# Patient Record
Sex: Female | Born: 1940 | Race: White | Hispanic: No | Marital: Married | Smoking: Former smoker
Health system: Southern US, Community
[De-identification: ages and names within clinical notes are randomized; demographics above are authoritative.]

## PROBLEM LIST (undated history)

## (undated) DIAGNOSIS — H01129 Discoid lupus erythematosus of unspecified eye, unspecified eyelid: Secondary | ICD-10-CM

## (undated) DIAGNOSIS — Z5189 Encounter for other specified aftercare: Secondary | ICD-10-CM

## (undated) DIAGNOSIS — IMO0002 Reserved for concepts with insufficient information to code with codable children: Secondary | ICD-10-CM

## (undated) DIAGNOSIS — T4145XA Adverse effect of unspecified anesthetic, initial encounter: Secondary | ICD-10-CM

## (undated) DIAGNOSIS — R51 Headache: Secondary | ICD-10-CM

## (undated) DIAGNOSIS — N289 Disorder of kidney and ureter, unspecified: Secondary | ICD-10-CM

## (undated) DIAGNOSIS — I10 Essential (primary) hypertension: Secondary | ICD-10-CM

## (undated) DIAGNOSIS — I509 Heart failure, unspecified: Secondary | ICD-10-CM

## (undated) DIAGNOSIS — I671 Cerebral aneurysm, nonruptured: Secondary | ICD-10-CM

## (undated) DIAGNOSIS — J189 Pneumonia, unspecified organism: Secondary | ICD-10-CM

## (undated) DIAGNOSIS — I499 Cardiac arrhythmia, unspecified: Secondary | ICD-10-CM

## (undated) DIAGNOSIS — E039 Hypothyroidism, unspecified: Secondary | ICD-10-CM

## (undated) DIAGNOSIS — E7800 Pure hypercholesterolemia, unspecified: Secondary | ICD-10-CM

## (undated) DIAGNOSIS — G4730 Sleep apnea, unspecified: Secondary | ICD-10-CM

## (undated) DIAGNOSIS — M329 Systemic lupus erythematosus, unspecified: Secondary | ICD-10-CM

## (undated) HISTORY — DX: Headache: R51

## (undated) HISTORY — PX: BACK SURGERY: SHX140

## (undated) HISTORY — DX: Encounter for other specified aftercare: Z51.89

## (undated) HISTORY — DX: Heart failure, unspecified: I50.9

## (undated) HISTORY — DX: Reserved for concepts with insufficient information to code with codable children: IMO0002

## (undated) HISTORY — DX: Discoid lupus erythematosus of unspecified eye, unspecified eyelid: H01.129

## (undated) HISTORY — DX: Cardiac arrhythmia, unspecified: I49.9

## (undated) HISTORY — DX: Cerebral aneurysm, nonruptured: I67.1

## (undated) HISTORY — DX: Sleep apnea, unspecified: G47.30

## (undated) HISTORY — DX: Pneumonia, unspecified organism: J18.9

## (undated) HISTORY — PX: TYMPANIC MEMBRANE REPAIR: SHX294

## (undated) HISTORY — PX: BREAST EXCISIONAL BIOPSY: SUR124

## (undated) HISTORY — DX: Systemic lupus erythematosus, unspecified: M32.9

## (undated) HISTORY — PX: NASAL SINUS SURGERY: SHX719

## (undated) HISTORY — DX: Adverse effect of unspecified anesthetic, initial encounter: T41.45XA

## (undated) HISTORY — DX: Essential (primary) hypertension: I10

## (undated) HISTORY — PX: EYE SURGERY: SHX253

## (undated) HISTORY — DX: Hypothyroidism, unspecified: E03.9

## (undated) HISTORY — PX: COSMETIC SURGERY: SHX468

## (undated) HISTORY — DX: Pure hypercholesterolemia, unspecified: E78.00

---
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
## 1973-08-21 HISTORY — PX: OTHER SURGICAL HISTORY: SHX169

## 1986-08-21 HISTORY — PX: ABDOMINAL HYSTERECTOMY: SHX81

## 1988-08-21 HISTORY — PX: EXCISION MORTON'S NEUROMA: SHX5013

## 1999-09-22 LAB — HM COLONOSCOPY: HM Colonoscopy: NORMAL

## 2003-08-22 HISTORY — PX: BRAIN SURGERY: SHX531

## 2006-08-21 HISTORY — PX: COLONOSCOPY: SHX174

## 2006-10-30 ENCOUNTER — Ambulatory Visit: Payer: Self-pay | Admitting: Pain Medicine

## 2006-11-02 ENCOUNTER — Emergency Department: Payer: Self-pay | Admitting: Unknown Physician Specialty

## 2006-11-29 ENCOUNTER — Ambulatory Visit: Payer: Self-pay | Admitting: Specialist

## 2006-12-03 LAB — HM COLONOSCOPY: HM Colonoscopy: NORMAL

## 2007-02-07 ENCOUNTER — Ambulatory Visit: Payer: Self-pay | Admitting: Specialist

## 2007-06-06 ENCOUNTER — Ambulatory Visit (HOSPITAL_COMMUNITY): Admission: RE | Admit: 2007-06-06 | Discharge: 2007-06-06 | Payer: Self-pay | Admitting: Neurosurgery

## 2007-08-08 IMAGING — CR DG ANKLE COMPLETE 3+V*L*
1 series · 5 of 5 positions shown · non-contrast
Comparison: none

REASON FOR EXAM: Pain after fall - Call results to [phone] [REDACTED]
COMMENTS:

PROCEDURE:     DXR - DXR ANKLE LEFT COMPLETE  - November 29, 2006  [DATE]
RESULT:     Compared to a prior exam of 11/02/2006, no significant interval
changes are seen. No fracture, dislocation, or other acute bony abnormality
is identified. The ankle mortise is well maintained.

[Series 1: view not recorded · 0.17mm/px · 5 of 5 slices shown]
[im 1/5]
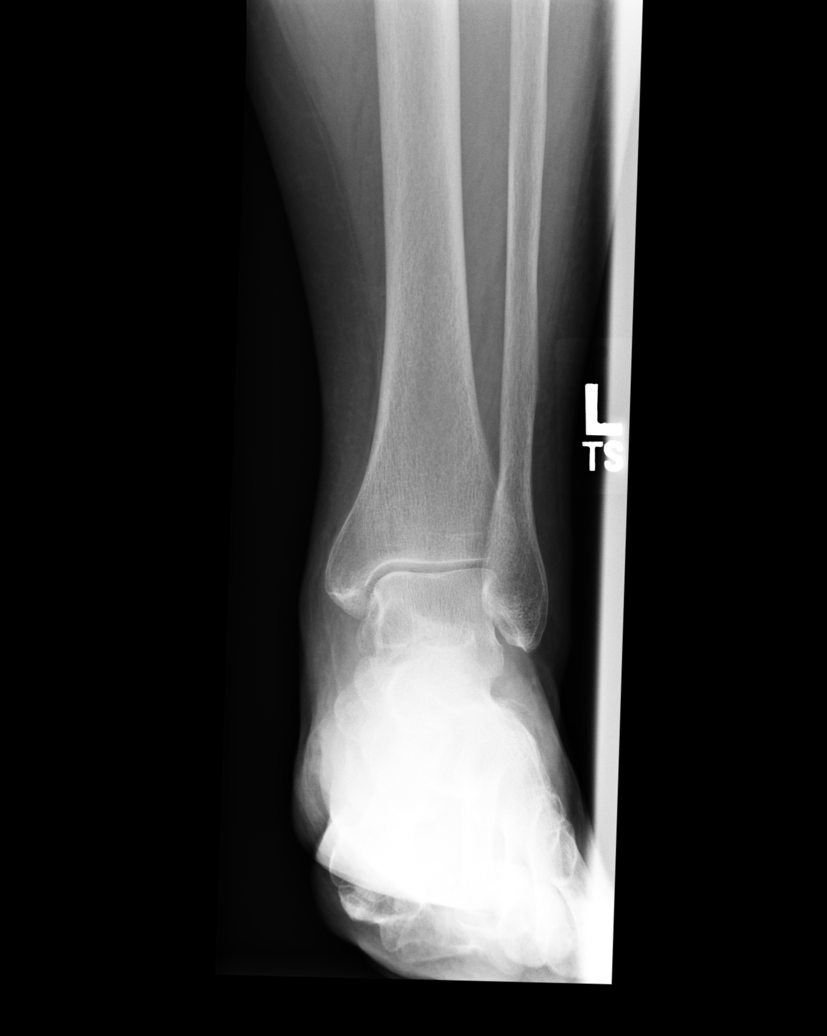
[im 2/5]
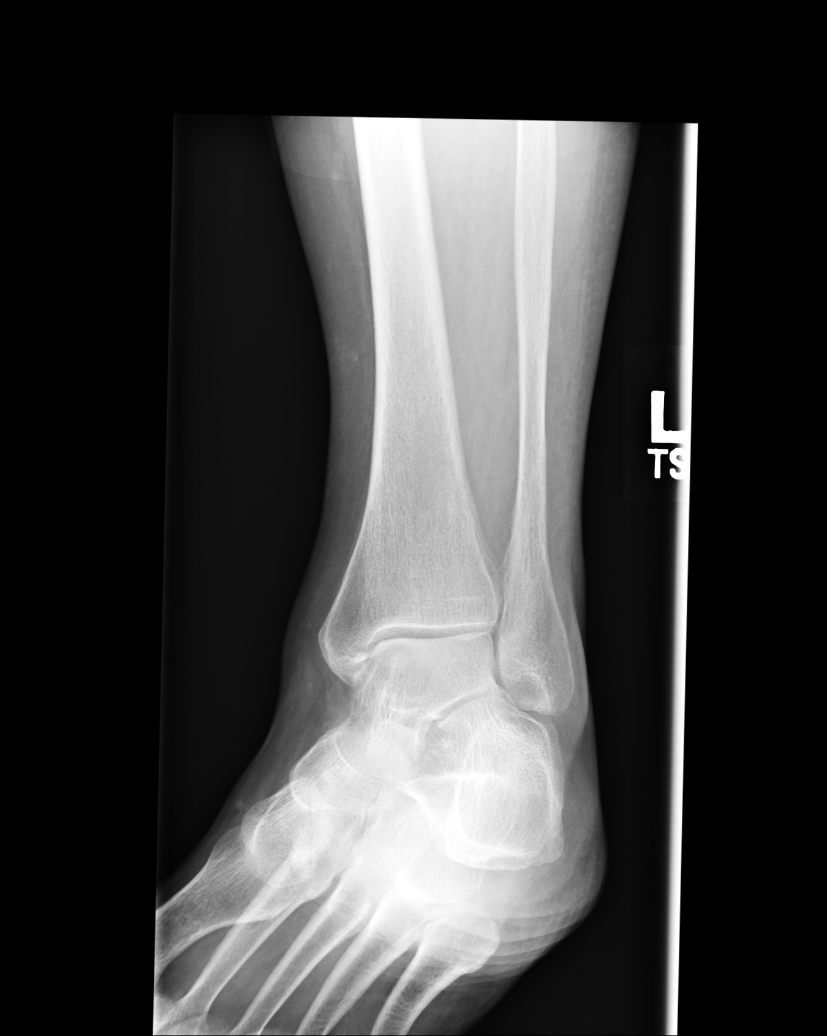
[im 3/5]
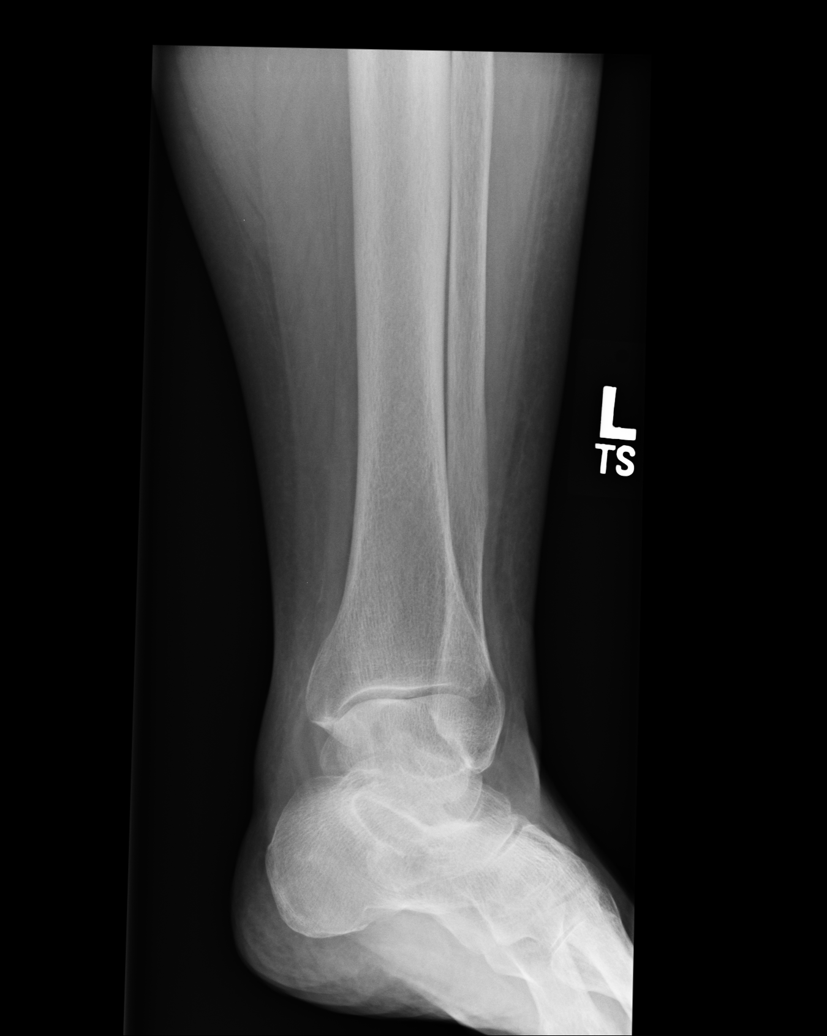
[im 4/5]
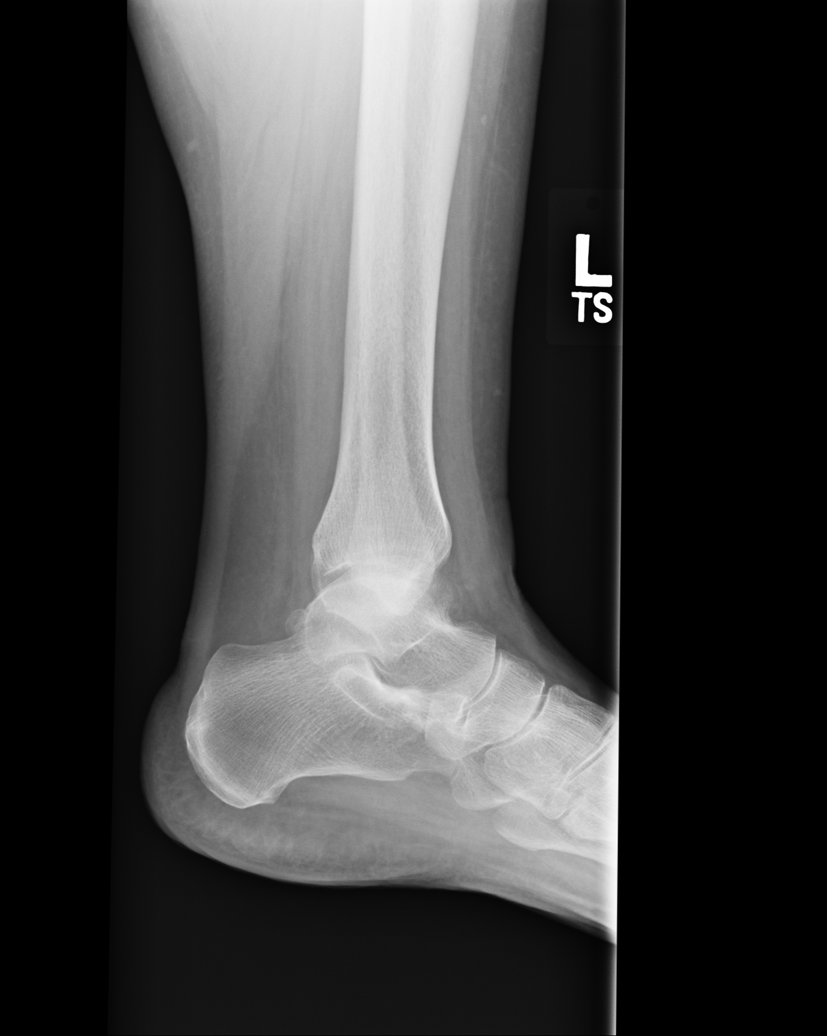
[im 5/5]
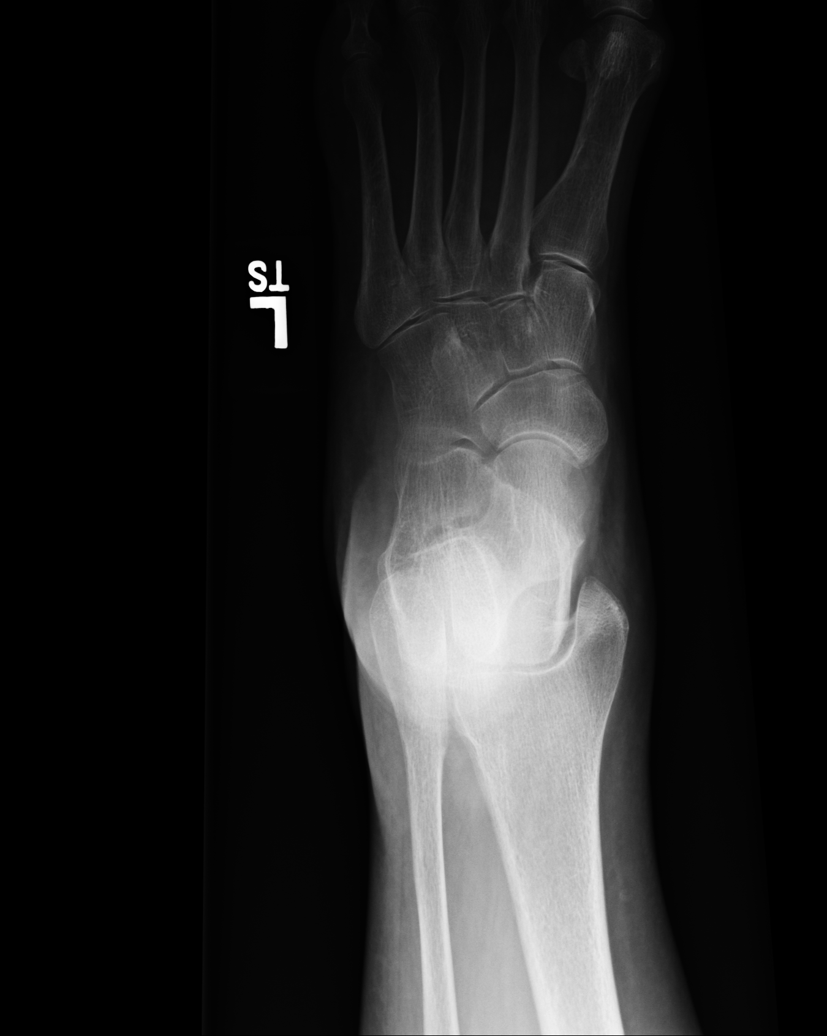

[5 of 5 positions shown; findings below may reference images not displayed]

IMPRESSION: 1.     No significant osseous abnormalities are identified.

## 2007-08-22 DIAGNOSIS — IMO0001 Reserved for inherently not codable concepts without codable children: Secondary | ICD-10-CM

## 2007-08-22 DIAGNOSIS — Z5189 Encounter for other specified aftercare: Secondary | ICD-10-CM

## 2007-08-22 HISTORY — DX: Encounter for other specified aftercare: Z51.89

## 2007-08-22 HISTORY — DX: Reserved for inherently not codable concepts without codable children: IMO0001

## 2007-11-07 DIAGNOSIS — E7800 Pure hypercholesterolemia, unspecified: Secondary | ICD-10-CM | POA: Insufficient documentation

## 2007-11-28 ENCOUNTER — Inpatient Hospital Stay (HOSPITAL_COMMUNITY): Admission: RE | Admit: 2007-11-28 | Discharge: 2007-12-03 | Payer: Self-pay | Admitting: Neurosurgery

## 2007-12-03 ENCOUNTER — Ambulatory Visit: Payer: Self-pay | Admitting: Surgery

## 2007-12-03 ENCOUNTER — Encounter (INDEPENDENT_AMBULATORY_CARE_PROVIDER_SITE_OTHER): Payer: Self-pay | Admitting: Neurosurgery

## 2008-02-18 ENCOUNTER — Observation Stay: Payer: Self-pay | Admitting: Specialist

## 2008-10-09 ENCOUNTER — Ambulatory Visit: Payer: Self-pay | Admitting: Family

## 2009-04-08 ENCOUNTER — Ambulatory Visit: Payer: Self-pay | Admitting: Family

## 2010-04-06 ENCOUNTER — Ambulatory Visit: Payer: Self-pay | Admitting: Internal Medicine

## 2010-05-21 DIAGNOSIS — I509 Heart failure, unspecified: Secondary | ICD-10-CM

## 2010-05-21 HISTORY — DX: Heart failure, unspecified: I50.9

## 2010-05-30 DIAGNOSIS — T8859XA Other complications of anesthesia, initial encounter: Secondary | ICD-10-CM

## 2010-05-30 DIAGNOSIS — J189 Pneumonia, unspecified organism: Secondary | ICD-10-CM

## 2010-05-30 HISTORY — DX: Pneumonia, unspecified organism: J18.9

## 2010-05-30 HISTORY — DX: Other complications of anesthesia, initial encounter: T88.59XA

## 2010-06-29 ENCOUNTER — Inpatient Hospital Stay: Payer: Self-pay | Admitting: Internal Medicine

## 2010-09-23 ENCOUNTER — Ambulatory Visit: Payer: Self-pay | Admitting: Specialist

## 2010-10-18 ENCOUNTER — Encounter: Payer: Self-pay | Admitting: Internal Medicine

## 2010-10-20 ENCOUNTER — Encounter: Payer: Self-pay | Admitting: Internal Medicine

## 2011-01-03 NOTE — Op Note (Signed)
[name], [name]                   ACCOUNT NO.:  [id number]   MEDICAL RECORD NO.:  [id number]          PATIENT TYPE:  INP   LOCATION:  3308                         FACILITY:  [facility]   PHYSICIAN:  Cristi Loron, M.D.DATE OF BIRTH:  1941-06-01   DATE OF PROCEDURE:  11/28/2007  DATE OF DISCHARGE:                               OPERATIVE REPORT   BRIEF HISTORY:  The patient is a 70 year old white female who has  undergone L3-4 and L4-5 posterior decompression, instrumentation, and  fusion by another physician several years ago.  The patient has had  chronic back pain.  She had worsening of her pain with right leg pain.  She failed medical management and was worked up with lumbar x-rays and  myelo-CT, which demonstrated the patient had likely developed  pseudoarthrosis at L3-4 and L4-5 and then developed adjacent segment  degenerative changes at L2-3 with stenosis.  I discussed various  treatment options with the patient including surgery.  The patient has  weighed the risks, benefits, and alternatives of surgery and decided to  proceed with lumbar decompression, instrumentation, and fusion and  exploration of her arthrodesis.   PREOPERATIVE DIAGNOSES:  1. L3-4, L4-5 disk degeneration and stenosis.  2. Lumbar radiculopathy.  3. Lumbago.  4. Possible pseudoarthrosis.   POSTOPERATIVE DIAGNOSES:  1. L3-4, L4-5 disk degeneration and stenosis.  2. Lumbar radiculopathy.  3. Lumbago.  4. Possible pseudoarthrosis.   PROCEDURE:  Exploration of lumbar arthrodesis; redo decompressive  laminectomy at L2, L3, and L4 to decompress the right L2, L3, L4, and L5  nerve roots; right L2-3, L3-4, and L4-5 and transforaminal lumbar  interbody fusion with local autograft bone and VITOSS bone graft  extender; insertion of L2-3, L3-4, L4-5 interbody prosthesis (Capstone  PEEK interbody prosthesis); posterior segmental instrumentation at L2 to  L5 with Legacy titanium pedicle screws and rods; L2-3,  L3-4, and L4-5  posterolateral arthrodesis with local morselized autograft bone, VITOSS  bone graft extender as well as bone morphogenic protein-soaked collagen  sponges.   SURGEON:  Cristi Loron, M.D.   ASSISTANT:  Clydene Fake, M.D.   ANESTHESIA:  General endotracheal.   ESTIMATED BLOOD LOSS:  500 mL.   SPECIMENS:  None.   DRAINS:  None.   COMPLICATIONS:  None.   DESCRIPTION OF PROCEDURE:  The patient was brought to the operating room  by the anesthesia team.  General endotracheal anesthesia was induced.  The patient was turned to the prone position on the Wilson frame.  Her  lumbosacral region was then prepared with Betadine scrub and Betadine  solution.  Sterile drapes were applied.  I then injected the area to be  incised with Marcaine with epinephrine solution.  I used a scalpel to  make a linear midline incision through the patient's previous surgical  scar.  I used electrocautery to perform a bilateral subperiosteal  dissection exposing spinous process and lamina of L2 in the upper sacrum  as well as exposing the old hardware.  We then used the universal  removal set to remove the old synergy pedicle screws and  rods.  Of note,  the screws at L3 and at L5 were very loose.  The L4 screws were only  minimally loose.  Clearly, the patient has pseudoarthrosis of both  levels.   We now turned attention to the decompression.  I used a high-speed drill  to perform a right L2 laminotomy.  We then widened the laminotomy with  Kerrison punch removing the right L2-3 ligamentum flavum.  We carefully  dissected through the epidural scar tissue from prior operation and  removed the epidural scar tissue and then decompressed the thecal sac.  Because the patient had persistent right leg pain, we then performed  foraminotomies about the right L2, L3, L4, and L5 nerve roots.  Of note,  the extent of decompression was greater than which required to do the  interbody fusion.  We  did encounter the expected foraminal stenosis.   Having completed the decompression, we now turned our attention to  arthrodesis.  We incised the L2-3, L3-4, and L4-5 intervertebral disk  with a scalpel .  We performed partial intervertebral diskectomy with  the pituitary forceps.  We prepared the vertebral endplates for the  interbody fusion using the curettes.  We then used trial spacers and  determined to use a 12 x 26-mm interbody spacer at L2-3 and L4-5, a 10 x  26 at L3-4.  We prefilled the spacer with a combination of a BMP-soaked  collagen sponge, VITOSS bone graft extender as well as local autograft  bone.  We used these substances to fill in the ventral disk space as  well.  We placed prosthesis into the disk space on the right at L2-3, L3-  4, and L4-5, of course after retracting the neural structures out of  harm's way.  We used a tamp to turn the prosthesis laterally at L2-3 and  L4-5 before the prosthesis was too snug to turn in laterally.  We then  filled posterior disk space with VITOSS local autograft bone and bone  morphogenic protein-soaked collagen sponges completing the  transforaminal lumbar fusion and interbody fusion at L2-3, L3-4, and L4-  5.   We now turned our attention to the instrumentation.  Under fluoroscopic  guidance, we cannulated bilateral L2 pedicles with bone probe.  We  tapped the pedicles with 5.5-mm tap and then inserted 7.5 x 55-mm tap  screws bilaterally at L2.  We then reinserted 8.5 x 55 mm tap screws  bilaterally at L3 and L5 and 7.5 x 55 bilaterally at L4.  We got good  purchase in the old screw holes.  We then connected the unilateral  screws with a lordotic rod.  We expressed the rod in place using the  capsular tightening probe which we tightened appropriately, and then we  placed across the connector between the rods again tightening it  appropriately to complete the instrumentation.   We now turned our attention to the posterolateral  arthrodesis.  We used  a high-speed drill to decorticate the remainder of the L2-3, L3-4, and  L4-5 facets at pars region and transverse processes.  We laid a  combination of local autograft bone, VITOSS, and bone morphogenic  protein-soaked collagen sponge over these corticated posterior  structures completing the posterolateral arthrodesis.   We then obtained hemostasis using bipolar cautery.  We inspected the  right thecal sac at L2-3, L3-4, and L4-5 as well as palpated along the  exit route of the right L2, L3, L4, and L5 nerve roots and noted they  were well decompressed.  We then removed the retractor and then  reapproximated the patient's thoracolumbar fascia with interrupted #1  Vicryl suture, subcutaneous tissue with interrupted 2-0 Vicryl suture,  and skin with Steri-Strips and benzoin.  The wound was then coated with  Bacitracin ointment.  Sterile dressings applied.  Drapes were removed.  The patient was subsequently returned to supine position where she was  extubated by the anesthesia team and transported to the postanesthesia  care unit in stable condition.  All sponge, instrument and needle counts  were correct in this case.      Cristi Loron, M.D.  Electronically Signed     JDJ/MEDQ  D:  11/28/2007  T:  11/29/2007  Job:  [id number]

## 2011-01-06 NOTE — Discharge Summary (Signed)
[name], [name]                   ACCOUNT NO.:  [id number]   MEDICAL RECORD NO.:  [id number]          PATIENT TYPE:  INP   LOCATION:  3009                         FACILITY:  [facility]   PHYSICIAN:  Cristi Loron, M.D.DATE OF BIRTH:  1940/11/03   DATE OF ADMISSION:  11/28/2007  DATE OF DISCHARGE:  12/03/2007                               DISCHARGE SUMMARY   BRIEF HISTORY:  The patient is a 70 year old white female who has  undergone an L3-4, L4-5 posterior decompression, instrumentation, and  fusion by another physician several years ago.  The patient has had  chronic back pain.  She has had worsening of her back pain with right  leg pain.  She failed medical management and was worked up with lumbar x-  rays and myelo-CT demonstrated the patient had developed pseudoarthrosis  at L3-4 and L4-5, and that she had developed adjacent secondary  degenerative changes at L2-3 with stenosis.  I discussed the various  treatment options with the patient including the surgery.  The patient  is aware of the risks, benefits, and alternatives to surgery, and  decided to proceed with a lumbar decompression, instrumentation, fusion,  and exploration of arthrodesis.   For further details of this admission, please refer to typed history and  physical.   HOSPITAL COURSE:  I admitted the patient to [facility] on November 28, 2007.  On the day of admission, I performed exploration of the lumbar  fusion with a L2-3, L3-4, L4-5 decompression, instrumentation, and  fusion.  Surgery went well (for full details of this operation, please  refer to the typed operative note.   POSTOPERATIVE COURSE:  The patient's postoperative course was  unremarkable.  Given history of DVTs, before the patient was discharged,  she had a bilateral lower extremity ultrasound which was negative for  DVT.  The patient was discharged to home on December 03, 2007.   DISCHARGE INSTRUCTIONS:  This patient was given written  discharge  instructions.  Instructed to follow up with me in 4 weeks.   FINAL DIAGNOSES:  1. 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  2. L2-L3 disc degeneration.  3. Stenosis.  4. Lumbar radiculopathy.  5. Lumbago.   PROCEDURE PERFORMED:  Exploration of lumbar arthrodesis; redo  decompressive laminectomy at L2, L3, and L4 to decompress the right L2,  L3, L4, and L5 nerve roots; right L2-3, L3-4, and L4-5 transforaminal  lumbar interbody fusion with local autograft bone and Vitoss bone graft  extender; insertion of L2-3, L3-4, L4-5 interbody  prosthesis (Capstone PEEK interbody prosthesis); posterior segmental  instrumentation at L2-L5 with Legacy titanium pedicle screws and rods;  L2-3, L3-4, and L4-5 posterolateral arthrodesis with local morselized  autograft bone and Vitoss bone graft extender, as well as bone  morphogenic protein soaked collagen sponges.      Cristi Loron, M.D.  Electronically Signed     JDJ/MEDQ  D:  12/26/2007  T:  12/27/2007  Job:  [id number]

## 2011-02-15 ENCOUNTER — Ambulatory Visit: Payer: Self-pay | Admitting: Internal Medicine

## 2011-02-28 ENCOUNTER — Ambulatory Visit: Payer: Self-pay | Admitting: Internal Medicine

## 2011-03-28 ENCOUNTER — Encounter: Payer: Self-pay | Admitting: Internal Medicine

## 2011-04-07 ENCOUNTER — Encounter: Payer: Self-pay | Admitting: Internal Medicine

## 2011-04-07 DIAGNOSIS — I10 Essential (primary) hypertension: Secondary | ICD-10-CM

## 2011-04-18 ENCOUNTER — Ambulatory Visit (INDEPENDENT_AMBULATORY_CARE_PROVIDER_SITE_OTHER): Payer: Self-pay | Admitting: Internal Medicine

## 2011-04-18 ENCOUNTER — Encounter: Payer: Self-pay | Admitting: Internal Medicine

## 2011-04-18 DIAGNOSIS — I10 Essential (primary) hypertension: Secondary | ICD-10-CM

## 2011-04-18 DIAGNOSIS — G8929 Other chronic pain: Secondary | ICD-10-CM

## 2011-04-18 DIAGNOSIS — R7309 Other abnormal glucose: Secondary | ICD-10-CM

## 2011-04-18 DIAGNOSIS — Z1239 Encounter for other screening for malignant neoplasm of breast: Secondary | ICD-10-CM | POA: Insufficient documentation

## 2011-04-18 DIAGNOSIS — M549 Dorsalgia, unspecified: Secondary | ICD-10-CM

## 2011-04-18 DIAGNOSIS — IMO0001 Reserved for inherently not codable concepts without codable children: Secondary | ICD-10-CM | POA: Insufficient documentation

## 2011-04-18 DIAGNOSIS — R739 Hyperglycemia, unspecified: Secondary | ICD-10-CM

## 2011-04-18 DIAGNOSIS — I671 Cerebral aneurysm, nonruptured: Secondary | ICD-10-CM | POA: Insufficient documentation

## 2011-04-18 DIAGNOSIS — R252 Cramp and spasm: Secondary | ICD-10-CM

## 2011-04-18 LAB — COMPREHENSIVE METABOLIC PANEL
Chloride: 106 mEq/L (ref 96–112)
Creatinine, Ser: 1.1 mg/dL (ref 0.4–1.2)
GFR: 55.36 mL/min — ABNORMAL LOW (ref >60.00)
Sodium: 140 mEq/L (ref 135–145)

## 2011-04-18 LAB — MAGNESIUM: Magnesium: 2 mg/dL (ref 1.5–2.5)

## 2011-04-18 LAB — HEMOGLOBIN A1C: Hgb A1c MFr Bld: 5.8 % (ref 4.6–6.5)

## 2011-04-18 MED ORDER — OXYCODONE-ACETAMINOPHEN 10-325 MG PO TABS: 1.0000 | ORAL_TABLET | Freq: Four times a day (QID) | ORAL | Status: DC | PRN

## 2011-04-18 NOTE — Assessment & Plan Note (Signed)
Continue Percocet for pain control.

## 2011-04-18 NOTE — Patient Instructions (Signed)
Your blood pressure is perfect. You can reduce your hydralazine to 50 mg if your blood pressure remains  < 130/80

## 2011-04-18 NOTE — Progress Notes (Signed)
  Subjective:    Patient ID: [name], female    DOB: 14-Nov-1940, 70 y.o.   MRN: [id number]  HPI Here for a blood pressure check.  Has weaned herself off of Catapres and bps are now runnign < 130/80 consistently on hydralazine 100 tid, losartan 100 qd, lopressor 100 bid and spironolactone    Review of Systems  Constitutional: Negative for fever, chills and unexpected weight change.  HENT: Negative for hearing loss, ear pain, nosebleeds, congestion, sore throat, facial swelling, rhinorrhea, sneezing, mouth sores, trouble swallowing, neck pain, neck stiffness, voice change, postnasal drip, sinus pressure, tinnitus and ear discharge.   Eyes: Negative for pain, discharge, redness and visual disturbance.  Respiratory: Negative for cough, chest tightness, shortness of breath, wheezing and stridor.   Cardiovascular: Negative for chest pain, palpitations and leg swelling.  Musculoskeletal: Negative for myalgias and arthralgias.  Skin: Negative for color change and rash.  Neurological: Negative for dizziness, weakness, light-headedness and headaches.  Hematological: Negative for adenopathy.       Objective:   Physical Exam  Constitutional: She is oriented to person, place, and time. She appears well-developed and well-nourished.  HENT:  Mouth/Throat: Oropharynx is clear and moist.  Eyes: EOM are normal. Pupils are equal, round, and reactive to light. No scleral icterus.  Neck: Normal range of motion. Neck supple. No JVD present. No thyromegaly present.  Cardiovascular: Normal rate, regular rhythm, normal heart sounds and intact distal pulses.   Pulmonary/Chest: Effort normal and breath sounds normal.  Abdominal: Soft. Bowel sounds are normal. She exhibits no mass. There is no tenderness.  Musculoskeletal: Normal range of motion. She exhibits no edema.  Lymphadenopathy:    She has no cervical adenopathy.  Neurological: She is alert and oriented to person, place, and time.  Skin: Skin is  warm and dry.  Psychiatric: She has a normal mood and affect.          Assessment & Plan:

## 2011-04-18 NOTE — Assessment & Plan Note (Signed)
Currently well controlled on hydralazine, metoprolol, losartan and spironolactone.  The hydralazine is causing increased fluid retention which is aggravating her lymphedema .  Will try to reduce the dose if possible without disturbing control of bp.

## 2011-04-19 ENCOUNTER — Encounter: Payer: Self-pay | Admitting: Internal Medicine

## 2011-04-19 ENCOUNTER — Other Ambulatory Visit: Payer: Self-pay | Admitting: Internal Medicine

## 2011-04-19 MED ORDER — ROSUVASTATIN CALCIUM 20 MG PO TABS: 20.0000 mg | ORAL_TABLET | Freq: Every day | ORAL | Status: DC

## 2011-05-09 ENCOUNTER — Other Ambulatory Visit: Payer: Self-pay | Admitting: Internal Medicine

## 2011-05-10 ENCOUNTER — Other Ambulatory Visit: Payer: Self-pay | Admitting: Internal Medicine

## 2011-05-10 MED ORDER — LOSARTAN POTASSIUM 100 MG PO TABS: 100.0000 mg | ORAL_TABLET | Freq: Every day | ORAL | Status: DC

## 2011-05-12 MED ORDER — LOSARTAN POTASSIUM 100 MG PO TABS: 100.0000 mg | ORAL_TABLET | Freq: Every day | ORAL | Status: DC

## 2011-05-15 ENCOUNTER — Other Ambulatory Visit: Payer: Self-pay | Admitting: Internal Medicine

## 2011-05-15 DIAGNOSIS — Z1239 Encounter for other screening for malignant neoplasm of breast: Secondary | ICD-10-CM

## 2011-05-16 LAB — CBC
HCT: 29.7 — ABNORMAL LOW
HCT: 36.9
Hemoglobin: 12.7
MCHC: 34.1
MCHC: 34.5
MCV: 95.7
MCV: 96.6
Platelets: 143 — ABNORMAL LOW
RBC: 3.08 — ABNORMAL LOW
RBC: 3.85 — ABNORMAL LOW

## 2011-05-16 LAB — BASIC METABOLIC PANEL
BUN: 11
CO2: 27
CO2: 29
Chloride: 103
Chloride: 104
Creatinine, Ser: 0.68
Creatinine, Ser: 0.9
GFR calc Af Amer: >60
Potassium: 4
Potassium: 4.2

## 2011-05-16 LAB — POCT I-STAT 4, (NA,K, GLUC, HGB,HCT)
Potassium: 3.8
Sodium: 142

## 2011-05-16 LAB — URINALYSIS, ROUTINE W REFLEX MICROSCOPIC
Bilirubin Urine: NEGATIVE
Glucose, UA: NEGATIVE
Hgb urine dipstick: NEGATIVE
Ketones, ur: NEGATIVE
Protein, ur: NEGATIVE

## 2011-05-16 LAB — URINE MICROSCOPIC-ADD ON

## 2011-05-16 LAB — TYPE AND SCREEN: Antibody Screen: NEGATIVE

## 2011-05-16 LAB — ABO/RH: ABO/RH(D): A POS

## 2011-05-18 ENCOUNTER — Other Ambulatory Visit: Payer: Self-pay | Admitting: Internal Medicine

## 2011-05-18 DIAGNOSIS — N952 Postmenopausal atrophic vaginitis: Secondary | ICD-10-CM

## 2011-05-18 DIAGNOSIS — Z1239 Encounter for other screening for malignant neoplasm of breast: Secondary | ICD-10-CM

## 2011-05-18 MED ORDER — ESTRADIOL 0.1 MG/GM VA CREA: 1.0000 g | TOPICAL_CREAM | Freq: Every day | VAGINAL | Status: DC

## 2011-05-19 ENCOUNTER — Other Ambulatory Visit: Payer: Self-pay | Admitting: Internal Medicine

## 2011-05-22 ENCOUNTER — Telehealth: Payer: Self-pay | Admitting: Internal Medicine

## 2011-05-22 MED ORDER — ZOLPIDEM TARTRATE 5 MG PO TABS: 5.0000 mg | ORAL_TABLET | Freq: Every evening | ORAL | Status: DC | PRN

## 2011-05-22 NOTE — Telephone Encounter (Signed)
Pt received call from pharmacy  [facility]  [phone]  That her rx is ready for pick up.  She received estrace for 0.01 % cream.  She has never taken this and never discuss with dr Darrick Huntsman.  Please advise patient

## 2011-05-24 ENCOUNTER — Other Ambulatory Visit: Payer: Self-pay | Admitting: Internal Medicine

## 2011-05-24 NOTE — Telephone Encounter (Signed)
Notified patient Rx was a mistake and I or Dr. Darrick Huntsman wasn't sure why it was prescribed.  Rx has been discontinued.

## 2011-05-26 ENCOUNTER — Encounter: Payer: Self-pay | Admitting: Internal Medicine

## 2011-05-26 ENCOUNTER — Ambulatory Visit (INDEPENDENT_AMBULATORY_CARE_PROVIDER_SITE_OTHER): Payer: Self-pay | Admitting: Internal Medicine

## 2011-05-26 VITALS — BP 104/40 | HR 57 | Temp 98.1°F | Resp 16 | Ht 62.5 in | Wt 158.5 lb

## 2011-05-26 DIAGNOSIS — G8929 Other chronic pain: Secondary | ICD-10-CM

## 2011-05-26 DIAGNOSIS — I10 Essential (primary) hypertension: Secondary | ICD-10-CM

## 2011-05-26 DIAGNOSIS — M549 Dorsalgia, unspecified: Secondary | ICD-10-CM

## 2011-05-26 MED ORDER — ZOLPIDEM TARTRATE 5 MG PO TABS: 5.0000 mg | ORAL_TABLET | Freq: Every evening | ORAL | Status: DC | PRN

## 2011-05-26 MED ORDER — OXYCODONE-ACETAMINOPHEN 10-325 MG PO TABS: 1.0000 | ORAL_TABLET | Freq: Four times a day (QID) | ORAL | Status: DC | PRN

## 2011-05-26 NOTE — Patient Instructions (Signed)
Your blood pressure is excellent. If you start feeling sluggish, reduce the Lopressor to half tablet twice daily .  Continue all of your other medications.   You can stop the singulair OR the clarinex if you want to for your allergies/congestion

## 2011-05-27 ENCOUNTER — Encounter: Payer: Self-pay | Admitting: Internal Medicine

## 2011-05-27 NOTE — Progress Notes (Signed)
Subjective:    Patient ID: [name], female    DOB: 1940-12-27, 70 y.o.   MRN: [id number]  [name] is a 70 yo white female with a history of uncontrolled hypertension , COPD, and chronic back pain due to spinal stenosis who presents 15 minutes late for her scheduled followup on chronic issues, in light of recent medication changes.  In past visits clonidine was tapered to off and she has had no adverse effects.  In fact her blood pressures have been much better controlled, with systolics usually under 140 and often in the 120's.  She fees fine,  Denies orthostasis and fatigue.   Past Medical History  Diagnosis Date  . Degenerative disc disease     HERNITAED DISC BY MRI ON 07/07;S/P LAMINECTOMY,RID REPLACEMENT  . Brain aneurysm     S/P RESECTION  . Hypertension   . Discoid lupus erythematosus eyelid     NEGATIVE SEROLOGIC MARKERS  . Hypercholesterolemia   . Chronic sinusitis    Current Outpatient Prescriptions on File Prior to Visit  Medication Sig Dispense Refill  . benzonatate (TESSALON) 100 MG capsule Take 100 mg by mouth 3 (three) times daily as needed.        . budesonide-formoterol (SYMBICORT) 80-4.5 MCG/ACT inhaler Inhale 2 puffs into the lungs 2 (two) times daily.        . calcium citrate-vitamin D (CITRACAL+D) 315-200 MG-UNIT per tablet Take 1 tablet by mouth at bedtime. TAKE 2....SPECIFIC DOSE UNKNOWN        . desloratadine (CLARINEX) 5 MG tablet Take 5 mg by mouth every morning.        . diazepam (VALIUM) 5 MG tablet Take 5 mg by mouth 2 (two) times daily as needed.        . doxepin (SINEQUAN) 25 MG capsule Take 25 mg by mouth at bedtime. TAKE 2        . escitalopram (LEXAPRO) 10 MG tablet Take 10 mg by mouth daily.        . furosemide (LASIX) 20 MG tablet Take 20 mg by mouth daily.        . hydrALAZINE (APRESOLINE) 100 MG tablet Take 50 mg by mouth 2 (two) times daily.       . isosorbide mononitrate (IMDUR) 30 MG 24 hr tablet Take 30 mg by mouth daily. TAKE 2          . levothyroxine (SYNTHROID, LEVOTHROID) 137 MCG tablet Take 137 mcg by mouth daily.        Marland Kitchen lidocaine (LIDODERM) 5 % Place 1 patch onto the skin daily. Remove & Discard patch within 12 hours or as directed by MD       . losartan (COZAAR) 100 MG tablet Take 1 tablet (100 mg total) by mouth daily.  30 tablet  3  . Lutein 6 MG CAPS Take by mouth.        . metoprolol (LOPRESSOR) 100 MG tablet Take 100 mg by mouth 2 (two) times daily.        . potassium chloride (KLOR-CON 10) 10 MEQ CR tablet Take 10 mEq by mouth. TAKE 3 ORAL QD       . Prenatal Vit-Fe Fumarate-FA (PRENAPLUS) 27-1 MG TABS Take by mouth daily.        . rosuvastatin (CRESTOR) 20 MG tablet Take 1 tablet (20 mg total) by mouth daily.  30 tablet  6  . spironolactone (ALDACTONE) 50 MG tablet Take 50 mg by mouth daily.        Marland Kitchen  triamcinolone (KENALOG) 0.025 % ointment Apply 1 application topically 2 (two) times daily.        . vitamin C (ASCORBIC ACID) 500 MG tablet Take 500 mg by mouth daily. TAKE 2        . zolpidem (AMBIEN) 5 MG tablet Take 1 tablet (5 mg total) by mouth at bedtime as needed.  30 tablet  3    Review of Systems  Constitutional: Negative for fever, chills and unexpected weight change.  HENT: Negative for hearing loss, ear pain, nosebleeds, congestion, sore throat, facial swelling, rhinorrhea, sneezing, mouth sores, trouble swallowing, neck pain, neck stiffness, voice change, postnasal drip, sinus pressure, tinnitus and ear discharge.   Eyes: Negative for pain, discharge, redness and visual disturbance.  Respiratory: Negative for cough, chest tightness, shortness of breath, wheezing and stridor.   Cardiovascular: Negative for chest pain, palpitations and leg swelling.  Musculoskeletal: Negative for myalgias and arthralgias.  Skin: Negative for color change and rash.  Neurological: Negative for dizziness, weakness, light-headedness and headaches.  Hematological: Negative for adenopathy.       Objective:   Physical  Exam  Constitutional: She is oriented to person, place, and time. She appears well-developed and well-nourished.  HENT:  Mouth/Throat: Oropharynx is clear and moist.  Eyes: EOM are normal. Pupils are equal, round, and reactive to light. No scleral icterus.  Neck: Normal range of motion. Neck supple. No JVD present. No thyromegaly present.  Cardiovascular: Normal rate, regular rhythm, normal heart sounds and intact distal pulses.   Pulmonary/Chest: Effort normal and breath sounds normal.  Abdominal: Soft. Bowel sounds are normal. She exhibits no mass. There is no tenderness.  Musculoskeletal: Normal range of motion. She exhibits no edema.  Lymphadenopathy:    She has no cervical adenopathy.  Neurological: She is alert and oriented to person, place, and time.  Skin: Skin is warm and dry.  Psychiatric: She has a normal mood and affect.          Assessment & Plan:

## 2011-05-27 NOTE — Assessment & Plan Note (Signed)
improved control on current regimen without use of clonidine at all.  No changes today.

## 2011-05-27 NOTE — Assessment & Plan Note (Signed)
I will continue to manage her pain with monthly prescriptions of percocet.  Her pain is currently controlled and there have been no escalations of narcotic use.

## 2011-06-01 DIAGNOSIS — H9190 Unspecified hearing loss, unspecified ear: Secondary | ICD-10-CM | POA: Insufficient documentation

## 2011-06-01 DIAGNOSIS — J329 Chronic sinusitis, unspecified: Secondary | ICD-10-CM | POA: Insufficient documentation

## 2011-06-04 ENCOUNTER — Other Ambulatory Visit: Payer: Self-pay | Admitting: Internal Medicine

## 2011-06-12 ENCOUNTER — Other Ambulatory Visit: Payer: Self-pay | Admitting: Internal Medicine

## 2011-06-20 ENCOUNTER — Other Ambulatory Visit: Payer: Self-pay | Admitting: Internal Medicine

## 2011-06-20 MED ORDER — DIAZEPAM 5 MG PO TABS: 5.0000 mg | ORAL_TABLET | Freq: Two times a day (BID) | ORAL | Status: DC | PRN

## 2011-06-21 ENCOUNTER — Ambulatory Visit: Payer: Self-pay | Admitting: Internal Medicine

## 2011-06-26 ENCOUNTER — Ambulatory Visit (INDEPENDENT_AMBULATORY_CARE_PROVIDER_SITE_OTHER)
Admission: RE | Admit: 2011-06-26 | Discharge: 2011-06-26 | Disposition: A | Discharge status: Home / Self Care | Payer: Federal, State, Local not specified - PPO | Source: Ambulatory Visit | Attending: Internal Medicine | Admitting: Internal Medicine

## 2011-06-26 ENCOUNTER — Ambulatory Visit (INDEPENDENT_AMBULATORY_CARE_PROVIDER_SITE_OTHER): Payer: Federal, State, Local not specified - PPO | Admitting: Internal Medicine

## 2011-06-26 ENCOUNTER — Encounter: Payer: Self-pay | Admitting: Internal Medicine

## 2011-06-26 ENCOUNTER — Encounter (HOSPITAL_COMMUNITY): Payer: Self-pay | Admitting: Pharmacy Technician

## 2011-06-26 ENCOUNTER — Other Ambulatory Visit: Payer: Self-pay | Admitting: Internal Medicine

## 2011-06-26 VITALS — BP 116/52 | HR 49 | Temp 97.6°F | Resp 16 | Ht 62.5 in | Wt 213.0 lb

## 2011-06-26 DIAGNOSIS — I10 Essential (primary) hypertension: Secondary | ICD-10-CM

## 2011-06-26 DIAGNOSIS — M549 Dorsalgia, unspecified: Secondary | ICD-10-CM

## 2011-06-26 DIAGNOSIS — Z01818 Encounter for other preprocedural examination: Secondary | ICD-10-CM

## 2011-06-26 DIAGNOSIS — G8929 Other chronic pain: Secondary | ICD-10-CM

## 2011-06-26 NOTE — Progress Notes (Signed)
Subjective:    Patient ID: [name], female    DOB: 09/29/1940, 70 y.o.   MRN: [id number]  HPI  70 yo white female with a complicated medical history who is  here for preoperative evaluation in anticipation of  Dr. Merlyn Albert doing  total right knee on Nov 23.  She has no history of DVT,  Has had no problems with transfusions in  the past   She has a recent episodes of respiratory failure 24 hours after receiving Diprovan sedation for a colonoscopy.  History of brain aneurysm coiled in 2005 by [facility] neuroradiologist  Francisco Capuchin, and repeat MRA done last month showed no recurrence. History of hypertension, now well controlled.    She no history of CAD , CKD. or Diabetes mellitus, and  no recent chest pain  Past Medical History  Diagnosis Date  . Degenerative disc disease     HERNITAED DISC BY MRI ON 07/07;S/P LAMINECTOMY,RID REPLACEMENT  . Brain aneurysm     S/P RESECTION  . Discoid lupus erythematosus eyelid     NEGATIVE SEROLOGIC MARKERS  . Hypercholesterolemia   . Chronic sinusitis   . Hypertension   . Complication of anesthesia 05/30/2010    from colonoscopy-had Propofol-had a  . Complication of anesthesia     reaction-went into asthma-pneumonia,-  . Complication of anesthesia     then CHF and to ICU  . Dysrhythmia     PVC's occ.  . Pneumonia 05/30/2010    after receiving Propofol  . Headache     migraines occ.  . CHF (congestive heart failure) 05/2010    after receiving Propofol  . Hypothyroidism     takes Synthroid  . Blood transfusion 2009    back surgery  . Sleep apnea     uses CPAP-8.4  . Asthma 06/2010    depending on what around   Current Outpatient Prescriptions on File Prior to Visit  Medication Sig Dispense Refill  . benzonatate (TESSALON) 100 MG capsule Take 100 mg by mouth 3 (three) times daily as needed. Congestion       . budesonide-formoterol (SYMBICORT) 80-4.5 MCG/ACT inhaler Inhale 2 puffs into the lungs 2 (two) times daily.        . calcium  citrate-vitamin D (CITRACAL+D) 315-200 MG-UNIT per tablet Take 2 tablets by mouth at bedtime.       Marland Kitchen desloratadine (CLARINEX) 5 MG tablet Take 5 mg by mouth every morning.        Marland Kitchen doxepin (SINEQUAN) 25 MG capsule Take 50 mg by mouth at bedtime.       Marland Kitchen escitalopram (LEXAPRO) 10 MG tablet Take 10 mg by mouth every morning.       . furosemide (LASIX) 20 MG tablet Take 20-40 mg by mouth daily. Fluid       . levothyroxine (SYNTHROID, LEVOTHROID) 137 MCG tablet Take 137 mcg by mouth every morning.       . lidocaine (LIDODERM) 5 % Place 1 patch onto the skin daily. Remove & Discard patch within 12 hours or as directed by MD       . Riesa Pope 1 G capsule TAKE 2 CAPSULES TWICE A DAY  360 capsule  2  . Lutein 6 MG CAPS Take 1 capsule by mouth daily.       . metoprolol (LOPRESSOR) 100 MG tablet Take 100 mg by mouth 2 (two) times daily.        Marland Kitchen oxyCODONE-acetaminophen (PERCOCET) 10-325 MG per tablet Take 1 tablet by mouth  every 6 (six) hours as needed.  120 tablet  none  . potassium chloride (KLOR-CON 10) 10 MEQ CR tablet Take 10-20 mEq by mouth. Pts take every morning but if she take lasix 40mg  then will take extra dose of potassium      . Prenatal Vit-Fe Fumarate-FA (PRENAPLUS) 27-1 MG TABS Take by mouth daily.        Marland Kitchen spironolactone (ALDACTONE) 50 MG tablet Take 50 mg by mouth every morning.       . triamcinolone (KENALOG) 0.025 % ointment Apply 1 application topically 2 (two) times daily. itch       . vitamin C (ASCORBIC ACID) 500 MG tablet Take 1,000 mg by mouth daily.       Marland Kitchen zolpidem (AMBIEN) 5 MG tablet Take 1 tablet (5 mg total) by mouth at bedtime as needed for sleep (BRAND NAME ONLY,  PATIENT HAD ALLERGIC REACTION TO GENERIC).  30 tablet  5   No current facility-administered medications on file prior to visit.    Review of Systems  Constitutional: Negative for fever, chills and unexpected weight change.  HENT: Negative for hearing loss, ear pain, nosebleeds, congestion, sore throat,  facial swelling, rhinorrhea, sneezing, mouth sores, trouble swallowing, neck pain, neck stiffness, voice change, postnasal drip, sinus pressure, tinnitus and ear discharge.   Eyes: Negative for pain, discharge, redness and visual disturbance.  Respiratory: Negative for cough, chest tightness, shortness of breath, wheezing and stridor.   Cardiovascular: Negative for chest pain, palpitations and leg swelling.  Musculoskeletal: Positive for back pain. Negative for myalgias and arthralgias.  Skin: Negative for color change and rash.  Neurological: Negative for dizziness, weakness, light-headedness and headaches.  Hematological: Negative for adenopathy.       Objective:   Physical Exam  Constitutional: She is oriented to person, place, and time. She appears well-developed and well-nourished.  HENT:  Mouth/Throat: Oropharynx is clear and moist.  Eyes: EOM are normal. Pupils are equal, round, and reactive to light. No scleral icterus.  Neck: Normal range of motion. Neck supple. No JVD present. No thyromegaly present.  Cardiovascular: Normal rate, regular rhythm, normal heart sounds and intact distal pulses.   Pulmonary/Chest: Effort normal and breath sounds normal.  Abdominal: Soft. Bowel sounds are normal. She exhibits no mass. There is no tenderness.  Musculoskeletal: Normal range of motion. She exhibits no edema.  Lymphadenopathy:    She has no cervical adenopathy.  Neurological: She is alert and oriented to person, place, and time.  Skin: Skin is warm and dry.  Psychiatric: She has a normal mood and affect.          Assessment & Plan:

## 2011-06-27 ENCOUNTER — Encounter (HOSPITAL_COMMUNITY): Payer: Self-pay

## 2011-06-27 ENCOUNTER — Encounter (HOSPITAL_COMMUNITY): Payer: Medicare Other

## 2011-06-27 LAB — URINALYSIS, ROUTINE W REFLEX MICROSCOPIC
Bilirubin Urine: NEGATIVE
Hgb urine dipstick: NEGATIVE
Ketones, ur: NEGATIVE mg/dL
Nitrite: NEGATIVE
Specific Gravity, Urine: 1.01 (ref 1.005–1.030)
Urobilinogen, UA: 0.2 mg/dL (ref 0.0–1.0)
pH: 6.5 (ref 5.0–8.0)

## 2011-06-27 LAB — COMPREHENSIVE METABOLIC PANEL
Albumin: 3.9 g/dL (ref 3.5–5.2)
Alkaline Phosphatase: 66 U/L (ref 39–117)
BUN: 44 mg/dL — ABNORMAL HIGH (ref 6–23)
CO2: 22 mEq/L (ref 19–32)
Chloride: 103 mEq/L (ref 96–112)
Creatinine, Ser: 1.34 mg/dL — ABNORMAL HIGH (ref 0.50–1.10)
GFR calc non Af Amer: 40 mL/min — ABNORMAL LOW (ref >90)
Glucose, Bld: 93 mg/dL (ref 70–99)
Potassium: 5 mEq/L (ref 3.5–5.1)
Total Bilirubin: 0.4 mg/dL (ref 0.3–1.2)

## 2011-06-27 LAB — CBC
HCT: 35.3 % — ABNORMAL LOW (ref 36.0–46.0)
Hemoglobin: 11.7 g/dL — ABNORMAL LOW (ref 12.0–15.0)
MCV: 97 fL (ref 78.0–100.0)
RBC: 3.64 MIL/uL — ABNORMAL LOW (ref 3.87–5.11)
WBC: 7.6 10*3/uL (ref 4.0–10.5)

## 2011-06-27 LAB — PROTIME-INR
INR: 0.97 (ref 0.00–1.49)
Prothrombin Time: 13.1 seconds (ref 11.6–15.2)

## 2011-06-27 LAB — APTT: aPTT: 37 seconds (ref 24–37)

## 2011-06-27 LAB — SURGICAL PCR SCREEN: Staphylococcus aureus: NEGATIVE

## 2011-06-27 MED ORDER — CHLORHEXIDINE GLUCONATE 4 % EX LIQD: 60.0000 mL | Freq: Once | CUTANEOUS | Status: DC

## 2011-06-27 NOTE — Pre-Procedure Instructions (Signed)
06/21/2011-chest PA and lateral on chart, 02/16/2011-EKG- on chart, 06/29/2010-Echo doppler on chart

## 2011-06-27 NOTE — Patient Instructions (Signed)
Repeat your chest x ray at [facility] .Marland Kitchen

## 2011-06-27 NOTE — Patient Instructions (Signed)
20 ABY GESSEL  06/27/2011   Your procedure is scheduled on:  07/10/2011  Report to [facility] at [phone] AM.  Call this number if you have problems the morning of surgery: [phone]   Remember:   Do not eat food:After Midnight.  Do not drink clear liquids: After Midnight.  Take these medicines the morning of surgery with A SIP OF WATER: Imdur,Synthroid,Lexapro,Norvasc,Clarinex,Lopressor,Apressoline   Do not wear jewelry, make-up or nail polish.  Do not wear lotions, powders, or perfumes. .  Do not shave 48 hours prior to surgery.  Do not bring valuables to the hospital.  Contacts, dentures or bridgework may not be worn into surgery.  Leave suitcase in the car. After surgery it may be brought to your room.  For patients admitted to the hospital, checkout time is 11:00 AM the day of discharge.   Patients discharged the day of surgery will not be allowed to drive home.  Name and phone number of your driver:   Special Instructions: CHG Shower Use Special Wash: 1/2 bottle night before surgery and 1/2 bottle morning of surgery.   Please read over the following fact sheets that you were given: MRSA Information

## 2011-06-27 NOTE — Assessment & Plan Note (Signed)
She has had no escalation in narcotics use in recent months.  Continue monthly refills.

## 2011-06-27 NOTE — Assessment & Plan Note (Signed)
She has no contrindicatiosn to surgery , has no history of CAD.  Her two main issues are hypertension which is finally under control since stopping clonidine, and respiratory arrest during Diprovan infusion several years aog,  EKG and CXR were normal.

## 2011-06-29 ENCOUNTER — Other Ambulatory Visit: Payer: Self-pay | Admitting: Internal Medicine

## 2011-07-02 ENCOUNTER — Other Ambulatory Visit: Payer: Self-pay | Admitting: Orthopedic Surgery

## 2011-07-02 NOTE — H&P (Signed)
[name]  DOB: 12-Mar-1941  Date of Admission: 07/10/2011  Chief Complaint:  Right Knee Pain  History of Present Illness: The patient is a 70 year old female who comes in today for a preoperative History and Physical. The patient is scheduled for a right total knee arthroplasty to be performed by Dr. Gus Rankin. Aluisio, MD on 07/10/2011.  Allergies: PENICILLINS Anaphylaxis  KENALOG Hives  SEPTRA (BACTRIM) Swelling, Rash   Medications: benzonatate (TESSALON) 100 MG capsule  budesonide-formoterol (SYMBICORT) 80-4.5 MCG/ACT inhaler  calcium citrate-vitamin D (CITRACAL+D) 315-200 MG-UNIT per tablet  desloratadine (CLARINEX) 5 MG tablet  diazepam (VALIUM) 5 MG tablet  doxepin (SINEQUAN) 25 MG capsule  escitalopram (LEXAPRO) 10 MG tablet  furosemide (LASIX) 20 MG tablet  hydrALAZINE (APRESOLINE) 50 MG tablet  isosorbide mononitrate (IMDUR) 60 MG 24 hr tablet  levothyroxine (SYNTHROID, LEVOTHROID) 137 MCG tablet  lidocaine (LIDODERM) 5 % losartan (COZAAR) 100 MG tablet  LOVAZA 1 G capsule  Lutein 6 MG CAPS  metoprolol (LOPRESSOR) 100 MG tablet  montelukast (SINGULAIR) 10 MG tablet  NORVASC 10 MG tablet  oxyCODONE-acetaminophen (PERCOCET) 10-325 MG per tablet  potassium chloride (KLOR-CON 10) 10 MEQ CR tablet  Prenatal Vit-Fe Fumarate-FA (VITAFOL-PN) TABS  rosuvastatin (CRESTOR) 20 MG tablet  sodium chloride (OCEAN) 0.65 % nasal spray  spironolactone (ALDACTONE) 50 MG tablet  SYNTHROID 150 MCG tablet  triamcinolone (KENALOG) 0.025 % ointment  vitamin C (ASCORBIC ACID) 500 MG tablet  zolpidem (AMBIEN) 5 MG tablet  Past Medical Migraine Headache Impaired Vision Asthma Pneumonia Hypertension Congestive Heart Failure Aneurysm. S/P coil Sleep Apnea. uses CPAP - will bring mask Phlebitis Urinary Tract Infection Discoid Lupus Menopause  Past Surgical History Lumbar Surgery x4 Sinus surgery x5 Aneurysm Repair -Coiled Hysterectomy Carpal Tunnel Surgery -  Both Vein Stripping - Left Leg Breast Mass Exc - right D&C x3 Morton's Neuroma Exc. - Left  Family History Mother. Cerebrovascular Accident.  Social History Tobacco use. Former smoker. Marital status. Married. Children. 2 Living situation. Lives with spouse. Current occupation. Nurse  Review of Systems General: No chills, fevers, night sweats, fatigue. Neuro:  No seizures, syncope, paralysis, visual problems. Occasional headaches. Respiratory:  No shortness of breath, productive cough, hemoptysis. Cardiovascular: No chest pain, angina, palpitations, orthopnea. GI: No nausea, vomiting, diarrhea, constipation. GU:  No dysuria, hematuria, discharge. Musculoskeletal:  Joint pain, back pain and stiffness.  Vitals: Weight: 206 lb Height: 62 in Weight was reported by patient. Height was reported by patient. Body Surface Area: 2.02 m Body Mass Index: 37.68 kg/m Pulse: 54 (Regular) Resp.: 14 (Unlabored) BP: 99/62 (Sitting, Right Arm, Standard)  GENERAL: Patient is a 70 y.o. female, well-nourished, well-developed, no acute distress. Alert, oriented, cooperative. Overweight. Excellent historian. Accompanied by husband. HENT:  Normocephalic, atraumatic. Pupils round and reactive. EOMs intact. NECK:  Supple, no bruits. CHEST:  Clear to anterior and posterior chest walls. No rhonchi, rales, wheezes. HEART:  Regular, rate and rhythm.  No murmurs.  S1 and S2 noted. ABDOMEN:  Soft, nontender, bowel sounds present. Reound. Slightly protuberant. RECTAL/BREAST/GENITALIA:  Not done, not pertinent to present illness. EXTREMITIES:  Right knee - positive effusion, slight varus, ROM 5-125, marked crepitus, no instability.   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  There are on  active contraindications to upcoming procedure such as ongoing infection or progressive neurological  disease.

## 2011-07-03 ENCOUNTER — Telehealth: Payer: Self-pay | Admitting: Internal Medicine

## 2011-07-04 MED ORDER — LOSARTAN POTASSIUM 50 MG PO TABS: 50.0000 mg | ORAL_TABLET | Freq: Two times a day (BID) | ORAL | Status: DC

## 2011-07-04 NOTE — Telephone Encounter (Signed)
Patient says that she has already been taking cozaar 50 mg. She wants to know what to do about the lopressor. She says that the pharmacy gave her 50 mg and she was taking 100 so she has been taking 2 times a day. She says that her blood pressure was 95/39 late yesterday afternoon so she is asking if she can just come off of the lopressor. Please advise.

## 2011-07-04 NOTE — Telephone Encounter (Signed)
My mistake.  Yes she can try coming off the lopressor and if her bp  Becomes elevated again we will write for Lopressor 50 mg daily

## 2011-07-04 NOTE — Telephone Encounter (Signed)
We will have to write the prescription for Cozaar 50 mg "Dispense as written"  :  2 tablets daily.  #60 with 6 refills.

## 2011-07-05 NOTE — Telephone Encounter (Signed)
Patient notified. She says that she will monitor her blood pressure and if it becomes elevated again she will call us back.

## 2011-07-09 MED ORDER — VANCOMYCIN HCL 1000 MG IV SOLR
1500.0000 mg | Freq: Once | INTRAVENOUS | Status: DC
  Filled 2011-07-09: qty 1500

## 2011-07-10 ENCOUNTER — Ambulatory Visit: Payer: Self-pay | Admitting: Internal Medicine

## 2011-07-10 ENCOUNTER — Encounter (HOSPITAL_COMMUNITY): Payer: Self-pay | Admitting: Orthopedic Surgery

## 2011-07-10 ENCOUNTER — Inpatient Hospital Stay (HOSPITAL_COMMUNITY)
Admission: AD | Admit: 2011-07-10 | Discharge: 2011-07-14 | DRG: 470 | Disposition: A | Discharge status: Home Health | Payer: Medicare Other | Source: Ambulatory Visit | Attending: Orthopedic Surgery | Admitting: Orthopedic Surgery

## 2011-07-10 ENCOUNTER — Ambulatory Visit (HOSPITAL_COMMUNITY): Payer: Medicare Other | Admitting: Registered Nurse

## 2011-07-10 ENCOUNTER — Encounter (HOSPITAL_COMMUNITY): Payer: Self-pay | Admitting: Registered Nurse

## 2011-07-10 ENCOUNTER — Encounter (HOSPITAL_COMMUNITY): Payer: Self-pay | Admitting: *Deleted

## 2011-07-10 ENCOUNTER — Encounter (HOSPITAL_COMMUNITY)
Admission: AD | Disposition: A | Discharge status: Home Health | Payer: Self-pay | Source: Ambulatory Visit | Attending: Orthopedic Surgery

## 2011-07-10 DIAGNOSIS — M1710 Unilateral primary osteoarthritis, unspecified knee: Principal | ICD-10-CM | POA: Diagnosis present

## 2011-07-10 DIAGNOSIS — Z01812 Encounter for preprocedural laboratory examination: Secondary | ICD-10-CM

## 2011-07-10 DIAGNOSIS — M179 Osteoarthritis of knee, unspecified: Secondary | ICD-10-CM

## 2011-07-10 DIAGNOSIS — E039 Hypothyroidism, unspecified: Secondary | ICD-10-CM | POA: Diagnosis present

## 2011-07-10 DIAGNOSIS — I10 Essential (primary) hypertension: Secondary | ICD-10-CM | POA: Diagnosis present

## 2011-07-10 DIAGNOSIS — G4730 Sleep apnea, unspecified: Secondary | ICD-10-CM | POA: Diagnosis present

## 2011-07-10 DIAGNOSIS — H9210 Otorrhea, unspecified ear: Secondary | ICD-10-CM | POA: Diagnosis not present

## 2011-07-10 DIAGNOSIS — D649 Anemia, unspecified: Secondary | ICD-10-CM | POA: Diagnosis not present

## 2011-07-10 HISTORY — PX: TOTAL KNEE ARTHROPLASTY: SHX125

## 2011-07-10 SURGERY — ARTHROPLASTY, KNEE, TOTAL
Anesthesia: General | Site: Knee | Laterality: Right | Wound class: Clean

## 2011-07-10 MED ORDER — ISOSORBIDE MONONITRATE ER 60 MG PO TB24
60.0000 mg | ORAL_TABLET | Freq: Every day | ORAL | Status: DC
  Administered 2011-07-10 – 2011-07-14 (×5): 60 mg via ORAL
  Filled 2011-07-10 (×2): qty 1

## 2011-07-10 MED ORDER — ROSUVASTATIN CALCIUM 20 MG PO TABS
20.0000 mg | ORAL_TABLET | Freq: Every day | ORAL | Status: DC
  Administered 2011-07-10 – 2011-07-13 (×4): 20 mg via ORAL
  Filled 2011-07-10 (×6): qty 1

## 2011-07-10 MED ORDER — LEVOTHYROXINE SODIUM 150 MCG PO TABS
150.0000 ug | ORAL_TABLET | Freq: Every day | ORAL | Status: DC
  Filled 2011-07-10: qty 1

## 2011-07-10 MED ORDER — LORATADINE 10 MG PO TABS
10.0000 mg | ORAL_TABLET | Freq: Every day | ORAL | Status: DC
  Administered 2011-07-10 – 2011-07-11 (×2): 10 mg via ORAL
  Filled 2011-07-10 (×2): qty 1

## 2011-07-10 MED ORDER — POTASSIUM CHLORIDE 10 MEQ PO TBCR
10.0000 meq | EXTENDED_RELEASE_TABLET | Freq: Every day | ORAL | Status: DC
  Administered 2011-07-10 – 2011-07-14 (×4): 10 meq via ORAL
  Filled 2011-07-10 (×5): qty 2

## 2011-07-10 MED ORDER — FLEET ENEMA 7-19 GM/118ML RE ENEM: 1.0000 | ENEMA | Freq: Every day | RECTAL | Status: DC | PRN

## 2011-07-10 MED ORDER — FENTANYL CITRATE 0.05 MG/ML IJ SOLN
INTRAMUSCULAR | Status: DC | PRN
  Administered 2011-07-10 (×2): 100 ug via INTRAVENOUS
  Administered 2011-07-10: 50 ug via INTRAVENOUS

## 2011-07-10 MED ORDER — LACTATED RINGERS IV SOLN
INTRAVENOUS | Status: DC | PRN
  Administered 2011-07-10 (×2): via INTRAVENOUS

## 2011-07-10 MED ORDER — ESCITALOPRAM OXALATE 10 MG PO TABS
10.0000 mg | ORAL_TABLET | ORAL | Status: DC
  Administered 2011-07-11: 10 mg via ORAL
  Filled 2011-07-10 (×2): qty 1

## 2011-07-10 MED ORDER — ALBUTEROL SULFATE (5 MG/ML) 0.5% IN NEBU
2.5000 mg | INHALATION_SOLUTION | Freq: Four times a day (QID) | RESPIRATORY_TRACT | Status: DC | PRN
  Filled 2011-07-10 (×2): qty 0.5

## 2011-07-10 MED ORDER — BISACODYL 10 MG RE SUPP: 10.0000 mg | Freq: Every day | RECTAL | Status: DC | PRN

## 2011-07-10 MED ORDER — DIPHENHYDRAMINE HCL 12.5 MG/5ML PO ELIX
12.5000 mg | ORAL_SOLUTION | ORAL | Status: DC | PRN
  Filled 2011-07-10: qty 5

## 2011-07-10 MED ORDER — VANCOMYCIN HCL IN DEXTROSE 1-5 GM/200ML-% IV SOLN
1000.0000 mg | Freq: Two times a day (BID) | INTRAVENOUS | Status: AC
  Administered 2011-07-10: 1000 mg via INTRAVENOUS
  Filled 2011-07-10: qty 200

## 2011-07-10 MED ORDER — TRIAMCINOLONE ACETONIDE 0.025 % EX CREA
1.0000 "application " | TOPICAL_CREAM | Freq: Two times a day (BID) | CUTANEOUS | Status: DC
  Administered 2011-07-13 – 2011-07-14 (×2): 1 via TOPICAL
  Filled 2011-07-10 (×2): qty 15

## 2011-07-10 MED ORDER — ACETAMINOPHEN 10 MG/ML IV SOLN
1000.0000 mg | Freq: Four times a day (QID) | INTRAVENOUS | Status: AC
  Administered 2011-07-11 (×3): 1000 mg via INTRAVENOUS
  Filled 2011-07-10 (×4): qty 100

## 2011-07-10 MED ORDER — DOXEPIN HCL 50 MG PO CAPS
50.0000 mg | ORAL_CAPSULE | Freq: Every day | ORAL | Status: DC
  Administered 2011-07-10 – 2011-07-13 (×4): 50 mg via ORAL
  Filled 2011-07-10 (×6): qty 1

## 2011-07-10 MED ORDER — DOCUSATE SODIUM 100 MG PO CAPS
100.0000 mg | ORAL_CAPSULE | Freq: Two times a day (BID) | ORAL | Status: DC
  Administered 2011-07-10 – 2011-07-14 (×6): 100 mg via ORAL
  Filled 2011-07-10 (×10): qty 1

## 2011-07-10 MED ORDER — SALINE SPRAY 0.65 % NA SOLN
2.0000 | NASAL | Status: DC | PRN
  Filled 2011-07-10: qty 44

## 2011-07-10 MED ORDER — BUPIVACAINE 0.25 % ON-Q PUMP SINGLE CATH 300ML
300.0000 mL | INJECTION | Status: DC
  Filled 2011-07-10: qty 300

## 2011-07-10 MED ORDER — ROCURONIUM BROMIDE 100 MG/10ML IV SOLN
INTRAVENOUS | Status: DC | PRN
  Administered 2011-07-10: 50 mg via INTRAVENOUS

## 2011-07-10 MED ORDER — MONTELUKAST SODIUM 10 MG PO TABS
10.0000 mg | ORAL_TABLET | Freq: Every day | ORAL | Status: DC
  Administered 2011-07-10 – 2011-07-13 (×4): 10 mg via ORAL
  Filled 2011-07-10 (×6): qty 1

## 2011-07-10 MED ORDER — BENZONATATE 100 MG PO CAPS
100.0000 mg | ORAL_CAPSULE | Freq: Three times a day (TID) | ORAL | Status: DC | PRN
  Filled 2011-07-10: qty 1

## 2011-07-10 MED ORDER — NEOSTIGMINE METHYLSULFATE 1 MG/ML IJ SOLN
INTRAMUSCULAR | Status: DC | PRN
  Administered 2011-07-10: 3 mg via INTRAVENOUS

## 2011-07-10 MED ORDER — MORPHINE SULFATE (PF) 1 MG/ML IV SOLN
INTRAVENOUS | Status: DC
  Administered 2011-07-10 (×2): via INTRAVENOUS
  Administered 2011-07-10: 13.2 mg via INTRAVENOUS
  Administered 2011-07-10: 23 mg via INTRAVENOUS
  Administered 2011-07-10: 13.2 mg via INTRAVENOUS
  Administered 2011-07-11: 7 mg via INTRAVENOUS
  Filled 2011-07-10 (×4): qty 25

## 2011-07-10 MED ORDER — ACETAMINOPHEN 10 MG/ML IV SOLN
INTRAVENOUS | Status: AC
  Filled 2011-07-10: qty 100

## 2011-07-10 MED ORDER — METHOCARBAMOL 500 MG PO TABS
500.0000 mg | ORAL_TABLET | Freq: Four times a day (QID) | ORAL | Status: DC | PRN
  Administered 2011-07-12 – 2011-07-14 (×3): 500 mg via ORAL
  Filled 2011-07-10 (×3): qty 1

## 2011-07-10 MED ORDER — METOCLOPRAMIDE HCL 10 MG PO TABS: 5.0000 mg | ORAL_TABLET | Freq: Three times a day (TID) | ORAL | Status: DC | PRN

## 2011-07-10 MED ORDER — SODIUM CHLORIDE 0.9 % IR SOLN
Status: DC | PRN
  Administered 2011-07-10: 1000 mL

## 2011-07-10 MED ORDER — OXYCODONE HCL 5 MG PO TABS
ORAL_TABLET | ORAL | Status: AC
  Administered 2011-07-10: 10 mg via ORAL
  Filled 2011-07-10: qty 2

## 2011-07-10 MED ORDER — GLYCOPYRROLATE 0.2 MG/ML IJ SOLN
INTRAMUSCULAR | Status: DC | PRN
  Administered 2011-07-10: .2 mg via INTRAVENOUS

## 2011-07-10 MED ORDER — PROPOFOL 10 MG/ML IV EMUL
INTRAVENOUS | Status: DC | PRN
  Administered 2011-07-10: 180 mg via INTRAVENOUS

## 2011-07-10 MED ORDER — SPIRONOLACTONE 50 MG PO TABS
50.0000 mg | ORAL_TABLET | Freq: Every day | ORAL | Status: DC
  Administered 2011-07-10: 50 mg via ORAL
  Filled 2011-07-10 (×2): qty 1

## 2011-07-10 MED ORDER — RIVAROXABAN 10 MG PO TABS
10.0000 mg | ORAL_TABLET | ORAL | Status: DC
  Administered 2011-07-11 – 2011-07-14 (×4): 10 mg via ORAL
  Filled 2011-07-10 (×4): qty 1

## 2011-07-10 MED ORDER — MIDAZOLAM HCL 5 MG/5ML IJ SOLN
INTRAMUSCULAR | Status: DC | PRN
  Administered 2011-07-10: 1 mg via INTRAVENOUS

## 2011-07-10 MED ORDER — BISACODYL 5 MG PO TBEC: 10.0000 mg | DELAYED_RELEASE_TABLET | Freq: Every day | ORAL | Status: DC | PRN

## 2011-07-10 MED ORDER — LIDOCAINE 5 % EX PTCH
1.0000 | MEDICATED_PATCH | CUTANEOUS | Status: DC
  Filled 2011-07-10 (×6): qty 1

## 2011-07-10 MED ORDER — HYDRALAZINE HCL 50 MG PO TABS
50.0000 mg | ORAL_TABLET | Freq: Two times a day (BID) | ORAL | Status: DC
  Administered 2011-07-10 – 2011-07-14 (×8): 50 mg via ORAL
  Filled 2011-07-10 (×10): qty 1

## 2011-07-10 MED ORDER — ONDANSETRON HCL 4 MG/2ML IJ SOLN
INTRAMUSCULAR | Status: DC | PRN
  Administered 2011-07-10: 4 mg via INTRAVENOUS

## 2011-07-10 MED ORDER — FUROSEMIDE 20 MG PO TABS
20.0000 mg | ORAL_TABLET | Freq: Every day | ORAL | Status: DC
  Administered 2011-07-10 – 2011-07-14 (×5): 20 mg via ORAL
  Filled 2011-07-10 (×3): qty 2

## 2011-07-10 MED ORDER — METOCLOPRAMIDE HCL 5 MG/ML IJ SOLN
5.0000 mg | Freq: Three times a day (TID) | INTRAMUSCULAR | Status: DC | PRN
  Administered 2011-07-10: 10 mg via INTRAVENOUS
  Filled 2011-07-10: qty 2

## 2011-07-10 MED ORDER — HETASTARCH-ELECTROLYTES 6 % IV SOLN
INTRAVENOUS | Status: DC | PRN
  Administered 2011-07-10: 09:00:00 via INTRAVENOUS

## 2011-07-10 MED ORDER — LOSARTAN POTASSIUM 50 MG PO TABS
50.0000 mg | ORAL_TABLET | Freq: Two times a day (BID) | ORAL | Status: DC
  Administered 2011-07-11: 50 mg via ORAL
  Filled 2011-07-10 (×5): qty 1

## 2011-07-10 MED ORDER — METOPROLOL TARTRATE 100 MG PO TABS
100.0000 mg | ORAL_TABLET | Freq: Two times a day (BID) | ORAL | Status: DC
  Administered 2011-07-10: 50 mg via ORAL
  Filled 2011-07-10 (×3): qty 1

## 2011-07-10 MED ORDER — MENTHOL 3 MG MT LOZG: 1.0000 | LOZENGE | OROMUCOSAL | Status: DC | PRN

## 2011-07-10 MED ORDER — BUPIVACAINE ON-Q PAIN PUMP (FOR ORDER SET NO CHG)
INJECTION | Status: DC
  Filled 2011-07-10: qty 1

## 2011-07-10 MED ORDER — ONDANSETRON HCL 4 MG/2ML IJ SOLN
4.0000 mg | Freq: Four times a day (QID) | INTRAMUSCULAR | Status: DC | PRN
  Administered 2011-07-11: 4 mg via INTRAVENOUS
  Filled 2011-07-10: qty 2

## 2011-07-10 MED ORDER — OXYCODONE HCL 5 MG PO TABS
5.0000 mg | ORAL_TABLET | ORAL | Status: DC | PRN
  Administered 2011-07-10 – 2011-07-11 (×4): 10 mg via ORAL
  Filled 2011-07-10 (×3): qty 2

## 2011-07-10 MED ORDER — ZOLPIDEM TARTRATE 5 MG PO TABS: 5.0000 mg | ORAL_TABLET | Freq: Every evening | ORAL | Status: DC | PRN

## 2011-07-10 MED ORDER — VANCOMYCIN HCL IN DEXTROSE 1-5 GM/200ML-% IV SOLN
INTRAVENOUS | Status: AC
  Filled 2011-07-10: qty 200

## 2011-07-10 MED ORDER — LEVOTHYROXINE SODIUM 137 MCG PO TABS
137.0000 ug | ORAL_TABLET | ORAL | Status: DC
  Administered 2011-07-11: 137 ug via ORAL
  Filled 2011-07-10 (×2): qty 1

## 2011-07-10 MED ORDER — PHENYLEPHRINE HCL 10 MG/ML IJ SOLN
INTRAMUSCULAR | Status: DC | PRN
  Administered 2011-07-10: 80 ug via INTRAVENOUS
  Administered 2011-07-10 (×2): 160 ug via INTRAVENOUS
  Administered 2011-07-10: 80 ug via INTRAVENOUS

## 2011-07-10 MED ORDER — BUDESONIDE-FORMOTEROL FUMARATE 80-4.5 MCG/ACT IN AERO
2.0000 | INHALATION_SPRAY | Freq: Two times a day (BID) | RESPIRATORY_TRACT | Status: DC
  Administered 2011-07-10 – 2011-07-14 (×8): 2 via RESPIRATORY_TRACT
  Filled 2011-07-10 (×2): qty 6.9

## 2011-07-10 MED ORDER — LOSARTAN POTASSIUM 50 MG PO TABS
100.0000 mg | ORAL_TABLET | ORAL | Status: DC
  Administered 2011-07-11 – 2011-07-13 (×3): 100 mg via ORAL
  Filled 2011-07-10 (×2): qty 2

## 2011-07-10 MED ORDER — METHOCARBAMOL 100 MG/ML IJ SOLN
500.0000 mg | Freq: Four times a day (QID) | INTRAVENOUS | Status: DC | PRN
  Administered 2011-07-10 – 2011-07-11 (×2): 500 mg via INTRAVENOUS
  Filled 2011-07-10 (×3): qty 5

## 2011-07-10 MED ORDER — DEXTROSE-NACL 5-0.9 % IV SOLN
INTRAVENOUS | Status: DC
  Administered 2011-07-10: 75 mL/h via INTRAVENOUS
  Administered 2011-07-11: 14:00:00 via INTRAVENOUS

## 2011-07-10 MED ORDER — ACETAMINOPHEN 10 MG/ML IV SOLN
INTRAVENOUS | Status: DC | PRN
  Administered 2011-07-10: 1000 mg via INTRAVENOUS

## 2011-07-10 MED ORDER — AMLODIPINE BESYLATE 10 MG PO TABS
10.0000 mg | ORAL_TABLET | Freq: Every day | ORAL | Status: DC
  Administered 2011-07-10 – 2011-07-14 (×5): 10 mg via ORAL
  Filled 2011-07-10 (×3): qty 1

## 2011-07-10 MED ORDER — EPHEDRINE SULFATE 50 MG/ML IJ SOLN
INTRAMUSCULAR | Status: DC | PRN
  Administered 2011-07-10: 10 mg via INTRAVENOUS

## 2011-07-10 MED ORDER — HYDROMORPHONE HCL PF 1 MG/ML IJ SOLN: 0.2500 mg | INTRAMUSCULAR | Status: DC | PRN

## 2011-07-10 MED ORDER — MAGNESIUM HYDROXIDE 400 MG/5ML PO SUSP: 30.0000 mL | Freq: Two times a day (BID) | ORAL | Status: DC | PRN

## 2011-07-10 MED ORDER — PHENOL 1.4 % MT LIQD: 1.0000 | OROMUCOSAL | Status: DC | PRN

## 2011-07-10 MED ORDER — POLYETHYLENE GLYCOL 3350 17 G PO PACK
17.0000 g | PACK | Freq: Every day | ORAL | Status: DC | PRN
  Filled 2011-07-10: qty 1

## 2011-07-10 MED ORDER — VANCOMYCIN HCL IN DEXTROSE 1-5 GM/200ML-% IV SOLN
1000.0000 mg | Freq: Once | INTRAVENOUS | Status: AC
  Administered 2011-07-10: 1000 mg via INTRAVENOUS

## 2011-07-10 MED ORDER — ONDANSETRON HCL 4 MG PO TABS: 4.0000 mg | ORAL_TABLET | Freq: Four times a day (QID) | ORAL | Status: DC | PRN

## 2011-07-10 MED ORDER — BUPIVACAINE HCL (PF) 0.25 % IJ SOLN
INTRAMUSCULAR | Status: DC | PRN
  Administered 2011-07-10: 300 mL

## 2011-07-10 MED ORDER — HYDROMORPHONE HCL PF 1 MG/ML IJ SOLN
INTRAMUSCULAR | Status: DC | PRN
  Administered 2011-07-10 (×2): 1 mg via INTRAVENOUS

## 2011-07-10 MED ORDER — SUFENTANIL CITRATE 50 MCG/ML IV SOLN
INTRAVENOUS | Status: DC | PRN
  Administered 2011-07-10 (×5): 10 ug via INTRAVENOUS

## 2011-07-10 MED ORDER — ACETAMINOPHEN 325 MG PO TABS
650.0000 mg | ORAL_TABLET | Freq: Four times a day (QID) | ORAL | Status: DC | PRN
  Administered 2011-07-11: 650 mg via ORAL
  Filled 2011-07-10 (×2): qty 2

## 2011-07-10 MED ORDER — LACTATED RINGERS IV SOLN
INTRAVENOUS | Status: DC
  Administered 2011-07-10: 1000 mL via INTRAVENOUS

## 2011-07-10 MED ORDER — DIAZEPAM 5 MG PO TABS
5.0000 mg | ORAL_TABLET | Freq: Two times a day (BID) | ORAL | Status: DC | PRN
  Administered 2011-07-11 – 2011-07-13 (×6): 5 mg via ORAL
  Filled 2011-07-10 (×3): qty 1

## 2011-07-10 MED ORDER — ACETAMINOPHEN 650 MG RE SUPP: 650.0000 mg | Freq: Four times a day (QID) | RECTAL | Status: DC | PRN

## 2011-07-10 SURGICAL SUPPLY — 52 items
BAG ZIPLOCK 12X15 (MISCELLANEOUS) ×2 IMPLANT
BANDAGE ELASTIC 6 VELCRO ST LF (GAUZE/BANDAGES/DRESSINGS) ×2 IMPLANT
BANDAGE ESMARK 6X9 LF (GAUZE/BANDAGES/DRESSINGS) ×1 IMPLANT
BLADE SAG 18X100X1.27 (BLADE) ×2 IMPLANT
BLADE SAW SGTL 11.0X1.19X90.0M (BLADE) ×2 IMPLANT
BNDG ESMARK 6X9 LF (GAUZE/BANDAGES/DRESSINGS) ×2
BOWL SMART MIX CTS (DISPOSABLE) ×2 IMPLANT
CATH KIT ON-Q SILVERSOAK 5IN (CATHETERS) ×2 IMPLANT
CEMENT HV SMART SET (Cement) ×4 IMPLANT
CLOTH BEACON ORANGE TIMEOUT ST (SAFETY) ×2 IMPLANT
CUFF TOURN SGL QUICK 34 (TOURNIQUET CUFF) ×1
CUFF TRNQT CYL 34X4X40X1 (TOURNIQUET CUFF) ×1 IMPLANT
DRAPE EXTREMITY T 121X128X90 (DRAPE) ×2 IMPLANT
DRAPE POUCH INSTRU U-SHP 10X18 (DRAPES) ×2 IMPLANT
DRAPE U-SHAPE 47X51 STRL (DRAPES) ×2 IMPLANT
DRSG ADAPTIC 3X8 NADH LF (GAUZE/BANDAGES/DRESSINGS) ×2 IMPLANT
DRSG PAD ABDOMINAL 8X10 ST (GAUZE/BANDAGES/DRESSINGS) ×2 IMPLANT
DURAPREP 26ML APPLICATOR (WOUND CARE) ×2 IMPLANT
ELECT REM PT RETURN 9FT ADLT (ELECTROSURGICAL) ×2
ELECTRODE REM PT RTRN 9FT ADLT (ELECTROSURGICAL) ×1 IMPLANT
EVACUATOR 1/8 PVC DRAIN (DRAIN) ×2 IMPLANT
FACESHIELD LNG OPTICON STERILE (SAFETY) ×10 IMPLANT
GAUZE SPONGE 4X4 12PLY STRL LF (GAUZE/BANDAGES/DRESSINGS) ×2 IMPLANT
GLOVE BIO SURGEON STRL SZ7.5 (GLOVE) ×2 IMPLANT
GLOVE BIO SURGEON STRL SZ8 (GLOVE) ×2 IMPLANT
GLOVE BIOGEL PI IND STRL 8 (GLOVE) ×2 IMPLANT
GLOVE BIOGEL PI INDICATOR 8 (GLOVE) ×2
GOWN PREVENTION PLUS XLARGE (GOWN DISPOSABLE) ×2 IMPLANT
GOWN STRL REIN XL XLG (GOWN DISPOSABLE) ×2 IMPLANT
HANDPIECE INTERPULSE COAX TIP (DISPOSABLE) ×1
IMMOBILIZER KNEE 20 (SOFTGOODS) ×2
IMMOBILIZER KNEE 20 THIGH 36 (SOFTGOODS) ×1 IMPLANT
KIT BASIN OR (CUSTOM PROCEDURE TRAY) ×2 IMPLANT
MANIFOLD NEPTUNE II (INSTRUMENTS) ×2 IMPLANT
NS IRRIG 1000ML POUR BTL (IV SOLUTION) ×2 IMPLANT
PACK TOTAL JOINT (CUSTOM PROCEDURE TRAY) ×2 IMPLANT
PAD ABD 7.5X8 STRL (GAUZE/BANDAGES/DRESSINGS) ×2 IMPLANT
PADDING CAST COTTON 6X4 STRL (CAST SUPPLIES) ×6 IMPLANT
PADDING WEBRIL 6 STERILE (GAUZE/BANDAGES/DRESSINGS) ×2 IMPLANT
POSITIONER SURGICAL ARM (MISCELLANEOUS) ×2 IMPLANT
SET HNDPC FAN SPRY TIP SCT (DISPOSABLE) ×1 IMPLANT
SPONGE GAUZE 4X4 12PLY (GAUZE/BANDAGES/DRESSINGS) ×2 IMPLANT
STRIP CLOSURE SKIN 1/2X4 (GAUZE/BANDAGES/DRESSINGS) ×4 IMPLANT
SUCTION FRAZIER 12FR DISP (SUCTIONS) ×2 IMPLANT
SUT MNCRL AB 4-0 PS2 18 (SUTURE) ×2 IMPLANT
SUT PDS AB 1 CT1 27 (SUTURE) ×6 IMPLANT
SUT VIC AB 2-0 CT1 27 (SUTURE) ×3
SUT VIC AB 2-0 CT1 TAPERPNT 27 (SUTURE) ×3 IMPLANT
TOWEL OR 17X26 10 PK STRL BLUE (TOWEL DISPOSABLE) ×4 IMPLANT
TRAY FOLEY CATH 14FRSI W/METER (CATHETERS) ×2 IMPLANT
WATER STERILE IRR 1500ML POUR (IV SOLUTION) ×2 IMPLANT
WRAP KNEE MAXI GEL POST OP (GAUZE/BANDAGES/DRESSINGS) ×2 IMPLANT

## 2011-07-10 NOTE — Progress Notes (Signed)
Patient instructed on purpose and use of incentive spirometer. Patient verbalized understanding and return demonstrated use. CLum Keas

## 2011-07-10 NOTE — Anesthesia Postprocedure Evaluation (Signed)
  Anesthesia Post-op Note  Patient: [name]  Procedure(s) Performed:  TOTAL KNEE ARTHROPLASTY  Patient Location: PACU  Anesthesia Type: General  Level of Consciousness: awake and alert   Airway and Oxygen Therapy: Patient Spontanous Breathing  Post-op Pain: mild  Post-op Assessment: Post-op Vital signs reviewed, Patient's Cardiovascular Status Stable, Respiratory Function Stable, Patent Airway and No signs of Nausea or vomiting  Post-op Vital Signs: stable  Complications: No apparent anesthesia complications

## 2011-07-10 NOTE — Progress Notes (Signed)
Orthopedic Tech Progress Note Patient Details:  [name] 03/06/1941 [id number] Pt don't have over head trapeze bar and I have ask the RN to see if they can change the bed in the AM. CPM Right Knee CPM Right Knee: On Right Knee Flexion (Degrees): 40  Right Knee Extension (Degrees): 10    [name] [facility] 07/10/2011, 5:13 PM

## 2011-07-10 NOTE — Anesthesia Preprocedure Evaluation (Addendum)
Anesthesia Evaluation  Patient identified by MRN, date of birth, ID band Patient awake    Reviewed: Allergy & Precautions, H&P , NPO status , Patient's Chart, lab work & pertinent test results  Airway Mallampati: II TM Distance: <3 FB Neck ROM: Full    Dental  (+) Missing   Pulmonary neg pulmonary ROS, asthma , sleep apnea ,    + decreased breath sounds      Cardiovascular hypertension, Pt. on medications +CHF and neg cardio ROS Regular Normal    Neuro/Psych Negative Neurological ROS  Negative Psych ROS   GI/Hepatic negative GI ROS, Neg liver ROS,   Endo/Other  Negative Endocrine ROSHypothyroidism Morbid obesity  Renal/GU negative Renal ROS  Genitourinary negative   Musculoskeletal negative musculoskeletal ROS (+)   Abdominal (+) obese,   Peds negative pediatric ROS (+)  Hematology negative hematology ROS (+)   Anesthesia Other Findings   Reproductive/Obstetrics negative OB ROS                          Anesthesia Physical Anesthesia Plan  ASA: III  Anesthesia Plan: General   Post-op Pain Management:    Induction: Intravenous  Airway Management Planned: Oral ETT  Additional Equipment:   Intra-op Plan:   Post-operative Plan: Extubation in OR  Informed Consent: I have reviewed the patients History and Physical, chart, labs and discussed the procedure including the risks, benefits and alternatives for the proposed anesthesia with the patient or authorized representative who has indicated his/her understanding and acceptance.   Dental advisory given  Plan Discussed with: CRNA  Anesthesia Plan Comments:        Anesthesia Quick Evaluation

## 2011-07-10 NOTE — H&P (View-Only) (Signed)
[name]  DOB: 05/02/1941  Date of Admission: 07/10/2011  Chief Complaint:  Right Knee Pain  History of Present Illness: The patient is a 70 year old female who comes in today for a preoperative History and Physical. The patient is scheduled for a right total knee arthroplasty to be performed by Dr. Frank V. Aluisio, MD on 07/10/2011.  Allergies: PENICILLINS Anaphylaxis  KENALOG Hives  SEPTRA (BACTRIM) Swelling, Rash   Medications: benzonatate (TESSALON) 100 MG capsule  budesonide-formoterol (SYMBICORT) 80-4.5 MCG/ACT inhaler  calcium citrate-vitamin D (CITRACAL+D) 315-200 MG-UNIT per tablet  desloratadine (CLARINEX) 5 MG tablet  diazepam (VALIUM) 5 MG tablet  doxepin (SINEQUAN) 25 MG capsule  escitalopram (LEXAPRO) 10 MG tablet  furosemide (LASIX) 20 MG tablet  hydrALAZINE (APRESOLINE) 50 MG tablet  isosorbide mononitrate (IMDUR) 60 MG 24 hr tablet  levothyroxine (SYNTHROID, LEVOTHROID) 137 MCG tablet  lidocaine (LIDODERM) 5 % losartan (COZAAR) 100 MG tablet  LOVAZA 1 G capsule  Lutein 6 MG CAPS  metoprolol (LOPRESSOR) 100 MG tablet  montelukast (SINGULAIR) 10 MG tablet  NORVASC 10 MG tablet  oxyCODONE-acetaminophen (PERCOCET) 10-325 MG per tablet  potassium chloride (KLOR-CON 10) 10 MEQ CR tablet  Prenatal Vit-Fe Fumarate-FA (VITAFOL-PN) TABS  rosuvastatin (CRESTOR) 20 MG tablet  sodium chloride (OCEAN) 0.65 % nasal spray  spironolactone (ALDACTONE) 50 MG tablet  SYNTHROID 150 MCG tablet  triamcinolone (KENALOG) 0.025 % ointment  vitamin C (ASCORBIC ACID) 500 MG tablet  zolpidem (AMBIEN) 5 MG tablet  Past Medical Migraine Headache Impaired Vision Asthma Pneumonia Hypertension Congestive Heart Failure Aneurysm. S/P coil Sleep Apnea. uses CPAP - will bring mask Phlebitis Urinary Tract Infection Discoid Lupus Menopause  Past Surgical History Lumbar Surgery x4 Sinus surgery x5 Aneurysm Repair -Coiled Hysterectomy Carpal Tunnel Surgery -  Both Vein Stripping - Left Leg Breast Mass Exc - right D&C x3 Morton's Neuroma Exc. - Left  Family History Mother. Cerebrovascular Accident.  Social History Tobacco use. Former smoker. Marital status. Married. Children. 2 Living situation. Lives with spouse. Current occupation. Nurse  Review of Systems General: No chills, fevers, night sweats, fatigue. Neuro:  No seizures, syncope, paralysis, visual problems. Occasional headaches. Respiratory:  No shortness of breath, productive cough, hemoptysis. Cardiovascular: No chest pain, angina, palpitations, orthopnea. GI: No nausea, vomiting, diarrhea, constipation. GU:  No dysuria, hematuria, discharge. Musculoskeletal:  Joint pain, back pain and stiffness.  Vitals: Weight: 206 lb Height: 62 in Weight was reported by patient. Height was reported by patient. Body Surface Area: 2.02 m Body Mass Index: 37.68 kg/m Pulse: 54 (Regular) Resp.: 14 (Unlabored) BP: 99/62 (Sitting, Right Arm, Standard)  GENERAL: Patient is a 70 y.o. female, well-nourished, well-developed, no acute distress. Alert, oriented, cooperative. Overweight. Excellent historian. Accompanied by husband. HENT:  Normocephalic, atraumatic. Pupils round and reactive. EOMs intact. NECK:  Supple, no bruits. CHEST:  Clear to anterior and posterior chest walls. No rhonchi, rales, wheezes. HEART:  Regular, rate and rhythm.  No murmurs.  S1 and S2 noted. ABDOMEN:  Soft, nontender, bowel sounds present. Reound. Slightly protuberant. RECTAL/BREAST/GENITALIA:  Not done, not pertinent to present illness. EXTREMITIES:  Right knee - positive effusion, slight varus, ROM 5-125, marked crepitus, no instability.   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  There are on  active contraindications to upcoming procedure such as ongoing infection or progressive neurological   disease.   

## 2011-07-10 NOTE — Op Note (Signed)
Pre-operative diagnosis- Osteoarthritis  Right knee(s)  Post-operative diagnosis- Osteoarthritis Right knee(s)  Surgeon- Gus Rankin. Mykale Gandolfo, MD  Assistant- Avel Peace, PA-C   Anesthesia-  General EBL-* No blood loss amount entered *  Drains Hemovac  Tourniquet time-  Total Tourniquet Time Documented: Thigh (Right) - 35 minutes   Complications- None  Condition-PACU - hemodynamically stable.   Brief Clinical Note   [name] is a 70 y.o. year old female with end stage OA of her right knee with progressively worsening pain and dysfunction. She has constant pain, with activity and at rest and significant functional deficits with difficulties even with ADLs. She has had extensive non-op management including analgesics, injections of cortisone and viscosupplements, and home exercise program, but remains in significant pain with significant dysfunction. Her x-rays show bone on bone arthritis of medial and patellofemoral compartments with lateral narrowing and subchondral sclerosis. She presents now for left Total Knee Arthroplasty.     Procedure in detail---   The patient is brought into the operating room and positioned supine on the operating table. After successful administration of  General,   a tourniquet is placed high on the  Right thigh(s) and the lower extremity is prepped and draped in the usual sterile fashion. Time out is performed by the operating team and then the  Right lower extremity is wrapped in Esmarch, knee flexed and the tourniquet inflated to 300 mmHg.       A midline incision is made with a ten blade through the subcutaneous tissue to the level of the extensor mechanism. A fresh blade is used to make a medial parapatellar arthrotomy. Soft tissue over the proximal medial tibia is subperiosteally elevated to the joint line with a knife and into the semimembranosus bursa with a Cobb elevator. Soft tissue over the proximal lateral tibia is elevated with attention being paid to  avoiding the patellar tendon on the tibial tubercle. The patella is everted, knee flexed 90 degrees and the ACL and PCL are removed. Findings are bone on bone arthritis of all 3 compartments.        The drill is used to create a starting hole in the distal femur and the canal is thoroughly irrigated with sterile saline to remove the fatty contents. The 5 degree Right  valgus alignment guide is placed into the femoral canal and the distal femoral cutting block is pinned to remove 11 mm off the distal femur. Resection is made with an oscillating saw.      The tibia is subluxed forward and the menisci are removed. The extramedullary alignment guide is placed referencing proximally at the medial aspect of the tibial tubercle and distally along the second metatarsal axis and tibial crest. The block is pinned to remove 2mm off the more deficient medial  side. Resection is made with an oscillating saw. Size 3is the most appropriate size for the tibia and the proximal tibia is prepared with the modular drill and keel punch for that size.      The femoral sizing guide is placed and size 3 is most appropriate. Rotation is marked off the epicondylar axis and confirmed by creating a rectangular flexion gap at 90 degrees. The size 3 cutting block is pinned in this rotation and the anterior, posterior and chamfer cuts are made with the oscillating saw. The intercondylar block is then placed and that cut is made.      Trial size 3 tibial component, trial size 3 posterior stabilized femur and a 10  mm posterior stabilized rotating platform insert trial is placed. Full extension is achieved with excellent varus/valgus and anterior/posterior balance throughout full range of motion. The patella is everted and thickness measured to be 22  mm. Free hand resection is taken to 12 mm, a 35 template is placed, lug holes are drilled, trial patella is placed, and it tracks normally. Osteophytes are removed off the posterior femur with the  trial in place. All trials are removed and the cut bone surfaces prepared with pulsatile lavage. Cement is mixed and once ready for implantation, the size 3 tibial implant, size   3posterior stabilized femoral component, and the size 35 patella are cemented in place and the patella is held with the clamp. The trial insert is placed and the knee held in full extension. All extruded cement is removed and once the cement is hard the permanent 10 mm posterior stabilized rotating platform insert is placed into the tibial tray.      The wound is copiously irrigated with saline solution and the extensor mechanism closed over a hemovac drain with #1 PDS suture. The tourniquet is released for a total tourniquet time of 35  minutes. Flexion against gravity is 135 degrees and the patella tracks normally. Subcutaneous tissue is closed with 2.0 vicryl and subcuticular with running 4.0 Monocryl. The catheter for the Marcaine pain pump is placed and the pump is initiated. The incision is cleaned and dried and steri-strips and a bulky sterile dressing are applied. The limb is placed into a knee immobilizer and the patient is awakened and transported to recovery in stable condition.      Please note that a surgical assistant was a medical necessity for this procedure in order to perform it in a safe and expeditious manner. Surgical assistant was necessary to retract the ligaments and vital neurovascular structures to prevent injury to them and also necessary for proper positioning of the limb to allow for anatomic placement of the prosthesis.   Gus Rankin Solomiya Pascale, MD    07/10/2011, 9:57 AM

## 2011-07-10 NOTE — Anesthesia Postprocedure Evaluation (Deleted)
  Anesthesia Post-op Note  Patient: [name]  Procedure(s) Performed:  TOTAL KNEE ARTHROPLASTY  Patient Location: PACU  Anesthesia Type: General  Level of Consciousness: awake and alert   Airway and Oxygen Therapy: Patient Spontanous Breathing  Post-op Pain: mild  Post-op Assessment: Post-op Vital signs reviewed, Patient's Cardiovascular Status Stable, Respiratory Function Stable, Patent Airway and No signs of Nausea or vomiting  Post-op Vital Signs: stable  Complications: No apparent anesthesia complications 

## 2011-07-10 NOTE — Interval H&P Note (Signed)
History and Physical Interval Note:   07/10/2011   8:31 AM   [name]  has presented today for surgery, with the diagnosis of Osteoarthritis Right Knee  The various methods of treatment have been discussed with the patient and family. After consideration of risks, benefits and other options for treatment, the patient has consented to  Procedure(s): TOTAL KNEE ARTHROPLASTY as a surgical intervention .  The patients' history has been reviewed, patient examined, no change in status, stable for surgery.  I have reviewed the patients' chart and labs.  Questions were answered to the patient's satisfaction.     Loanne Drilling  MD  Pt examined. History and physical unchanged  Gus Rankin. Elton Catalano, MD    07/10/2011, 8:31 AM

## 2011-07-10 NOTE — Transfer of Care (Signed)
Immediate Anesthesia Transfer of Care Note  Patient: [name]  Procedure(s) Performed:  TOTAL KNEE ARTHROPLASTY  Patient Location: PACU  Anesthesia Type: General  Level of Consciousness: awake, alert , oriented and responds to stimulation  Airway & Oxygen Therapy: Patient Spontanous Breathing and Patient connected to face mask oxygen  Post-op Assessment: Report given to PACU RN and Post -op Vital signs reviewed and stable  Post vital signs: stable  Complications: No apparent anesthesia complications

## 2011-07-11 ENCOUNTER — Telehealth: Payer: Self-pay | Admitting: Internal Medicine

## 2011-07-11 ENCOUNTER — Encounter: Payer: Self-pay | Admitting: Internal Medicine

## 2011-07-11 LAB — BASIC METABOLIC PANEL
BUN: 14 mg/dL (ref 6–23)
CO2: 25 mEq/L (ref 19–32)
Calcium: 9.4 mg/dL (ref 8.4–10.5)
Creatinine, Ser: 0.84 mg/dL (ref 0.50–1.10)

## 2011-07-11 LAB — CBC
MCH: 33 pg (ref 26.0–34.0)
MCV: 98.6 fL (ref 78.0–100.0)
Platelets: 132 10*3/uL — ABNORMAL LOW (ref 150–400)
RBC: 2.88 MIL/uL — ABNORMAL LOW (ref 3.87–5.11)

## 2011-07-11 MED ORDER — MORPHINE SULFATE 2 MG/ML IJ SOLN
1.0000 mg | INTRAMUSCULAR | Status: DC | PRN
  Administered 2011-07-11 – 2011-07-13 (×4): 2 mg via INTRAVENOUS
  Filled 2011-07-11 (×4): qty 1

## 2011-07-11 MED ORDER — NALOXONE HCL 0.4 MG/ML IJ SOLN: 0.4000 mg | INTRAMUSCULAR | Status: DC | PRN

## 2011-07-11 MED ORDER — SODIUM CHLORIDE 0.9 % IJ SOLN: 9.0000 mL | INTRAMUSCULAR | Status: DC | PRN

## 2011-07-11 MED ORDER — BENZONATATE 100 MG PO CAPS
100.0000 mg | ORAL_CAPSULE | Freq: Three times a day (TID) | ORAL | Status: DC | PRN
  Administered 2011-07-13: 200 mg via ORAL
  Filled 2011-07-11 (×3): qty 2

## 2011-07-11 MED ORDER — SPIRONOLACTONE 50 MG PO TABS
50.0000 mg | ORAL_TABLET | Freq: Every day | ORAL | Status: DC
  Administered 2011-07-11 – 2011-07-13 (×3): 50 mg via ORAL
  Filled 2011-07-11 (×5): qty 1

## 2011-07-11 MED ORDER — LEVOTHYROXINE SODIUM 150 MCG PO TABS
150.0000 ug | ORAL_TABLET | Freq: Every day | ORAL | Status: DC
  Administered 2011-07-12 – 2011-07-14 (×3): 150 ug via ORAL
  Filled 2011-07-11 (×3): qty 1

## 2011-07-11 MED ORDER — DESLORATADINE 5 MG PO TABS
5.0000 mg | ORAL_TABLET | Freq: Every day | ORAL | Status: DC
  Administered 2011-07-12 – 2011-07-14 (×3): 5 mg via ORAL
  Filled 2011-07-11: qty 1

## 2011-07-11 MED ORDER — DIPHENHYDRAMINE HCL 12.5 MG/5ML PO ELIX
12.5000 mg | ORAL_SOLUTION | Freq: Four times a day (QID) | ORAL | Status: DC | PRN
  Administered 2011-07-12: 12.5 mg via ORAL

## 2011-07-11 MED ORDER — POLYSACCHARIDE IRON 150 MG PO CAPS
150.0000 mg | ORAL_CAPSULE | Freq: Every day | ORAL | Status: DC
  Administered 2011-07-11 – 2011-07-14 (×3): 150 mg via ORAL
  Filled 2011-07-11 (×4): qty 1

## 2011-07-11 MED ORDER — METOPROLOL TARTRATE 50 MG PO TABS
100.0000 mg | ORAL_TABLET | Freq: Two times a day (BID) | ORAL | Status: DC
  Administered 2011-07-11 – 2011-07-13 (×5): 100 mg via ORAL
  Administered 2011-07-13: 50 mg via ORAL
  Administered 2011-07-14: 100 mg via ORAL
  Filled 2011-07-11 (×2): qty 2

## 2011-07-11 MED ORDER — ESCITALOPRAM OXALATE 10 MG PO TABS
10.0000 mg | ORAL_TABLET | Freq: Every day | ORAL | Status: DC
  Administered 2011-07-12 – 2011-07-14 (×3): 10 mg via ORAL

## 2011-07-11 MED ORDER — METOPROLOL TARTRATE 100 MG PO TABS: 100.0000 mg | ORAL_TABLET | Freq: Two times a day (BID) | ORAL | Status: DC

## 2011-07-11 MED ORDER — ONDANSETRON HCL 4 MG/2ML IJ SOLN: 4.0000 mg | Freq: Four times a day (QID) | INTRAMUSCULAR | Status: DC | PRN

## 2011-07-11 MED ORDER — DIPHENHYDRAMINE HCL 50 MG/ML IJ SOLN: 12.5000 mg | Freq: Four times a day (QID) | INTRAMUSCULAR | Status: DC | PRN

## 2011-07-11 NOTE — Telephone Encounter (Signed)
FYI Ms Cales husband came in and wanted to let you know that Dr Wandra Mannan said ms Hubbs's knee surgery went well

## 2011-07-11 NOTE — Progress Notes (Signed)
Subjective: 1 Day Post-Op Procedure(s) (LRB): TOTAL KNEE ARTHROPLASTY (Right) Patient reports pain as mild.   Patient seen in rounds with Dr. Lequita Halt. Patient has complaints of some pain but most of her complaints are about her medications. She states that she is unable to take any generic meds and her medications were ordered that way postop as per Dr. Lequita Halt.  Will recheck the meds.  She also states that she brought her on meds in and gave them to the RN last evening.  She does not believe that they are giving them to her and she has not gotten them back.  Will make sure that she gets them back before leaving the hospital.  We will start therapy today. Plan is to go home after hospital stay.  Objective: Vital signs in last 24 hours: Temp:  [97.8 F (36.6 C)-100.3 F (37.9 C)] 100.1 F (37.8 C) (11/20 0600) Pulse Rate:  [46-106] 87  (11/20 0600) Resp:  [8-20] 18  (11/20 0803) BP: (66-143)/(38-90) 120/73 mmHg (11/20 0600) SpO2:  [87 %-100 %] 97 % (11/20 0803) Weight:  [91.173 kg (201 lb)] 201 lb (91.173 kg) (11/19 1630)  Intake/Output from previous day:  Intake/Output Summary (Last 24 hours) at 07/11/11 0912 Last data filed at 07/11/11 0981  Gross per 24 hour  Intake   2860 ml  Output   2980 ml  Net   -120 ml    Intake/Output this shift:    Labs: Results for orders placed during the hospital encounter of 07/10/11  TYPE AND SCREEN      Component Value Range   ABO/RH(D) A POS     Antibody Screen NEG     Sample Expiration 07/13/2011    ABO/RH      Component Value Range   ABO/RH(D) A POS    CBC      Component Value Range   WBC 10.4  4.0 - 10.5 (K/uL)   RBC 2.88 (*) 3.87 - 5.11 (MIL/uL)   Hemoglobin 9.5 (*) 12.0 - 15.0 (g/dL)   HCT 19.1 (*) 47.8 - 46.0 (%)   MCV 98.6  78.0 - 100.0 (fL)   MCH 33.0  26.0 - 34.0 (pg)   MCHC 33.5  30.0 - 36.0 (g/dL)   RDW 29.5  62.1 - 30.8 (%)   Platelets 132 (*) 150 - 400 (K/uL)  BASIC METABOLIC PANEL      Component Value Range   Sodium 133 (*) 135 - 145 (mEq/L)   Potassium 4.2  3.5 - 5.1 (mEq/L)   Chloride 102  96 - 112 (mEq/L)   CO2 25  19 - 32 (mEq/L)   Glucose, Bld 136 (*) 70 - 99 (mg/dL)   BUN 14  6 - 23 (mg/dL)   Creatinine, Ser 6.57  0.50 - 1.10 (mg/dL)   Calcium 9.4  8.4 - 84.6 (mg/dL)   GFR calc non Af Amer 70 (*) >90 (mL/min)   GFR calc Af Amer 81 (*) >90 (mL/min)    Exam - Neurovascular intact Sensation intact distally Intact pulses distally Dressing - clean, dry, no drainage Motor function intact - moving foot and toes well on exam.  Hemovac pulled without difficulty.  Assessment/Plan: 1 Day Post-Op Procedure(s) (LRB): TOTAL KNEE ARTHROPLASTY (Right)  Past Medical History  Diagnosis Date  . Degenerative disc disease     HERNITAED DISC BY MRI ON 07/07;S/P LAMINECTOMY,RID REPLACEMENT  . Brain aneurysm     S/P RESECTION  . Discoid lupus erythematosus eyelid     NEGATIVE SEROLOGIC  MARKERS  . Hypercholesterolemia   . Chronic sinusitis   . Hypertension   . Complication of anesthesia 05/30/2010    from colonoscopy-had Propofol-had a  . Complication of anesthesia     reaction-went into asthma-pneumonia,-  . Complication of anesthesia     then CHF and to ICU  . Dysrhythmia     PVC's occ.  . Pneumonia 05/30/2010    after receiving Propofol  . Headache     migraines occ.  . CHF (congestive heart failure) 05/2010    after receiving Propofol  . Hypothyroidism     takes Synthroid  . Blood transfusion 2009    back surgery  . Sleep apnea     uses CPAP-8.4  . Asthma 06/2010    depending on what around    Advance diet Up with therapy Discharge home with home health when met goals  DVT Prophylaxis - Xarelto Protocol Weight-Bearing as tolerated to right leg Was going to keep foley until tomorrow but it has been removed early by staff.  Will hold off on putting foley back in for now and see how she does. No vaccines.   [name], [name] 07/11/2011, 9:12 AM

## 2011-07-11 NOTE — Progress Notes (Signed)
Pt placed on Cpap with 02 bleed in at 4L, RT to assist as needed.

## 2011-07-11 NOTE — Progress Notes (Addendum)
Physical Therapy Evaluation Patient Details Name: [name] MRN: [id number] DOB: [birth year] Today's Date: 07/11/2011 Time: [phone] Charge: Vanita Ingles, TA  Problem List:  Patient Active Problem List  Diagnoses  . Hypertension  . Brain aneurysm  . Chronic back pain greater than 3 months duration  . Myalgia and myositis, unspecified  . Cramps, muscle, general  . Elevated blood sugar  . Breast screening, unspecified    Past Medical History:  Past Medical History  Diagnosis Date  . Degenerative disc disease     HERNITAED DISC BY MRI ON 07/07;S/P LAMINECTOMY,RID REPLACEMENT  . Brain aneurysm     S/P RESECTION  . Discoid lupus erythematosus eyelid     NEGATIVE SEROLOGIC MARKERS  . Hypercholesterolemia   . Chronic sinusitis   . Hypertension   . Complication of anesthesia 05/30/2010    from colonoscopy-had Propofol-had a  . Complication of anesthesia     reaction-went into asthma-pneumonia,-  . Complication of anesthesia     then CHF and to ICU  . Dysrhythmia     PVC's occ.  . Pneumonia 05/30/2010    after receiving Propofol  . Headache     migraines occ.  . CHF (congestive heart failure) 05/2010    after receiving Propofol  . Hypothyroidism     takes Synthroid  . Blood transfusion 2009    back surgery  . Sleep apnea     uses CPAP-8.4  . Asthma 06/2010    depending on what around   Past Surgical History:  Past Surgical History  Procedure Date  . Cosmetic surgery   . Tonsillectomy 1963  . Back surgery 1968,1988,2002,2009    4 laminectomies and fusions  . Abdominal hysterectomy 1988    uterus only  . Brain surgery 2005    coiling of cranial aneurysum  . Excision morton's neuroma 1990    left foot  . Nasal sinus surgery 1977-2008  . Lipomas 1975    3 from right breast  . Tympanic membrane repair 2009-last one    x5 times    PT Assessment/Plan/Recommendation PT Assessment Clinical Impression Statement: Pt s/p R TKA and would benefit from acute skilled PT in  order to improve strength and ROM of R LE to improve transfers and ambulation for safe D/C home with spouse. PT Recommendation/Assessment: Patient will need skilled PT in the acute care venue PT Problem List: Decreased strength;Decreased range of motion;Decreased mobility;Decreased knowledge of precautions;Decreased knowledge of use of DME;Pain PT Therapy Diagnosis : Difficulty walking;Acute pain PT Plan PT Frequency: 7X/week PT Treatment/Interventions: DME instruction;Gait training;Stair training;Functional mobility training;Therapeutic exercise;Patient/family education PT Recommendation Follow Up Recommendations: Home health PT Equipment Recommended: None recommended by PT PT Goals  Acute Rehab PT Goals PT Goal Formulation: With patient Time For Goal Achievement: 7 days Pt will go Supine/Side to Sit: with supervision PT Goal: Supine/Side to Sit - Progress: Progressing toward goal Pt will Transfer Sit to Stand/Stand to Sit: with supervision PT Transfer Goal: Sit to Stand/Stand to Sit - Progress: Progressing toward goal Pt will Go Up / Down Stairs: 3-5 stairs;with rolling walker;with min assist PT Goal: Up/Down Stairs - Progress: Other (comment) Pt will Perform Home Exercise Program: with supervision, verbal cues required/provided PT Goal: Perform Home Exercise Program - Progress: Other (comment)  PT Evaluation Precautions/Restrictions  Precautions Precautions: Knee Required Braces or Orthoses: Yes Knee Immobilizer: Discontinue once straight leg raise with < 10 degree lag Restrictions Weight Bearing Restrictions: Yes RLE Weight Bearing: Weight bearing as tolerated Prior Functioning  Home  Living Lives With: Spouse Type of Home: House Home Layout: Multi-level;Able to live on main level with bedroom/bathroom Home Access: Stairs to enter Entrance Stairs-Rails: Right Entrance Stairs-Number of Steps: 5 Home Adaptive Equipment: Straight cane;Walker - rolling;Bedside  commode/3-in-1 Prior Function Level of Independence: Independent with basic ADLs;Independent with gait Vocation: Retired Chief Strategy Officer RLE Assessment RLE Assessment: Exceptions to [facility] RLE Strength RLE Overall Strength Comments: kept in KI, TBA LLE Assessment LLE Assessment: Within Functional Limits Mobility (including Balance) Bed Mobility Bed Mobility: Yes Supine to Sit: 4: Min assist;HOB elevated (Comment degrees) Supine to Sit Details (indicate cue type and reason): HOB 45*, increased verbal cues for technique, assist for R LE Sitting - Scoot to Edge of Bed: 4: Min assist Sitting - Scoot to [place] of Bed Details (indicate cue type and reason): assist to scoot R hip forward to EOB Transfers Transfers: Yes Sit to Stand: 4: Min assist;From elevated surface;From bed;From chair/3-in-1;With upper extremity assist Sit to Stand Details (indicate cue type and reason): verbal cues for hand placement, R LE forward Stand to Sit: With upper extremity assist;To chair/3-in-1;With armrests;4: Min assist Stand to Sit Details: verbal cues for hand placement, R LE forward, assist to control descent Stand Pivot Transfers: 4: Min assist Stand Pivot Transfer Details (indicate cue type and reason): assist for balance, verbal cues for technique with turning/sequence Ambulation/Gait Ambulation/Gait: Yes Ambulation/Gait Assistance: 4: Min assist Ambulation/Gait Assistance Details (indicate cue type and reason): assist for steadying, increased verbal cues for technique, pt took 8-10 steps while turning from [facility] to recliner Ambulation Distance (Feet): 3 Feet Assistive device: Rolling walker Gait Pattern: Antalgic    Exercise    End of Session PT - End of Session Equipment Utilized During Treatment: Gait belt;Right knee immobilizer Activity Tolerance: Patient limited by pain Patient left: in chair;with call bell in reach General Behavior During Session:  [facility] for tasks performed Cognition: [facility] for tasks performed  [name],[name] 07/11/2011, 12:21 PM Pager: [phone]

## 2011-07-11 NOTE — Progress Notes (Signed)
PT Cancellation Note  Pt declined PT this afternoon since she was in CPM and reported "It feels good, so I'd like to just stay in it right now."  Will perform exercises tomorrow.  Signature: Tamala Ser, PT, DPT Pager: [phone] 07/11/2011

## 2011-07-11 NOTE — Progress Notes (Signed)
Set up patient on autocpap 5-20 with oxygen bleed in and patients home mask. Patient stated she would place self on CPAP when ready.

## 2011-07-12 ENCOUNTER — Encounter (HOSPITAL_COMMUNITY): Payer: Self-pay | Admitting: Orthopedic Surgery

## 2011-07-12 DIAGNOSIS — M1710 Unilateral primary osteoarthritis, unspecified knee: Secondary | ICD-10-CM

## 2011-07-12 LAB — BASIC METABOLIC PANEL
CO2: 27 mEq/L (ref 19–32)
Calcium: 9.4 mg/dL (ref 8.4–10.5)
Chloride: 97 mEq/L (ref 96–112)
Creatinine, Ser: 0.73 mg/dL (ref 0.50–1.10)
GFR calc Af Amer: >90 mL/min (ref >90)
Sodium: 130 mEq/L — ABNORMAL LOW (ref 135–145)

## 2011-07-12 LAB — CBC
HCT: 34 % — ABNORMAL LOW (ref 36.0–46.0)
Hemoglobin: 11.5 g/dL — ABNORMAL LOW (ref 12.0–15.0)
MCH: 32.9 pg (ref 26.0–34.0)
MCV: 96.4 fL (ref 78.0–100.0)
Platelets: 113 10*3/uL — ABNORMAL LOW (ref 150–400)
RBC: 2.49 MIL/uL — ABNORMAL LOW (ref 3.87–5.11)
RBC: 3.64 MIL/uL — ABNORMAL LOW (ref 3.87–5.11)
RDW: 12.1 % (ref 11.5–15.5)
WBC: 11 10*3/uL — ABNORMAL HIGH (ref 4.0–10.5)
WBC: 9.6 10*3/uL (ref 4.0–10.5)

## 2011-07-12 LAB — PREPARE RBC (CROSSMATCH)

## 2011-07-12 MED ORDER — ACETAMINOPHEN 10 MG/ML IV SOLN
1000.0000 mg | Freq: Once | INTRAVENOUS | Status: AC
  Administered 2011-07-12: 1000 mg via INTRAVENOUS
  Filled 2011-07-12: qty 100

## 2011-07-12 MED ORDER — FUROSEMIDE 10 MG/ML IJ SOLN
10.0000 mg | Freq: Once | INTRAMUSCULAR | Status: AC
  Administered 2011-07-12: 10 mg via INTRAVENOUS
  Filled 2011-07-12 (×2): qty 1

## 2011-07-12 MED ORDER — TRAMADOL HCL 50 MG PO TABS
50.0000 mg | ORAL_TABLET | Freq: Four times a day (QID) | ORAL | Status: DC | PRN
  Administered 2011-07-12: 100 mg via ORAL
  Administered 2011-07-13: 50 mg via ORAL
  Administered 2011-07-13 – 2011-07-14 (×2): 100 mg via ORAL
  Filled 2011-07-12 (×2): qty 2
  Filled 2011-07-12: qty 1
  Filled 2011-07-12: qty 2

## 2011-07-12 MED ORDER — ACETAMINOPHEN 325 MG PO TABS: 650.0000 mg | ORAL_TABLET | Freq: Once | ORAL | Status: DC

## 2011-07-12 MED ORDER — ACETAMINOPHEN 10 MG/ML IV SOLN: 1000.0000 mg | Freq: Once | INTRAVENOUS | Status: DC

## 2011-07-12 MED ORDER — ACETAMINOPHEN 10 MG/ML IV SOLN: 1000.0000 mg | Freq: Four times a day (QID) | INTRAVENOUS | Status: DC

## 2011-07-12 MED ORDER — DIAZEPAM 5 MG PO TABS
ORAL_TABLET | ORAL | Status: AC
  Administered 2011-07-12: 5 mg via ORAL
  Filled 2011-07-12: qty 1

## 2011-07-12 NOTE — Progress Notes (Signed)
Physical Therapy Treatment Patient Details Name: [name] MRN: [id number] DOB: [birth year] Today's Date: 07/12/2011 Time: 900-923 Charge: TA 2 PT Assessment/Plan  PT - Assessment/Plan Comments on Treatment Session: Pt unable to ambulate today 2* safety with increased confusion, but pt did get up to [facility] then assisted to recliner. PT Plan: Discharge plan remains appropriate;Frequency remains appropriate Follow Up Recommendations: Home health PT Equipment Recommended: None recommended by PT PT Goals  Acute Rehab PT Goals PT Goal: Supine/Side to Sit - Progress: Progressing toward goal PT Transfer Goal: Sit to Stand/Stand to Sit - Progress: Progressing toward goal  PT Treatment Precautions/Restrictions  Precautions Precautions: Knee Required Braces or Orthoses: Yes Knee Immobilizer: Discontinue once straight leg raise with < 10 degree lag Restrictions Weight Bearing Restrictions: Yes RLE Weight Bearing: Weight bearing as tolerated Mobility (including Balance) Bed Mobility Bed Mobility: Yes Supine to Sit: 4: Min assist;HOB elevated (Comment degrees);With rails Supine to Sit Details (indicate cue type and reason): HOB 45*, increased verbal cues for technique, assist for R LE Sitting - Scoot to Edge of Bed: 4: Min assist Sitting - Scoot to Edge of Bed Details (indicate cue type and reason): assist for R hip scoot Transfers Transfers: Yes Sit to Stand: 4: Min assist;From elevated surface;From bed;From chair/3-in-1;With upper extremity assist Sit to Stand Details (indicate cue type and reason): verbal cues for R LE forward and hand placement Stand to Sit: With upper extremity assist;To chair/3-in-1;With armrests;4: Min assist Stand to Sit Details: verbal cues for hand placement, R LE forward, assist to control descent Stand Pivot Transfers: 4: Min assist Stand Pivot Transfer Details (indicate cue type and reason): assist for balance, verbal cues for technique with  turning/sequence Ambulation/Gait Ambulation/Gait: Yes Ambulation/Gait Assistance: 4: Min assist Ambulation/Gait Assistance Details (indicate cue type and reason): assist for steadying, increased verbal cues for technique, pt took 8-10 steps while turning from [facility] to recliner, pt slower today with same transfer as yesterday as pt not safe to ambulate today. Ambulation Distance (Feet): 3 Feet Assistive device: Rolling walker Gait Pattern: Antalgic    Exercise    End of Session PT - End of Session Equipment Utilized During Treatment: Gait belt;Right knee immobilizer Activity Tolerance: Patient limited by pain Patient left: in chair;with call bell in reach General Behavior During Session:  (Pt more confused today and repetitious thoughts verbalized.) Cognition: Impaired Cognitive Impairment: Pt more confused today and stating multiple concerns about being hot and wet gown even after these things were addressed.  Pt also asking repeated questions. RN already aware of confusion.  Holmes Hays,KATHrine E 07/12/2011, 2:48 PM Pager: [phone]

## 2011-07-12 NOTE — Progress Notes (Signed)
OT Note:  Unable to see pt between units of blood.  Pt now confused per RN.  Will reattempt tomorrow. [place], OTR/L [phone] 07/12/2011

## 2011-07-12 NOTE — Clinical Documentation Improvement (Signed)
Anemia Blood Loss Clarification  Dear Dr. Lequita Halt [place]on [place]  In an effort to better capture your patient's severity of illness, reflect appropriate length of stay and utilization of resources, a review of the patient medical record has revealed the following indicators.   Based on your clinical judgment, please clarify and document in a progress note and/or discharge summary the clinical condition associated with the following supporting information: In responding to this query please exercise your independent judgment.  The fact that a query is asked, does not imply that any particular answer is desired or expected.  >Please clarify the underlying condition responsible for the abnormal H/H lab values. Thank you for your exceptional documentation.  Possible Clinical Conditions?   " Expected Acute Blood Loss Anemia  " Acute Blood Loss Anemia " Acute on chronic blood loss anemia  " Chronic blood loss anemia  " Precipitous drop in Hematocrit  " Other Condition________________  " Cannot Clinically Determine   Clinical Information:  Risk Factors: recent surgery: TOTAL KNEE ARTHROPLASTY Right  Signs and Symptoms : decreased trending in labs  Post OP    H&H: 07/11/11 Hgb 9.5 Hct 28.4                            07/12/11 Hgb 8.2 Hct 24.0  Transfusion: scheduled 07/12/11  Serial H&H monitoring This is not a permanent part of the medical record.  Reviewed: additional documentation in the medical record   Thank You,  Sincerely, Andy Gauss RN   Clinical Documentation Specialist:  Pager  [phone]  Health Information Management [place]  Done in discharge summary

## 2011-07-12 NOTE — Progress Notes (Signed)
. Subjective: 2 Days Post-Op Procedure(s) (LRB): TOTAL KNEE ARTHROPLASTY (Right) Patient reports pain as mild.   Patient seen in rounds with Dr. Lequita Halt. Patient has complaints of some pain. Hbg is down more today. Discussed blood and will transfuse today.  Wife in room.  Felt he would benefit from blood.  Objective: Vital signs in last 24 hours: Temp:  [98.9 F (37.2 C)-101.9 F (38.8 C)] 99.5 F (37.5 C) (11/21 0600) Pulse Rate:  [78-97] 79  (11/21 0600) Resp:  [16-18] 18  (11/21 0600) BP: (122-160)/(66-81) 130/76 mmHg (11/21 0600) SpO2:  [90 %-97 %] 94 % (11/21 0600) FiO2 (%):  [28 %] 28 % (11/20 2112)  Intake/Output from previous day:  Intake/Output Summary (Last 24 hours) at 07/12/11 0729 Last data filed at 07/12/11 0500  Gross per 24 hour  Intake    960 ml  Output   1200 ml  Net   -240 ml    Intake/Output this shift:    Labs: Results for orders placed during the hospital encounter of 07/10/11  TYPE AND SCREEN      Component Value Range   ABO/RH(D) A POS     Antibody Screen NEG     Sample Expiration 07/13/2011    ABO/RH      Component Value Range   ABO/RH(D) A POS    CBC      Component Value Range   WBC 10.4  4.0 - 10.5 (K/uL)   RBC 2.88 (*) 3.87 - 5.11 (MIL/uL)   Hemoglobin 9.5 (*) 12.0 - 15.0 (g/dL)   HCT 56.2 (*) 13.0 - 46.0 (%)   MCV 98.6  78.0 - 100.0 (fL)   MCH 33.0  26.0 - 34.0 (pg)   MCHC 33.5  30.0 - 36.0 (g/dL)   RDW 86.5  78.4 - 69.6 (%)   Platelets 132 (*) 150 - 400 (K/uL)  BASIC METABOLIC PANEL      Component Value Range   Sodium 133 (*) 135 - 145 (mEq/L)   Potassium 4.2  3.5 - 5.1 (mEq/L)   Chloride 102  96 - 112 (mEq/L)   CO2 25  19 - 32 (mEq/L)   Glucose, Bld 136 (*) 70 - 99 (mg/dL)   BUN 14  6 - 23 (mg/dL)   Creatinine, Ser 2.95  0.50 - 1.10 (mg/dL)   Calcium 9.4  8.4 - 28.4 (mg/dL)   GFR calc non Af Amer 70 (*) >90 (mL/min)   GFR calc Af Amer 81 (*) >90 (mL/min)  CBC      Component Value Range   WBC 11.0 (*) 4.0 - 10.5 (K/uL)     RBC 2.49 (*) 3.87 - 5.11 (MIL/uL)   Hemoglobin 8.2 (*) 12.0 - 15.0 (g/dL)   HCT 13.2 (*) 44.0 - 46.0 (%)   MCV 96.4  78.0 - 100.0 (fL)   MCH 32.9  26.0 - 34.0 (pg)   MCHC 34.2  30.0 - 36.0 (g/dL)   RDW 10.2  72.5 - 36.6 (%)   Platelets 113 (*) 150 - 400 (K/uL)  BASIC METABOLIC PANEL      Component Value Range   Sodium 130 (*) 135 - 145 (mEq/L)   Potassium 4.0  3.5 - 5.1 (mEq/L)   Chloride 97  96 - 112 (mEq/L)   CO2 27  19 - 32 (mEq/L)   Glucose, Bld 132 (*) 70 - 99 (mg/dL)   BUN 8  6 - 23 (mg/dL)   Creatinine, Ser 4.40  0.50 - 1.10 (mg/dL)   Calcium  9.4  8.4 - 10.5 (mg/dL)   GFR calc non Af Amer 86 (*) >90 (mL/min)   GFR calc Af Amer >90  >90 (mL/min)    Exam - Neurovascular intact Sensation intact distally Intact pulses distally Dressing/Incision - clean, dry, no drainage Motor function intact - moving foot and toes well on exam.   Assessment/Plan: 2 Days Post-Op Procedure(s) (LRB): TOTAL KNEE ARTHROPLASTY (Right)  Past Medical History  Diagnosis Date  . Degenerative disc disease     HERNITAED DISC BY MRI ON 07/07;S/P LAMINECTOMY,RID REPLACEMENT  . Brain aneurysm     S/P RESECTION  . Discoid lupus erythematosus eyelid     NEGATIVE SEROLOGIC MARKERS  . Hypercholesterolemia   . Chronic sinusitis   . Hypertension   . Complication of anesthesia 05/30/2010    from colonoscopy-had Propofol-had a  . Complication of anesthesia     reaction-went into asthma-pneumonia,-  . Complication of anesthesia     then CHF and to ICU  . Dysrhythmia     PVC's occ.  . Pneumonia 05/30/2010    after receiving Propofol  . Headache     migraines occ.  . CHF (congestive heart failure) 05/2010    after receiving Propofol  . Hypothyroidism     takes Synthroid  . Blood transfusion 2009    back surgery  . Sleep apnea     uses CPAP-8.4  . Asthma 06/2010    depending on what around    Advance diet Discharge home with home health when met goals probably tomorrow  DVT  Prophylaxis - Xarelto / Coumadin Protocol Partial-Weight Bearing 25-50% left Leg  [name], [name] 07/12/2011, 7:29 AM

## 2011-07-12 NOTE — Progress Notes (Signed)
PT Cancellation Note  Pt with increased confusion this afternoon per OT, so did not attempt 2nd visit for exercises.   Signature: Tamala Ser, PT, DPT Pager: [phone] 07/12/2011

## 2011-07-12 NOTE — Progress Notes (Signed)
CM received referral.  See [facility] note in shadow chart. 

## 2011-07-13 LAB — BASIC METABOLIC PANEL
Chloride: 99 mEq/L (ref 96–112)
GFR calc Af Amer: >90 mL/min (ref >90)
GFR calc non Af Amer: 84 mL/min — ABNORMAL LOW (ref >90)
Potassium: 3.6 mEq/L (ref 3.5–5.1)
Sodium: 134 mEq/L — ABNORMAL LOW (ref 135–145)

## 2011-07-13 LAB — TYPE AND SCREEN: Unit division: 0

## 2011-07-13 LAB — CBC
HCT: 30.2 % — ABNORMAL LOW (ref 36.0–46.0)
MCHC: 34.8 g/dL (ref 30.0–36.0)
RDW: 14.5 % (ref 11.5–15.5)
WBC: 9.3 10*3/uL (ref 4.0–10.5)

## 2011-07-13 MED ORDER — DIAZEPAM 5 MG PO TABS
ORAL_TABLET | ORAL | Status: AC
  Filled 2011-07-13: qty 1

## 2011-07-13 MED ORDER — LIDOCAINE 5 % EX PTCH
1.0000 | MEDICATED_PATCH | CUTANEOUS | Status: DC
  Administered 2011-07-13: 1 via TRANSDERMAL
  Filled 2011-07-13 (×3): qty 1

## 2011-07-13 NOTE — Progress Notes (Signed)
Order received, chart reviewed, spoke with pt and husband about role of OT. She and husband report that they can manage BADLs without issue and pt has a 3-n-1 and shower seat at home. Pt reports husband just assisted her to the bathroom without issue. No OT needs identified, acute OT signing off.  Ignacia Palma, [place] [phone] 07/13/2011

## 2011-07-13 NOTE — Progress Notes (Signed)
Physical Therapy Treatment Patient Details Name: JOHNYE KIST MRN: [id number] DOB: [birth year] Today's Date: 07/13/2011 2G, TE 11:05-11:45  PT Assessment/Plan  PT - Assessment/Plan Comments on Treatment Session: Pt progressing with mobility. Not confused today. PT Plan: Discharge plan remains appropriate;Frequency remains appropriate PT Frequency: 7X/week Follow Up Recommendations: Home health PT Equipment Recommended: None recommended by PT PT Goals  Acute Rehab PT Goals PT Goal Formulation: With patient Time For Goal Achievement: 7 days Pt will go Supine/Side to Sit: with supervision PT Goal: Supine/Side to Sit - Progress: Progressing toward goal Pt will Transfer Sit to Stand/Stand to Sit: with supervision PT Transfer Goal: Sit to Stand/Stand to Sit - Progress: Progressing toward goal Pt will Go Up / Down Stairs: 3-5 stairs;with rolling walker;with min assist Pt will Perform Home Exercise Program: with supervision, verbal cues required/provided PT Goal: Perform Home Exercise Program - Progress: Progressing toward goal  PT Treatment Precautions/Restrictions  Precautions Precautions: Knee Required Braces or Orthoses: Yes Knee Immobilizer: Discontinue once straight leg raise with < 10 degree lag Restrictions Weight Bearing Restrictions: Yes RLE Weight Bearing: Weight bearing as tolerated Mobility (including Balance) Bed Mobility Bed Mobility: Yes Supine to Sit: 3: Mod assist;HOB flat Supine to Sit Details (indicate cue type and reason): assist to elevate trunk, & for RLE, pt 60% Sitting - Scoot to Edge of Bed: 4: Min assist Sitting - Scoot to [place] of Bed Details (indicate cue type and reason): assist to advance R hip Transfers Transfers: Yes Sit to Stand: 4: Min assist;From elevated surface;From bed;From chair/3-in-1;With upper extremity assist Sit to Stand Details (indicate cue type and reason): VCs hand placement Stand to Sit: With upper extremity assist;To  chair/3-in-1;With armrests;4: Min assist Stand to Sit Details: VCs hand placement Ambulation/Gait Ambulation/Gait: Yes Ambulation/Gait Assistance: 5: Supervision Ambulation Distance (Feet): 30 Feet Assistive device: Rolling walker Gait Pattern: Antalgic    Exercise    End of Session PT - End of Session Equipment Utilized During Treatment: Gait belt;Right knee immobilizer Activity Tolerance: Patient limited by pain Patient left: in chair;with call bell in reach General Behavior During Session: [facility] for tasks performed Cognition: [facility] for tasks performed Ralene Bathe Kistler PT 07/13/2011  [phone]  Tamala Ser 07/13/2011, 11:49 AM

## 2011-07-13 NOTE — Progress Notes (Signed)
Subjective: 3 Days Post-Op Procedure(s) (LRB): TOTAL KNEE ARTHROPLASTY (Right) Patient reports pain as mild.   Patient seen in rounds with Dr. Lequita Halt. Patient feels much better today. More alert and ready to ambulate today  Objective: Vital signs in last 24 hours: Temp:  [98.6 F (37 C)-101.5 F (38.6 C)] 98.9 F (37.2 C) (11/22 0600) Pulse Rate:  [68-92] 87  (11/22 0600) Resp:  [18-20] 20  (11/22 0600) BP: (116-167)/(64-79) 147/75 mmHg (11/22 0600) SpO2:  [91 %-98 %] 98 % (11/22 0600)  Intake/Output from previous day:  Intake/Output Summary (Last 24 hours) at 07/13/11 0810 Last data filed at 07/13/11 1191  Gross per 24 hour  Intake   1522 ml  Output   4000 ml  Net  -2478 ml    Intake/Output this shift: Total I/O In: -  Out: 800 [Urine:800]  Labs: Results for orders placed during the hospital encounter of 07/10/11  TYPE AND SCREEN      Component Value Range   ABO/RH(D) A POS     Antibody Screen NEG     Sample Expiration 07/13/2011     Unit Number [id number]     Blood Component Type RED CELLS,LR     Unit division 00     Status of Unit ISSUED     Transfusion Status OK TO TRANSFUSE     Crossmatch Result Compatible     Unit Number [id number]     Blood Component Type RED CELLS,LR     Unit division 00     Status of Unit ISSUED     Transfusion Status OK TO TRANSFUSE     Crossmatch Result Compatible    ABO/RH      Component Value Range   ABO/RH(D) A POS    CBC      Component Value Range   WBC 10.4  4.0 - 10.5 (K/uL)   RBC 2.88 (*) 3.87 - 5.11 (MIL/uL)   Hemoglobin 9.5 (*) 12.0 - 15.0 (g/dL)   HCT 96.2 (*) 95.2 - 46.0 (%)   MCV 98.6  78.0 - 100.0 (fL)   MCH 33.0  26.0 - 34.0 (pg)   MCHC 33.5  30.0 - 36.0 (g/dL)   RDW 84.1  32.4 - 40.1 (%)   Platelets 132 (*) 150 - 400 (K/uL)  BASIC METABOLIC PANEL      Component Value Range   Sodium 133 (*) 135 - 145 (mEq/L)   Potassium 4.2  3.5 - 5.1 (mEq/L)   Chloride 102  96 - 112 (mEq/L)   CO2 25  19 - 32 (mEq/L)   Glucose, Bld 136 (*) 70 - 99 (mg/dL)   BUN 14  6 - 23 (mg/dL)   Creatinine, Ser 0.27  0.50 - 1.10 (mg/dL)   Calcium 9.4  8.4 - 25.3 (mg/dL)   GFR calc non Af Amer 70 (*) >90 (mL/min)   GFR calc Af Amer 81 (*) >90 (mL/min)  CBC      Component Value Range   WBC 11.0 (*) 4.0 - 10.5 (K/uL)   RBC 2.49 (*) 3.87 - 5.11 (MIL/uL)   Hemoglobin 8.2 (*) 12.0 - 15.0 (g/dL)   HCT 66.4 (*) 40.3 - 46.0 (%)   MCV 96.4  78.0 - 100.0 (fL)   MCH 32.9  26.0 - 34.0 (pg)   MCHC 34.2  30.0 - 36.0 (g/dL)   RDW 47.4  25.9 - 56.3 (%)   Platelets 113 (*) 150 - 400 (K/uL)  BASIC METABOLIC PANEL      Component  Value Range   Sodium 130 (*) 135 - 145 (mEq/L)   Potassium 4.0  3.5 - 5.1 (mEq/L)   Chloride 97  96 - 112 (mEq/L)   CO2 27  19 - 32 (mEq/L)   Glucose, Bld 132 (*) 70 - 99 (mg/dL)   BUN 8  6 - 23 (mg/dL)   Creatinine, Ser 0.86  0.50 - 1.10 (mg/dL)   Calcium 9.4  8.4 - 57.8 (mg/dL)   GFR calc non Af Amer 86 (*) >90 (mL/min)   GFR calc Af Amer >90  >90 (mL/min)  PREPARE RBC (CROSSMATCH)      Component Value Range   Order Confirmation ORDER PROCESSED BY BLOOD BANK    CBC      Component Value Range   WBC 9.3  4.0 - 10.5 (K/uL)   RBC 3.28 (*) 3.87 - 5.11 (MIL/uL)   Hemoglobin 10.5 (*) 12.0 - 15.0 (g/dL)   HCT 46.9 (*) 62.9 - 46.0 (%)   MCV 92.1  78.0 - 100.0 (fL)   MCH 32.0  26.0 - 34.0 (pg)   MCHC 34.8  30.0 - 36.0 (g/dL)   RDW 52.8  41.3 - 24.4 (%)   Platelets 121 (*) 150 - 400 (K/uL)  BASIC METABOLIC PANEL      Component Value Range   Sodium 134 (*) 135 - 145 (mEq/L)   Potassium 3.6  3.5 - 5.1 (mEq/L)   Chloride 99  96 - 112 (mEq/L)   CO2 29  19 - 32 (mEq/L)   Glucose, Bld 95  70 - 99 (mg/dL)   BUN 12  6 - 23 (mg/dL)   Creatinine, Ser 0.10  0.50 - 1.10 (mg/dL)   Calcium 9.5  8.4 - 27.2 (mg/dL)   GFR calc non Af Amer 84 (*) >90 (mL/min)   GFR calc Af Amer >90  >90 (mL/min)  CBC      Component Value Range   WBC 9.6  4.0 - 10.5 (K/uL)   RBC 3.64 (*) 3.87 - 5.11 (MIL/uL)   Hemoglobin 11.5  (*) 12.0 - 15.0 (g/dL)   HCT 53.6 (*) 64.4 - 46.0 (%)   MCV 93.4  78.0 - 100.0 (fL)   MCH 31.6  26.0 - 34.0 (pg)   MCHC 33.8  30.0 - 36.0 (g/dL)   RDW 03.4  74.2 - 59.5 (%)   Platelets 129 (*) 150 - 400 (K/uL)    Exam - Neurologically intact Dressing/Incision - clean, dry, no drainage, moderate swelling and bruising but no erythema or cellulitis Motor function intact - moving foot and toes well on exam.  Much more alert and oriented x 3 today  Assessment/Plan: 3 Days Post-Op Procedure(s) (LRB): TOTAL KNEE ARTHROPLASTY (Right)  Past Medical History  Diagnosis Date  . Degenerative disc disease     HERNITAED DISC BY MRI ON 07/07;S/P LAMINECTOMY,RID REPLACEMENT  . Brain aneurysm     S/P RESECTION  . Discoid lupus erythematosus eyelid     NEGATIVE SEROLOGIC MARKERS  . Hypercholesterolemia   . Chronic sinusitis   . Hypertension   . Complication of anesthesia 05/30/2010    from colonoscopy-had Propofol-had a  . Complication of anesthesia     reaction-went into asthma-pneumonia,-  . Complication of anesthesia     then CHF and to ICU  . Dysrhythmia     PVC's occ.  . Pneumonia 05/30/2010    after receiving Propofol  . Headache     migraines occ.  . CHF (congestive heart failure) 05/2010  after receiving Propofol  . Hypothyroidism     takes Synthroid  . Blood transfusion 2009    back surgery  . Sleep apnea     uses CPAP-8.4  . Asthma 06/2010    depending on what around    Up with therapy D/C IV fluids Plan for discharge tomorrow Discharge home with home health  DVT Prophylaxis - Xarelto  Protocol Weight-Bearing as tolerated to right leg  Sohail Capraro V 07/13/2011, 8:10 AM

## 2011-07-14 MED ORDER — METHOCARBAMOL 500 MG PO TABS: 500.0000 mg | ORAL_TABLET | Freq: Four times a day (QID) | ORAL | Status: AC | PRN

## 2011-07-14 MED ORDER — RIVAROXABAN 10 MG PO TABS: 10.0000 mg | ORAL_TABLET | ORAL | Status: DC

## 2011-07-14 MED ORDER — TRAMADOL HCL 50 MG PO TABS: 50.0000 mg | ORAL_TABLET | Freq: Four times a day (QID) | ORAL | Status: AC | PRN

## 2011-07-14 NOTE — Progress Notes (Signed)
CM consult done. See CM notes in shadow chart.  Daquavion Catala Wyche RN BSN CCM [phone] 07/14/2011    

## 2011-07-14 NOTE — Progress Notes (Signed)
Subjective: 4 Days Post-Op Procedure(s) (LRB): TOTAL KNEE ARTHROPLASTY (Right) Patient reports pain as mild.   Patient seen in rounds with Dr. Lequita Lewis. Patient has complaints of drainage from left ear. Has an ENT surgeon in [facility] and will contact him after discharge  Objective: Vital signs in last 24 hours: Temp:  [97.8 F (36.6 C)-98.9 F (37.2 C)] 98.2 F (36.8 C) (11/23 0500) Pulse Rate:  [63-76] 69  (11/23 0500) Resp:  [18] 18  (11/23 0500) BP: (115-164)/(65-70) 141/68 mmHg (11/23 0500) SpO2:  [92 %-98 %] 94 % (11/23 0500)  Intake/Output from previous day:  Intake/Output Summary (Last 24 hours) at 07/14/11 1610 Last data filed at 07/13/11 1400  Gross per 24 hour  Intake    160 ml  Output      0 ml  Net    160 ml    Intake/Output this shift:    Labs: Results for orders placed during the hospital encounter of 07/10/11  TYPE AND SCREEN      Component Value Range   ABO/RH(D) A POS     Antibody Screen NEG     Sample Expiration 07/13/2011     Unit Number [id number]     Blood Component Type RED CELLS,LR     Unit division 00     Status of Unit ISSUED,FINAL     Transfusion Status OK TO TRANSFUSE     Crossmatch Result Compatible     Unit Number [id number]     Blood Component Type RED CELLS,LR     Unit division 00     Status of Unit ISSUED,FINAL     Transfusion Status OK TO TRANSFUSE     Crossmatch Result Compatible    ABO/RH      Component Value Range   ABO/RH(D) A POS    CBC      Component Value Range   WBC 10.4  4.0 - 10.5 (K/uL)   RBC 2.88 (*) 3.87 - 5.11 (MIL/uL)   Hemoglobin 9.5 (*) 12.0 - 15.0 (g/dL)   HCT 56.2 (*) 13.0 - 46.0 (%)   MCV 98.6  78.0 - 100.0 (fL)   MCH 33.0  26.0 - 34.0 (pg)   MCHC 33.5  30.0 - 36.0 (g/dL)   RDW 86.5  78.4 - 69.6 (%)   Platelets 132 (*) 150 - 400 (K/uL)  BASIC METABOLIC PANEL      Component Value Range   Sodium 133 (*) 135 - 145 (mEq/L)   Potassium 4.2  3.5 - 5.1 (mEq/L)   Chloride 102  96 - 112 (mEq/L)   CO2 25   19 - 32 (mEq/L)   Glucose, Bld 136 (*) 70 - 99 (mg/dL)   BUN 14  6 - 23 (mg/dL)   Creatinine, Ser 2.95  0.50 - 1.10 (mg/dL)   Calcium 9.4  8.4 - 28.4 (mg/dL)   GFR calc non Af Amer 70 (*) >90 (mL/min)   GFR calc Af Amer 81 (*) >90 (mL/min)  CBC      Component Value Range   WBC 11.0 (*) 4.0 - 10.5 (K/uL)   RBC 2.49 (*) 3.87 - 5.11 (MIL/uL)   Hemoglobin 8.2 (*) 12.0 - 15.0 (g/dL)   HCT 13.2 (*) 44.0 - 46.0 (%)   MCV 96.4  78.0 - 100.0 (fL)   MCH 32.9  26.0 - 34.0 (pg)   MCHC 34.2  30.0 - 36.0 (g/dL)   RDW 10.2  72.5 - 36.6 (%)   Platelets 113 (*) 150 - 400 (K/uL)  BASIC METABOLIC PANEL      Component Value Range   Sodium 130 (*) 135 - 145 (mEq/L)   Potassium 4.0  3.5 - 5.1 (mEq/L)   Chloride 97  96 - 112 (mEq/L)   CO2 27  19 - 32 (mEq/L)   Glucose, Bld 132 (*) 70 - 99 (mg/dL)   BUN 8  6 - 23 (mg/dL)   Creatinine, Ser 9.14  0.50 - 1.10 (mg/dL)   Calcium 9.4  8.4 - 78.2 (mg/dL)   GFR calc non Af Amer 86 (*) >90 (mL/min)   GFR calc Af Amer >90  >90 (mL/min)  PREPARE RBC (CROSSMATCH)      Component Value Range   Order Confirmation ORDER PROCESSED BY BLOOD BANK    CBC      Component Value Range   WBC 9.3  4.0 - 10.5 (K/uL)   RBC 3.28 (*) 3.87 - 5.11 (MIL/uL)   Hemoglobin 10.5 (*) 12.0 - 15.0 (g/dL)   HCT 95.6 (*) 21.3 - 46.0 (%)   MCV 92.1  78.0 - 100.0 (fL)   MCH 32.0  26.0 - 34.0 (pg)   MCHC 34.8  30.0 - 36.0 (g/dL)   RDW 08.6  57.8 - 46.9 (%)   Platelets 121 (*) 150 - 400 (K/uL)  BASIC METABOLIC PANEL      Component Value Range   Sodium 134 (*) 135 - 145 (mEq/L)   Potassium 3.6  3.5 - 5.1 (mEq/L)   Chloride 99  96 - 112 (mEq/L)   CO2 29  19 - 32 (mEq/L)   Glucose, Bld 95  70 - 99 (mg/dL)   BUN 12  6 - 23 (mg/dL)   Creatinine, Ser 6.29  0.50 - 1.10 (mg/dL)   Calcium 9.5  8.4 - 52.8 (mg/dL)   GFR calc non Af Amer 84 (*) >90 (mL/min)   GFR calc Af Amer >90  >90 (mL/min)  CBC      Component Value Range   WBC 9.6  4.0 - 10.5 (K/uL)   RBC 3.64 (*) 3.87 - 5.11 (MIL/uL)    Hemoglobin 11.5 (*) 12.0 - 15.0 (g/dL)   HCT 41.3 (*) 24.4 - 46.0 (%)   MCV 93.4  78.0 - 100.0 (fL)   MCH 31.6  26.0 - 34.0 (pg)   MCHC 33.8  30.0 - 36.0 (g/dL)   RDW 01.0  27.2 - 53.6 (%)   Platelets 129 (*) 150 - 400 (K/uL)    Exam - Neurologically intact Neurovascular intact No cellulitis present Compartment soft Dressing/Incision - clean, dry, moderate swelling with some blistering under the steri strips inferiorly. No signs of infection Motor function intact - moving foot and toes well on exam.   Assessment/Plan: 4 Days Post-Op Procedure(s) (LRB): TOTAL KNEE ARTHROPLASTY (Right)  Past Medical History  Diagnosis Date  . Degenerative disc disease     HERNITAED DISC BY MRI ON 07/07;S/P LAMINECTOMY,RID REPLACEMENT  . Brain aneurysm     S/P RESECTION  . Discoid lupus erythematosus eyelid     NEGATIVE SEROLOGIC MARKERS  . Hypercholesterolemia   . Chronic sinusitis   . Hypertension   . Complication of anesthesia 05/30/2010    from colonoscopy-had Propofol-had a  . Complication of anesthesia     reaction-went into asthma-pneumonia,-  . Complication of anesthesia     then CHF and to ICU  . Dysrhythmia     PVC's occ.  . Pneumonia 05/30/2010    after receiving Propofol  . Headache     migraines  occ.  . CHF (congestive heart failure) 05/2010    after receiving Propofol  . Hypothyroidism     takes Synthroid  . Blood transfusion 2009    back surgery  . Sleep apnea     uses CPAP-8.4  . Asthma 06/2010    depending on what around    Discharge home with home health  DVT Prophylaxis - Xarelto Protocol Weight-Bearing as tolerated to right leg  [name] 07/14/2011, 9:21 AM

## 2011-07-14 NOTE — Progress Notes (Signed)
Physical Therapy Treatment Patient Details Name: [name] MRN: [id number] DOB: [birth year] Today's Date: 07/14/2011 [id number] 1gt/1te PT Assessment/Plan  PT - Assessment/Plan Comments on Treatment Session: Pt progressing with mobility. Not confused today. PT Plan: Discharge plan remains appropriate;Frequency remains appropriate PT Frequency: 7X/week Follow Up Recommendations: Home health PT Equipment Recommended: None recommended by PT PT Goals  Acute Rehab PT Goals PT Goal: Supine/Side to Sit - Progress: Not met PT Transfer Goal: Sit to Stand/Stand to Sit - Progress: Partly met PT Goal: Up/Down Stairs - Progress: Met PT Goal: Perform Home Exercise Program - Progress: Partly met  PT Treatment Precautions/Restrictions  Precautions Precautions: Knee Required Braces or Orthoses: Yes Knee Immobilizer: Discontinue once straight leg raise with < 10 degree lag Restrictions Weight Bearing Restrictions: Yes RLE Weight Bearing: Weight bearing as tolerated Mobility (including Balance) Bed Mobility Supine to Sit: 4: Min assist;With rails;HOB flat Supine to Sit Details (indicate cue type and reason): min assist with RLE Sitting - Scoot to Edge of Bed: 5: Supervision Transfers Sit to Stand: 4: Min assist;From bed Sit to Stand Details (indicate cue type and reason): verbal cues for hands Stand to Sit: With upper extremity assist;To chair/3-in-1;With armrests;4: Min assist Stand to Sit Details: verbal cues for hands and RLE position Ambulation/Gait Ambulation/Gait Assistance: 5: Supervision Ambulation/Gait Assistance Details (indicate cue type and reason): verbal cues RW safety Ambulation Distance (Feet): 80 Feet Assistive device: Rolling walker Gait Pattern: Antalgic Stairs: Yes Stairs Assistance: 4: Min assist Stairs Assistance Details (indicate cue type and reason): verbal cues for sequence Stair Management Technique: One rail Right;Step to pattern;Forwards Number of Stairs: 3       Exercise  Total Joint Exercises Ankle Circles/Pumps: AROM;10 reps;Both Quad Sets: AROM;10 reps;Right;Seated Heel Slides: AAROM;10 reps;Right;Seated Hip ABduction/ADduction: AROM;10 reps;Seated;Right Straight Leg Raises: AAROM;Right;10 reps;Seated End of Session PT - End of Session Equipment Utilized During Treatment: Gait belt;Right knee immobilizer Activity Tolerance: Patient tolerated treatment well Patient left: in chair;with call bell in reach;with family/visitor present General Behavior During Session: [facility] for tasks performed Cognition: [facility] for tasks performed Cognitive Impairment: pt oriented, following commands today  [facility] 07/14/2011, 1:36 PM

## 2011-07-14 NOTE — Progress Notes (Signed)
Patient discharged to home. D/C instructions gone over with patient and husband,verbalized understanding. Pt taken downstairs via wheelchair.

## 2011-07-17 ENCOUNTER — Telehealth: Payer: Self-pay | Admitting: Internal Medicine

## 2011-07-17 NOTE — Telephone Encounter (Signed)
Patient called and stated she had knee replacement last Monday and came home from the hospital on Friday.  Last night she started with a sore throat, fever (102F), and a headache.  She called Dr. Berton Lan who did the surgery and he wanted her to call her PCP to see if you would prescribe an antibiotic for her.  She does not have a cough, but patient reported she is on blood thinners.  Please advise.

## 2011-07-17 NOTE — Telephone Encounter (Signed)
Rx has been called.  Patient notified.

## 2011-07-17 NOTE — Telephone Encounter (Signed)
Yes i will rx azithromycin 500 mg daily for 7 days  @7  no refills.  plesea call in for her

## 2011-07-17 NOTE — Telephone Encounter (Signed)
Dr. Berton Lan wants dr. Darrick Huntsman to call in something for sore throat and congestion and fever.

## 2011-07-20 NOTE — Discharge Summary (Signed)
Physician Discharge Summary   Patient ID: [name] MRN: [id number] DOB/AGE: 26-Jan-1941 70 y.o.  Admit date: 07/10/2011 Discharge date: 07/14/2011  Primary Diagnosis: Osteoarthritis Right Knee  Admission Diagnoses: Past Medical History  Diagnosis Date  . Degenerative disc disease     HERNITAED DISC BY MRI ON 07/07;S/P LAMINECTOMY,RID REPLACEMENT  . Brain aneurysm     S/P RESECTION  . Discoid lupus erythematosus eyelid     NEGATIVE SEROLOGIC MARKERS  . Hypercholesterolemia   . Chronic sinusitis   . Hypertension   . Complication of anesthesia 05/30/2010    from colonoscopy-had Propofol-had a  . Complication of anesthesia     reaction-went into asthma-pneumonia,-  . Complication of anesthesia     then CHF and to ICU  . Dysrhythmia     PVC's occ.  . Pneumonia 05/30/2010    after receiving Propofol  . Headache     migraines occ.  . CHF (congestive heart failure) 05/2010    after receiving Propofol  . Hypothyroidism     takes Synthroid  . Blood transfusion 2009    back surgery  . Sleep apnea     uses CPAP-8.4  . Asthma 06/2010    depending on what around    Discharge Diagnoses:  Principal Problem:  *Osteoarthritis of right knee Postop ABLA S/P Transfusion Postop Hyponatremia  Procedure: Procedure(s) (LRB): TOTAL KNEE ARTHROPLASTY (Right)   Consults: none  HPI:  [name] is a 70 y.o. year old female with end stage OA of her right knee with progressively worsening pain and dysfunction. She has constant pain, with activity and at rest and significant functional deficits with difficulties even with ADLs. She has had extensive non-op management including analgesics, injections of cortisone and viscosupplements, and home exercise program, but remains in significant pain with significant dysfunction. Her x-rays show bone on bone arthritis of medial and patellofemoral compartments with lateral narrowing and subchondral sclerosis. She presents now for left Total  Knee Arthroplasty.   Laboratory Data: Results for orders placed during the hospital encounter of 07/10/11  TYPE AND SCREEN      Component Value Range   ABO/RH(D) A POS     Antibody Screen NEG     Sample Expiration 07/13/2011     Unit Number [id number]     Blood Component Type RED CELLS,LR     Unit division 00     Status of Unit ISSUED,FINAL     Transfusion Status OK TO TRANSFUSE     Crossmatch Result Compatible     Unit Number [id number]     Blood Component Type RED CELLS,LR     Unit division 00     Status of Unit ISSUED,FINAL     Transfusion Status OK TO TRANSFUSE     Crossmatch Result Compatible    ABO/RH      Component Value Range   ABO/RH(D) A POS    CBC      Component Value Range   WBC 10.4  4.0 - 10.5 (K/uL)   RBC 2.88 (*) 3.87 - 5.11 (MIL/uL)   Hemoglobin 9.5 (*) 12.0 - 15.0 (g/dL)   HCT 28.4 (*) 13.2 - 46.0 (%)   MCV 98.6  78.0 - 100.0 (fL)   MCH 33.0  26.0 - 34.0 (pg)   MCHC 33.5  30.0 - 36.0 (g/dL)   RDW 44.0  10.2 - 72.5 (%)   Platelets 132 (*) 150 - 400 (K/uL)  BASIC METABOLIC PANEL      Component Value Range  Sodium 133 (*) 135 - 145 (mEq/L)   Potassium 4.2  3.5 - 5.1 (mEq/L)   Chloride 102  96 - 112 (mEq/L)   CO2 25  19 - 32 (mEq/L)   Glucose, Bld 136 (*) 70 - 99 (mg/dL)   BUN 14  6 - 23 (mg/dL)   Creatinine, Ser 4.09  0.50 - 1.10 (mg/dL)   Calcium 9.4  8.4 - 81.1 (mg/dL)   GFR calc non Af Amer 70 (*) >90 (mL/min)   GFR calc Af Amer 81 (*) >90 (mL/min)  CBC      Component Value Range   WBC 11.0 (*) 4.0 - 10.5 (K/uL)   RBC 2.49 (*) 3.87 - 5.11 (MIL/uL)   Hemoglobin 8.2 (*) 12.0 - 15.0 (g/dL)   HCT 91.4 (*) 78.2 - 46.0 (%)   MCV 96.4  78.0 - 100.0 (fL)   MCH 32.9  26.0 - 34.0 (pg)   MCHC 34.2  30.0 - 36.0 (g/dL)   RDW 95.6  21.3 - 08.6 (%)   Platelets 113 (*) 150 - 400 (K/uL)  BASIC METABOLIC PANEL      Component Value Range   Sodium 130 (*) 135 - 145 (mEq/L)   Potassium 4.0  3.5 - 5.1 (mEq/L)   Chloride 97  96 - 112 (mEq/L)   CO2 27  19 - 32  (mEq/L)   Glucose, Bld 132 (*) 70 - 99 (mg/dL)   BUN 8  6 - 23 (mg/dL)   Creatinine, Ser 5.78  0.50 - 1.10 (mg/dL)   Calcium 9.4  8.4 - 46.9 (mg/dL)   GFR calc non Af Amer 86 (*) >90 (mL/min)   GFR calc Af Amer >90  >90 (mL/min)  PREPARE RBC (CROSSMATCH)      Component Value Range   Order Confirmation ORDER PROCESSED BY BLOOD BANK    CBC      Component Value Range   WBC 9.3  4.0 - 10.5 (K/uL)   RBC 3.28 (*) 3.87 - 5.11 (MIL/uL)   Hemoglobin 10.5 (*) 12.0 - 15.0 (g/dL)   HCT 62.9 (*) 52.8 - 46.0 (%)   MCV 92.1  78.0 - 100.0 (fL)   MCH 32.0  26.0 - 34.0 (pg)   MCHC 34.8  30.0 - 36.0 (g/dL)   RDW 41.3  24.4 - 01.0 (%)   Platelets 121 (*) 150 - 400 (K/uL)  BASIC METABOLIC PANEL      Component Value Range   Sodium 134 (*) 135 - 145 (mEq/L)   Potassium 3.6  3.5 - 5.1 (mEq/L)   Chloride 99  96 - 112 (mEq/L)   CO2 29  19 - 32 (mEq/L)   Glucose, Bld 95  70 - 99 (mg/dL)   BUN 12  6 - 23 (mg/dL)   Creatinine, Ser 2.72  0.50 - 1.10 (mg/dL)   Calcium 9.5  8.4 - 53.6 (mg/dL)   GFR calc non Af Amer 84 (*) >90 (mL/min)   GFR calc Af Amer >90  >90 (mL/min)  CBC      Component Value Range   WBC 9.6  4.0 - 10.5 (K/uL)   RBC 3.64 (*) 3.87 - 5.11 (MIL/uL)   Hemoglobin 11.5 (*) 12.0 - 15.0 (g/dL)   HCT 64.4 (*) 03.4 - 46.0 (%)   MCV 93.4  78.0 - 100.0 (fL)   MCH 31.6  26.0 - 34.0 (pg)   MCHC 33.8  30.0 - 36.0 (g/dL)   RDW 74.2  59.5 - 63.8 (%)   Platelets 129 (*)  150 - 400 (K/uL)    Office Visit on 06/27/2011  Component Date Value Range Status  . aPTT (seconds) 06/27/2011 37  24-37 Final   Comment:                                 IF BASELINE aPTT IS ELEVATED,                          SUGGEST PATIENT RISK ASSESSMENT                          BE USED TO DETERMINE APPROPRIATE                          ANTICOAGULANT THERAPY.  . WBC (K/uL) 06/27/2011 7.6  4.0-10.5 Final  . RBC (MIL/uL) 06/27/2011 3.64* 3.87-5.11 Final  . Hemoglobin (g/dL) 16/05/9603 54.0* 98.1-19.1 Final  . HCT (%)  06/27/2011 35.3* 36.0-46.0 Final  . MCV (fL) 06/27/2011 97.0  78.0-100.0 Final  . MCH (pg) 06/27/2011 32.1  26.0-34.0 Final  . MCHC (g/dL) 47/82/9562 13.0  86.5-78.4 Final  . RDW (%) 06/27/2011 12.3  11.5-15.5 Final  . Platelets (K/uL) 06/27/2011 188  150-400 Final  . Sodium (mEq/L) 06/27/2011 135  135-145 Final  . Potassium (mEq/L) 06/27/2011 5.0  3.5-5.1 Final  . Chloride (mEq/L) 06/27/2011 103  96-112 Final  . CO2 (mEq/L) 06/27/2011 22  19-32 Final  . Glucose, Bld (mg/dL) 69/62/9528 93  41-32 Final  . BUN (mg/dL) 44/08/270 44* 5-36 Final  . Creatinine, Ser (mg/dL) 64/40/3474 2.59* 5.63-8.75 Final  . Calcium (mg/dL) 64/33/2951 88.4* 1.6-60.6 Final  . Total Protein (g/dL) 30/16/0109 7.2  3.2-3.5 Final  . Albumin (g/dL) 57/32/2025 3.9  4.2-7.0 Final  . AST (U/L) 06/27/2011 20  0-37 Final  . ALT (U/L) 06/27/2011 18  0-35 Final  . Alkaline Phosphatase (U/L) 06/27/2011 66  39-117 Final  . Total Bilirubin (mg/dL) 62/37/6283 0.4  1.5-1.7 Final  . GFR calc non Af Amer (mL/min) 06/27/2011 40* >90 Final  . GFR calc Af Amer (mL/min) 06/27/2011 46* >90 Final   Comment:                                 The eGFR has been calculated                          using the CKD EPI equation.                          This calculation has not been                          validated in all clinical                          situations.                          eGFR's persistently                          <90 mL/min signify  possible Chronic Kidney Disease.  Marland Kitchen Prothrombin Time (seconds) 06/27/2011 13.1  11.6-15.2 Final  . INR  06/27/2011 0.97  0.00-1.49 Final  . Color, Urine  06/27/2011 YELLOW  YELLOW Final  . APPearance  06/27/2011 CLEAR  CLEAR Final  . Specific Gravity, Urine  06/27/2011 1.010  1.005-1.030 Final  . pH  06/27/2011 6.5  5.0-8.0 Final  . Glucose, UA (mg/dL) 40/98/1191 NEGATIVE  NEGATIVE Final  . Hgb urine dipstick  06/27/2011 NEGATIVE  NEGATIVE Final  . Bilirubin  Urine  06/27/2011 NEGATIVE  NEGATIVE Final  . Ketones, ur (mg/dL) 47/82/9562 NEGATIVE  NEGATIVE Final  . Protein, ur (mg/dL) 13/03/6577 NEGATIVE  NEGATIVE Final  . Urobilinogen, UA (mg/dL) 46/96/2952 0.2  8.4-1.3 Final  . Nitrite  06/27/2011 NEGATIVE  NEGATIVE Final  . Leukocytes, UA  06/27/2011 NEGATIVE  NEGATIVE Final   MICROSCOPIC NOT DONE ON URINES WITH NEGATIVE PROTEIN, BLOOD, LEUKOCYTES, NITRITE, OR GLUCOSE <1000 mg/dL.  Marland Kitchen MRSA, PCR  06/27/2011 NEGATIVE  NEGATIVE Final  . Staphylococcus aureus  06/27/2011 NEGATIVE  NEGATIVE Final   Comment:                                 The Xpert SA Assay (FDA                          approved for NASAL specimens                          only), is one component of                          a comprehensive surveillance                          program.  It is not intended                          to diagnose infection nor to                          guide or monitor treatment.    X-Rays:Dg Chest 2 View  06/26/2011  *RADIOLOGY REPORT*  Clinical Data: Preop radiograph.  History of asthma.  CHEST - 2 VIEW  Comparison: 11/22/2007  Findings: Heart size appears mildly enlarged.  There is no pleural effusion or pulmonary venous congestion.  Lung volumes are low.  There is no airspace consolidation.  Review of the visualized osseous structures demonstrates hardware within the lumbar spine compatible with prior posterior fusion.  IMPRESSION:  1.  Mild cardiac enlargement.  Original Report Authenticated By: Rosealee Albee, M.D.    EKG:No orders found for this or any previous visit.   Hospital Course: Patient was admitted to [facility] and taken to the OR and underwent the above state procedure without complications.  Patient tolerated the procedure well and was later transferred to the recovery room and then to the orthopaedic floor for postoperative care.  They were given PO and IV analgesics for pain control following their surgery.  They were  given 24 hours of postoperative antibiotics and started on DVT prophylaxis.   PT and OT were ordered for total joint protocol.  Discharge planning consulted to help with postop disposition and equipment needs.  Patient had a tough night on the evening of surgery and started to get up with therapy on day one.  PCA was discontinued and they were weaned over to PO meds.  Hemovac drain was pulled without difficulty.  Her home meds were restarted and only ordered trade names.  She worked with therapy and continued to progress into day two.  Dressing was changed on day two and the incision was healing well. Her hgb was noted to have dropped and it was felt that she would benefit from blood transfusion.  She received blood and by day three, the patient felt much better and progressed with further with therapy. She needed one more day.  Patient was seen in rounds on day four and met goals and was ready to go home.  Discharge Medications: Prior to Admission medications   Medication Sig Start Date End Date Taking? Authorizing Provider  benzonatate (TESSALON) 100 MG capsule TAKE ONE CAPSULE BY MOUTH EVERY 4 HOURS AS NEEDED 06/26/11  Yes Duncan Dull, MD  budesonide-formoterol [facility]) 80-4.5 MCG/ACT inhaler Inhale 2 puffs into the lungs 2 (two) times daily.     Yes Historical Provider, MD  desloratadine (CLARINEX) 5 MG tablet Take 5 mg by mouth every morning.     Yes Historical Provider, MD  diazepam (VALIUM) 5 MG tablet Take 5 mg by mouth 2 (two) times daily as needed. Muscle spasm   06/20/11  Yes Duncan Dull, MD  doxepin (SINEQUAN) 25 MG capsule Take 50 mg by mouth at bedtime.    Yes Historical Provider, MD  escitalopram (LEXAPRO) 10 MG tablet Take 10 mg by mouth every morning.    Yes Historical Provider, MD  furosemide (LASIX) 20 MG tablet Take 20-40 mg by mouth daily. Fluid    Yes Historical Provider, MD  hydrALAZINE (APRESOLINE) 50 MG tablet Take 50 mg by mouth 2 (two) times daily.     Yes Historical  Provider, MD  isosorbide mononitrate (IMDUR) 60 MG 24 hr tablet   06/04/11  Yes Duncan Dull, MD  levothyroxine (SYNTHROID, LEVOTHROID) 137 MCG tablet Take 137 mcg by mouth every morning.    Yes Historical Provider, MD  lidocaine (LIDODERM) 5 % Place 1 patch onto the skin daily. Remove & Discard patch within 12 hours or as directed by MD    Yes Historical Provider, MD  losartan (COZAAR) 100 MG tablet Take 100 mg by mouth every morning.   05/10/11  Yes Duncan Dull, MD  metoprolol (LOPRESSOR) 100 MG tablet Take 100 mg by mouth 2 (two) times daily.     Yes Historical Provider, MD  montelukast (SINGULAIR) 10 MG tablet Take 10 mg by mouth at bedtime.     Yes Historical Provider, MD  NORVASC 10 MG tablet TAKE 1 TABLET BY MOUTH DAILY 06/26/11  Yes Duncan Dull, MD  oxyCODONE-acetaminophen (PERCOCET) 10-325 MG per tablet Take 1 tablet by mouth every 6 (six) hours as needed. 05/26/11  Yes Duncan Dull, MD  potassium chloride (KLOR-CON 10) 10 MEQ CR tablet Take 10-20 mEq by mouth. Pts take every morning but if she take lasix 40mg  then will take extra dose of potassium   Yes Historical Provider, MD  rosuvastatin (CRESTOR) 20 MG tablet Take 20 mg by mouth at bedtime.   04/19/11  Yes Duncan Dull, MD  sodium chloride (OCEAN) 0.65 % nasal spray Place 2 sprays into the nose as needed. Allergies     Yes Historical Provider, MD  spironolactone (ALDACTONE) 50 MG tablet TAKE 1 TABLET BY  MOUTH EVERY DAY IN THE MORNING 06/26/11  Yes Duncan Dull, MD  SYNTHROID 150 MCG tablet TAKE 1 TABLET BY MOUTH EVERYDAY 06/29/11  Yes Duncan Dull, MD  triamcinolone (KENALOG) 0.025 % ointment Apply 1 application topically 2 (two) times daily. itch    Yes Historical Provider, MD  zolpidem (AMBIEN) 5 MG tablet Take 1 tablet (5 mg total) by mouth at bedtime as needed for sleep (BRAND NAME ONLY,  PATIENT HAD ALLERGIC REACTION TO GENERIC). 05/26/11 05/25/12 Yes Duncan Dull, MD  losartan (COZAAR) 50 MG tablet Take 1 tablet (50 mg total) by  mouth 2 (two) times daily. 07/04/11 07/03/12  Duncan Dull, MD  methocarbamol (ROBAXIN) 500 MG tablet Take 1 tablet (500 mg total) by mouth every 6 (six) hours as needed. 07/14/11 07/24/11  Gus Rankin Aluisio  rivaroxaban (XARELTO) 10 MG TABS tablet Take 1 tablet (10 mg total) by mouth daily. 07/14/11   Gus Rankin Aluisio  traMADol (ULTRAM) 50 MG tablet Take 1-2 tablets (50-100 mg total) by mouth every 6 (six) hours as needed. Maximum dose= 8 tablets per day 07/14/11 07/24/11  Gus Rankin Aluisio   Diet: heart healthy  Activity:WBAT   Follow-up:in 2 weeks  Disposition: Home  Discharged Condition: good    Discharge Medication List as of 07/14/2011  9:34 AM    START taking these medications   Details  methocarbamol (ROBAXIN) 500 MG tablet Take 1 tablet (500 mg total) by mouth every 6 (six) hours as needed., Starting 07/14/2011, Until Mon 07/24/11, Normal    rivaroxaban (XARELTO) 10 MG TABS tablet Take 1 tablet (10 mg total) by mouth daily., Starting 07/14/2011, Until Discontinued, Normal    traMADol (ULTRAM) 50 MG tablet Take 1-2 tablets (50-100 mg total) by mouth every 6 (six) hours as needed. Maximum dose= 8 tablets per day, Starting 07/14/2011, Until Mon 07/24/11, Normal      CONTINUE these medications which have NOT CHANGED   Details  benzonatate (TESSALON) 100 MG capsule TAKE ONE CAPSULE BY MOUTH EVERY 4 HOURS AS NEEDED, Normal    budesonide-formoterol (SYMBICORT) 80-4.5 MCG/ACT inhaler Inhale 2 puffs into the lungs 2 (two) times daily.  , Until Discontinued, Historical Med    desloratadine (CLARINEX) 5 MG tablet Take 5 mg by mouth every morning.  , Until Discontinued, Historical Med    diazepam (VALIUM) 5 MG tablet Take 5 mg by mouth 2 (two) times daily as needed. Muscle spasm  , Starting 06/20/2011, Until Discontinued, Historical Med    doxepin (SINEQUAN) 25 MG capsule Take 50 mg by mouth at bedtime. , Until Discontinued, Historical Med    escitalopram (LEXAPRO) 10 MG tablet Take 10  mg by mouth every morning. , Until Discontinued, Historical Med    furosemide (LASIX) 20 MG tablet Take 20-40 mg by mouth daily. Fluid , Until Discontinued, Historical Med    hydrALAZINE (APRESOLINE) 50 MG tablet Take 50 mg by mouth 2 (two) times daily.  , Until Discontinued, Historical Med    isosorbide mononitrate (IMDUR) 60 MG 24 hr tablet  , Starting 06/04/2011, Until Discontinued, Historical Med    !! levothyroxine (SYNTHROID, LEVOTHROID) 137 MCG tablet Take 137 mcg by mouth every morning. , Until Discontinued, Historical Med    lidocaine (LIDODERM) 5 % Place 1 patch onto the skin daily. Remove & Discard patch within 12 hours or as directed by MD , Until Discontinued, Historical Med    !! losartan (COZAAR) 100 MG tablet Take 100 mg by mouth every morning.  , Starting 05/10/2011, Until Discontinued,  Historical Med    metoprolol (LOPRESSOR) 100 MG tablet Take 100 mg by mouth 2 (two) times daily.  , Until Discontinued, Historical Med    montelukast (SINGULAIR) 10 MG tablet Take 10 mg by mouth at bedtime.  , Until Discontinued, Historical Med    NORVASC 10 MG tablet TAKE 1 TABLET BY MOUTH DAILY, Normal    oxyCODONE-acetaminophen (PERCOCET) 10-325 MG per tablet Take 1 tablet by mouth every 6 (six) hours as needed., Starting 05/26/2011, Until Discontinued, Print    potassium chloride (KLOR-CON 10) 10 MEQ CR tablet Take 10-20 mEq by mouth. Pts take every morning but if she take lasix 40mg  then will take extra dose of potassium, Until Discontinued, Historical Med    rosuvastatin (CRESTOR) 20 MG tablet Take 20 mg by mouth at bedtime.  , Starting 04/19/2011, Until Discontinued, Historical Med    sodium chloride (OCEAN) 0.65 % nasal spray Place 2 sprays into the nose as needed. Allergies  , Until Discontinued, Historical Med    spironolactone (ALDACTONE) 50 MG tablet TAKE 1 TABLET BY MOUTH EVERY DAY IN THE MORNING, Normal    !! SYNTHROID 150 MCG tablet TAKE 1 TABLET BY MOUTH EVERYDAY,  Normal    triamcinolone (KENALOG) 0.025 % ointment Apply 1 application topically 2 (two) times daily. itch , Until Discontinued, Historical Med    zolpidem (AMBIEN) 5 MG tablet Take 1 tablet (5 mg total) by mouth at bedtime as needed for sleep (BRAND NAME ONLY,  PATIENT HAD ALLERGIC REACTION TO GENERIC)., Starting 05/26/2011, Until Sat 05/25/12, Print    !! losartan (COZAAR) 50 MG tablet Take 1 tablet (50 mg total) by mouth 2 (two) times daily., Starting 07/04/2011, Until Wed 07/03/12, Normal     !! - Potential duplicate medications found. Please discuss with provider.    STOP taking these medications     calcium citrate-vitamin D (CITRACAL+D) 315-200 MG-UNIT per tablet      LOVAZA 1 G capsule      Lutein 6 MG CAPS      Prenatal Vit-Fe Fumarate-FA (VITAFOL-PN) TABS      vitamin C (ASCORBIC ACID) 500 MG tablet        Follow-up Information    Follow up with ALUISIO,FRANK V. Make an appointment on 07/25/2011. (Call Monday to make this appointment)    Contact information:   [facility] [address] [place] [address] [phone]          Signed: Patrica Duel 07/20/2011, 9:48 PM

## 2011-07-26 ENCOUNTER — Telehealth: Payer: Self-pay | Admitting: Internal Medicine

## 2011-07-26 DIAGNOSIS — I10 Essential (primary) hypertension: Secondary | ICD-10-CM

## 2011-07-26 MED ORDER — NEBIVOLOL HCL 5 MG PO TABS: 5.0000 mg | ORAL_TABLET | Freq: Every day | ORAL | Status: DC

## 2011-07-26 NOTE — Telephone Encounter (Signed)
Stop making lopressor 50 mg she takes them 2 times daily .She is needing a substitute for this medication.

## 2011-07-26 NOTE — Telephone Encounter (Signed)
Will substitute   bystolic 5 mg one tablet daily  For lopressor.   If she would like to come by for samples to ry, before she picks up the rx, #14 days aside for her of the 5 mg dose

## 2011-07-28 MED ORDER — NEBIVOLOL HCL 5 MG PO TABS: 5.0000 mg | ORAL_TABLET | Freq: Every day | ORAL | Status: DC

## 2011-07-28 NOTE — Telephone Encounter (Signed)
She wants an Rx called in.  Rx has been called in.

## 2011-07-31 ENCOUNTER — Other Ambulatory Visit: Payer: Self-pay | Admitting: Internal Medicine

## 2011-08-02 MED ORDER — DESLORATADINE 5 MG PO TABS: 5.0000 mg | ORAL_TABLET | ORAL | Status: DC

## 2011-08-10 ENCOUNTER — Telehealth: Payer: Self-pay | Admitting: Internal Medicine

## 2011-08-10 NOTE — Telephone Encounter (Signed)
Patient notified. She will keep her appt for 08-22-10. She is only taking bystolic and not taking lopressor.

## 2011-08-10 NOTE — Telephone Encounter (Signed)
Spoke with patient, she says that she feels fine, just concerned that it is running low. She just wanted you to be aware that it has been running around 116/60 to 103/46. Has an appt on 08/23/11, but is asking if she should do anything different with her meds before she is seen. She has been taking the bystolic 5mg  daily.

## 2011-08-10 NOTE — Telephone Encounter (Signed)
[phone] Pt called her bp is running low  103/46  She has changed her med to bystlic 5 mg 1 day used to take lopresser  100mg  2 x day  Pt reduce to 50 mg 2 x day  Pt has appointment 08/23/11   Wants earlier appoinment

## 2011-08-10 NOTE — Telephone Encounter (Signed)
That is a normal blood pressure and does not necesstiate an urgent visit.  Clarify though that she should not be taking bystolic and lopressor bc both are beta blockers./

## 2011-08-16 ENCOUNTER — Encounter: Payer: Self-pay | Admitting: Orthopedic Surgery

## 2011-08-17 ENCOUNTER — Other Ambulatory Visit: Payer: Self-pay | Admitting: *Deleted

## 2011-08-17 MED ORDER — ESCITALOPRAM OXALATE 10 MG PO TABS: 10.0000 mg | ORAL_TABLET | ORAL | Status: DC

## 2011-08-22 ENCOUNTER — Encounter: Payer: Self-pay | Admitting: Orthopedic Surgery

## 2011-08-22 HISTORY — PX: JOINT REPLACEMENT: SHX530

## 2011-08-23 ENCOUNTER — Ambulatory Visit (INDEPENDENT_AMBULATORY_CARE_PROVIDER_SITE_OTHER): Payer: Medicare Other | Admitting: Internal Medicine

## 2011-08-23 ENCOUNTER — Encounter: Payer: Self-pay | Admitting: Internal Medicine

## 2011-08-23 VITALS — BP 120/52 | HR 60 | Temp 98.2°F | Wt 217.0 lb

## 2011-08-23 DIAGNOSIS — I10 Essential (primary) hypertension: Secondary | ICD-10-CM

## 2011-08-23 DIAGNOSIS — M1710 Unilateral primary osteoarthritis, unspecified knee: Secondary | ICD-10-CM

## 2011-08-23 DIAGNOSIS — M1711 Unilateral primary osteoarthritis, right knee: Secondary | ICD-10-CM

## 2011-08-23 MED ORDER — OXYCODONE-ACETAMINOPHEN 10-325 MG PO TABS: 1.0000 | ORAL_TABLET | Freq: Four times a day (QID) | ORAL | Status: DC | PRN

## 2011-08-23 NOTE — Progress Notes (Signed)
Subjective:    Patient ID: [name], female    DOB: Dec 23, 1940, 71 y.o.   MRN: [id number]  HPI  71 yo white female with history of hypertension, asthma/COPD, recurrent inner ear infections with multiples surgeries, DJD right knee,presents for regular followup on chronic meidcal issues.  She is recovering form TKR on right done on Nov 19th in [place] by Carma Lair.  She  received 2 units of PRBCs on Nov 21 for  drop in hgb to 8 (preoperative hgb was 14).  She has a history of multiple prior transfusions with no history of adverse reactions .  Her surgery has yileded excellent results.  She has surpassed the expectations of her surgeon with regard to strength and ROM and her PT was provided by [facility], followed by [facility].  She was treated with azithromycin 500 mg daily x 2 weeks one month ago for URI with myalgias, sonus congestion and fevers.  She recovered but antibiotics were resumed last week by her ENT Dr. Lenoria Farrier for recurrence of pueuelent left ear drainage with pain.  Her symptoms have improved and she follows up with ENT on Dec 10 .   Occasional constipation treated with fiber one bars.  Past Medical History  Diagnosis Date  . Degenerative disc disease     HERNITAED DISC BY MRI ON 07/07;S/P LAMINECTOMY,RID REPLACEMENT  . Brain aneurysm     S/P RESECTION  . Discoid lupus erythematosus eyelid     NEGATIVE SEROLOGIC MARKERS  . Hypercholesterolemia   . Chronic sinusitis   . Hypertension   . Complication of anesthesia 05/30/2010    from colonoscopy-had Propofol-had a  . Complication of anesthesia     reaction-went into asthma-pneumonia,-  . Complication of anesthesia     then CHF and to ICU  . Dysrhythmia     PVC's occ.  . Pneumonia 05/30/2010    after receiving Propofol  . Headache     migraines occ.  . CHF (congestive heart failure) 05/2010    after receiving Propofol  . Hypothyroidism     takes Synthroid  . Blood transfusion 2009    back surgery  . Sleep apnea    uses CPAP-8.4  . Asthma 06/2010    depending on what around   b    Review of Systems  Constitutional: Negative for fever, chills and unexpected weight change.  HENT: Negative for hearing loss, ear pain, nosebleeds, congestion, sore throat, facial swelling, rhinorrhea, sneezing, mouth sores, trouble swallowing, neck pain, neck stiffness, voice change, postnasal drip, sinus pressure, tinnitus and ear discharge.   Eyes: Negative for pain, discharge, redness and visual disturbance.  Respiratory: Negative for cough, chest tightness, shortness of breath, wheezing and stridor.   Cardiovascular: Negative for chest pain, palpitations and leg swelling.  Musculoskeletal: Positive for back pain. Negative for myalgias and arthralgias.  Skin: Negative for color change and rash.  Neurological: Negative for dizziness, weakness, light-headedness and headaches.  Hematological: Negative for adenopathy.    BP 120/52  Pulse 60  Temp(Src) 98.2 F (36.8 C) (Oral)  Wt 217 lb (98.431 kg)     Objective:   Physical Exam  Constitutional: She is oriented to person, place, and time. She appears well-developed and well-nourished.  HENT:  Mouth/Throat: Oropharynx is clear and moist.  Eyes: EOM are normal. Pupils are equal, round, and reactive to light. No scleral icterus.  Neck: Normal range of motion. Neck supple. No JVD present. No thyromegaly present.  Cardiovascular: Normal rate, regular rhythm, normal  heart sounds and intact distal pulses.   Pulmonary/Chest: Effort normal and breath sounds normal.  Abdominal: Soft. Bowel sounds are normal. She exhibits no mass. There is no tenderness.  Musculoskeletal: Normal range of motion. She exhibits no edema.  Lymphadenopathy:    She has no cervical adenopathy.  Neurological: She is alert and oriented to person, place, and time.  Skin: Skin is warm and dry.  Psychiatric: She has a normal mood and affect.          Assessment & Plan:   Osteoarthritis of  right knee She is s/p TKR of right knee by Carma Lair Nov 19th with excellent results  Hypertension Well controlled on current regimen. Renal function stable, no changes today.     Updated Medication List Outpatient Encounter Prescriptions as of 08/23/2011  Medication Sig Dispense Refill  . benzonatate (TESSALON) 100 MG capsule TAKE ONE CAPSULE BY MOUTH EVERY 4 HOURS AS NEEDED  120 capsule  2  . budesonide-formoterol (SYMBICORT) 80-4.5 MCG/ACT inhaler Inhale 2 puffs into the lungs 2 (two) times daily.        Marland Kitchen desloratadine (CLARINEX) 5 MG tablet Take 1 tablet (5 mg total) by mouth every morning.  90 tablet  3  . diazepam (VALIUM) 5 MG tablet Take 5 mg by mouth 2 (two) times daily as needed. Muscle spasm        . doxepin (SINEQUAN) 25 MG capsule Take 50 mg by mouth at bedtime.       Marland Kitchen escitalopram (LEXAPRO) 10 MG tablet Take 1 tablet (10 mg total) by mouth every morning.  90 tablet  0  . furosemide (LASIX) 20 MG tablet Take 20-40 mg by mouth daily. Fluid       . hydrALAZINE (APRESOLINE) 50 MG tablet Take 50 mg by mouth 2 (two) times daily.        . isosorbide mononitrate (IMDUR) 60 MG 24 hr tablet        . levothyroxine (SYNTHROID, LEVOTHROID) 137 MCG tablet Take 137 mcg by mouth every morning.       . lidocaine (LIDODERM) 5 % Place 1 patch onto the skin daily. Remove & Discard patch within 12 hours or as directed by MD       . losartan (COZAAR) 100 MG tablet Take 100 mg by mouth every morning.        Marland Kitchen losartan (COZAAR) 50 MG tablet Take 1 tablet (50 mg total) by mouth 2 (two) times daily.  60 tablet  2  . montelukast (SINGULAIR) 10 MG tablet Take 10 mg by mouth at bedtime.        . nebivolol (BYSTOLIC) 5 MG tablet Take 1 tablet (5 mg total) by mouth daily.  30 tablet  3  . nebivolol (BYSTOLIC) 5 MG tablet Take 1 tablet (5 mg total) by mouth daily.  30 tablet  3  . NORVASC 10 MG tablet TAKE 1 TABLET BY MOUTH DAILY  30 tablet  2  . oxyCODONE-acetaminophen (PERCOCET) 10-325 MG per  tablet Take 1 tablet by mouth every 6 (six) hours as needed.  120 tablet  none  . oxyCODONE-acetaminophen (PERCOCET) 10-325 MG per tablet Take 1 tablet by mouth every 6 (six) hours as needed.  120 tablet  none  . potassium chloride (KLOR-CON 10) 10 MEQ CR tablet Take 10-20 mEq by mouth. Pts take every morning but if she take lasix 40mg  then will take extra dose of potassium      . rivaroxaban (XARELTO) 10 MG TABS  tablet Take 1 tablet (10 mg total) by mouth daily.  16 tablet  0  . rosuvastatin (CRESTOR) 20 MG tablet Take 20 mg by mouth at bedtime.        . sodium chloride (OCEAN) 0.65 % nasal spray Place 2 sprays into the nose as needed. Allergies        . spironolactone (ALDACTONE) 50 MG tablet TAKE 1 TABLET BY MOUTH EVERY DAY IN THE MORNING  30 tablet  3  . SYNTHROID 150 MCG tablet TAKE 1 TABLET BY MOUTH EVERYDAY  30 tablet  4  . triamcinolone (KENALOG) 0.025 % ointment Apply 1 application topically 2 (two) times daily. itch       . zolpidem (AMBIEN) 5 MG tablet Take 1 tablet (5 mg total) by mouth at bedtime as needed for sleep (BRAND NAME ONLY,  PATIENT HAD ALLERGIC REACTION TO GENERIC).  30 tablet  5  . DISCONTD: oxyCODONE-acetaminophen (PERCOCET) 10-325 MG per tablet Take 1 tablet by mouth every 6 (six) hours as needed.  120 tablet  none  . metoprolol (LOPRESSOR) 100 MG tablet Take 100 mg by mouth 2 (two) times daily.         Facility-Administered Encounter Medications as of 08/23/2011  Medication Dose Route Frequency Provider Last Rate Last Dose  . chlorhexidine (HIBICLENS) 4 % liquid 4 application  60 mL Topical Once Homero Fellers V Aluisio      . chlorhexidine (HIBICLENS) 4 % liquid 4 application  60 mL Topical Once Gus Rankin Aluisio

## 2011-08-23 NOTE — Assessment & Plan Note (Signed)
She is s/p TKR of right knee by Carma Lair Nov 19th with excellent results

## 2011-08-23 NOTE — Assessment & Plan Note (Signed)
Well controlled on current regimen. Renal function stable, no changes today. 

## 2011-09-21 ENCOUNTER — Other Ambulatory Visit: Payer: Self-pay | Admitting: *Deleted

## 2011-09-21 MED ORDER — DIAZEPAM 5 MG PO TABS: 5.0000 mg | ORAL_TABLET | Freq: Two times a day (BID) | ORAL | Status: DC | PRN

## 2011-09-21 NOTE — Telephone Encounter (Signed)
Faxed request from [facility].  No last date filled given.

## 2011-09-22 ENCOUNTER — Encounter: Payer: Self-pay | Admitting: Orthopedic Surgery

## 2011-09-22 NOTE — Telephone Encounter (Signed)
Called to pharmacy 

## 2011-09-29 ENCOUNTER — Ambulatory Visit: Payer: Self-pay | Admitting: Specialist

## 2011-10-02 ENCOUNTER — Other Ambulatory Visit: Payer: Self-pay | Admitting: *Deleted

## 2011-10-02 MED ORDER — ISOSORBIDE MONONITRATE ER 60 MG PO TB24: ORAL_TABLET | ORAL | Status: DC

## 2011-10-02 MED ORDER — LOSARTAN POTASSIUM 50 MG PO TABS: 50.0000 mg | ORAL_TABLET | Freq: Two times a day (BID) | ORAL | Status: DC

## 2011-10-03 ENCOUNTER — Other Ambulatory Visit: Payer: Self-pay | Admitting: *Deleted

## 2011-10-03 MED ORDER — LIDOCAINE 5 % EX PTCH: 1.0000 | MEDICATED_PATCH | CUTANEOUS | Status: AC

## 2011-10-08 DIAGNOSIS — Z96659 Presence of unspecified artificial knee joint: Secondary | ICD-10-CM | POA: Insufficient documentation

## 2011-10-10 ENCOUNTER — Other Ambulatory Visit: Payer: Self-pay | Admitting: *Deleted

## 2011-10-10 MED ORDER — AMLODIPINE BESYLATE 10 MG PO TABS: 10.0000 mg | ORAL_TABLET | Freq: Every day | ORAL | Status: DC

## 2011-10-16 ENCOUNTER — Other Ambulatory Visit: Payer: Self-pay | Admitting: Internal Medicine

## 2011-10-16 MED ORDER — SPIRONOLACTONE 50 MG PO TABS: 50.0000 mg | ORAL_TABLET | Freq: Every day | ORAL | Status: DC

## 2011-10-18 ENCOUNTER — Ambulatory Visit (INDEPENDENT_AMBULATORY_CARE_PROVIDER_SITE_OTHER): Payer: Medicare Other | Admitting: Internal Medicine

## 2011-10-18 ENCOUNTER — Encounter: Payer: Self-pay | Admitting: Internal Medicine

## 2011-10-18 VITALS — BP 114/50 | HR 74 | Temp 98.3°F | Resp 16 | Wt 205.5 lb

## 2011-10-18 DIAGNOSIS — M1711 Unilateral primary osteoarthritis, right knee: Secondary | ICD-10-CM

## 2011-10-18 DIAGNOSIS — I10 Essential (primary) hypertension: Secondary | ICD-10-CM

## 2011-10-18 DIAGNOSIS — M1710 Unilateral primary osteoarthritis, unspecified knee: Secondary | ICD-10-CM

## 2011-10-18 DIAGNOSIS — R197 Diarrhea, unspecified: Secondary | ICD-10-CM

## 2011-10-18 MED ORDER — PROMETHAZINE HCL 12.5 MG PO TABS: 12.5000 mg | ORAL_TABLET | Freq: Three times a day (TID) | ORAL | Status: DC | PRN

## 2011-10-18 MED ORDER — OXYCODONE-ACETAMINOPHEN 10-325 MG PO TABS: 1.0000 | ORAL_TABLET | Freq: Four times a day (QID) | ORAL | Status: DC | PRN

## 2011-10-18 MED ORDER — FLUCONAZOLE 150 MG PO TABS: 150.0000 mg | ORAL_TABLET | Freq: Every day | ORAL | Status: AC

## 2011-10-18 NOTE — Progress Notes (Signed)
Subjective:    Patient ID: [name], female    DOB: 04/19/1941, 71 y.o.   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  Had  PICC Line placed for empiric Ceftriaxone before the isolate /culture was obtained .  Having diarrhea.   Past Medical History  Diagnosis Date  . Degenerative disc disease     HERNITAED DISC BY MRI ON 07/07;S/P LAMINECTOMY,RID REPLACEMENT  . Brain aneurysm     S/P RESECTION  . Discoid lupus erythematosus eyelid     NEGATIVE SEROLOGIC MARKERS  . Hypercholesterolemia   . Chronic sinusitis   . Hypertension   . Complication of anesthesia 05/30/2010    from colonoscopy-had Propofol-had a  . Complication of anesthesia     reaction-went into asthma-pneumonia,-  . Complication of anesthesia     then CHF and to ICU  . Dysrhythmia     PVC's occ.  . Pneumonia 05/30/2010    after receiving Propofol  . Headache     migraines occ.  . CHF (congestive heart failure) 05/2010    after receiving Propofol  . Hypothyroidism     takes Synthroid  . Blood transfusion 2009    back surgery  . Sleep apnea     uses CPAP-8.4  . Asthma 06/2010    depending on what around   Current Outpatient Prescriptions on File Prior to Visit  Medication Sig Dispense Refill  . amLODipine (NORVASC) 10 MG tablet Take 1 tablet (10 mg total) by mouth daily.  30 tablet  0  . benzonatate (TESSALON) 100 MG capsule TAKE ONE CAPSULE BY MOUTH EVERY 4 HOURS AS NEEDED  120 capsule  2  . budesonide-formoterol (SYMBICORT) 80-4.5 MCG/ACT inhaler Inhale 2 puffs into the lungs 2 (two) times daily.        Marland Kitchen desloratadine (CLARINEX) 5 MG tablet Take 1 tablet (5 mg total) by mouth every morning.  90 tablet  3  . diazepam (VALIUM) 5 MG tablet Take 1 tablet (5 mg total) by mouth 2 (two) times daily as needed. Muscle spasm  60 tablet  3  . doxepin (SINEQUAN) 25 MG capsule Take 50 mg by mouth at bedtime.        Marland Kitchen escitalopram (LEXAPRO) 10 MG tablet Take 1 tablet (10 mg total) by mouth every morning.  90 tablet  0  . furosemide (LASIX) 20 MG tablet Take 20-40 mg by mouth daily. Fluid       . hydrALAZINE (APRESOLINE) 50 MG tablet Take 50 mg by mouth 2 (two) times daily.        . isosorbide mononitrate (IMDUR) 60 MG 24 hr tablet Take one by mouth daily  30 tablet  6  . lidocaine (LIDODERM) 5 % Place 1 patch onto the skin daily. Remove & Discard patch within 12 hours or as directed by MD       . lidocaine (LIDODERM) 5 % Place 1 patch onto the skin daily. Remove & Discard patch within 12 hours or as directed by MD  90 patch  2  . losartan (COZAAR) 50 MG tablet Take 1 tablet (50 mg total) by mouth 2 (two) times daily.  60 tablet  6  . montelukast (SINGULAIR) 10 MG tablet Take 10 mg by mouth at bedtime.        . nebivolol (BYSTOLIC) 5 MG tablet Take 1 tablet (5 mg total) by mouth daily.  30 tablet  3  . potassium chloride (KLOR-CON 10) 10 MEQ CR tablet Take 10-20 mEq by mouth. Pts take every morning but if she take lasix 40mg  then will take extra dose of potassium      . rivaroxaban (XARELTO) 10 MG TABS tablet Take 1 tablet (10 mg total) by mouth daily.  16 tablet  0  . rosuvastatin (CRESTOR) 20 MG tablet Take 20 mg by mouth at bedtime.        . sodium chloride (OCEAN) 0.65 % nasal spray Place 2 sprays into the nose as needed. Allergies        . SYNTHROID 150 MCG tablet TAKE 1 TABLET BY MOUTH EVERYDAY  30 tablet  4  . triamcinolone (KENALOG) 0.025 % ointment Apply 1 application topically 2 (two) times daily. itch       . zolpidem (AMBIEN) 5 MG tablet Take 1 tablet (5 mg total) by mouth at bedtime as needed for sleep (BRAND NAME ONLY,  PATIENT HAD ALLERGIC REACTION TO GENERIC).  30 tablet  5   Current Facility-Administered Medications on File Prior to Visit  Medication Dose Route Frequency Provider Last Rate Last Dose  . chlorhexidine (HIBICLENS) 4 % liquid 4 application  60 mL Topical Once Loanne Drilling, MD      . chlorhexidine (HIBICLENS) 4 % liquid 4 application  60 mL Topical Once Loanne Drilling, MD       Review of Systems  Constitutional: Negative for fever, chills and unexpected weight change.  HENT: Negative for hearing loss, ear pain, nosebleeds, congestion, sore throat, facial swelling, rhinorrhea, sneezing, mouth sores, trouble swallowing, neck pain, neck stiffness, voice change, postnasal drip, sinus pressure, tinnitus and ear discharge.   Eyes: Negative for pain, discharge, redness and visual disturbance.  Respiratory: Negative for cough, chest tightness, shortness of breath, wheezing and stridor.   Cardiovascular: Negative for chest pain, palpitations and leg swelling.  Musculoskeletal: Negative for myalgias and arthralgias.  Skin: Negative for color change and rash.  Neurological: Negative for dizziness, weakness, light-headedness and headaches.  Hematological: Negative for adenopathy.       Objective:   Physical Exam  Constitutional: She is oriented to person, place, and time. She appears well-developed and well-nourished.  HENT:  Mouth/Throat: Oropharynx is clear and moist.  Eyes: EOM are normal. Pupils are equal, round, and reactive to light. No scleral icterus.  Neck: Normal range of motion. Neck supple. No JVD present. No thyromegaly present.  Cardiovascular: Normal rate, regular rhythm, normal heart sounds and intact distal pulses.   Pulmonary/Chest: Effort normal and breath sounds normal.  Abdominal: Soft. Bowel sounds are normal. She exhibits no mass. There is no tenderness.  Musculoskeletal: Normal range of motion. She exhibits no edema.  Lymphadenopathy:    She has no cervical adenopathy.  Neurological: She is alert and oriented to person, place, and time.  Skin: Skin is warm and dry.  Psychiatric: She has a normal mood and affect.      Assessment & Plan:   Diarrhea Resolved, none in the last 24 hours.    Hypertension Well controlled on current  medications.  No changes today.  Osteoarthritis of right knee She is s/p right knee replacement by Carma Lair with uncomplicated recovery.    Updated Medication List Outpatient Encounter Prescriptions as of 10/18/2011  Medication Sig Dispense Refill  . amLODipine (NORVASC) 10 MG tablet Take 1 tablet (10 mg total) by mouth daily.  30 tablet  0  . benzonatate (TESSALON)  100 MG capsule TAKE ONE CAPSULE BY MOUTH EVERY 4 HOURS AS NEEDED  120 capsule  2  . budesonide-formoterol (SYMBICORT) 80-4.5 MCG/ACT inhaler Inhale 2 puffs into the lungs 2 (two) times daily.        Marland Kitchen desloratadine (CLARINEX) 5 MG tablet Take 1 tablet (5 mg total) by mouth every morning.  90 tablet  3  . diazepam (VALIUM) 5 MG tablet Take 1 tablet (5 mg total) by mouth 2 (two) times daily as needed. Muscle spasm  60 tablet  3  . doxepin (SINEQUAN) 25 MG capsule Take 50 mg by mouth at bedtime.       Marland Kitchen escitalopram (LEXAPRO) 10 MG tablet Take 1 tablet (10 mg total) by mouth every morning.  90 tablet  0  . furosemide (LASIX) 20 MG tablet Take 20-40 mg by mouth daily. Fluid       . hydrALAZINE (APRESOLINE) 50 MG tablet Take 50 mg by mouth 2 (two) times daily.        . isosorbide mononitrate (IMDUR) 60 MG 24 hr tablet Take one by mouth daily  30 tablet  6  . lidocaine (LIDODERM) 5 % Place 1 patch onto the skin daily. Remove & Discard patch within 12 hours or as directed by MD       . lidocaine (LIDODERM) 5 % Place 1 patch onto the skin daily. Remove & Discard patch within 12 hours or as directed by MD  90 patch  2  . losartan (COZAAR) 50 MG tablet Take 1 tablet (50 mg total) by mouth 2 (two) times daily.  60 tablet  6  . montelukast (SINGULAIR) 10 MG tablet Take 10 mg by mouth at bedtime.        . nebivolol (BYSTOLIC) 5 MG tablet Take 1 tablet (5 mg total) by mouth daily.  30 tablet  3  . oxyCODONE-acetaminophen (PERCOCET) 10-325 MG per tablet Take 1 tablet by mouth every 6 (six) hours as needed.  120 tablet  none  . potassium  chloride (KLOR-CON 10) 10 MEQ CR tablet Take 10-20 mEq by mouth. Pts take every morning but if she take lasix 40mg  then will take extra dose of potassium      . promethazine (PHENERGAN) 12.5 MG tablet Take 1 tablet (12.5 mg total) by mouth every 8 (eight) hours as needed for nausea.  30 tablet  3  . rivaroxaban (XARELTO) 10 MG TABS tablet Take 1 tablet (10 mg total) by mouth daily.  16 tablet  0  . rosuvastatin (CRESTOR) 20 MG tablet Take 20 mg by mouth at bedtime.        . sodium chloride (OCEAN) 0.65 % nasal spray Place 2 sprays into the nose as needed. Allergies        . SYNTHROID 150 MCG tablet TAKE 1 TABLET BY MOUTH EVERYDAY  30 tablet  4  . triamcinolone (KENALOG) 0.025 % ointment Apply 1 application topically 2 (two) times daily. itch       . zolpidem (AMBIEN) 5 MG tablet Take 1 tablet (5 mg total) by mouth at bedtime as needed for sleep (BRAND NAME ONLY,  PATIENT HAD ALLERGIC REACTION TO GENERIC).  30 tablet  5  . DISCONTD: oxyCODONE-acetaminophen (PERCOCET) 10-325 MG per tablet Take 1 tablet by mouth every 6 (six) hours as needed.  120 tablet  none  . fluconazole (DIFLUCAN) 150 MG tablet Take 1 tablet (150 mg total) by mouth daily.  2 tablet  1  . DISCONTD: levothyroxine (SYNTHROID, LEVOTHROID)  137 MCG tablet Take 137 mcg by mouth every morning.       Marland Kitchen DISCONTD: losartan (COZAAR) 100 MG tablet Take 100 mg by mouth every morning.        Marland Kitchen DISCONTD: metoprolol (LOPRESSOR) 100 MG tablet Take 100 mg by mouth 2 (two) times daily.        Marland Kitchen DISCONTD: nebivolol (BYSTOLIC) 5 MG tablet Take 1 tablet (5 mg total) by mouth daily.  30 tablet  3  . DISCONTD: oxyCODONE-acetaminophen (PERCOCET) 10-325 MG per tablet Take 1 tablet by mouth every 6 (six) hours as needed.  120 tablet  none  . DISCONTD: spironolactone (ALDACTONE) 50 MG tablet Take 1 tablet (50 mg total) by mouth daily.  30 tablet  6   Facility-Administered Encounter Medications as of 10/18/2011  Medication Dose Route Frequency Provider  Last Rate Last Dose  . chlorhexidine (HIBICLENS) 4 % liquid 4 application  60 mL Topical Once Loanne Drilling, MD      . chlorhexidine (HIBICLENS) 4 % liquid 4 application  60 mL Topical Once Loanne Drilling, MD

## 2011-10-19 ENCOUNTER — Encounter: Payer: Self-pay | Admitting: Internal Medicine

## 2011-10-22 DIAGNOSIS — R197 Diarrhea, unspecified: Secondary | ICD-10-CM | POA: Insufficient documentation

## 2011-10-22 NOTE — Assessment & Plan Note (Signed)
She is s/p right knee replacement by Carma Lair with uncomplicated recovery.

## 2011-10-22 NOTE — Assessment & Plan Note (Signed)
Resolved, none in the last 24 hours.

## 2011-10-22 NOTE — Assessment & Plan Note (Signed)
Well controlled on current medications.  No changes today. 

## 2011-10-31 ENCOUNTER — Telehealth: Payer: Self-pay | Admitting: Internal Medicine

## 2011-10-31 NOTE — Telephone Encounter (Signed)
[phone]   [phone] Pt called to let you know that she is going into the hospital @ [facility] today.  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  She is having port cath put  in .

## 2011-11-08 ENCOUNTER — Telehealth: Payer: Self-pay | Admitting: Internal Medicine

## 2011-11-08 MED ORDER — AMLODIPINE BESYLATE 10 MG PO TABS: 10.0000 mg | ORAL_TABLET | Freq: Every day | ORAL | Status: DC

## 2011-11-08 NOTE — Telephone Encounter (Signed)
Rx has been called in.  Patient notified. 

## 2011-11-08 NOTE — Telephone Encounter (Signed)
[phone] Pt called checking on her rx norvasc 10mg   Pt is out she called this in to her pharmcy last Tuesday  Pt is completely out [place]  [phone]

## 2011-11-22 ENCOUNTER — Telehealth: Payer: Self-pay | Admitting: Internal Medicine

## 2011-11-22 NOTE — Telephone Encounter (Signed)
[phone] Pt called to see if we received records from sleep clinic  Pt stated she signed medical release form  Has dr Darrick Huntsman be getting information from dr Lenoria Farrier @ [facility]  Pt stated she is seeing infection diease dr at [facility] are you getting notes

## 2011-11-27 ENCOUNTER — Other Ambulatory Visit: Payer: Self-pay | Admitting: Internal Medicine

## 2011-11-27 DIAGNOSIS — M1711 Unilateral primary osteoarthritis, right knee: Secondary | ICD-10-CM

## 2011-11-27 MED ORDER — OXYCODONE-ACETAMINOPHEN 10-325 MG PO TABS: 1.0000 | ORAL_TABLET | Freq: Four times a day (QID) | ORAL | Status: DC | PRN

## 2011-11-27 NOTE — Telephone Encounter (Signed)
I notified patient we have been receiving records from her doctors, they are scanned in her chart.

## 2011-11-28 ENCOUNTER — Other Ambulatory Visit: Payer: Self-pay | Admitting: Internal Medicine

## 2011-11-28 MED ORDER — MONTELUKAST SODIUM 10 MG PO TABS: 10.0000 mg | ORAL_TABLET | Freq: Every day | ORAL | Status: DC

## 2011-12-05 ENCOUNTER — Telehealth: Payer: Self-pay | Admitting: Internal Medicine

## 2011-12-05 ENCOUNTER — Encounter: Payer: Self-pay | Admitting: Internal Medicine

## 2011-12-05 NOTE — Telephone Encounter (Signed)
Order sent.

## 2011-12-05 NOTE — Telephone Encounter (Signed)
[facility] RN did some labs and her potassium was high.  She needs a repeat potassium order sent to [facility] for tomorrow.

## 2011-12-06 ENCOUNTER — Telehealth: Payer: Self-pay | Admitting: Internal Medicine

## 2011-12-06 NOTE — Telephone Encounter (Signed)
Order has been resent to [place].

## 2011-12-06 NOTE — Telephone Encounter (Signed)
I called [facility] because when I sent the fax last night for them to draw a BMET on patient the fax did not go through.  When I called the nurse she stated that Dr. Jilda Panda is addressing all of the patients needs right now and they were just sending the labs to you for your records.  She stated she is addressing the magnesium level and the potassium level, so she wanted to know if you still wanted the BMET drawn today.  Patient is out of the hospital and at home.   Sheryl gave me Dr. Halina Andreas cell phone number if you ever needed to talk to her.  She stated she is very easy to get in touch with.  Cell:  [phone].

## 2011-12-06 NOTE — Telephone Encounter (Signed)
If DR Jilda Panda is not ordering a repeat BMEt, it needs to be repeated.

## 2011-12-09 ENCOUNTER — Other Ambulatory Visit: Payer: Self-pay | Admitting: Internal Medicine

## 2011-12-11 ENCOUNTER — Encounter: Payer: Self-pay | Admitting: Internal Medicine

## 2011-12-12 ENCOUNTER — Other Ambulatory Visit: Payer: Self-pay | Admitting: Internal Medicine

## 2011-12-13 MED ORDER — ESCITALOPRAM OXALATE 10 MG PO TABS: 10.0000 mg | ORAL_TABLET | ORAL | Status: DC

## 2011-12-14 ENCOUNTER — Telehealth: Payer: Self-pay | Admitting: Internal Medicine

## 2011-12-14 ENCOUNTER — Encounter: Payer: Self-pay | Admitting: Internal Medicine

## 2011-12-14 MED ORDER — FLUCONAZOLE 150 MG PO TABS: 150.0000 mg | ORAL_TABLET | Freq: Every day | ORAL | Status: AC

## 2011-12-14 NOTE — Telephone Encounter (Signed)
Opened in error

## 2011-12-14 NOTE — Telephone Encounter (Signed)
Done,  Sent to [facility]

## 2011-12-14 NOTE — Telephone Encounter (Signed)
Patient called and stated she is taking antibiotics right now from Dr. Jilda Panda and now she has a yeast infection.  She stated Dr. Jilda Panda only called in one pill of diflucan but the one tablet did not clear it up.  She stated you have called in three tablets for her in the past and wanted to know if you would be willing to prescribe it again.    She also reported Dr. Jilda Panda is treating her low magnesium level.

## 2011-12-14 NOTE — Telephone Encounter (Signed)
Patient notified

## 2011-12-16 ENCOUNTER — Emergency Department: Payer: Self-pay | Admitting: Emergency Medicine

## 2011-12-16 LAB — CBC
MCH: 32.2 pg (ref 26.0–34.0)
MCHC: 32.6 g/dL (ref 32.0–36.0)
MCV: 99 fL (ref 80–100)
RDW: 13 % (ref 11.5–14.5)
WBC: 5.9 10*3/uL (ref 3.6–11.0)

## 2011-12-16 LAB — COMPREHENSIVE METABOLIC PANEL
Albumin: 2.2 g/dL — ABNORMAL LOW (ref 3.4–5.0)
BUN: 22 mg/dL — ABNORMAL HIGH (ref 7–18)
Bilirubin,Total: 0.4 mg/dL (ref 0.2–1.0)
Calcium, Total: 9.6 mg/dL (ref 8.5–10.1)
Creatinine: 1.36 mg/dL — ABNORMAL HIGH (ref 0.60–1.30)
EGFR (African American): 46 — ABNORMAL LOW
Glucose: 91 mg/dL (ref 65–99)
Potassium: 5.1 mmol/L (ref 3.5–5.1)
SGOT(AST): 24 U/L (ref 15–37)
SGPT (ALT): 28 U/L

## 2011-12-17 ENCOUNTER — Other Ambulatory Visit: Payer: Self-pay | Admitting: Internal Medicine

## 2011-12-20 ENCOUNTER — Encounter: Payer: Self-pay | Admitting: Internal Medicine

## 2011-12-25 ENCOUNTER — Other Ambulatory Visit: Payer: Self-pay | Admitting: Internal Medicine

## 2011-12-25 MED ORDER — ZOLPIDEM TARTRATE 5 MG PO TABS: 5.0000 mg | ORAL_TABLET | Freq: Every evening | ORAL | Status: DC | PRN

## 2011-12-27 ENCOUNTER — Encounter: Payer: Self-pay | Admitting: Internal Medicine

## 2012-01-01 ENCOUNTER — Encounter: Payer: Self-pay | Admitting: Internal Medicine

## 2012-01-01 ENCOUNTER — Ambulatory Visit (INDEPENDENT_AMBULATORY_CARE_PROVIDER_SITE_OTHER): Payer: Federal, State, Local not specified - PPO | Admitting: Internal Medicine

## 2012-01-01 ENCOUNTER — Other Ambulatory Visit: Payer: Self-pay | Admitting: Internal Medicine

## 2012-01-01 VITALS — BP 130/60 | HR 60 | Temp 98.2°F | Resp 18 | Wt 206.2 lb

## 2012-01-01 DIAGNOSIS — E039 Hypothyroidism, unspecified: Secondary | ICD-10-CM

## 2012-01-01 DIAGNOSIS — R739 Hyperglycemia, unspecified: Secondary | ICD-10-CM

## 2012-01-01 DIAGNOSIS — R7309 Other abnormal glucose: Secondary | ICD-10-CM

## 2012-01-01 DIAGNOSIS — B373 Candidiasis of vulva and vagina: Secondary | ICD-10-CM

## 2012-01-01 DIAGNOSIS — D594 Other nonautoimmune hemolytic anemias: Secondary | ICD-10-CM

## 2012-01-01 DIAGNOSIS — I10 Essential (primary) hypertension: Secondary | ICD-10-CM

## 2012-01-01 DIAGNOSIS — M1711 Unilateral primary osteoarthritis, right knee: Secondary | ICD-10-CM

## 2012-01-01 DIAGNOSIS — D649 Anemia, unspecified: Secondary | ICD-10-CM | POA: Insufficient documentation

## 2012-01-01 DIAGNOSIS — E785 Hyperlipidemia, unspecified: Secondary | ICD-10-CM

## 2012-01-01 DIAGNOSIS — M549 Dorsalgia, unspecified: Secondary | ICD-10-CM

## 2012-01-01 DIAGNOSIS — R197 Diarrhea, unspecified: Secondary | ICD-10-CM

## 2012-01-01 DIAGNOSIS — M1710 Unilateral primary osteoarthritis, unspecified knee: Secondary | ICD-10-CM

## 2012-01-01 DIAGNOSIS — B3731 Acute candidiasis of vulva and vagina: Secondary | ICD-10-CM

## 2012-01-01 LAB — IRON AND TIBC
Iron: 71 ug/dL (ref 42–145)
TIBC: 293 ug/dL (ref 250–470)

## 2012-01-01 LAB — VITAMIN B12: Vitamin B-12: 363 pg/mL (ref 211–911)

## 2012-01-01 LAB — FERRITIN: Ferritin: 227.8 ng/mL (ref 10.0–291.0)

## 2012-01-01 MED ORDER — OXYCODONE-ACETAMINOPHEN 10-325 MG PO TABS: 1.0000 | ORAL_TABLET | Freq: Four times a day (QID) | ORAL | Status: DC | PRN

## 2012-01-01 MED ORDER — FLUCONAZOLE 150 MG PO TABS: 150.0000 mg | ORAL_TABLET | Freq: Every day | ORAL | Status: AC

## 2012-01-01 MED ORDER — ROSUVASTATIN CALCIUM 20 MG PO TABS: 20.0000 mg | ORAL_TABLET | Freq: Every day | ORAL | Status: DC

## 2012-01-01 NOTE — Progress Notes (Signed)
Patient ID: [name], female   DOB: 05/09/1941, 71 y.o.   MRN: [id number]  Patient Active Problem List  Diagnoses  . Hypertension  . Brain aneurysm  . Chronic back pain greater than 3 months duration  . Myalgia and myositis, unspecified  . Cramps, muscle, general  . Elevated blood sugar  . Breast screening, unspecified  . Osteoarthritis of right knee  . Diarrhea  . 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 Unspecified hypothyroidism    Subjective:  CC:   Chief Complaint  Patient presents with  . Follow-up    HPI:   [name] a 71 y.o. female who presents followup on chronic conditions including hypertension,  Hyperlipidemia,.  Has been receiving IV abx for inner ear infection ,  Finsihed IV abx last Thursday, now on oral zyvox and azithromycin.  Has lost 27 lbs and has had recurrent diarrhea. No cramping or abdominal pain or fevers.  Ear is still draining serosanguinous discharge. Has had a poor appetite.    Past Medical History  Diagnosis Date  . Degenerative disc disease     HERNITAED DISC BY MRI ON 07/07;S/P LAMINECTOMY,RID REPLACEMENT  . Brain aneurysm     S/P RESECTION  . Discoid lupus erythematosus eyelid     NEGATIVE SEROLOGIC MARKERS  . Hypercholesterolemia   . Chronic sinusitis   . Hypertension   . Complication of anesthesia 05/30/2010    from colonoscopy-had Propofol-had a  . Complication of anesthesia     reaction-went into asthma-pneumonia,-  . Complication of anesthesia     then CHF and to ICU  . Dysrhythmia     PVC's occ.  . Pneumonia 05/30/2010    after receiving Propofol  . Headache     migraines occ.  . CHF (congestive heart failure) 05/2010    after receiving Propofol  . Hypothyroidism     takes Synthroid  . Blood transfusion 2009    back surgery  . Sleep apnea     uses CPAP-8.4  . Asthma 06/2010    depending on what around    Past Surgical History  Procedure Date  . Cosmetic surgery   . Tonsillectomy 1963  . Back surgery  1968,1988,2002,2009    4 laminectomies and fusions  . Abdominal hysterectomy 1988    uterus only  . Brain surgery 2005    coiling of cranial aneurysum  . Excision morton's neuroma 1990    left foot  . Nasal sinus surgery 1977-2008  . Lipomas 1975    3 from right breast  . Tympanic membrane repair 2009-last one    x5 times  . Total knee arthroplasty 07/10/2011    Procedure: TOTAL KNEE ARTHROPLASTY;  Surgeon: Loanne Drilling;  Location: [facility];  Service: Orthopedics;  Laterality: Right;         The following portions of the patient's history were reviewed and updated as appropriate: Allergies, current medications, and problem list.    Review of Systems:   12 Pt  review of systems was negative except those addressed in the HPI,     History   Social History  . Marital Status: Married    Spouse Name: N/A    Number of Children: N/A  . Years of Education: N/A   Occupational History  . Not on file.   Social History Main Topics  . Smoking status: Former Smoker -- 0.5 packs/day for 40 years    Types: Cigarettes    Quit date: 08/26/2010  . Smokeless tobacco:  Never Used   Comment: REMOTELY QUIT  TOBACCO USE  . Alcohol Use: Yes     occasional  . Drug Use: No  . Sexually Active: Not on file   Other Topics Concern  . Not on file   Social History Narrative   Light exerciseLIVES WITH SPOUSE    Objective:  BP 130/60  Pulse 60  Temp(Src) 98.2 F (36.8 C) (Oral)  Resp 18  Wt 206 lb 4 oz (93.554 kg)  SpO2 97%  General appearance: alert, cooperative and appears stated age Ears: normal TM's and external ear canals both ears Throat: lips, mucosa, and tongue normal; teeth and gums normal Neck: no adenopathy, no carotid bruit, supple, symmetrical, trachea midline and thyroid not enlarged, symmetric, no tenderness/mass/nodules Back: symmetric, no curvature. ROM normal. No CVA tenderness. Lungs: clear to auscultation bilaterally Heart: regular rate and rhythm, S1, S2  normal, no murmur, click, rub or gallop Abdomen: soft, non-tender; bowel sounds normal; no masses,  no organomegaly Pulses: 2+ and symmetric Skin: Skin color, texture, turgor normal. No rashes or lesions Lymph nodes: Cervical, supraclavicular, and axillary nodes normal.  Assessment and Plan:  Hypertension Well controlled ,, dose of spironolactone was reduced due to recurrent hyperkalemia.  To 25 mg daily   Elevated blood sugar checking hgba1c and fasting glucose .  Diarrhea secondary to prolonged antibiotic use, without fevers, abdominal pain .  Will consider sending stool for c dificile in symptoms continue.  checking lytes and magnesium    Updated Medication List Outpatient Encounter Prescriptions as of 01/01/2012  Medication Sig Dispense Refill  . amLODipine (NORVASC) 10 MG tablet Take 1 tablet (10 mg total) by mouth daily.  30 tablet  5  . benzonatate (TESSALON) 100 MG capsule TAKE ONE CAPSULE BY MOUTH EVERY 4 HOURS AS NEEDED  120 capsule  2  . budesonide-formoterol (SYMBICORT) 80-4.5 MCG/ACT inhaler Inhale 2 puffs into the lungs 2 (two) times daily.        Marland Kitchen desloratadine (CLARINEX) 5 MG tablet Take 1 tablet (5 mg total) by mouth every morning.  90 tablet  3  . diazepam (VALIUM) 5 MG tablet Take 1 tablet (5 mg total) by mouth 2 (two) times daily as needed. Muscle spasm  60 tablet  3  . doxepin (SINEQUAN) 25 MG capsule Take 50 mg by mouth at bedtime.       . hydrALAZINE (APRESOLINE) 50 MG tablet Take 50 mg by mouth 2 (two) times daily.        . isosorbide mononitrate (IMDUR) 60 MG 24 hr tablet Take one by mouth daily  30 tablet  6  . LASIX 20 MG tablet TAKE 1 TABLET BY MOUTH EVERY DAY  90 tablet  3  . lidocaine (LIDODERM) 5 % Place 1 patch onto the skin daily. Remove & Discard patch within 12 hours or as directed by MD       . losartan (COZAAR) 50 MG tablet Take 1 tablet (50 mg total) by mouth 2 (two) times daily.  60 tablet  6  . montelukast (SINGULAIR) 10 MG tablet Take 1 tablet  (10 mg total) by mouth at bedtime.  90 tablet  3  . nebivolol (BYSTOLIC) 5 MG tablet Take 1 tablet (5 mg total) by mouth daily.  30 tablet  3  . oxyCODONE-acetaminophen (PERCOCET) 10-325 MG per tablet Take 1 tablet by mouth every 6 (six) hours as needed.  120 tablet  0  . sodium chloride (OCEAN) 0.65 % nasal spray Place 2 sprays into  the nose as needed. Allergies        . SYNTHROID 150 MCG tablet TAKE 1 TABLET BY MOUTH EVERYDAY  30 tablet  4  . zolpidem (AMBIEN) 5 MG tablet Take 1 tablet (5 mg total) by mouth at bedtime as needed for sleep (BRAND NAME ONLY,  PATIENT HAD ALLERGIC REACTION TO GENERIC).  30 tablet  5  . DISCONTD: oxyCODONE-acetaminophen (PERCOCET) 10-325 MG per tablet Take 1 tablet by mouth every 6 (six) hours as needed.  120 tablet  0  . DISCONTD: rosuvastatin (CRESTOR) 20 MG tablet Take 20 mg by mouth at bedtime.        . fluconazole (DIFLUCAN) 150 MG tablet Take 1 tablet (150 mg total) by mouth daily.  2 tablet  2  . promethazine (PHENERGAN) 12.5 MG tablet Take 1 tablet (12.5 mg total) by mouth every 8 (eight) hours as needed for nausea.  30 tablet  3  . DISCONTD: BYSTOLIC 5 MG tablet TAKE 1 TABLET EVERY DAY  30 tablet  3  . DISCONTD: escitalopram (LEXAPRO) 10 MG tablet Take 1 tablet (10 mg total) by mouth every morning.  90 tablet  3  . DISCONTD: potassium chloride (KLOR-CON 10) 10 MEQ CR tablet Take 10-20 mEq by mouth. Pts take every morning but if she take lasix 40mg  then will take extra dose of potassium      . DISCONTD: rivaroxaban (XARELTO) 10 MG TABS tablet Take 1 tablet (10 mg total) by mouth daily.  16 tablet  0  . DISCONTD: triamcinolone (KENALOG) 0.025 % ointment Apply 1 application topically 2 (two) times daily. itch        Facility-Administered Encounter Medications as of 01/01/2012  Medication Dose Route Frequency Provider Last Rate Last Dose  . chlorhexidine (HIBICLENS) 4 % liquid 4 application  60 mL Topical Once Loanne Drilling, MD      . chlorhexidine  (HIBICLENS) 4 % liquid 4 application  60 mL Topical Once Loanne Drilling, MD         Orders Placed This Encounter  Procedures  . Ferritin  . Vitamin B12  . Folate RBC  . Iron and TIBC  . Protein Electrophoresis, (serum)  . IFE, Urine (with Total Protein)  . Direct LDL  . TSH    No Follow-up on file.

## 2012-01-01 NOTE — Assessment & Plan Note (Signed)
Well controlled ,, dose of spironolactone was reduced due to recurrent hyperkalemia.  To 25 mg daily

## 2012-01-01 NOTE — Assessment & Plan Note (Signed)
checking hgba1c and fasting glucose .

## 2012-01-01 NOTE — Assessment & Plan Note (Signed)
secondary to prolonged antibiotic use, without fevers, abdominal pain .  Will consider sending stool for c dificile in symptoms continue.  checking lytes and magnesium

## 2012-01-03 ENCOUNTER — Telehealth: Payer: Self-pay | Admitting: Internal Medicine

## 2012-01-03 ENCOUNTER — Other Ambulatory Visit: Payer: Self-pay | Admitting: *Deleted

## 2012-01-03 LAB — PROTEIN ELECTROPHORESIS, SERUM
Alpha-1-Globulin: 6.1 % — ABNORMAL HIGH (ref 2.9–4.9)
Alpha-2-Globulin: 15.5 % — ABNORMAL HIGH (ref 7.1–11.8)
Beta 2: 3.6 % (ref 3.2–6.5)
Gamma Globulin: 7.7 % — ABNORMAL LOW (ref 11.1–18.8)
M-Spike, %: 0.15 g/dL

## 2012-01-03 MED ORDER — CYANOCOBALAMIN 1000 MCG/ML IJ SOLN: 1000.0000 ug | INTRAMUSCULAR | Status: DC

## 2012-01-03 MED ORDER — "NEEDLE (DISP) 23G X 1"" MISC": Status: DC

## 2012-01-03 NOTE — Telephone Encounter (Signed)
Patient in needing needles for her B-12 called into the pharmacy.

## 2012-01-03 NOTE — Progress Notes (Signed)
Addended by: Jobie Quaker on: 01/03/2012 02:06 PM   Modules accepted: Orders

## 2012-01-05 LAB — UIFE/LIGHT CHAINS/TP QN, 24-HR UR
Albumin, U: DETECTED
Alpha 2, Urine: DETECTED — AB
Beta, Urine: DETECTED — AB
Free Lambda Lt Chains,Ur: 0.25 mg/dL (ref 0.02–0.67)
Gamma Globulin, Urine: DETECTED — AB

## 2012-01-08 ENCOUNTER — Other Ambulatory Visit: Payer: Self-pay | Admitting: *Deleted

## 2012-01-08 MED ORDER — LIDOCAINE 5 % EX PTCH: 1.0000 | MEDICATED_PATCH | CUTANEOUS | Status: DC

## 2012-01-09 ENCOUNTER — Telehealth: Payer: Self-pay | Admitting: Internal Medicine

## 2012-01-09 ENCOUNTER — Other Ambulatory Visit: Payer: Self-pay | Admitting: Internal Medicine

## 2012-01-09 DIAGNOSIS — D649 Anemia, unspecified: Secondary | ICD-10-CM

## 2012-01-09 NOTE — Telephone Encounter (Signed)
I don't see a referral for this.  Please advise.

## 2012-01-09 NOTE — Telephone Encounter (Signed)
Dr Orlie Dakin

## 2012-01-09 NOTE — Telephone Encounter (Signed)
Pt calling to checking on her hemotology appointment. She wanted to go where dr Darrick Huntsman thinks is best. [phone] Please advise pt

## 2012-01-10 NOTE — Telephone Encounter (Signed)
Patient is aware of this appointment she is really worried that your sending her to Dr. Orlie Dakin for multiple myloma.  She is really upset and would like some reassurance.

## 2012-01-10 NOTE — Telephone Encounter (Signed)
I left msg for patient to return my call so I can advise her of the appointment.

## 2012-01-10 NOTE — Telephone Encounter (Signed)
PATIENT IS SCHEDULED TO SEE DR Orlie Dakin ON 6-6 @ 3:15

## 2012-01-18 ENCOUNTER — Encounter: Payer: Self-pay | Admitting: Internal Medicine

## 2012-01-25 ENCOUNTER — Ambulatory Visit: Payer: Self-pay | Admitting: Oncology

## 2012-01-25 LAB — CBC CANCER CENTER
Basophil #: 0 x10 3/mm (ref 0.0–0.1)
Basophil %: 0.6 %
HCT: 29.4 % — ABNORMAL LOW (ref 35.0–47.0)
HGB: 9.9 g/dL — ABNORMAL LOW (ref 12.0–16.0)
MCHC: 33.7 g/dL (ref 32.0–36.0)
Monocyte #: 0.6 x10 3/mm (ref 0.2–0.9)
Monocyte %: 7.8 %
Neutrophil #: 4.6 x10 3/mm (ref 1.4–6.5)
RBC: 3.05 10*6/uL — ABNORMAL LOW (ref 3.80–5.20)

## 2012-01-25 LAB — BASIC METABOLIC PANEL
Anion Gap: 8 (ref 7–16)
BUN: 31 mg/dL — ABNORMAL HIGH (ref 7–18)
Calcium, Total: 10.1 mg/dL (ref 8.5–10.1)
Co2: 28 mmol/L (ref 21–32)
EGFR (Non-African Amer.): 19 — ABNORMAL LOW
Glucose: 94 mg/dL (ref 65–99)
Osmolality: 286 (ref 275–301)
Potassium: 3.8 mmol/L (ref 3.5–5.1)
Sodium: 140 mmol/L (ref 136–145)

## 2012-01-29 ENCOUNTER — Telehealth: Payer: Self-pay | Admitting: Internal Medicine

## 2012-01-30 LAB — PROT IMMUNOELECTROPHORES(ARMC)

## 2012-01-30 LAB — KAPPA/LAMBDA FREE LIGHT CHAINS (ARMC)

## 2012-02-05 ENCOUNTER — Telehealth: Payer: Self-pay | Admitting: Internal Medicine

## 2012-02-05 ENCOUNTER — Other Ambulatory Visit: Payer: Self-pay | Admitting: Internal Medicine

## 2012-02-05 DIAGNOSIS — M1711 Unilateral primary osteoarthritis, right knee: Secondary | ICD-10-CM

## 2012-02-05 MED ORDER — LIDOCAINE 5 % EX PTCH: 3.0000 | MEDICATED_PATCH | CUTANEOUS | Status: DC

## 2012-02-05 MED ORDER — OXYCODONE-ACETAMINOPHEN 10-325 MG PO TABS: 1.0000 | ORAL_TABLET | Freq: Four times a day (QID) | ORAL | Status: DC | PRN
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
## 2012-02-05 NOTE — Telephone Encounter (Signed)
[phone] Pt called needing rx lidoderm   Stating 3 patches daily [facility]  [phone]

## 2012-02-07 ENCOUNTER — Other Ambulatory Visit: Payer: Self-pay | Admitting: Internal Medicine

## 2012-02-07 MED ORDER — DIAZEPAM 5 MG PO TABS: 5.0000 mg | ORAL_TABLET | Freq: Two times a day (BID) | ORAL | Status: DC | PRN

## 2012-02-13 NOTE — Telephone Encounter (Signed)
error 

## 2012-02-14 ENCOUNTER — Telehealth: Payer: Self-pay | Admitting: Internal Medicine

## 2012-02-14 NOTE — Telephone Encounter (Signed)
Cell [phone] Pt called dr Darrick Huntsman told her to double up on lasik if her feet and ankles swell pt is almost out of her meds  Please send refill [facility] [phone]

## 2012-02-15 MED ORDER — FUROSEMIDE 20 MG PO TABS: 20.0000 mg | ORAL_TABLET | Freq: Every day | ORAL | Status: DC

## 2012-02-15 NOTE — Telephone Encounter (Signed)
Rx sent to pharmacy   

## 2012-02-16 ENCOUNTER — Telehealth: Payer: Self-pay | Admitting: Internal Medicine

## 2012-02-16 MED ORDER — FUROSEMIDE 20 MG PO TABS: ORAL_TABLET | ORAL | Status: DC

## 2012-02-16 NOTE — Telephone Encounter (Signed)
Pt called to get results on 24 urine results, bone survey

## 2012-02-16 NOTE — Telephone Encounter (Signed)
Patient is needing a new prescription for lasix 20 mg 1-2 times a day because she was instructed to increase her lasix to twice a day and she is out needing a prescription faxed today.

## 2012-02-16 NOTE — Telephone Encounter (Signed)
New Rx has been sent in

## 2012-02-19 ENCOUNTER — Ambulatory Visit: Payer: Self-pay | Admitting: Oncology

## 2012-02-20 ENCOUNTER — Other Ambulatory Visit: Payer: Self-pay | Admitting: Internal Medicine

## 2012-02-21 ENCOUNTER — Ambulatory Visit: Payer: Federal, State, Local not specified - PPO | Admitting: Internal Medicine

## 2012-03-02 ENCOUNTER — Other Ambulatory Visit: Payer: Self-pay | Admitting: Internal Medicine

## 2012-03-08 ENCOUNTER — Encounter: Payer: Self-pay | Admitting: Internal Medicine

## 2012-03-08 ENCOUNTER — Ambulatory Visit (INDEPENDENT_AMBULATORY_CARE_PROVIDER_SITE_OTHER): Payer: Federal, State, Local not specified - PPO | Admitting: Internal Medicine

## 2012-03-08 VITALS — BP 122/60 | HR 63 | Temp 98.5°F | Wt 179.0 lb

## 2012-03-08 DIAGNOSIS — M549 Dorsalgia, unspecified: Secondary | ICD-10-CM

## 2012-03-08 DIAGNOSIS — I10 Essential (primary) hypertension: Secondary | ICD-10-CM

## 2012-03-08 DIAGNOSIS — G8929 Other chronic pain: Secondary | ICD-10-CM

## 2012-03-08 DIAGNOSIS — D472 Monoclonal gammopathy: Secondary | ICD-10-CM

## 2012-03-08 DIAGNOSIS — M1711 Unilateral primary osteoarthritis, right knee: Secondary | ICD-10-CM

## 2012-03-08 DIAGNOSIS — B379 Candidiasis, unspecified: Secondary | ICD-10-CM

## 2012-03-08 DIAGNOSIS — M1710 Unilateral primary osteoarthritis, unspecified knee: Secondary | ICD-10-CM

## 2012-03-08 DIAGNOSIS — E669 Obesity, unspecified: Secondary | ICD-10-CM

## 2012-03-08 DIAGNOSIS — D594 Other nonautoimmune hemolytic anemias: Secondary | ICD-10-CM

## 2012-03-08 MED ORDER — OXYCODONE-ACETAMINOPHEN 10-325 MG PO TABS: 1.0000 | ORAL_TABLET | Freq: Four times a day (QID) | ORAL | Status: DC | PRN

## 2012-03-08 MED ORDER — FUROSEMIDE 20 MG PO TABS: ORAL_TABLET | ORAL | Status: DC

## 2012-03-08 MED ORDER — NYSTATIN 100000 UNIT/GM EX POWD: Freq: Four times a day (QID) | CUTANEOUS | Status: DC

## 2012-03-08 NOTE — Progress Notes (Signed)
Patient ID: [name], female   DOB: 03/07/1941, 71 y.o.   MRN: [id number] Patient Active Problem List  Diagnosis  . Hypertension  . Brain aneurysm  . Chronic back pain greater than 3 months duration  . Myalgia and myositis, unspecified  . Cramps, muscle, general  . Elevated blood sugar  . Breast screening, unspecified  . Osteoarthritis of right knee  . Diarrhea  . 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 Unspecified hypothyroidism  . Obesity (BMI 30-39.9)    Subjective:  CC:   Chief Complaint  Patient presents with  . Follow-up    HPI:   [name] a 71 y.o. female who presents for followup on chronic issues. She continues to receive treatment for chronic otitis complicated, but [facility] with Zyvox and azithromycin. The Zyvox has route and her appetite which has helped her achieve a 40 lb wt loss since November  Using a caloric restriction  Diet. .  she is going to have h aving bunion surgery the summer on her right foot .   she is awaiting the results of the bone survey that was done on June 18 a 24-hour urine collection repeated by Dr. Orlie Dakin do to recent finding of abnormal monoclonal monoclonal protein and SPEP that I did do to anemia noticed on last CBC. She has not heard anything from his office and is inquiring as to results of her bone survey.    Past Medical History  Diagnosis Date  . Degenerative disc disease     HERNITAED DISC BY MRI ON 07/07;S/P LAMINECTOMY,RID REPLACEMENT  . Brain aneurysm     S/P RESECTION  . Discoid lupus erythematosus eyelid     NEGATIVE SEROLOGIC MARKERS  . Hypercholesterolemia   . Chronic sinusitis   . Hypertension   . Complication of anesthesia 05/30/2010    from colonoscopy-had Propofol-had a  . Complication of anesthesia     reaction-went into asthma-pneumonia,-  . Complication of anesthesia     then CHF and to ICU  . Dysrhythmia     PVC's occ.  . Pneumonia 05/30/2010    after receiving Propofol  . Headache     migraines occ.  .  CHF (congestive heart failure) 05/2010    after receiving Propofol  . Hypothyroidism     takes Synthroid  . Blood transfusion 2009    back surgery  . Sleep apnea     uses CPAP-8.4  . Asthma 06/2010    depending on what around    Past Surgical History  Procedure Date  . Cosmetic surgery   . Tonsillectomy 1963  . Back surgery 1968,1988,2002,2009    4 laminectomies and fusions  . Abdominal hysterectomy 1988    uterus only  . Brain surgery 2005    coiling of cranial aneurysum  . Excision morton's neuroma 1990    left foot  . Nasal sinus surgery 1977-2008  . Lipomas 1975    3 from right breast  . Tympanic membrane repair 2009-last one    x5 times  . Total knee arthroplasty 07/10/2011    Procedure: TOTAL KNEE ARTHROPLASTY;  Surgeon: Loanne Drilling;  Location: [facility];  Service: Orthopedics;  Laterality: Right;   The following portions of the patient's history were reviewed and updated as appropriate: Allergies, current medications, and problem list.   Review of Systems:  Pertinent positives per HPI.    Review of Systems - General ROS: negative for fevers, chills. Positive for wt loss per HPI.  ENT ROS: negative for - headaches or hearing change (hs chronic hearing loss) Gastrointestinal ROS: no abdominal pain, change in bowel habits, or black or bloody stools Musculoskeletal ROS: negative for - joint pain    History   Social History  . Marital Status: Married    Spouse Name: N/A    Number of Children: N/A  . Years of Education: N/A   Occupational History  . Not on file.   Social History Main Topics  . Smoking status: Former Smoker -- 0.5 packs/day for 40 years    Types: Cigarettes    Quit date: 08/26/2010  . Smokeless tobacco: Never Used   Comment: REMOTELY QUIT  TOBACCO USE  . Alcohol Use: Yes     occasional  . Drug Use: No  . Sexually Active: Not on file   Other Topics Concern  . Not on file   Social History Narrative   Light exerciseLIVES WITH  SPOUSE    Objective:  BP 122/60  Pulse 63  Temp 98.5 F (36.9 C) (Oral)  Wt 179 lb (81.194 kg)  SpO2 97%  General appearance: alert, cooperative and appears stated age Ears: normal TM's and external ear canals both ears Throat: lips, mucosa, and tongue normal; teeth and gums normal Neck: no adenopathy, no carotid bruit, supple, symmetrical, trachea midline and thyroid not enlarged, symmetric, no tenderness/mass/nodules Back: symmetric, no curvature. ROM normal. No CVA tenderness. Lungs: clear to auscultation bilaterally Heart: regular rate and rhythm, S1, S2 normal, no murmur, click, rub or gallop Abdomen: soft, non-tender; bowel sounds normal; no masses,  no organomegaly Pulses: 2+ and symmetric Skin: Skin color, texture, turgor normal. No rashes or lesions Lymph nodes: Cervical, supraclavicular, and axillary nodes normal.  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 She was referred to Dr. Orlie Dakin for evaluation. I gave her the results of her bone survey that was done one month ago. The bone survey was normal .  I suspect her diagnosis will be MG [place] will have her follow up with Dr. Orlie Dakin as directed.  Chronic back pain greater than 3 months duration Secondary to degenerative disc disease and mild spinal stenosis. Managed with Percocet. No change in symptoms. Refill given today.  Hypertension Well-controlled on current regimen. No changes today.  Obesity (BMI 30-39.9) Improving, with pound weight loss since November. Patient is achieving success using a Caloric restriction diet. Exercise is limited to walking and swimming dueto chronic back pain and recent total right knee replacement   Monoclonal gammopathy of undetermined significance Presumed with abnormal SPEP normal bone survey and 20 for her urine collection results still pending. Patient will follow up with Dr.  Orlie Dakin.   Updated Medication List Outpatient Encounter Prescriptions as of 03/08/2012  Medication Sig Dispense Refill  . amLODipine (NORVASC) 10 MG tablet Take 1 tablet (10 mg total) by mouth daily.  30 tablet  5  . azithromycin (ZITHROMAX) 250 MG tablet Take 250 mg by mouth daily.       . benzonatate (TESSALON) 100 MG capsule TAKE ONE CAPSULE BY MOUTH EVERY 4 HOURS AS NEEDED  120 capsule  2  . budesonide-formoterol (SYMBICORT) 80-4.5 MCG/ACT inhaler Inhale 2 puffs into the lungs 2 (two) times daily.        . cyanocobalamin (,VITAMIN B-12,) 1000 MCG/ML injection Inject 1 mL (1,000 mcg total) into the muscle every 30 (thirty) days.  1 mL  3  . desloratadine (  CLARINEX) 5 MG tablet Take 1 tablet (5 mg total) by mouth every morning.  90 tablet  3  . diazepam (VALIUM) 5 MG tablet Take 1 tablet (5 mg total) by mouth 2 (two) times daily as needed. Muscle spasm  60 tablet  3  . doxepin (SINEQUAN) 25 MG capsule Take 50 mg by mouth at bedtime.       . furosemide (LASIX) 20 MG tablet Take 1-2 tablets daily as needed for fluid retention.  180 tablet  3  . hydrALAZINE (APRESOLINE) 100 MG tablet TAKE 1 TABLET TWICE A DAY  180 tablet  3  . isosorbide mononitrate (IMDUR) 60 MG 24 hr tablet Take one by mouth daily  30 tablet  6  . lidocaine (LIDODERM) 5 % Place 3 patches onto the skin daily. Remove & Discard patch within 12 hours or as directed by MD  90 patch  3  . losartan (COZAAR) 50 MG tablet Take 1 tablet (50 mg total) by mouth 2 (two) times daily.  60 tablet  6  . LOVAZA 1 G capsule TAKE 2 CAPSULES TWICE A DAY  360 capsule  6  . montelukast (SINGULAIR) 10 MG tablet Take 1 tablet (10 mg total) by mouth at bedtime.  90 tablet  3  . nebivolol (BYSTOLIC) 5 MG tablet Take 1 tablet (5 mg total) by mouth daily.  30 tablet  3  . NEEDLE, DISP, 23 G (BD DISP NEEDLE) 23G X 1" MISC Uses to inject b12 into muscle once monthly  12 each  1  . oxyCODONE-acetaminophen (PERCOCET) 10-325 MG per tablet Take 1 tablet by  mouth every 6 (six) hours as needed.  120 tablet  0  . rosuvastatin (CRESTOR) 20 MG tablet Take 1 tablet (20 mg total) by mouth at bedtime.  30 tablet  3  . sodium chloride (OCEAN) 0.65 % nasal spray Place 2 sprays into the nose as needed. Allergies        . SYNTHROID 150 MCG tablet TAKE 1 TABLET BY MOUTH EVERYDAY  30 tablet  4  . zolpidem (AMBIEN) 5 MG tablet Take 1 tablet (5 mg total) by mouth at bedtime as needed for sleep (BRAND NAME ONLY,  PATIENT HAD ALLERGIC REACTION TO GENERIC).  30 tablet  5  . ZYVOX 600 MG tablet Take 600 mg by mouth daily.       Marland Kitchen DISCONTD: furosemide (LASIX) 20 MG tablet Take 1-2 tablets daily as needed for fluid retention.  90 tablet  3  . DISCONTD: oxyCODONE-acetaminophen (PERCOCET) 10-325 MG per tablet Take 1 tablet by mouth every 6 (six) hours as needed.  120 tablet  0  . nystatin (MYCOSTATIN) powder Apply topically 4 (four) times daily.  15 g  0  . promethazine (PHENERGAN) 12.5 MG tablet Take 1 tablet (12.5 mg total) by mouth every 8 (eight) hours as needed for nausea.  30 tablet  3  . spironolactone (ALDACTONE) 25 MG tablet Take 25 mg by mouth daily.       Marland Kitchen DISCONTD: hydrALAZINE (APRESOLINE) 50 MG tablet Take 50 mg by mouth 2 (two) times daily.         Facility-Administered Encounter Medications as of 03/08/2012  Medication Dose Route Frequency Provider Last Rate Last Dose  . chlorhexidine (HIBICLENS) 4 % liquid 4 application  60 mL Topical Once Loanne Drilling, MD      . chlorhexidine (HIBICLENS) 4 % liquid 4 application  60 mL Topical Once Loanne Drilling, MD

## 2012-03-10 ENCOUNTER — Encounter: Payer: Self-pay | Admitting: Internal Medicine

## 2012-03-10 DIAGNOSIS — IMO0002 Reserved for concepts with insufficient information to code with codable children: Secondary | ICD-10-CM | POA: Insufficient documentation

## 2012-03-10 DIAGNOSIS — D472 Monoclonal gammopathy: Secondary | ICD-10-CM | POA: Insufficient documentation

## 2012-03-10 NOTE — Assessment & Plan Note (Signed)
Presumed with abnormal SPEP normal bone survey and 20 for her urine collection results still pending. Patient will follow up with Dr. Orlie Dakin.

## 2012-03-10 NOTE — Assessment & Plan Note (Signed)
Improving, with pound weight loss since November. Patient is achieving success using a Caloric restriction diet. Exercise is limited to walking and swimming dueto chronic back pain and recent total right knee replacement

## 2012-03-10 NOTE — Assessment & Plan Note (Signed)
Recent SPEP done during workup of anemia was abnormal suggesting a possible M spike. She was referred to Dr. Orlie Dakin for evaluation. I gave her the results of her bone survey that was done one month ago. The bone survey was normal .  I suspect her diagnosis will be MG [place] will have her follow up with Dr. Orlie Dakin as directed.

## 2012-03-10 NOTE — Assessment & Plan Note (Signed)
Secondary to degenerative disc disease and mild spinal stenosis. Managed with Percocet. No change in symptoms. Refill given today.

## 2012-03-10 NOTE — Assessment & Plan Note (Signed)
Well-controlled on current regimen. No changes today. 

## 2012-03-18 ENCOUNTER — Other Ambulatory Visit: Payer: Self-pay | Admitting: Internal Medicine

## 2012-03-31 ENCOUNTER — Other Ambulatory Visit: Payer: Self-pay | Admitting: Internal Medicine

## 2012-04-08 ENCOUNTER — Telehealth: Payer: Self-pay | Admitting: Internal Medicine

## 2012-04-08 DIAGNOSIS — M1711 Unilateral primary osteoarthritis, right knee: Secondary | ICD-10-CM

## 2012-04-08 MED ORDER — SYNTHROID 150 MCG PO TABS: 150.0000 ug | ORAL_TABLET | Freq: Every day | ORAL | Status: DC

## 2012-04-08 MED ORDER — OXYCODONE-ACETAMINOPHEN 10-325 MG PO TABS: 1.0000 | ORAL_TABLET | Freq: Four times a day (QID) | ORAL | Status: DC | PRN

## 2012-04-08 NOTE — Telephone Encounter (Signed)
Patient is going to have her labs done at [facility]. Refill on Percocet 10-325 mg.

## 2012-04-08 NOTE — Telephone Encounter (Signed)
Rx printed waiting for signature. 

## 2012-04-08 NOTE — Telephone Encounter (Signed)
Infectious disease doctor faxed over an order per Ms. Cales' voice mail for lab work . She is wanting to know when she can come in to have it done.

## 2012-04-08 NOTE — Telephone Encounter (Signed)
Pt is going to go [facility]

## 2012-04-17 ENCOUNTER — Ambulatory Visit: Payer: Federal, State, Local not specified - PPO | Admitting: Internal Medicine

## 2012-04-19 ENCOUNTER — Other Ambulatory Visit: Payer: Self-pay | Admitting: Internal Medicine

## 2012-04-19 MED ORDER — DOXEPIN HCL 25 MG PO CAPS: 50.0000 mg | ORAL_CAPSULE | Freq: Every day | ORAL | Status: DC

## 2012-04-23 ENCOUNTER — Other Ambulatory Visit: Payer: Self-pay | Admitting: Internal Medicine

## 2012-04-23 ENCOUNTER — Other Ambulatory Visit: Payer: Self-pay | Admitting: *Deleted

## 2012-04-23 MED ORDER — BENZONATATE 100 MG PO CAPS: 100.0000 mg | ORAL_CAPSULE | ORAL | Status: DC

## 2012-05-02 ENCOUNTER — Ambulatory Visit: Payer: Self-pay | Admitting: Oncology

## 2012-05-03 LAB — FREE K+L LT CHAIN, QT, UR (24 HOUR)

## 2012-05-06 LAB — KAPPA/LAMBDA FREE LIGHT CHAINS (ARMC)

## 2012-05-08 ENCOUNTER — Other Ambulatory Visit: Payer: Self-pay | Admitting: Internal Medicine

## 2012-05-08 MED ORDER — ROSUVASTATIN CALCIUM 20 MG PO TABS: 20.0000 mg | ORAL_TABLET | Freq: Every day | ORAL | Status: DC

## 2012-05-13 ENCOUNTER — Telehealth: Payer: Self-pay | Admitting: Internal Medicine

## 2012-05-13 MED ORDER — ROSUVASTATIN CALCIUM 20 MG PO TABS: 20.0000 mg | ORAL_TABLET | Freq: Every day | ORAL | Status: DC

## 2012-05-13 NOTE — Telephone Encounter (Signed)
crestor 20 mg tablet take 1 tablet by mouth at bedtime. NEED ASAP

## 2012-05-15 DIAGNOSIS — N181 Chronic kidney disease, stage 1: Secondary | ICD-10-CM | POA: Insufficient documentation

## 2012-05-15 DIAGNOSIS — F3289 Other specified depressive episodes: Secondary | ICD-10-CM | POA: Insufficient documentation

## 2012-05-15 DIAGNOSIS — N289 Disorder of kidney and ureter, unspecified: Secondary | ICD-10-CM | POA: Insufficient documentation

## 2012-05-15 DIAGNOSIS — E039 Hypothyroidism, unspecified: Secondary | ICD-10-CM | POA: Insufficient documentation

## 2012-05-15 DIAGNOSIS — H9192 Unspecified hearing loss, left ear: Secondary | ICD-10-CM | POA: Insufficient documentation

## 2012-05-15 DIAGNOSIS — J329 Chronic sinusitis, unspecified: Secondary | ICD-10-CM | POA: Insufficient documentation

## 2012-05-15 DIAGNOSIS — G8929 Other chronic pain: Secondary | ICD-10-CM | POA: Insufficient documentation

## 2012-05-15 DIAGNOSIS — L930 Discoid lupus erythematosus: Secondary | ICD-10-CM | POA: Insufficient documentation

## 2012-05-15 DIAGNOSIS — M329 Systemic lupus erythematosus, unspecified: Secondary | ICD-10-CM

## 2012-05-15 DIAGNOSIS — F329 Major depressive disorder, single episode, unspecified: Secondary | ICD-10-CM | POA: Insufficient documentation

## 2012-05-15 DIAGNOSIS — H6690 Otitis media, unspecified, unspecified ear: Secondary | ICD-10-CM | POA: Insufficient documentation

## 2012-05-15 DIAGNOSIS — H6692 Otitis media, unspecified, left ear: Secondary | ICD-10-CM | POA: Insufficient documentation

## 2012-05-15 DIAGNOSIS — I5030 Unspecified diastolic (congestive) heart failure: Secondary | ICD-10-CM | POA: Insufficient documentation

## 2012-05-15 HISTORY — DX: Systemic lupus erythematosus, unspecified: M32.9

## 2012-05-20 ENCOUNTER — Telehealth: Payer: Self-pay | Admitting: Internal Medicine

## 2012-05-20 ENCOUNTER — Other Ambulatory Visit: Payer: Self-pay | Admitting: Internal Medicine

## 2012-05-20 ENCOUNTER — Other Ambulatory Visit: Payer: Self-pay

## 2012-05-20 DIAGNOSIS — M1711 Unilateral primary osteoarthritis, right knee: Secondary | ICD-10-CM

## 2012-05-20 NOTE — Telephone Encounter (Signed)
imdur er 60mg  tablet 30 each prescribed refills 6 Date written 10/02/11  Last filled 04/13/12

## 2012-05-20 NOTE — Telephone Encounter (Signed)
Refill on Percocet 10-325 mg.

## 2012-05-21 ENCOUNTER — Ambulatory Visit: Payer: Self-pay | Admitting: Oncology

## 2012-05-22 ENCOUNTER — Other Ambulatory Visit: Payer: Self-pay | Admitting: Internal Medicine

## 2012-05-22 MED ORDER — ISOSORBIDE MONONITRATE ER 60 MG PO TB24: ORAL_TABLET | ORAL | Status: DC

## 2012-05-22 NOTE — Telephone Encounter (Signed)
Please call [phone] Pt is needing refill on Ambien and her Percocet.

## 2012-05-22 NOTE — Telephone Encounter (Signed)
Last filled 04/08/2012 

## 2012-05-23 ENCOUNTER — Telehealth: Payer: Self-pay | Admitting: Internal Medicine

## 2012-05-23 ENCOUNTER — Other Ambulatory Visit: Payer: Self-pay

## 2012-05-23 MED ORDER — OXYCODONE-ACETAMINOPHEN 10-325 MG PO TABS: 1.0000 | ORAL_TABLET | Freq: Four times a day (QID) | ORAL | Status: DC | PRN

## 2012-05-23 MED ORDER — ZOLPIDEM TARTRATE 5 MG PO TABS: 5.0000 mg | ORAL_TABLET | Freq: Every evening | ORAL | Status: DC | PRN

## 2012-05-23 NOTE — Telephone Encounter (Signed)
[phone] Pt is checking on ambien rx [facility] Please advise pt when called

## 2012-05-27 ENCOUNTER — Telehealth: Payer: Self-pay | Admitting: Internal Medicine

## 2012-05-27 NOTE — Telephone Encounter (Signed)
Spoke with Mitch @ [employer] Ambien authorized for one year back dating to August 7th 2013 until August 77, 2014. Called [facility] in [place] medication refill was ran and processed without difficulty.

## 2012-05-27 NOTE — Telephone Encounter (Signed)
Called patient aware that Anbiem has been approved  For 1 year and has been called to her pharmacy. Patient verbalize understanding.

## 2012-05-30 NOTE — Telephone Encounter (Signed)
Received PA for Ambien 5mg .  Authorization valid from 03/27/2012 through 05/27/2013.

## 2012-05-31 ENCOUNTER — Other Ambulatory Visit: Payer: Self-pay | Admitting: Internal Medicine

## 2012-06-07 ENCOUNTER — Ambulatory Visit: Payer: Federal, State, Local not specified - PPO | Admitting: Internal Medicine

## 2012-06-10 ENCOUNTER — Ambulatory Visit: Payer: Federal, State, Local not specified - PPO | Admitting: Internal Medicine

## 2012-06-11 ENCOUNTER — Encounter: Payer: Self-pay | Admitting: Internal Medicine

## 2012-06-11 ENCOUNTER — Ambulatory Visit (INDEPENDENT_AMBULATORY_CARE_PROVIDER_SITE_OTHER): Payer: Federal, State, Local not specified - PPO | Admitting: Internal Medicine

## 2012-06-11 VITALS — BP 120/60 | HR 58 | Temp 98.2°F | Ht 62.5 in | Wt 187.5 lb

## 2012-06-11 DIAGNOSIS — M549 Dorsalgia, unspecified: Secondary | ICD-10-CM

## 2012-06-11 DIAGNOSIS — Z23 Encounter for immunization: Secondary | ICD-10-CM

## 2012-06-11 DIAGNOSIS — I10 Essential (primary) hypertension: Secondary | ICD-10-CM

## 2012-06-11 DIAGNOSIS — M1710 Unilateral primary osteoarthritis, unspecified knee: Secondary | ICD-10-CM

## 2012-06-11 DIAGNOSIS — E669 Obesity, unspecified: Secondary | ICD-10-CM

## 2012-06-11 DIAGNOSIS — D472 Monoclonal gammopathy: Secondary | ICD-10-CM

## 2012-06-11 DIAGNOSIS — M1711 Unilateral primary osteoarthritis, right knee: Secondary | ICD-10-CM

## 2012-06-11 DIAGNOSIS — G8929 Other chronic pain: Secondary | ICD-10-CM

## 2012-06-11 MED ORDER — OXYCODONE-ACETAMINOPHEN 10-325 MG PO TABS: 1.0000 | ORAL_TABLET | Freq: Four times a day (QID) | ORAL | Status: DC | PRN

## 2012-06-11 NOTE — Progress Notes (Signed)
Patient ID: [name], female   DOB: October 31, 1940, 71 y.o.   MRN: [id number]  Patient Active Problem List  Diagnosis  . Hypertension  . Brain aneurysm  . Chronic back pain greater than 3 months duration  . Elevated blood sugar  . Breast screening, unspecified  . Osteoarthritis of right knee  . 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 Unspecified hypothyroidism  . Obesity (BMI 30-39.9)  . Monoclonal gammopathy of undetermined significance    Subjective:  CC:   Chief Complaint  Patient presents with  . Follow-up    HPI:   [name] a 71 y.o. female who presents for Follow up on chronic conditions.  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  [facility] is putting a port in for IV antibiotic therapy and she is Seeing ID at [facility]. Marland Kitchen  She has had a Second opinion at [facility] done for both ENT and ID who recommended immediate surgery on the mastoid bone , which [facility] disagreed with.  She has had an  An MRI of the brain which showed an undisclosed problem with the artery to the mastoid bone. 2) Follow  up on hypertension.  She stopped the spironolactone but is taking her other medications as prescribed.  Home bps have been normal.     Past Medical History  Diagnosis Date  . Degenerative disc disease     HERNITAED DISC BY MRI ON 07/07;S/P LAMINECTOMY,RID REPLACEMENT  . Brain aneurysm     S/P RESECTION  . Discoid lupus erythematosus eyelid     NEGATIVE SEROLOGIC MARKERS  . Hypercholesterolemia   . Chronic sinusitis   . Hypertension   . Complication of anesthesia 05/30/2010    from colonoscopy-had Propofol-had a  . Complication of anesthesia     reaction-went into asthma-pneumonia,-  . Complication of anesthesia     then CHF and to ICU  . Dysrhythmia     PVC's occ.  . Pneumonia 05/30/2010    after receiving Propofol  . Headache     migraines occ.  . CHF (congestive heart failure) 05/2010    after receiving Propofol  . Hypothyroidism     takes  Synthroid  . Blood transfusion 2009    back surgery  . Sleep apnea     uses CPAP-8.4  . Asthma 06/2010    depending on what around    Past Surgical History  Procedure Date  . Cosmetic surgery   . Tonsillectomy 1963  . Back surgery 1968,1988,2002,2009    4 laminectomies and fusions  . Abdominal hysterectomy 1988    uterus only  . Brain surgery 2005    coiling of cranial aneurysum  . Excision morton's neuroma 1990    left foot  . Nasal sinus surgery 1977-2008  . Lipomas 1975    3 from right breast  . Tympanic membrane repair 2009-last one    x5 times  . Total knee arthroplasty 07/10/2011    Procedure: TOTAL KNEE ARTHROPLASTY;  Surgeon: Loanne Drilling;  Location: [facility];  Service: Orthopedics;  Laterality: Right;  . Joint replacement 2013    Right knee,  Alucio         The following portions of the patient's history were reviewed and updated as appropriate: Allergies, current medications, and problem list.    Review of Systems:   12 Pt  review of systems was negative except those addressed in the HPI,     History   Social History  .  Marital Status: Married    Spouse Name: N/A    Number of Children: N/A  . Years of Education: N/A   Occupational History  . Not on file.   Social History Main Topics  . Smoking status: Former Smoker -- 0.5 packs/day for 40 years    Types: Cigarettes    Quit date: 08/26/2010  . Smokeless tobacco: Never Used   Comment: REMOTELY QUIT  TOBACCO USE  . Alcohol Use: Yes     occasional  . Drug Use: No  . Sexually Active: Not on file   Other Topics Concern  . Not on file   Social History Narrative   Light exerciseLIVES WITH SPOUSE    Objective:  BP 120/60  Pulse 58  Temp 98.2 F (36.8 C) (Oral)  Ht 5' 2.5" (1.588 m)  Wt 187 lb 8 oz (85.049 kg)  BMI 33.75 kg/m2  SpO2 97%  General appearance: alert, cooperative and appears stated age Ears: normal TM's and external ear canals both ears Throat: lips, mucosa, and  tongue normal; teeth and gums normal Neck: no adenopathy, no carotid bruit, supple, symmetrical, trachea midline and thyroid not enlarged, symmetric, no tenderness/mass/nodules Back: symmetric, no curvature. ROM normal. No CVA tenderness. Lungs: clear to auscultation bilaterally Heart: regular rate and rhythm, S1, S2 normal, no murmur, click, rub or gallop Abdomen: soft, non-tender; bowel sounds normal; no masses,  no organomegaly Pulses: 2+ and symmetric Skin: Skin color, texture, turgor normal. No rashes or lesions Lymph nodes: Cervical, supraclavicular, and axillary nodes normal.  Assessment and Plan:  Hypertension Well controlled on current medications.  No changes today.  Chronic back pain greater than 3 months duration Secondary to spinal stenosis.  Managed with percocet .    Obesity (BMI 30-39.9) She has gained soe of the weight back that she lost while on Zyvox. I have addressed  BMI and recommended a low glycemic index diet utilizing smaller more frequent meals to increase metabolism.  I have also recommended that patient start exercising with a goal of 30 minutes of aerobic exercise a minimum of 5 days per week.   Monoclonal gammopathy of undetermined significance Now managed by Dr. Orlie Dakin.  No signs of MM by recent bone survey.    Updated Medication List Outpatient Encounter Prescriptions as of 06/11/2012  Medication Sig Dispense Refill  . azithromycin (ZITHROMAX) 250 MG tablet Take 250 mg by mouth daily.       . benzonatate (TESSALON) 100 MG capsule Take 1 capsule (100 mg total) by mouth every 4 (four) hours.  120 capsule  2  . budesonide-formoterol (SYMBICORT) 80-4.5 MCG/ACT inhaler Inhale 2 puffs into the lungs 2 (two) times daily.        Marland Kitchen BYSTOLIC 5 MG tablet TAKE 1 TABLET EVERY DAY  30 tablet  3  . cyanocobalamin (,VITAMIN B-12,) 1000 MCG/ML injection Inject 1 mL (1,000 mcg total) into the muscle every 30 (thirty) days.  1 mL  3  . desloratadine (CLARINEX) 5 MG  tablet Take 1 tablet (5 mg total) by mouth every morning.  90 tablet  3  . diazepam (VALIUM) 5 MG tablet Take 1 tablet (5 mg total) by mouth 2 (two) times daily as needed. Muscle spasm  60 tablet  3  . doxepin (SINEQUAN) 25 MG capsule Take 2 capsules (50 mg total) by mouth at bedtime.  180 capsule  3  . furosemide (LASIX) 20 MG tablet Take 1-2 tablets daily as needed for fluid retention.  180 tablet  3  .  hydrALAZINE (APRESOLINE) 100 MG tablet TAKE 1 TABLET TWICE A DAY  180 tablet  3  . isosorbide mononitrate (IMDUR) 60 MG 24 hr tablet Take one by mouth daily  30 tablet  6  . LIDODERM 5 % PLACE 3 PATCHES ONTO THE SKIN DAILY. REMOVE & DISCARD PATCH WITHIN 12 HOURS OR AS DIRECTED BY MD  90 patch  3  . losartan (COZAAR) 50 MG tablet Take 1 tablet (50 mg total) by mouth 2 (two) times daily.  60 tablet  6  . LOVAZA 1 G capsule TAKE 2 CAPSULES TWICE A DAY  360 capsule  6  . montelukast (SINGULAIR) 10 MG tablet Take 1 tablet (10 mg total) by mouth at bedtime.  90 tablet  3  . nebivolol (BYSTOLIC) 5 MG tablet Take 1 tablet (5 mg total) by mouth daily.  30 tablet  3  . NEEDLE, DISP, 23 G (BD DISP NEEDLE) 23G X 1" MISC Uses to inject b12 into muscle once monthly  12 each  1  . NORVASC 10 MG tablet TAKE 1 TABLET (10 MG TOTAL) BY MOUTH DAILY.  30 tablet  5  . nystatin (MYCOSTATIN) powder Apply topically 4 (four) times daily.  15 g  0  . oxyCODONE-acetaminophen (PERCOCET) 10-325 MG per tablet Take 1 tablet by mouth every 6 (six) hours as needed.  120 tablet  0  . rosuvastatin (CRESTOR) 20 MG tablet Take 1 tablet (20 mg total) by mouth at bedtime.  30 tablet  3  . sodium chloride (OCEAN) 0.65 % nasal spray Place 2 sprays into the nose as needed. Allergies        . SYNTHROID 150 MCG tablet Take 1 tablet (150 mcg total) by mouth daily.  30 tablet  5  . zolpidem (AMBIEN) 5 MG tablet Take 1 tablet (5 mg total) by mouth at bedtime as needed for sleep (BRAND NAME ONLY,  PATIENT HAD ALLERGIC REACTION TO GENERIC).  30  tablet  5  . DISCONTD: oxyCODONE-acetaminophen (PERCOCET) 10-325 MG per tablet Take 1 tablet by mouth every 6 (six) hours as needed.  120 tablet  0  . promethazine (PHENERGAN) 12.5 MG tablet Take 1 tablet (12.5 mg total) by mouth every 8 (eight) hours as needed for nausea.  30 tablet  3  . DISCONTD: spironolactone (ALDACTONE) 25 MG tablet TAKE 1 TABLET EVERY DAY  30 tablet  3  . DISCONTD: ZYVOX 600 MG tablet Take 600 mg by mouth daily.        Facility-Administered Encounter Medications as of 06/11/2012  Medication Dose Route Frequency Provider Last Rate Last Dose  . chlorhexidine (HIBICLENS) 4 % liquid 4 application  60 mL Topical Once Loanne Drilling, MD      . DISCONTD: chlorhexidine (HIBICLENS) 4 % liquid 4 application  60 mL Topical Once Loanne Drilling, MD         Orders Placed This Encounter  Procedures  . Tdap vaccine greater than or equal to 7yo IM    No Follow-up on file.

## 2012-06-13 DIAGNOSIS — D472 Monoclonal gammopathy: Secondary | ICD-10-CM | POA: Insufficient documentation

## 2012-06-13 NOTE — Assessment & Plan Note (Signed)
Secondary to spinal stenosis.  Managed with percocet .

## 2012-06-13 NOTE — Assessment & Plan Note (Signed)
Well controlled on current medications.  No changes today. 

## 2012-06-13 NOTE — Assessment & Plan Note (Addendum)
She has gained soe of the weight back that she lost while on Zyvox. I have addressed  BMI and recommended a low glycemic index diet utilizing smaller more frequent meals to increase metabolism.  I have also recommended that patient start exercising with a goal of 30 minutes of aerobic exercise a minimum of 5 days per week.

## 2012-06-13 NOTE — Assessment & Plan Note (Addendum)
Now managed by Dr. Orlie Dakin.  No signs of MM by recent bone survey.

## 2012-06-23 ENCOUNTER — Other Ambulatory Visit: Payer: Self-pay | Admitting: Internal Medicine

## 2012-06-23 NOTE — Telephone Encounter (Signed)
Ok to refill,  Authorized in epic and printed  

## 2012-06-24 ENCOUNTER — Other Ambulatory Visit: Payer: Self-pay | Admitting: *Deleted

## 2012-06-24 NOTE — Telephone Encounter (Signed)
Rx called to [facility].

## 2012-07-12 ENCOUNTER — Other Ambulatory Visit: Payer: Self-pay | Admitting: Internal Medicine

## 2012-07-12 DIAGNOSIS — A319 Mycobacterial infection, unspecified: Secondary | ICD-10-CM | POA: Insufficient documentation

## 2012-07-12 DIAGNOSIS — E8809 Other disorders of plasma-protein metabolism, not elsewhere classified: Secondary | ICD-10-CM

## 2012-07-12 NOTE — Progress Notes (Signed)
Spoke to patient she will come in the office next week and do a urine sample.

## 2012-07-15 ENCOUNTER — Other Ambulatory Visit (INDEPENDENT_AMBULATORY_CARE_PROVIDER_SITE_OTHER): Payer: Federal, State, Local not specified - PPO | Admitting: *Deleted

## 2012-07-15 DIAGNOSIS — E8809 Other disorders of plasma-protein metabolism, not elsewhere classified: Secondary | ICD-10-CM

## 2012-07-16 LAB — MICROALBUMIN / CREATININE URINE RATIO
Creatinine,U: 22.4 mg/dL
Microalb Creat Ratio: 1.8 mg/g (ref 0.0–30.0)
Microalb, Ur: 0.4 mg/dL (ref 0.0–1.9)

## 2012-07-16 LAB — PROTEIN, URINE, RANDOM: Total Protein, Urine: 3 mg/dL

## 2012-07-20 ENCOUNTER — Other Ambulatory Visit: Payer: Self-pay | Admitting: Internal Medicine

## 2012-07-24 ENCOUNTER — Other Ambulatory Visit: Payer: Self-pay

## 2012-07-25 ENCOUNTER — Other Ambulatory Visit: Payer: Self-pay

## 2012-07-27 ENCOUNTER — Other Ambulatory Visit: Payer: Self-pay | Admitting: Internal Medicine

## 2012-07-29 ENCOUNTER — Other Ambulatory Visit: Payer: Self-pay

## 2012-07-29 MED ORDER — CYANOCOBALAMIN 1000 MCG/ML IJ SOLN: 1000.0000 ug | INTRAMUSCULAR | Status: DC

## 2012-07-29 NOTE — Telephone Encounter (Signed)
[facility]ill request from [facility] for Cyanocobalamin 1000 mcg. Ok to refill?

## 2012-07-31 ENCOUNTER — Other Ambulatory Visit: Payer: Self-pay | Admitting: Internal Medicine

## 2012-07-31 DIAGNOSIS — M1711 Unilateral primary osteoarthritis, right knee: Secondary | ICD-10-CM

## 2012-07-31 NOTE — Telephone Encounter (Signed)
Refill on her Ambien 5 mg ,and  Percocet 10-325 mg.

## 2012-07-31 NOTE — Telephone Encounter (Signed)
Patient called wanting refills on Ambien 5 mg and Percocet 10-325 mg, I checked the chart Ambien was filled 10/13 with 5 R but patient stated she was told there was no more refills on it , she needed a new Rx.

## 2012-08-01 ENCOUNTER — Other Ambulatory Visit: Payer: Self-pay

## 2012-08-02 ENCOUNTER — Other Ambulatory Visit: Payer: Self-pay | Admitting: Internal Medicine

## 2012-08-02 MED ORDER — ZOLPIDEM TARTRATE 5 MG PO TABS: 5.0000 mg | ORAL_TABLET | Freq: Every evening | ORAL | Status: DC | PRN

## 2012-08-02 MED ORDER — OXYCODONE-ACETAMINOPHEN 10-325 MG PO TABS: 1.0000 | ORAL_TABLET | Freq: Four times a day (QID) | ORAL | Status: DC | PRN

## 2012-08-02 NOTE — Telephone Encounter (Signed)
Ok to [employer].  She will have to wait on the oxycodone until Monday since I am out of town/

## 2012-08-05 ENCOUNTER — Other Ambulatory Visit: Payer: Self-pay

## 2012-08-05 ENCOUNTER — Other Ambulatory Visit: Payer: Self-pay | Admitting: Internal Medicine

## 2012-08-05 DIAGNOSIS — M1711 Unilateral primary osteoarthritis, right knee: Secondary | ICD-10-CM

## 2012-08-05 MED ORDER — OXYCODONE-ACETAMINOPHEN 10-325 MG PO TABS: 1.0000 | ORAL_TABLET | Freq: Four times a day (QID) | ORAL | Status: DC | PRN

## 2012-08-05 MED ORDER — NYSTATIN 100000 UNIT/GM EX POWD: Freq: Four times a day (QID) | CUTANEOUS | Status: DC

## 2012-08-05 NOTE — Telephone Encounter (Signed)
Refill request for Nystatin sent to [facility]

## 2012-08-16 ENCOUNTER — Other Ambulatory Visit: Payer: Self-pay | Admitting: Internal Medicine

## 2012-08-16 MED ORDER — VITAFOL-PN PO TABS: ORAL_TABLET | ORAL | Status: DC

## 2012-08-16 MED ORDER — ROSUVASTATIN CALCIUM 20 MG PO TABS: 20.0000 mg | ORAL_TABLET | Freq: Every day | ORAL | Status: DC

## 2012-08-16 NOTE — Telephone Encounter (Signed)
Meds filled

## 2012-08-16 NOTE — Telephone Encounter (Signed)
Refill on Crestor 20 mg Tablet Qty 30

## 2012-08-16 NOTE — Telephone Encounter (Signed)
Refill on Vitafol-PN caplet QTY 90

## 2012-09-18 ENCOUNTER — Ambulatory Visit: Payer: Federal, State, Local not specified - PPO | Admitting: Internal Medicine

## 2012-09-19 ENCOUNTER — Telehealth: Payer: Self-pay | Admitting: Internal Medicine

## 2012-09-19 NOTE — Telephone Encounter (Signed)
Marchelle Folks calling from [facility].  Medication that was ordered is on back order and need to know if Dr. Darrick Huntsman can change to another medication.  Please call [phone]

## 2012-09-20 MED ORDER — FERROUS FUM-IRON POLYSACCH-FA 162-115.2-1 MG PO CAPS: 1.0000 | ORAL_CAPSULE | Freq: Every day | ORAL | Status: DC

## 2012-09-20 NOTE — Telephone Encounter (Signed)
Spoke with the pharmacist from [facility], she stated the Vitafol was on back order and would like to know if you would like to prescribe an alternate medication. They are not sure when this medication will be available, please advice.

## 2012-09-20 NOTE — Telephone Encounter (Signed)
Generic Tandem (ferrrous sulfate)  sent in

## 2012-09-23 ENCOUNTER — Ambulatory Visit: Payer: Federal, State, Local not specified - PPO | Admitting: Internal Medicine

## 2012-10-07 ENCOUNTER — Other Ambulatory Visit: Payer: Self-pay | Admitting: Internal Medicine

## 2012-10-14 ENCOUNTER — Other Ambulatory Visit: Payer: Self-pay | Admitting: *Deleted

## 2012-10-14 IMAGING — CR METASTATIC BONE SURVEY
1 series · 8 of 8 positions shown · non-contrast
Comparison: none

REASON FOR EXAM: Metastatic bone survey MGUS
COMMENTS:

[Series 1: w chest pa · 0.14mm/px · 8 of 21 slices shown]
[im 1/21]
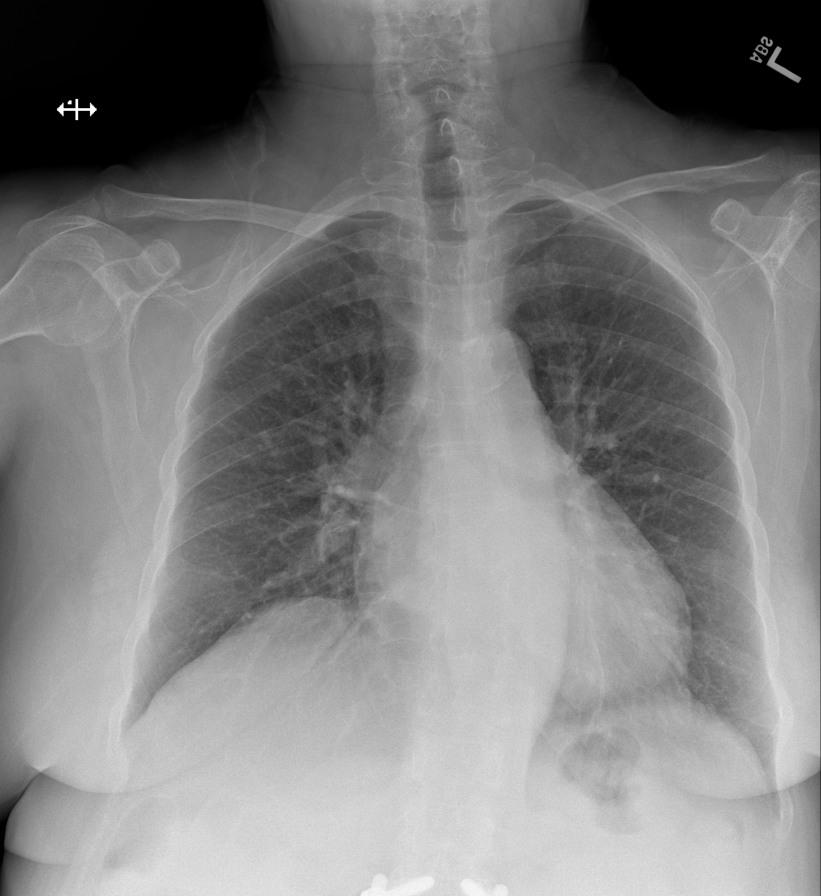
[im 3/21]
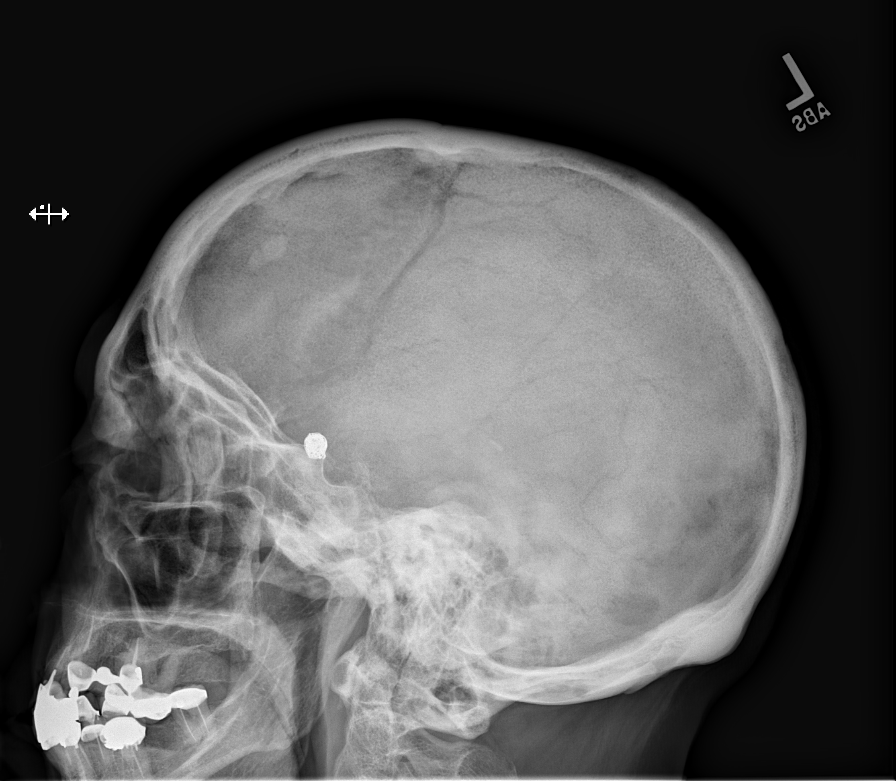
[im 6/21]
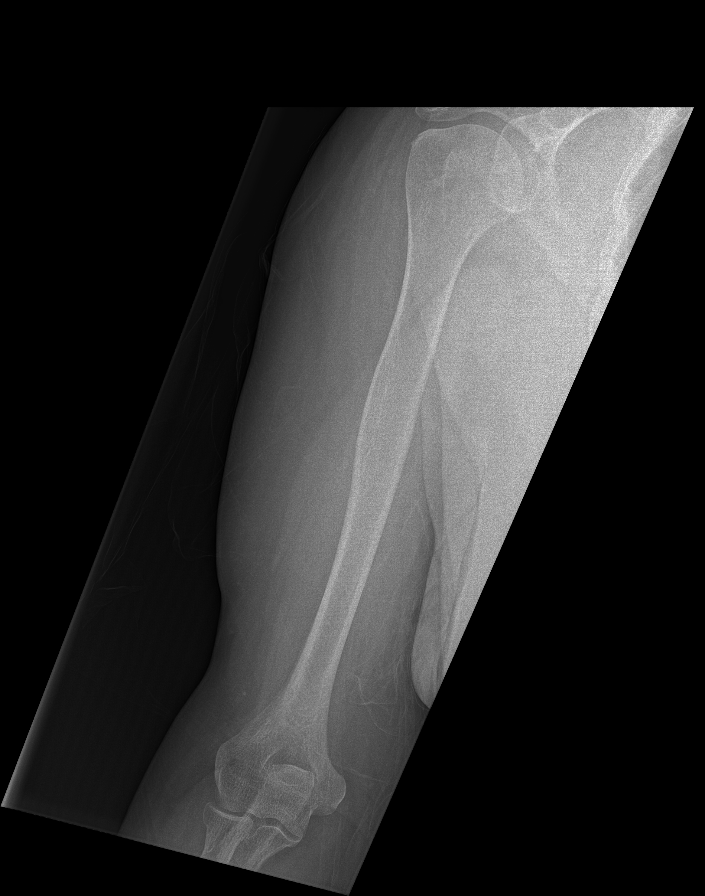
[im 9/21]
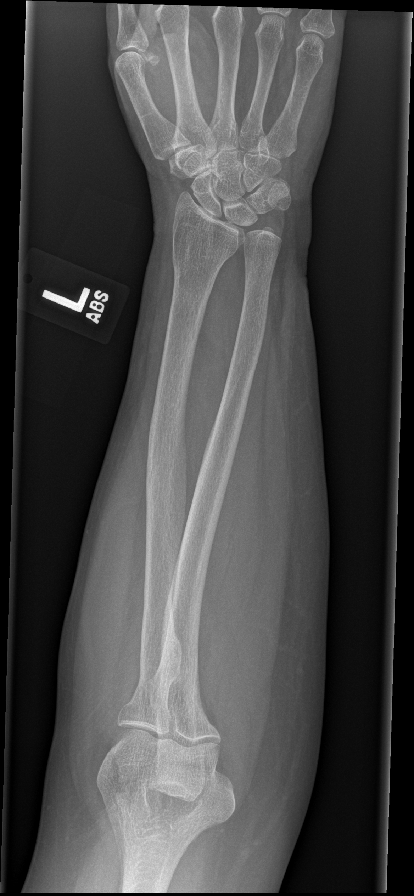
[im 12/21]
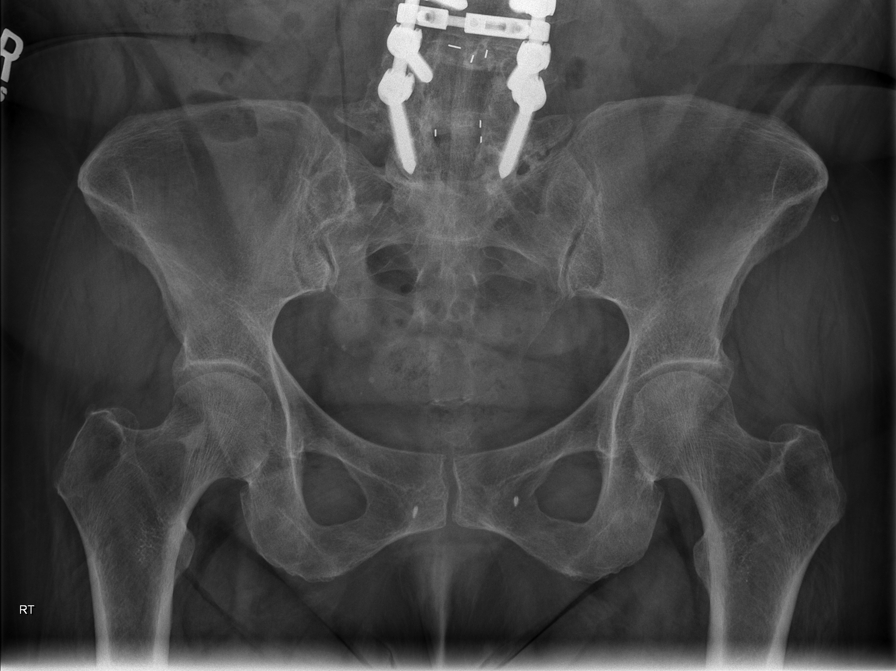
[im 15/21]
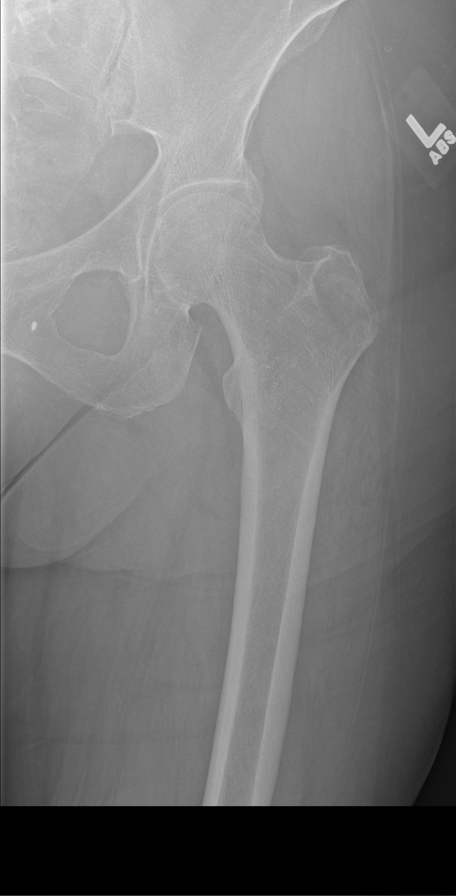
[im 18/21]
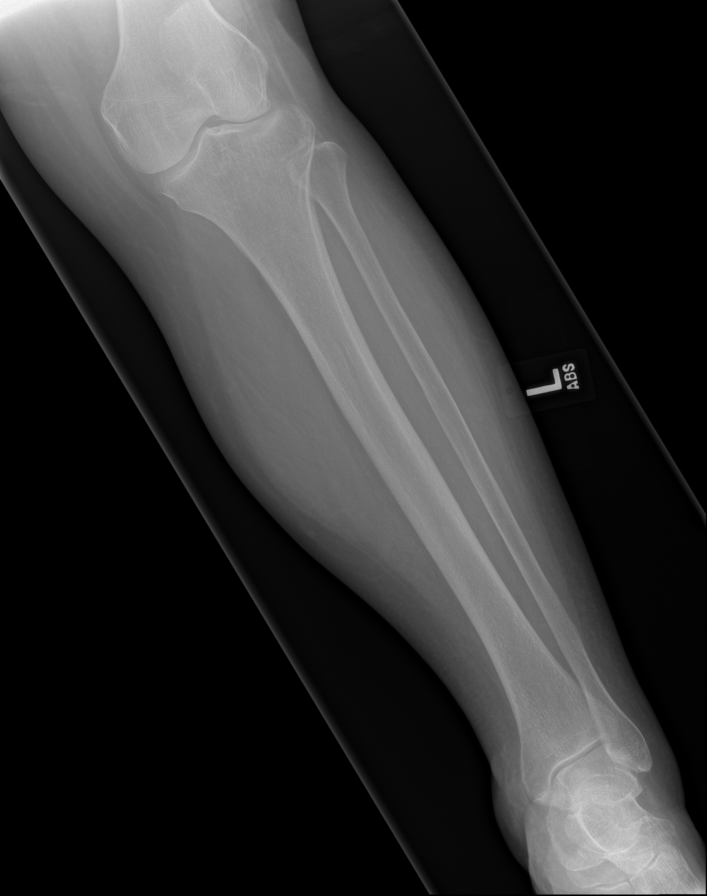
[im 21/21]
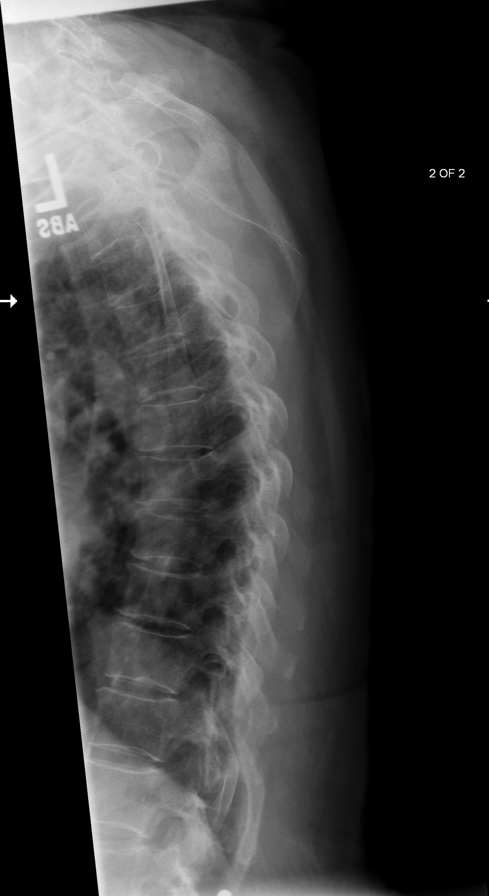

[8 of 8 positions shown; findings below may reference images not displayed]

PROCEDURE:     DXR - DXR BONE SURVEY METASTATIC  - February 05, 2012  [DATE]

RESULT:     Metastatic bone survey was performed and shows no lytic or
blastic lesions suspicious for metastatic disease. The patient has had
bilateral knee replacements and there are observed postoperative changes at
multiple levels of the lower lumbar spine.
IMPRESSION: 1. No lytic or blastic lesions suspicious for metastatic disease are
identified
2. Although not mentioned above there is an 8-mm opaque density projected
intracranially at the anterior margin of the sella turcica on the left. This
was present on a prior head CT of 10/09/2008 and presumably represents a
postsurgical change.

[REDACTED]

## 2012-10-14 MED ORDER — SYNTHROID 150 MCG PO TABS: 150.0000 ug | ORAL_TABLET | Freq: Every day | ORAL | Status: DC

## 2012-10-14 NOTE — Telephone Encounter (Signed)
Med filled.  

## 2012-10-16 ENCOUNTER — Telehealth: Payer: Self-pay | Admitting: Internal Medicine

## 2012-10-16 DIAGNOSIS — M1711 Unilateral primary osteoarthritis, right knee: Secondary | ICD-10-CM

## 2012-10-16 MED ORDER — OXYCODONE-ACETAMINOPHEN 10-325 MG PO TABS: 1.0000 | ORAL_TABLET | Freq: Four times a day (QID) | ORAL | Status: DC | PRN

## 2012-10-16 NOTE — Telephone Encounter (Signed)
Pt is needing refill on Percochet.

## 2012-10-16 NOTE — Telephone Encounter (Signed)
Ok to refill, with printed rx

## 2012-10-17 MED ORDER — OXYCODONE-ACETAMINOPHEN 10-325 MG PO TABS: 1.0000 | ORAL_TABLET | Freq: Four times a day (QID) | ORAL | Status: DC | PRN

## 2012-10-17 NOTE — Telephone Encounter (Signed)
Spoke with patient and advised rx ready for pick-up and it will be at the front desk.  

## 2012-10-19 ENCOUNTER — Ambulatory Visit: Payer: Self-pay | Admitting: Oncology

## 2012-11-14 ENCOUNTER — Other Ambulatory Visit: Payer: Self-pay | Admitting: Internal Medicine

## 2012-11-15 ENCOUNTER — Telehealth: Payer: Self-pay | Admitting: Internal Medicine

## 2012-11-15 DIAGNOSIS — M1711 Unilateral primary osteoarthritis, right knee: Secondary | ICD-10-CM

## 2012-11-15 MED ORDER — OXYCODONE-ACETAMINOPHEN 10-325 MG PO TABS: 1.0000 | ORAL_TABLET | Freq: Four times a day (QID) | ORAL | Status: DC | PRN

## 2012-11-15 NOTE — Telephone Encounter (Signed)
Pt called to get a refill on her percet

## 2012-11-15 NOTE — Telephone Encounter (Signed)
Pt notified Rx available for pick up

## 2012-11-15 NOTE — Telephone Encounter (Signed)
2 months refill on percocet printed  For march 26 and April 26 refill.

## 2012-11-17 ENCOUNTER — Other Ambulatory Visit: Payer: Self-pay | Admitting: Internal Medicine

## 2012-11-23 ENCOUNTER — Other Ambulatory Visit: Payer: Self-pay | Admitting: Internal Medicine

## 2012-11-26 ENCOUNTER — Other Ambulatory Visit: Payer: Self-pay | Admitting: Internal Medicine

## 2012-11-26 NOTE — Telephone Encounter (Signed)
Med filled.  

## 2012-11-28 ENCOUNTER — Ambulatory Visit: Payer: Self-pay | Admitting: Oncology

## 2012-12-02 ENCOUNTER — Ambulatory Visit (INDEPENDENT_AMBULATORY_CARE_PROVIDER_SITE_OTHER): Payer: Federal, State, Local not specified - PPO | Admitting: Internal Medicine

## 2012-12-02 ENCOUNTER — Encounter: Payer: Self-pay | Admitting: Internal Medicine

## 2012-12-02 VITALS — BP 138/68 | HR 74 | Temp 98.7°F | Resp 16 | Wt 186.2 lb

## 2012-12-02 DIAGNOSIS — M1711 Unilateral primary osteoarthritis, right knee: Secondary | ICD-10-CM

## 2012-12-02 DIAGNOSIS — D594 Other nonautoimmune hemolytic anemias: Secondary | ICD-10-CM

## 2012-12-02 DIAGNOSIS — D472 Monoclonal gammopathy: Secondary | ICD-10-CM

## 2012-12-02 DIAGNOSIS — M1710 Unilateral primary osteoarthritis, unspecified knee: Secondary | ICD-10-CM

## 2012-12-02 DIAGNOSIS — R7309 Other abnormal glucose: Secondary | ICD-10-CM

## 2012-12-02 DIAGNOSIS — R110 Nausea: Secondary | ICD-10-CM

## 2012-12-02 DIAGNOSIS — G8929 Other chronic pain: Secondary | ICD-10-CM

## 2012-12-02 DIAGNOSIS — R739 Hyperglycemia, unspecified: Secondary | ICD-10-CM

## 2012-12-02 DIAGNOSIS — M549 Dorsalgia, unspecified: Secondary | ICD-10-CM

## 2012-12-02 MED ORDER — DOXEPIN HCL 25 MG PO CAPS: 50.0000 mg | ORAL_CAPSULE | Freq: Every day | ORAL | Status: DC

## 2012-12-02 MED ORDER — DIAZEPAM 5 MG PO TABS: 5.0000 mg | ORAL_TABLET | Freq: Two times a day (BID) | ORAL | Status: DC | PRN

## 2012-12-02 MED ORDER — OXYCODONE-ACETAMINOPHEN 10-325 MG PO TABS: 1.0000 | ORAL_TABLET | Freq: Four times a day (QID) | ORAL | Status: DC | PRN

## 2012-12-02 MED ORDER — DRONABINOL 2.5 MG PO CAPS: 2.5000 mg | ORAL_CAPSULE | Freq: Two times a day (BID) | ORAL | Status: DC

## 2012-12-02 NOTE — Assessment & Plan Note (Signed)
Secondary to spinal stenosis, [facility].  Refill on percocet given.

## 2012-12-02 NOTE — Assessment & Plan Note (Signed)
Secondary to prolonged infection and tigacycline .  Refill Marinol

## 2012-12-02 NOTE — Assessment & Plan Note (Signed)
Stable, Followed by Dr. Orlie Dakin with serial measurements and SPEP

## 2012-12-02 NOTE — Assessment & Plan Note (Addendum)
No history of diabetes by prior A1c. She has a fasting glucose coming up with Dr. Milinda Cave visit.

## 2012-12-02 NOTE — Assessment & Plan Note (Addendum)
Secondary to chronic inflammation and adverse drug reaction.  Her hgb has recovered to 11 since stopping the antibiotic

## 2012-12-02 NOTE — Progress Notes (Signed)
Patient ID: [name], female   DOB: 1941/02/18, 72 y.o.   MRN: [id number]  Patient Active Problem List  Diagnosis  . Hypertension  . Brain aneurysm  . Chronic back pain greater than 3 months duration  . Elevated blood sugar  . Breast screening, unspecified  . Osteoarthritis of right knee  . 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 Unspecified hypothyroidism  . Obesity (BMI 30-39.9)  . Monoclonal gammopathy of undetermined significance    Subjective:  CC:   No chief complaint on file.   HPI:   [name] a 72 y.o. female who presents for  follow up on chronic conditions including spinal stenosis, hypertension, and chronic otitis media of the left ear.  She has undergone prolonged antibiotic therapy via a porta cath, placed September 2013, followed by another surgery by Dr. Lenoria Farrier in which her TM was removed for the third time accompanied by removal of debris on Feb 22.  No recurrence of infection so far. She has had multiple e adverse reactions to tigacycline including chronic nausea with weight loss, and hyperpigmented rash on the lower extremities.  She chose to stop the antibiotics  due to the side effects.  She has also been seeing ID specialist  Janetta Hora who also communicates  with [facility] MD Dr. Valentina Lucks.  She is requesting to resume doxepin for insomnia and valium prn muscle spasms, as robaxin has not been successful. Refill on percocet requested.   Past Medical History  Diagnosis Date  . Degenerative disc disease     HERNITAED DISC BY MRI ON 07/07;S/P LAMINECTOMY,RID REPLACEMENT  . Brain aneurysm     S/P RESECTION  . Discoid lupus erythematosus eyelid     NEGATIVE SEROLOGIC MARKERS  . Hypercholesterolemia   . Chronic sinusitis   . Hypertension   . Complication of anesthesia 05/30/2010    from colonoscopy-had Propofol-had a  . Complication of anesthesia     reaction-went into asthma-pneumonia,-  . Complication of anesthesia     then CHF and to ICU  . Dysrhythmia     PVC's occ.  . Pneumonia 05/30/2010    after receiving Propofol  . Headache     migraines occ.  . CHF (congestive heart failure) 05/2010    after receiving Propofol  . Hypothyroidism     takes Synthroid  . Blood transfusion 2009    back surgery  . Sleep apnea     uses CPAP-8.4  . Asthma 06/2010    depending on what around    Past Surgical History  Procedure Laterality Date  . Cosmetic surgery    . Tonsillectomy  1963  . Back surgery  1968,1988,2002,2009    4 laminectomies and fusions  . Abdominal hysterectomy  1988    uterus only  . Brain surgery  2005    coiling of cranial aneurysum  . Excision morton's neuroma  1990    left foot  . Nasal sinus surgery  1977-2008  . Lipomas  1975    3 from right breast  . Tympanic membrane repair  2009-last one    x5 times  . Total knee arthroplasty  07/10/2011    Procedure: TOTAL KNEE ARTHROPLASTY;  Surgeon: Loanne Drilling;  Location: [facility];  Service: Orthopedics;  Laterality: Right;  . Joint replacement  2013    Right knee,  Alucio       The following portions of the patient's history were reviewed and updated as appropriate: Allergies, current medications, and problem list.  Review of Systems:  Patient denies headache, fevers, malaise,  skin rash, eye pain, sinus congestion and sinus pain, sore throat, dysphagia,  hemoptysis , cough, dyspnea, wheezing, chest pain, palpitations, orthopnea, edema, abdominal pain, , melena, diarrhea, constipation, flank pain, dysuria, hematuria, urinary  Frequency, nocturia, numbness, tingling, seizures,  Focal weakness, Loss of consciousness,  Tremor, insomnia, depression, anxiety, and suicidal ideation.     History   Social History  . Marital Status: Married    Spouse Name: N/A    Number of Children: N/A  . Years of Education: N/A   Occupational History  . Not on file.   Social History Main Topics  . Smoking status: Former Smoker -- 0.50 packs/day for 40 years    Types:  Cigarettes    Quit date: 08/26/2010  . Smokeless tobacco: Never Used     Comment: REMOTELY QUIT  TOBACCO USE  . Alcohol Use: Yes     Comment: occasional  . Drug Use: No  . Sexually Active: Not on file   Other Topics Concern  . Not on file   Social History Narrative   Light exercise   LIVES WITH SPOUSE          Objective:  BP 138/68  Pulse 74  Temp(Src) 98.7 F (37.1 C) (Oral)  Resp 16  Wt 186 lb 4 oz (84.482 kg)  BMI 33.5 kg/m2  SpO2 97%  General appearance: alert, cooperative and appears stated age Ears: normal TM's and external ear canals both ears Throat: lips, mucosa, and tongue normal; teeth and gums normal Neck: no adenopathy, no carotid bruit, supple, symmetrical, trachea midline and thyroid not enlarged, symmetric, no tenderness/mass/nodules Back: symmetric, no curvature. ROM normal. No CVA tenderness. Lungs: clear to auscultation bilaterally Heart: regular rate and rhythm, S1, S2 normal, no murmur, click, rub or gallop Abdomen: soft, non-tender; bowel sounds normal; no masses,  no organomegaly Pulses: 2+ and symmetric Skin: Skin color, texture, turgor normal. No rashes or lesions Lymph nodes: Cervical, supraclavicular, and axillary nodes normal.  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  Her hgb has recovered to 11 since stopping the antibiotic   Elevated blood sugar No history of diabetes by prior A1c. She has a fasting glucose coming up with Dr. Milinda Cave visit.   Chronic back pain greater than 3 months duration Secondary to spinal stenosis, [facility].  Refill on percocet given.   Monoclonal gammopathy of undetermined significance Stable, Followed by Dr. Orlie Dakin with serial measurements and SPEP   Chronic nausea Secondary to prolonged infection and tigacycline .  Refill Marinol   Updated Medication List Outpatient Encounter Prescriptions as of  12/02/2012  Medication Sig Dispense Refill  . benzonatate (TESSALON) 100 MG capsule Take 1 capsule (100 mg total) by mouth every 4 (four) hours.  120 capsule  2  . BYSTOLIC 5 MG tablet TAKE 1 TABLET BY MOUTH EVERY DAY  30 tablet  3  . CLARINEX 5 MG tablet TAKE 1 TABLET (5 MG TOTAL) BY MOUTH EVERY MORNING.  90 tablet  3  . cyanocobalamin (,VITAMIN B-12,) 1000 MCG/ML injection Inject 1 mL (1,000 mcg total) into the muscle every 30 (thirty) days.  10 mL  3  . dronabinol (MARINOL) 2.5 MG capsule Take 1 capsule (2.5 mg total) by mouth 2 (two) times daily before a meal.  60 capsule  3  . furosemide (LASIX) 20 MG tablet Take 1-2 tablets daily as needed  for fluid retention.  180 tablet  3  . hydrALAZINE (APRESOLINE) 100 MG tablet TAKE 1 TABLET TWICE A DAY  180 tablet  3  . IMDUR 60 MG 24 hr tablet TAKE ONE BY MOUTH DAILY  30 tablet  6  . LEXAPRO 10 MG tablet TAKE 1 TABLET (10 MG TOTAL) BY MOUTH EVERY MORNING.  90 tablet  3  . LIDODERM 5 % PLACE 3 PATCHES ONTO THE SKIN DAILY. REMOVE & DISCARD PATCH WITHIN 12 HOURS OR AS DIRECTED BY MD  90 patch  3  . LOVAZA 1 G capsule TAKE 2 CAPSULES TWICE A DAY  360 capsule  6  . montelukast (SINGULAIR) 10 MG tablet Take 1 tablet (10 mg total) by mouth at bedtime.  90 tablet  3  . NEEDLE, DISP, 23 G (BD DISP NEEDLE) 23G X 1" MISC Uses to inject b12 into muscle once monthly  12 each  1  . NORVASC 10 MG tablet TAKE 1 TABLET (10 MG TOTAL) BY MOUTH DAILY.  30 tablet  5  . nystatin (MYCOSTATIN) powder Apply topically 4 (four) times daily.  15 g  0  . OxyCODONE (OXYCONTIN) 10 mg T12A Take 10 mg by mouth every 12 (twelve) hours.      Marland Kitchen oxyCODONE-acetaminophen (PERCOCET) 10-325 MG per tablet Take 1 tablet by mouth every 6 (six) hours as needed.  120 tablet  0  . Prenatal Vit-Fe Fumarate-FA (VITAFOL-PN) TABS Take one tablet daily.  90 tablet  3  . rosuvastatin (CRESTOR) 20 MG tablet Take 1 tablet (20 mg total) by mouth at bedtime.  30 tablet  3  . sodium chloride (OCEAN) 0.65 %  nasal spray Place 2 sprays into the nose as needed. Allergies        . SYNTHROID 150 MCG tablet Take 1 tablet (150 mcg total) by mouth daily.  30 tablet  5  . zolpidem (AMBIEN) 5 MG tablet Take 1 tablet (5 mg total) by mouth at bedtime as needed for sleep (BRAND NAME ONLY,  PATIENT HAD ALLERGIC REACTION TO GENERIC).  30 tablet  5  . [DISCONTINUED] dronabinol (MARINOL) 2.5 MG capsule Take 2.5 mg by mouth 2 (two) times daily before a meal.      . [DISCONTINUED] oxyCODONE-acetaminophen (PERCOCET) 10-325 MG per tablet Take 1 tablet by mouth every 6 (six) hours as needed.  120 tablet  0  . azithromycin (ZITHROMAX) 250 MG tablet Take 250 mg by mouth daily.       . budesonide-formoterol (SYMBICORT) 80-4.5 MCG/ACT inhaler Inhale 2 puffs into the lungs 2 (two) times daily.        . diazepam (VALIUM) 5 MG tablet Take 1 tablet (5 mg total) by mouth every 12 (twelve) hours as needed for anxiety.  30 tablet  1  . doxepin (SINEQUAN) 25 MG capsule Take 2 capsules (50 mg total) by mouth at bedtime.  30 capsule  5  . doxepin (SINEQUAN) 25 MG capsule Take 2 capsules (50 mg total) by mouth at bedtime.  180 capsule  3  . Ferrous Fum-Iron Polysacch-FA 162-115.2-1 MG CAPS Take 1 tablet by mouth daily.  30 each  3  . losartan (COZAAR) 50 MG tablet Take 1 tablet (50 mg total) by mouth 2 (two) times daily.  60 tablet  6  . nebivolol (BYSTOLIC) 5 MG tablet Take 1 tablet (5 mg total) by mouth daily.  30 tablet  3  . promethazine (PHENERGAN) 12.5 MG tablet Take 1 tablet (12.5 mg total) by mouth every  8 (eight) hours as needed for nausea.  30 tablet  3  . VALIUM 5 MG tablet TAKE 1 TABLET TWICE DAILY  60 tablet  3  . [DISCONTINUED] doxepin (SINEQUAN) 25 MG capsule Take 2 capsules (50 mg total) by mouth at bedtime.  180 capsule  3  . [DISCONTINUED] oxyCODONE-acetaminophen (PERCOCET) 10-325 MG per tablet Take 1 tablet by mouth every 6 (six) hours as needed.  120 tablet  0   Facility-Administered Encounter Medications as of  12/02/2012  Medication Dose Route Frequency Provider Last Rate Last Dose  . chlorhexidine (HIBICLENS) 4 % liquid 4 application  60 mL Topical Once Loanne Drilling, MD

## 2012-12-02 NOTE — Patient Instructions (Addendum)
[facility] makes a low carb tortilla that has 26 g fiber !! For your fiber needs and constipation management   Let me know if your fasting glucose is above 125 on yur next lab draw  Refills on doxepin , valium,  percocet and marinol today   Return in 3 months for annnual physical

## 2012-12-09 ENCOUNTER — Other Ambulatory Visit: Payer: Self-pay | Admitting: *Deleted

## 2012-12-09 ENCOUNTER — Encounter: Payer: Self-pay | Admitting: *Deleted

## 2012-12-09 MED ORDER — MONTELUKAST SODIUM 10 MG PO TABS: 10.0000 mg | ORAL_TABLET | Freq: Every day | ORAL | Status: DC

## 2012-12-12 LAB — BASIC METABOLIC PANEL
Anion Gap: 5 — ABNORMAL LOW (ref 7–16)
BUN: 19 mg/dL — ABNORMAL HIGH (ref 7–18)
Calcium, Total: 9.8 mg/dL (ref 8.5–10.1)
Chloride: 107 mmol/L (ref 98–107)
Glucose: 87 mg/dL (ref 65–99)
Potassium: 4.1 mmol/L (ref 3.5–5.1)

## 2012-12-12 LAB — CBC CANCER CENTER
Basophil #: 0.1 x10 3/mm (ref 0.0–0.1)
Eosinophil %: 2 %
HGB: 11.5 g/dL — ABNORMAL LOW (ref 12.0–16.0)
Lymphocyte #: 4 x10 3/mm — ABNORMAL HIGH (ref 1.0–3.6)
MCV: 99 fL (ref 80–100)
Monocyte %: 7.4 %
Neutrophil #: 4 x10 3/mm (ref 1.4–6.5)
Neutrophil %: 44.8 %
Platelet: 179 x10 3/mm (ref 150–440)
WBC: 8.9 x10 3/mm (ref 3.6–11.0)

## 2012-12-13 LAB — KAPPA/LAMBDA FREE LIGHT CHAINS (ARMC)

## 2012-12-13 LAB — URINE IEP, RANDOM

## 2012-12-19 ENCOUNTER — Ambulatory Visit: Payer: Self-pay | Admitting: Oncology

## 2013-01-17 ENCOUNTER — Other Ambulatory Visit: Payer: Self-pay | Admitting: Internal Medicine

## 2013-01-17 NOTE — Telephone Encounter (Signed)
Okay to refill? 

## 2013-01-17 NOTE — Telephone Encounter (Signed)
Ok to refill,  Authorized in epic 

## 2013-01-17 NOTE — Telephone Encounter (Signed)
Pharmacy called requesting refill on ambien. Last OV 12/02/12

## 2013-01-19 ENCOUNTER — Ambulatory Visit: Payer: Self-pay | Admitting: Oncology

## 2013-01-21 ENCOUNTER — Other Ambulatory Visit: Payer: Self-pay | Admitting: Internal Medicine

## 2013-01-27 DIAGNOSIS — J45909 Unspecified asthma, uncomplicated: Secondary | ICD-10-CM | POA: Insufficient documentation

## 2013-01-29 ENCOUNTER — Other Ambulatory Visit: Payer: Self-pay | Admitting: *Deleted

## 2013-01-30 MED ORDER — DIAZEPAM 5 MG PO TABS: ORAL_TABLET | ORAL | Status: DC

## 2013-01-30 NOTE — Telephone Encounter (Signed)
Refill faxed

## 2013-02-05 ENCOUNTER — Other Ambulatory Visit: Payer: Self-pay | Admitting: Internal Medicine

## 2013-02-16 ENCOUNTER — Other Ambulatory Visit: Payer: Self-pay | Admitting: Internal Medicine

## 2013-02-18 ENCOUNTER — Other Ambulatory Visit: Payer: Self-pay | Admitting: Internal Medicine

## 2013-02-18 NOTE — Telephone Encounter (Signed)
Script faxed.

## 2013-02-23 ENCOUNTER — Other Ambulatory Visit: Payer: Self-pay | Admitting: Internal Medicine

## 2013-03-05 ENCOUNTER — Other Ambulatory Visit: Payer: Self-pay | Admitting: Internal Medicine

## 2013-03-05 ENCOUNTER — Encounter: Payer: Federal, State, Local not specified - PPO | Admitting: Internal Medicine

## 2013-03-05 DIAGNOSIS — Z0279 Encounter for issue of other medical certificate: Secondary | ICD-10-CM

## 2013-03-13 ENCOUNTER — Encounter: Payer: Self-pay | Admitting: Internal Medicine

## 2013-03-13 DIAGNOSIS — F409 Phobic anxiety disorder, unspecified: Secondary | ICD-10-CM | POA: Insufficient documentation

## 2013-03-13 DIAGNOSIS — R05 Cough: Secondary | ICD-10-CM | POA: Insufficient documentation

## 2013-03-13 DIAGNOSIS — K5900 Constipation, unspecified: Secondary | ICD-10-CM | POA: Insufficient documentation

## 2013-03-13 DIAGNOSIS — G4701 Insomnia due to medical condition: Secondary | ICD-10-CM | POA: Insufficient documentation

## 2013-03-13 DIAGNOSIS — G8929 Other chronic pain: Secondary | ICD-10-CM

## 2013-03-13 DIAGNOSIS — J309 Allergic rhinitis, unspecified: Secondary | ICD-10-CM | POA: Insufficient documentation

## 2013-03-15 ENCOUNTER — Other Ambulatory Visit: Payer: Self-pay | Admitting: Internal Medicine

## 2013-03-17 ENCOUNTER — Other Ambulatory Visit: Payer: Self-pay | Admitting: Internal Medicine

## 2013-03-18 NOTE — Telephone Encounter (Signed)
Refill completed 03/15/13, receipt confirmed by pharmacy.

## 2013-03-21 ENCOUNTER — Other Ambulatory Visit: Payer: Self-pay | Admitting: Internal Medicine

## 2013-03-21 NOTE — Telephone Encounter (Signed)
Last visit 12/02/12. Refill?

## 2013-03-24 ENCOUNTER — Other Ambulatory Visit: Payer: Self-pay | Admitting: Internal Medicine

## 2013-03-24 ENCOUNTER — Ambulatory Visit: Payer: Self-pay | Admitting: Ophthalmology

## 2013-03-24 LAB — POTASSIUM: Potassium: 4.1 mmol/L (ref 3.5–5.1)

## 2013-03-27 ENCOUNTER — Ambulatory Visit (INDEPENDENT_AMBULATORY_CARE_PROVIDER_SITE_OTHER): Payer: Federal, State, Local not specified - PPO | Admitting: Internal Medicine

## 2013-03-27 ENCOUNTER — Encounter: Payer: Self-pay | Admitting: Internal Medicine

## 2013-03-27 VITALS — BP 134/48 | HR 82 | Temp 99.2°F | Resp 14 | Ht 63.0 in | Wt 184.5 lb

## 2013-03-27 DIAGNOSIS — Z1211 Encounter for screening for malignant neoplasm of colon: Secondary | ICD-10-CM

## 2013-03-27 DIAGNOSIS — Z9989 Dependence on other enabling machines and devices: Secondary | ICD-10-CM

## 2013-03-27 DIAGNOSIS — Z01419 Encounter for gynecological examination (general) (routine) without abnormal findings: Secondary | ICD-10-CM

## 2013-03-27 DIAGNOSIS — D472 Monoclonal gammopathy: Secondary | ICD-10-CM

## 2013-03-27 DIAGNOSIS — M1710 Unilateral primary osteoarthritis, unspecified knee: Secondary | ICD-10-CM

## 2013-03-27 DIAGNOSIS — R233 Spontaneous ecchymoses: Secondary | ICD-10-CM

## 2013-03-27 DIAGNOSIS — E039 Hypothyroidism, unspecified: Secondary | ICD-10-CM

## 2013-03-27 DIAGNOSIS — Z01818 Encounter for other preprocedural examination: Secondary | ICD-10-CM

## 2013-03-27 DIAGNOSIS — G4733 Obstructive sleep apnea (adult) (pediatric): Secondary | ICD-10-CM

## 2013-03-27 DIAGNOSIS — I10 Essential (primary) hypertension: Secondary | ICD-10-CM

## 2013-03-27 DIAGNOSIS — IMO0002 Reserved for concepts with insufficient information to code with codable children: Secondary | ICD-10-CM

## 2013-03-27 DIAGNOSIS — Z0000 Encounter for general adult medical examination without abnormal findings: Secondary | ICD-10-CM

## 2013-03-27 DIAGNOSIS — E559 Vitamin D deficiency, unspecified: Secondary | ICD-10-CM

## 2013-03-27 DIAGNOSIS — E1310 Other specified diabetes mellitus with ketoacidosis without coma: Secondary | ICD-10-CM

## 2013-03-27 DIAGNOSIS — N183 Chronic kidney disease, stage 3 unspecified: Secondary | ICD-10-CM

## 2013-03-27 DIAGNOSIS — M1711 Unilateral primary osteoarthritis, right knee: Secondary | ICD-10-CM

## 2013-03-27 DIAGNOSIS — E785 Hyperlipidemia, unspecified: Secondary | ICD-10-CM

## 2013-03-27 DIAGNOSIS — E1110 Type 2 diabetes mellitus with ketoacidosis without coma: Secondary | ICD-10-CM

## 2013-03-27 LAB — COMPREHENSIVE METABOLIC PANEL
ALT: 15 U/L (ref 0–35)
AST: 24 U/L (ref 0–37)
Calcium: 10.2 mg/dL (ref 8.4–10.5)
Chloride: 107 mEq/L (ref 96–112)
Creatinine, Ser: 1.4 mg/dL — ABNORMAL HIGH (ref 0.4–1.2)
Potassium: 3.8 mEq/L (ref 3.5–5.1)
Sodium: 141 mEq/L (ref 135–145)

## 2013-03-27 LAB — CBC WITH DIFFERENTIAL/PLATELET
Basophils Relative: 0.4 % (ref 0.0–3.0)
Eosinophils Relative: 2.4 % (ref 0.0–5.0)
HCT: 37.3 % (ref 36.0–46.0)
MCV: 97.4 fl (ref 78.0–100.0)
Monocytes Absolute: 0.7 10*3/uL (ref 0.1–1.0)
Monocytes Relative: 9.9 % (ref 3.0–12.0)
Neutrophils Relative %: 51.3 % (ref 43.0–77.0)
RBC: 3.83 Mil/uL — ABNORMAL LOW (ref 3.87–5.11)
WBC: 6.8 10*3/uL (ref 4.5–10.5)

## 2013-03-27 LAB — LIPID PANEL
Cholesterol: 159 mg/dL (ref 0–200)
HDL: 45.4 mg/dL (ref >39.00)
LDL Cholesterol: 85 mg/dL (ref 0–99)
VLDL: 28.2 mg/dL (ref 0.0–40.0)

## 2013-03-27 LAB — TSH: TSH: 0.48 u[IU]/mL (ref 0.35–5.50)

## 2013-03-27 LAB — HM PAP SMEAR

## 2013-03-27 MED ORDER — OXYCODONE-ACETAMINOPHEN 10-325 MG PO TABS: 1.0000 | ORAL_TABLET | Freq: Four times a day (QID) | ORAL | Status: DC | PRN

## 2013-03-27 NOTE — Assessment & Plan Note (Signed)
Surveillance by Orlie Dakin ever 6 montsh

## 2013-03-27 NOTE — Progress Notes (Signed)
Patient ID: [name], female   DOB: [birth year], 72 y.o.   MRN: [id number]  The patient is here for annual Medicare wellness examination and management of other chronic and acute problems.  Has been having RLQ stabbing pain,  Thinks it may be the mesh from her prior gyn surgery   TAH 1988. Has had urology evaluation with [facility] which was normal.  She is having bunion surgery and cataract surgery in the near future  She is wearing her CPAP with supplemental 02 nightly and needs renewal.   Has finished a protracted course of IV abx therapy for mastoiditis directed by [facility],  Going to have her porta cath removed in a few weeks.  needs medical clearance and prophylactic avelox for bunion surgery prior to dental cleanings due to knee prosthesis     The risk factors are reflected in the social history.  The roster of all physicians providing medical care to patient - is listed in the Snapshot section of the chart.  Activities of daily living:  The patient is 100% independent in all ADLs: dressing, toileting, feeding as well as independent mobility  Home safety : The patient has smoke detectors in the home. They wear seatbelts.  There are no firearms at home. There is no violence in the home.   There is no risks for hepatitis, STDs or HIV. There is no   history of blood transfusion. They have no travel history to infectious disease endemic areas of the world.  The patient has seen their dentist in the last six month. They have seen their eye doctor in the last year. 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   They do not  have excessive sun exposure. Discussed the need for sun protection: hats, long sleeves and use of sunscreen if there is significant sun exposure.   Diet: the importance of a healthy diet is discussed. They do have a healthy diet.  The benefits of regular aerobic exercise were discussed. She walks 4 times per week ,  20 minutes.   Depression screen: there are no  signs or vegative symptoms of depression- irritability, change in appetite, anhedonia, sadness/tearfullness.  Cognitive assessment: the patient manages all their financial and personal affairs and is actively engaged. They could relate day,date,year and events; recalled 2/3 objects at 3 minutes; performed clock-face test normally.  The following portions of the patient's history were reviewed and updated as appropriate: allergies, current medications, past family history, past medical history,  past surgical history, past social history  and problem list.  Visual acuity was not assessed per patient preference since she has regular follow up with her ophthalmologist. Hearing and body mass index were assessed and reviewed.   During the course of the visit the patient was educated and counseled about appropriate screening and preventive services including : fall prevention , diabetes screening, nutrition counseling, colorectal cancer screening, and recommended immunizations.    Objective:  BP 134/48  Pulse 82  Temp(Src) 99.2 F (37.3 C) (Oral)  Resp 14  Ht 5\' 3"  (1.6 m)  Wt 184 lb 8 oz (83.689 kg)  BMI 32.69 kg/m2  SpO2 98%  General Appearance:    Alert, cooperative, no distress, appears stated age  Head:    Normocephalic, without obvious abnormality, atraumatic  Eyes:    PERRL, conjunctiva/corneas clear, EOM's intact, fundi    benign, both eyes  Ears:    Normal TM's and external ear canals, both ears  Nose:  Nares normal, septum midline, mucosa normal, no drainage    or sinus tenderness  Throat:   Lips, mucosa, and tongue normal; teeth and gums normal  Neck:   Supple, symmetrical, trachea midline, no adenopathy;    thyroid:  no enlargement/tenderness/nodules; no carotid   bruit or JVD  Back:     Symmetric, no curvature, ROM normal, no CVA tenderness  Lungs:     Clear to auscultation bilaterally, respirations unlabored  Chest Wall:    No tenderness or deformity   Heart:    Regular  rate and rhythm, S1 and S2 normal, no murmur, rub   or gallop  Breast Exam:    No tenderness, masses, or nipple abnormality  Abdomen:     Soft, non-tender, bowel sounds active all four quadrants,    no masses, no organomegaly  Genitalia:    Pelvic: cervix and uterus absent ;, no adnexal masses or tenderness, , rectovaginal septum normal, and vagina normal without discharge  Extremities:   Extremities normal, atraumatic, no cyanosis or edema  Pulses:   2+ and symmetric all extremities  Skin:   Diffuse staining /discoloration of lower extremities/venous stasis vs drug effect     Lymph nodes:   Cervical, supraclavicular, and axillary nodes normal  Neurologic:   CNII-XII intact except for hearing , normal strength, sensation and reflexes   throughout   Assessment and Plan:  OSA on CPAP Patient uses CPAP and supplemental 02 ever since her hospitalization in 2011 for hypoxic respiratory failure.   Monoclonal gammopathy of undetermined significance Surveillance by Orlie Dakin ever 6 montsh   Unspecified hypothyroidism TSH 0.45 on current dose,  No changes today  Routine general medical examination at a health care facility Annual comprehensive exam was done including breast, pelvic and PAP smear. All screenings have been addressed . She is due for mammogram but needs to wait until port cath has been removed.,  Will schedule in October.   Petechial rash Cbc is normal,  No history of tick bites.   Hypertension Well controlled on current regimen. Renal function is slightl off,  Will repeat in one week  no changes today.  Other and unspecified hyperlipidemia Well controlled on current regimen of crestor and fish oil. heaptic enzymes are normal ,  No changes today.  Preoperative clearance Preoperative medical clearance, requested by her podiatrist , for bunionectomy .She has DM Type 2 which has been historically well controlled  and not complicated by nephropathy , neuropathy , or PAD.  She is  due for follow up labs.    Updated Medication List Outpatient Encounter Prescriptions as of 03/27/2013  Medication Sig Dispense Refill  . AMBIEN 5 MG tablet TAKE 1 TABLET AT BEDTIME  30 tablet  5  . BYSTOLIC 5 MG tablet TAKE 1 TABLET BY MOUTH EVERY DAY  30 tablet  3  . CLARINEX 5 MG tablet TAKE 1 TABLET (5 MG TOTAL) BY MOUTH EVERY MORNING.  90 tablet  3  . CRESTOR 20 MG tablet TAKE 1 TABLET (20 MG TOTAL) BY MOUTH AT BEDTIME.  30 tablet  3  . cyanocobalamin (,VITAMIN B-12,) 1000 MCG/ML injection Inject 1 mL (1,000 mcg total) into the muscle every 30 (thirty) days.  10 mL  3  . doxepin (SINEQUAN) 25 MG capsule Take 2 capsules (50 mg total) by mouth at bedtime.  30 capsule  5  . doxepin (SINEQUAN) 25 MG capsule Take 2 capsules (50 mg total) by mouth at bedtime.  180 capsule  3  .  FeFum-FePo-FA-B Cmp-C-Zn-Mn-Cu (SE-TAN PLUS) 162-115.2-1 MG CAPS TAKE 1 TABLET BY MOUTH DAILY.  30 capsule  5  . IMDUR 60 MG 24 hr tablet TAKE ONE BY MOUTH DAILY  30 tablet  6  . LASIX 20 MG tablet TAKE 1 TO 2 TABLETS DAILY AS NEEDED FOR FLUID RETENTION  180 tablet  1  . LEXAPRO 10 MG tablet TAKE 1 TABLET (10 MG TOTAL) BY MOUTH EVERY MORNING.  90 tablet  3  . LEXAPRO 10 MG tablet TAKE 1 TABLET (10 MG TOTAL) BY MOUTH EVERY MORNING.  90 tablet  0  . LIDODERM 5 % PLACE 3 PATCHES ONTO THE SKIN DAILY. REMOVE & DISCARD PATCH WITHIN 12 HOURS OR AS DIRECTED BY MD  90 patch  3  . LOVAZA 1 G capsule TAKE 2 CAPSULES TWICE A DAY  360 capsule  3  . montelukast (SINGULAIR) 10 MG tablet Take 1 tablet (10 mg total) by mouth at bedtime.  90 tablet  1  . NEEDLE, DISP, 23 G (BD DISP NEEDLE) 23G X 1" MISC Uses to inject b12 into muscle once monthly  12 each  1  . NORVASC 10 MG tablet TAKE 1 TABLET (10 MG TOTAL) BY MOUTH DAILY.  30 tablet  5  . oxyCODONE-acetaminophen (PERCOCET) 10-325 MG per tablet Take 1 tablet by mouth every 6 (six) hours as needed.  120 tablet  0  . sodium chloride (OCEAN) 0.65 % nasal spray Place 2 sprays into the nose  as needed. Allergies        . SYNTHROID 150 MCG tablet TAKE 1 TABLET (150 MCG TOTAL) BY MOUTH DAILY.  90 tablet  3  . [DISCONTINUED] oxyCODONE-acetaminophen (PERCOCET) 10-325 MG per tablet Take 1 tablet by mouth every 6 (six) hours as needed.  120 tablet  0  . [DISCONTINUED] SYNTHROID 150 MCG tablet TAKE 1 TABLET (150 MCG TOTAL) BY MOUTH DAILY.  30 tablet  5  . azithromycin (ZITHROMAX) 250 MG tablet Take 250 mg by mouth daily.       . benzonatate (TESSALON) 100 MG capsule TAKE 1 CAPSULE (100 MG TOTAL) BY MOUTH EVERY 4 (FOUR) HOURS.  120 capsule  2  . budesonide-formoterol (SYMBICORT) 80-4.5 MCG/ACT inhaler Inhale 2 puffs into the lungs 2 (two) times daily.        . diazepam (VALIUM) 5 MG tablet Take 1 tablet (5 mg total) by mouth every 12 (twelve) hours as needed for anxiety.  30 tablet  1  . diazepam (VALIUM) 5 MG tablet TAKE 1 TABLET TWICE DAILY  60 tablet  3  . dronabinol (MARINOL) 2.5 MG capsule Take 1 capsule (2.5 mg total) by mouth 2 (two) times daily before a meal.  60 capsule  3  . hydrALAZINE (APRESOLINE) 100 MG tablet TAKE 1 TABLET TWICE A DAY  180 tablet  5  . losartan (COZAAR) 50 MG tablet Take 1 tablet (50 mg total) by mouth 2 (two) times daily.  60 tablet  6  . nebivolol (BYSTOLIC) 5 MG tablet Take 1 tablet (5 mg total) by mouth daily.  30 tablet  3  . nystatin (MYCOSTATIN) powder Apply topically 4 (four) times daily.  15 g  0  . OxyCODONE (OXYCONTIN) 10 mg T12A Take 10 mg by mouth every 12 (twelve) hours.      . Prenatal Vit-Fe Fumarate-FA (VITAFOL-PN) TABS Take one tablet daily.  90 tablet  3  . promethazine (PHENERGAN) 12.5 MG tablet Take 1 tablet (12.5 mg total) by mouth every 8 (eight) hours  as needed for nausea.  30 tablet  3   Facility-Administered Encounter Medications as of 03/27/2013  Medication Dose Route Frequency Provider Last Rate Last Dose  . chlorhexidine (HIBICLENS) 4 % liquid 4 application  60 mL Topical Once Loanne Drilling, MD

## 2013-03-27 NOTE — Assessment & Plan Note (Signed)
Patient uses CPAP and supplemental 02 ever since her hospitalization in 2011 for hypoxic respiratory failure.

## 2013-03-27 NOTE — Patient Instructions (Addendum)
You had your annual Medicare wellness exam today  We will schedule your mammogram  And vascular evaluation in October at [facility] and [facility]   Please use the stool kit to send [place] back a sample to test for blood.  This is your colon CA screening test.   We will contact you with the bloodwork resultsba

## 2013-03-28 DIAGNOSIS — Z01818 Encounter for other preprocedural examination: Secondary | ICD-10-CM | POA: Insufficient documentation

## 2013-03-28 DIAGNOSIS — E7800 Pure hypercholesterolemia, unspecified: Secondary | ICD-10-CM | POA: Insufficient documentation

## 2013-03-28 MED ORDER — SYNTHROID 150 MCG PO TABS: ORAL_TABLET | ORAL | Status: DC

## 2013-03-28 NOTE — Assessment & Plan Note (Signed)
TSH 0.45 on current dose,  No changes today

## 2013-03-28 NOTE — Assessment & Plan Note (Signed)
Preoperative medical clearance, requested by her podiatrist , for bunionectomy .She has DM Type 2 which has been historically well controlled  and not complicated by nephropathy , neuropathy , or PAD.  She is due for follow up labs.

## 2013-03-28 NOTE — Assessment & Plan Note (Signed)
Annual comprehensive exam was done including breast, pelvic and PAP smear. All screenings have been addressed . She is due for mammogram but needs to wait until port cath has been removed.,  Will schedule in October.

## 2013-03-28 NOTE — Assessment & Plan Note (Signed)
Well controlled on current regimen of crestor and fish oil. heaptic enzymes are normal ,  No changes today.

## 2013-03-28 NOTE — Assessment & Plan Note (Signed)
Cbc is normal,  No history of tick bites.

## 2013-03-28 NOTE — Assessment & Plan Note (Signed)
Well controlled on current regimen. Renal function is slightl off,  Will repeat in one week  no changes today.

## 2013-03-31 ENCOUNTER — Ambulatory Visit: Payer: Self-pay | Admitting: Ophthalmology

## 2013-04-04 ENCOUNTER — Other Ambulatory Visit (INDEPENDENT_AMBULATORY_CARE_PROVIDER_SITE_OTHER): Payer: Federal, State, Local not specified - PPO

## 2013-04-04 DIAGNOSIS — E1310 Other specified diabetes mellitus with ketoacidosis without coma: Secondary | ICD-10-CM

## 2013-04-04 DIAGNOSIS — N183 Chronic kidney disease, stage 3 unspecified: Secondary | ICD-10-CM

## 2013-04-04 DIAGNOSIS — E1110 Type 2 diabetes mellitus with ketoacidosis without coma: Secondary | ICD-10-CM

## 2013-04-04 LAB — BASIC METABOLIC PANEL
BUN: 17 mg/dL (ref 6–23)
Calcium: 9.8 mg/dL (ref 8.4–10.5)
Creatinine, Ser: 1 mg/dL (ref 0.4–1.2)
GFR: 55.65 mL/min — ABNORMAL LOW (ref >60.00)
Glucose, Bld: 74 mg/dL (ref 70–99)
Potassium: 4.3 mEq/L (ref 3.5–5.1)

## 2013-04-04 LAB — HEMOGLOBIN A1C: Hgb A1c MFr Bld: 4.5 % — ABNORMAL LOW (ref 4.6–6.5)

## 2013-04-06 ENCOUNTER — Encounter: Payer: Self-pay | Admitting: Internal Medicine

## 2013-04-08 ENCOUNTER — Encounter: Payer: Self-pay | Admitting: Internal Medicine

## 2013-04-08 NOTE — Telephone Encounter (Signed)
Mailed unread message to pt  

## 2013-04-23 ENCOUNTER — Ambulatory Visit: Payer: Self-pay | Admitting: Ophthalmology

## 2013-04-23 LAB — POTASSIUM: Potassium: 3.9 mmol/L (ref 3.5–5.1)

## 2013-04-28 ENCOUNTER — Ambulatory Visit: Payer: Self-pay | Admitting: Ophthalmology

## 2013-05-16 ENCOUNTER — Encounter (HOSPITAL_BASED_OUTPATIENT_CLINIC_OR_DEPARTMENT_OTHER): Payer: Self-pay | Admitting: *Deleted

## 2013-05-16 NOTE — Progress Notes (Signed)
Pt to come in for bmet-ekg-will try to get ekg unc- She has had a complicated hx-but resp stable and cardiac stable-does not have to see cardiology- Had reaction to diprivan-caused resp distress and chf- Has had multiple surgeries with no other problems

## 2013-05-19 ENCOUNTER — Telehealth: Payer: Self-pay | Admitting: Internal Medicine

## 2013-05-19 ENCOUNTER — Encounter (HOSPITAL_BASED_OUTPATIENT_CLINIC_OR_DEPARTMENT_OTHER)
Admission: RE | Admit: 2013-05-19 | Discharge: 2013-05-19 | Disposition: A | Discharge status: Home / Self Care | Payer: Medicare Other | Source: Ambulatory Visit | Attending: Orthopedic Surgery | Admitting: Orthopedic Surgery

## 2013-05-19 DIAGNOSIS — M1711 Unilateral primary osteoarthritis, right knee: Secondary | ICD-10-CM

## 2013-05-19 LAB — BASIC METABOLIC PANEL
BUN: 21 mg/dL (ref 6–23)
Calcium: 10.4 mg/dL (ref 8.4–10.5)
Creatinine, Ser: 0.97 mg/dL (ref 0.50–1.10)
GFR calc Af Amer: 67 mL/min — ABNORMAL LOW (ref >90)
GFR calc non Af Amer: 58 mL/min — ABNORMAL LOW (ref >90)
Glucose, Bld: 89 mg/dL (ref 70–99)
Potassium: 4.3 mEq/L (ref 3.5–5.1)
Sodium: 140 mEq/L (ref 135–145)

## 2013-05-19 MED ORDER — OXYCODONE-ACETAMINOPHEN 10-325 MG PO TABS: 1.0000 | ORAL_TABLET | Freq: Four times a day (QID) | ORAL | Status: DC | PRN

## 2013-05-19 NOTE — Telephone Encounter (Signed)
Done, given to Robin 

## 2013-05-19 NOTE — Telephone Encounter (Signed)
Pt is needing refill on Percocet and was wondering if she could pick it up this afternoon on her way to her pre-op visit

## 2013-05-21 ENCOUNTER — Other Ambulatory Visit: Payer: Self-pay | Admitting: Orthopedic Surgery

## 2013-05-22 ENCOUNTER — Ambulatory Visit (HOSPITAL_BASED_OUTPATIENT_CLINIC_OR_DEPARTMENT_OTHER)
Admission: RE | Admit: 2013-05-22 | Discharge: 2013-05-22 | Disposition: A | Discharge status: Home / Self Care | Payer: Federal, State, Local not specified - PPO | Source: Ambulatory Visit | Attending: Orthopedic Surgery | Admitting: Orthopedic Surgery

## 2013-05-22 ENCOUNTER — Ambulatory Visit (HOSPITAL_BASED_OUTPATIENT_CLINIC_OR_DEPARTMENT_OTHER): Payer: Federal, State, Local not specified - PPO | Admitting: Certified Registered Nurse Anesthetist

## 2013-05-22 ENCOUNTER — Encounter (HOSPITAL_BASED_OUTPATIENT_CLINIC_OR_DEPARTMENT_OTHER): Payer: Self-pay | Admitting: Certified Registered Nurse Anesthetist

## 2013-05-22 ENCOUNTER — Encounter (HOSPITAL_BASED_OUTPATIENT_CLINIC_OR_DEPARTMENT_OTHER)
Admission: RE | Disposition: A | Discharge status: Home / Self Care | Payer: Self-pay | Source: Ambulatory Visit | Attending: Orthopedic Surgery

## 2013-05-22 ENCOUNTER — Telehealth: Payer: Self-pay | Admitting: Internal Medicine

## 2013-05-22 DIAGNOSIS — M2020 Hallux rigidus, unspecified foot: Secondary | ICD-10-CM | POA: Insufficient documentation

## 2013-05-22 DIAGNOSIS — M2021 Hallux rigidus, right foot: Secondary | ICD-10-CM

## 2013-05-22 DIAGNOSIS — M2010 Hallux valgus (acquired), unspecified foot: Secondary | ICD-10-CM | POA: Insufficient documentation

## 2013-05-22 HISTORY — PX: FOOT ARTHRODESIS: SHX1655

## 2013-05-22 SURGERY — FUSION, JOINT, FOOT
Anesthesia: General | Site: Foot | Laterality: Right | Wound class: Clean

## 2013-05-22 MED ORDER — MIDAZOLAM HCL 5 MG/5ML IJ SOLN
INTRAMUSCULAR | Status: DC | PRN
  Administered 2013-05-22: 1 mg via INTRAVENOUS

## 2013-05-22 MED ORDER — PROPOFOL 10 MG/ML IV BOLUS
INTRAVENOUS | Status: DC | PRN
  Administered 2013-05-22: 200 mg via INTRAVENOUS

## 2013-05-22 MED ORDER — FENTANYL CITRATE 0.05 MG/ML IJ SOLN
50.0000 ug | INTRAMUSCULAR | Status: DC | PRN
  Administered 2013-05-22: 100 ug via INTRAVENOUS

## 2013-05-22 MED ORDER — MIDAZOLAM HCL 2 MG/2ML IJ SOLN
1.0000 mg | INTRAMUSCULAR | Status: DC | PRN
  Administered 2013-05-22: 2 mg via INTRAVENOUS

## 2013-05-22 MED ORDER — FENTANYL CITRATE 0.05 MG/ML IJ SOLN
INTRAMUSCULAR | Status: DC | PRN
  Administered 2013-05-22: 25 ug via INTRAVENOUS

## 2013-05-22 MED ORDER — LACTATED RINGERS IV SOLN
INTRAVENOUS | Status: DC
  Administered 2013-05-22 (×2): via INTRAVENOUS

## 2013-05-22 MED ORDER — OXYCODONE HCL 5 MG PO TABS: 5.0000 mg | ORAL_TABLET | ORAL | Status: DC | PRN

## 2013-05-22 MED ORDER — EPHEDRINE SULFATE 50 MG/ML IJ SOLN
INTRAMUSCULAR | Status: DC | PRN
  Administered 2013-05-22: 10 mg via INTRAVENOUS

## 2013-05-22 MED ORDER — ACETAMINOPHEN 500 MG PO TABS
1000.0000 mg | ORAL_TABLET | Freq: Once | ORAL | Status: AC
  Administered 2013-05-22: 500 mg via ORAL

## 2013-05-22 MED ORDER — BACITRACIN ZINC 500 UNIT/GM EX OINT
TOPICAL_OINTMENT | CUTANEOUS | Status: DC | PRN
  Administered 2013-05-22: 1 via TOPICAL

## 2013-05-22 MED ORDER — SODIUM CHLORIDE 0.9 % IV SOLN: INTRAVENOUS | Status: DC

## 2013-05-22 MED ORDER — ONDANSETRON HCL 4 MG/2ML IJ SOLN
INTRAMUSCULAR | Status: DC | PRN
  Administered 2013-05-22: 4 mg via INTRAVENOUS

## 2013-05-22 MED ORDER — HYDROMORPHONE HCL PF 1 MG/ML IJ SOLN
0.2500 mg | INTRAMUSCULAR | Status: DC | PRN
  Administered 2013-05-22 (×3): 0.5 mg via INTRAVENOUS

## 2013-05-22 MED ORDER — BUPIVACAINE-EPINEPHRINE PF 0.5-1:200000 % IJ SOLN
INTRAMUSCULAR | Status: DC | PRN
  Administered 2013-05-22: 25 mL

## 2013-05-22 MED ORDER — DEXAMETHASONE SODIUM PHOSPHATE 10 MG/ML IJ SOLN
INTRAMUSCULAR | Status: DC | PRN
  Administered 2013-05-22: 10 mg via INTRAVENOUS

## 2013-05-22 MED ORDER — CEFAZOLIN SODIUM-DEXTROSE 2-3 GM-% IV SOLR
2.0000 g | INTRAVENOUS | Status: AC
  Administered 2013-05-22: 2 g via INTRAVENOUS

## 2013-05-22 MED ORDER — OXYCODONE HCL 5 MG/5ML PO SOLN: 5.0000 mg | Freq: Once | ORAL | Status: AC | PRN

## 2013-05-22 MED ORDER — ROPIVACAINE HCL 5 MG/ML IJ SOLN
INTRAMUSCULAR | Status: DC | PRN
  Administered 2013-05-22: 15 mL

## 2013-05-22 MED ORDER — ONDANSETRON HCL 4 MG/2ML IJ SOLN: 4.0000 mg | Freq: Once | INTRAMUSCULAR | Status: DC | PRN

## 2013-05-22 MED ORDER — OXYCODONE HCL 5 MG PO TABS
5.0000 mg | ORAL_TABLET | Freq: Once | ORAL | Status: AC | PRN
  Administered 2013-05-22: 5 mg via ORAL

## 2013-05-22 MED ORDER — LIDOCAINE HCL (CARDIAC) 20 MG/ML IV SOLN
INTRAVENOUS | Status: DC | PRN
  Administered 2013-05-22: 30 mg via INTRAVENOUS

## 2013-05-22 SURGICAL SUPPLY — 83 items
BAG DECANTER FOR FLEXI CONT (MISCELLANEOUS) IMPLANT
BANDAGE ESMARK 6X9 LF (GAUZE/BANDAGES/DRESSINGS) ×1 IMPLANT
BIT DRILL 2.0 (BIT) ×1
BIT DRILL 2XNS DISP SS SM FRAG (BIT) ×1 IMPLANT
BIT DRL 2XNS DISP SS SM FRAG (BIT) ×1
BLADE AVERAGE 25X9 (BLADE) ×2 IMPLANT
BLADE MICRO SAGITTAL (BLADE) IMPLANT
BLADE OSC/SAG .038X5.5 CUT EDG (BLADE) IMPLANT
BLADE SURG 15 STRL LF DISP TIS (BLADE) ×2 IMPLANT
BLADE SURG 15 STRL SS (BLADE) ×2
BNDG COHESIVE 4X5 TAN STRL (GAUZE/BANDAGES/DRESSINGS) ×2 IMPLANT
BNDG COHESIVE 6X5 TAN STRL LF (GAUZE/BANDAGES/DRESSINGS) ×2 IMPLANT
BNDG ESMARK 4X9 LF (GAUZE/BANDAGES/DRESSINGS) IMPLANT
BNDG ESMARK 6X9 LF (GAUZE/BANDAGES/DRESSINGS) ×2
CHLORAPREP W/TINT 26ML (MISCELLANEOUS) ×2 IMPLANT
CLOTH BEACON ORANGE TIMEOUT ST (SAFETY) IMPLANT
COVER TABLE BACK 60X90 (DRAPES) ×2 IMPLANT
CUFF TOURNIQUET SINGLE 34IN LL (TOURNIQUET CUFF) ×2 IMPLANT
DECANTER SPIKE VIAL GLASS SM (MISCELLANEOUS) IMPLANT
DRAPE C-ARM 42X72 X-RAY (DRAPES) IMPLANT
DRAPE C-ARMOR (DRAPES) IMPLANT
DRAPE EXTREMITY T 121X128X90 (DRAPE) ×2 IMPLANT
DRAPE OEC MINIVIEW 54X84 (DRAPES) ×2 IMPLANT
DRAPE U-SHAPE 47X51 STRL (DRAPES) ×2 IMPLANT
DRSG EMULSION OIL 3X3 NADH (GAUZE/BANDAGES/DRESSINGS) ×2 IMPLANT
DRSG PAD ABDOMINAL 8X10 ST (GAUZE/BANDAGES/DRESSINGS) ×4 IMPLANT
ELECT REM PT RETURN 9FT ADLT (ELECTROSURGICAL) ×2
ELECTRODE REM PT RTRN 9FT ADLT (ELECTROSURGICAL) ×1 IMPLANT
GLOVE BIO SURGEON STRL SZ8 (GLOVE) ×2 IMPLANT
GLOVE BIOGEL PI IND STRL 7.0 (GLOVE) ×1 IMPLANT
GLOVE BIOGEL PI IND STRL 7.5 (GLOVE) ×1 IMPLANT
GLOVE BIOGEL PI IND STRL 8 (GLOVE) ×1 IMPLANT
GLOVE BIOGEL PI INDICATOR 7.0 (GLOVE) ×1
GLOVE BIOGEL PI INDICATOR 7.5 (GLOVE) ×1
GLOVE BIOGEL PI INDICATOR 8 (GLOVE) ×1
GLOVE ECLIPSE 6.5 STRL STRAW (GLOVE) ×2 IMPLANT
GLOVE ECLIPSE 7.0 STRL STRAW (GLOVE) ×2 IMPLANT
GLOVE EXAM NITRILE MD LF STRL (GLOVE) ×2 IMPLANT
GOWN PREVENTION PLUS XLARGE (GOWN DISPOSABLE) ×4 IMPLANT
GOWN PREVENTION PLUS XXLARGE (GOWN DISPOSABLE) ×2 IMPLANT
K-WIRE .054X4 (WIRE) IMPLANT
K-WIRE ACE 1.6X6 (WIRE) ×2
KWIRE ACE 1.6X6 (WIRE) ×1 IMPLANT
NDL SAFETY ECLIPSE 18X1.5 (NEEDLE) IMPLANT
NEEDLE HYPO 18GX1.5 SHARP (NEEDLE)
NEEDLE HYPO 22GX1.5 SAFETY (NEEDLE) IMPLANT
PACK BASIN DAY SURGERY FS (CUSTOM PROCEDURE TRAY) ×2 IMPLANT
PAD CAST 4YDX4 CTTN HI CHSV (CAST SUPPLIES) ×2 IMPLANT
PADDING CAST ABS 4INX4YD NS (CAST SUPPLIES)
PADDING CAST ABS COTTON 4X4 ST (CAST SUPPLIES) IMPLANT
PADDING CAST COTTON 4X4 STRL (CAST SUPPLIES) ×2
PADDING CAST COTTON 6X4 STRL (CAST SUPPLIES) ×2 IMPLANT
PENCIL BUTTON HOLSTER BLD 10FT (ELECTRODE) ×2 IMPLANT
PLATE SM 1/4 TUBULAR 5H (Plate) ×2 IMPLANT
SANITIZER HAND PURELL 535ML FO (MISCELLANEOUS) ×2 IMPLANT
SCREW CORTICAL 2.7MM  18MM (Screw) ×3 IMPLANT
SCREW CORTICAL 2.7MM 16MM (Screw) ×2 IMPLANT
SCREW CORTICAL 2.7MM 18MM (Screw) ×3 IMPLANT
SCREW LAG  RD HEAD 4.0 42 LTH (Screw) ×1 IMPLANT
SCREW LAG RD HEAD 4.0 42 LTH (Screw) ×1 IMPLANT
SHEET MEDIUM DRAPE 40X70 STRL (DRAPES) ×2 IMPLANT
SLEEVE SCD COMPRESS KNEE MED (MISCELLANEOUS) ×2 IMPLANT
SPLINT FAST PLASTER 5X30 (CAST SUPPLIES) ×20
SPLINT PLASTER CAST FAST 5X30 (CAST SUPPLIES) ×20 IMPLANT
SPONGE GAUZE 4X4 12PLY (GAUZE/BANDAGES/DRESSINGS) ×2 IMPLANT
SPONGE LAP 18X18 X RAY DECT (DISPOSABLE) ×2 IMPLANT
STAPLER VISISTAT 35W (STAPLE) IMPLANT
STOCKINETTE 6  STRL (DRAPES) ×1
STOCKINETTE 6 STRL (DRAPES) ×1 IMPLANT
STRIP CLOSURE SKIN 1/2X4 (GAUZE/BANDAGES/DRESSINGS) IMPLANT
SUCTION FRAZIER TIP 10 FR DISP (SUCTIONS) ×2 IMPLANT
SUT ETHILON 3 0 PS 1 (SUTURE) ×2 IMPLANT
SUT MNCRL AB 3-0 PS2 18 (SUTURE) ×2 IMPLANT
SUT VIC AB 0 SH 27 (SUTURE) IMPLANT
SUT VIC AB 2-0 SH 18 (SUTURE) IMPLANT
SUT VIC AB 2-0 SH 27 (SUTURE) ×1
SUT VIC AB 2-0 SH 27XBRD (SUTURE) ×1 IMPLANT
SUT VICRYL 4-0 PS2 18IN ABS (SUTURE) IMPLANT
SYR BULB 3OZ (MISCELLANEOUS) ×2 IMPLANT
TOWEL OR 17X24 6PK STRL BLUE (TOWEL DISPOSABLE) ×2 IMPLANT
TOWEL OR NON WOVEN STRL DISP B (DISPOSABLE) IMPLANT
TUBE CONNECTING 20X1/4 (TUBING) ×2 IMPLANT
UNDERPAD 30X30 INCONTINENT (UNDERPADS AND DIAPERS) ×2 IMPLANT

## 2013-05-22 NOTE — Progress Notes (Signed)
Assisted Dr. Crews with right, ultrasound guided, popliteal/saphenous block. Side rails up, monitors on throughout procedure. See vital signs in flow sheet. Tolerated Procedure well. 

## 2013-05-22 NOTE — Brief Op Note (Signed)
05/22/2013  8:51 AM  PATIENT:  [name]  72 y.o. 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  Right foot bunionectomy 2.  Right hallux metatarsophalangeal joint arthrodesis 3.  Right foot AP and lateral radiographs  SURGEON:  Toni Arthurs, MD  ASSISTANT: Lorin Picket Flowers, PA-C  ANESTHESIA:   General, regional  EBL:  minimal   TOURNIQUET:   Total Tourniquet Time Documented: Thigh (Right) - 40 minutes Total: Thigh (Right) - 40 minutes   COMPLICATIONS:  None apparent  DISPOSITION:  Extubated, awake and stable to recovery.  DICTATION ID:  [id number]

## 2013-05-22 NOTE — Telephone Encounter (Signed)
Spoke with pt, had bunionectomy this morning and having extreme pain. Dr. Victorino Dike Rx'ed Percocet and pt is asking if Dr. Darrick Huntsman would Rx Oxycontin instead. Notified pt to contact her surgeon regarding her pain and medication. Pt states she does not want her husband to go back to [place]. Dr. Darrick Huntsman out of the office, Dr. Lorin Picket states pt should contact surgeon.  Pt understands Dr. Darrick Huntsman out of the office, will wait until she is in the office, would like message passed along in the morning and will call back.

## 2013-05-22 NOTE — Telephone Encounter (Signed)
Pt called back checking to see if someone would write her a rx for oxycoton .  She trying to keep her spouse from going back to [place]  [phone] Or cell phone # [phone]

## 2013-05-22 NOTE — Anesthesia Postprocedure Evaluation (Signed)
  Anesthesia Post-op Note  Patient: [name]  Procedure(s) Performed: Procedure(s): RIGHT HALLUX METATARSAL PHALANGEAL JOINT ARTHRODESIS  (Right)  Patient Location: PACU  Anesthesia Type:GA combined with regional for post-op pain  Level of Consciousness: awake, alert  and oriented  Airway and Oxygen Therapy: Patient Spontanous Breathing  Post-op Pain: mild  Post-op Assessment: Post-op Vital signs reviewed  Post-op Vital Signs: Reviewed  Complications: No apparent anesthesia complications

## 2013-05-22 NOTE — H&P (Signed)
[name] is an 72 y.o. female.   Chief Complaint: right hallux rigidus HPI: 72 y/o female with PMH of hallux rigidus c/o right forefoot pain for years.  She presents now for operative treatment having failed non operative treatment.  Past Medical History  Diagnosis Date  . Degenerative disc disease     HERNITAED DISC BY MRI ON 07/07;S/P LAMINECTOMY,RID REPLACEMENT  . Brain aneurysm     S/P RESECTION  . Discoid lupus erythematosus eyelid     NEGATIVE SEROLOGIC MARKERS  . Hypercholesterolemia   . Chronic sinusitis   . Hypertension   . Dysrhythmia     PVC's occ.  . Pneumonia 05/30/2010    after receiving Propofol  . Headache(784.0)     migraines occ.  . CHF (congestive heart failure) 05/2010    after receiving Propofol  . Hypothyroidism     takes Synthroid  . Blood transfusion 2009    back surgery  . Asthma 06/2010    depending on what around  . Complication of anesthesia 05/30/2010    from colonoscopy-had Propofol-had a  . Complication of anesthesia     reaction-went into asthma-pneumonia,-  . Complication of anesthesia     then CHF and to ICU  . Sleep apnea     uses CPAP-8.4-oxygen suppliment    Past Surgical History  Procedure Laterality Date  . Cosmetic surgery    . Tonsillectomy  1963  . Back surgery  1968,1988,2002,2009    4 laminectomies and fusions  . Abdominal hysterectomy  1988    uterus only  . Brain surgery  2005    coiling of cranial aneurysum  . Excision morton's neuroma  1990    left foot  . Nasal sinus surgery  1977-2008  . Lipomas  1975    3 from right breast  . Tympanic membrane repair  2009-last one    x5 times  . Total knee arthroplasty  07/10/2011    Procedure: TOTAL KNEE ARTHROPLASTY;  Surgeon: Loanne Drilling;  Location: [facility];  Service: Orthopedics;  Laterality: Right;  . Joint replacement  2013    Right knee,  [place]  . Eye surgery      both cataracts -[facility] 7/14    Family History  Problem Relation Age of Onset  . Heart disease  Mother   . Stroke Mother   . Alcohol abuse Other   . Cancer Sister     leukemia  . Cancer Brother     lung Ca, asbestos   Social History:  reports that she has been smoking Cigarettes.  She has a 20 pack-year smoking history. She has never used smokeless tobacco. She reports that  drinks alcohol. She reports that she does not use illicit drugs.  Allergies:  Allergies  Allergen Reactions  . Penicillins Anaphylaxis    Septra ANTI-INFECTIVE AGENTS-MISC..WELTS,ITCHING  . Propranolol     chf  . Triamcinolone Acetonide Hives  . Septra [Bactrim] Swelling and Rash    Medications Prior to Admission  Medication Sig Dispense Refill  . AMBIEN 5 MG tablet TAKE 1 TABLET AT BEDTIME  30 tablet  5  . benzonatate (TESSALON) 100 MG capsule TAKE 1 CAPSULE (100 MG TOTAL) BY MOUTH EVERY 4 (FOUR) HOURS.  120 capsule  2  . BYSTOLIC 5 MG tablet TAKE 1 TABLET BY MOUTH EVERY DAY  30 tablet  3  . CLARINEX 5 MG tablet TAKE 1 TABLET (5 MG TOTAL) BY MOUTH EVERY MORNING.  90 tablet  3  .  CRESTOR 20 MG tablet TAKE 1 TABLET (20 MG TOTAL) BY MOUTH AT BEDTIME.  30 tablet  3  . cyanocobalamin (,VITAMIN B-12,) 1000 MCG/ML injection Inject 1 mL (1,000 mcg total) into the muscle every 30 (thirty) days.  10 mL  3  . diazepam (VALIUM) 5 MG tablet TAKE 1 TABLET TWICE DAILY  60 tablet  3  . IMDUR 60 MG 24 hr tablet TAKE ONE BY MOUTH DAILY  30 tablet  6  . LEXAPRO 10 MG tablet TAKE 1 TABLET (10 MG TOTAL) BY MOUTH EVERY MORNING.  90 tablet  3  . LOVAZA 1 G capsule TAKE 2 CAPSULES TWICE A DAY  360 capsule  3  . montelukast (SINGULAIR) 10 MG tablet Take 1 tablet (10 mg total) by mouth at bedtime.  90 tablet  1  . NORVASC 10 MG tablet TAKE 1 TABLET (10 MG TOTAL) BY MOUTH DAILY.  30 tablet  5  . oxyCODONE-acetaminophen (PERCOCET) 10-325 MG per tablet Take 1 tablet by mouth every 6 (six) hours as needed.  120 tablet  0  . sodium chloride (OCEAN) 0.65 % nasal spray Place 2 sprays into the nose as needed. Allergies        .  SYNTHROID 150 MCG tablet TAKE 1 TABLET (150 MCG TOTAL) BY MOUTH DAILY.  90 tablet  3  . doxepin (SINEQUAN) 25 MG capsule Take 2 capsules (50 mg total) by mouth at bedtime.  30 capsule  5  . FeFum-FePo-FA-B Cmp-C-Zn-Mn-Cu (SE-TAN PLUS) 162-115.2-1 MG CAPS TAKE 1 TABLET BY MOUTH DAILY.  30 capsule  5  . LIDODERM 5 % PLACE 3 PATCHES ONTO THE SKIN DAILY. REMOVE & DISCARD PATCH WITHIN 12 HOURS OR AS DIRECTED BY MD  90 patch  3  . losartan (COZAAR) 50 MG tablet Take 1 tablet (50 mg total) by mouth 2 (two) times daily.  60 tablet  6  . nebivolol (BYSTOLIC) 5 MG tablet Take 1 tablet (5 mg total) by mouth daily.  30 tablet  3  . NEEDLE, DISP, 23 G (BD DISP NEEDLE) 23G X 1" MISC Uses to inject b12 into muscle once monthly  12 each  1  . promethazine (PHENERGAN) 12.5 MG tablet Take 1 tablet (12.5 mg total) by mouth every 8 (eight) hours as needed for nausea.  30 tablet  3    Results for orders placed during the hospital encounter of 05/22/13 (from the past 48 hour(s))  POCT HEMOGLOBIN-HEMACUE     Status: Abnormal   Collection Time    05/22/13  6:57 AM      Result Value Range   Hemoglobin 10.7 (*) 12.0 - 15.0 g/dL   No results found.  ROS  No recent f/c/n/v/wt loss  Blood pressure 135/74, pulse 58, temperature 98.1 F (36.7 C), resp. rate 20, height 5\' 3"  (1.6 m), weight 87.816 kg (193 lb 9.6 oz), SpO2 98.00%. Physical Exam  [age] wd female in nad.  A and O x 4.  Mood anda ffect normal.  EOMI.  resp unlabored.  R forefoot with healthy skin.  2+ dp and pt pulses.  Feels LT normally at the forefoot.  5/5 strength in PF and DF of the toes.  Decreased ROM at hallux MPJ.  No lymphadenopathy.  Assessment/Plan Right hallux rigidus - to OR for hallux MPJ arthrodesis.  The risks and benefits of the alternative treatment options have been discussed in detail.  The patient wishes to proceed with surgery and specifically understands risks of bleeding, infection, nerve damage,  blood clots, need for additional  surgery, amputation and death.   Toni Arthurs Jun 11, 2013, 7:17 AM

## 2013-05-22 NOTE — Transfer of Care (Signed)
Immediate Anesthesia Transfer of Care Note  Patient: [name]  Procedure(s) Performed: Procedure(s): RIGHT HALLUX METATARSAL PHALANGEAL JOINT ARTHRODESIS  (Right)  Patient Location: PACU  Anesthesia Type:GA combined with regional for post-op pain  Level of Consciousness: awake, alert , oriented and patient cooperative  Airway & Oxygen Therapy: Patient Spontanous Breathing and Patient connected to face mask oxygen  Post-op Assessment: Report given to PACU RN and Post -op Vital signs reviewed and stable  Post vital signs: Reviewed and stable  Complications: No apparent anesthesia complications

## 2013-05-22 NOTE — Telephone Encounter (Signed)
Pt just had Bunion surgery and is hurting very badly "Like [place]" as she put it. He put on Oxycodon but it's not working and she is wanting Oxycoton instead. She states that Dr. Victorino Dike is in [place] and she can't travel all the way back to get it. She was just wanting a week or a week and a half worth of medication to get her by.

## 2013-05-22 NOTE — Anesthesia Procedure Notes (Addendum)
Procedure Name: LMA Insertion Date/Time: 05/22/2013 7:36 AM Performed by: BLOCKER, TIMOTHY D Pre-anesthesia Checklist: Patient identified, Emergency Drugs available, Suction available and Patient being monitored Patient Re-evaluated:Patient Re-evaluated prior to inductionOxygen Delivery Method: Circle System Utilized Preoxygenation: Pre-oxygenation with 100% oxygen Intubation Type: IV induction Ventilation: Mask ventilation without difficulty LMA: LMA inserted LMA Size: 4.0 Number of attempts: 1 Airway Equipment and Method: bite block Placement Confirmation: positive ETCO2 Tube secured with: Tape Dental Injury: Teeth and Oropharynx as per pre-operative assessment    Anesthesia Regional Block:  Popliteal block  Pre-Anesthetic Checklist: ,, timeout performed, Correct Patient, Correct Site, Correct Laterality, Correct Procedure, Correct Position, site marked, Risks and benefits discussed,  Surgical consent,  Pre-op evaluation,  At surgeon's request and post-op pain management  Laterality: Right and Lower  Prep: chloraprep       Needles:  Injection technique: Single-shot  Needle Type: Echogenic Needle     Needle Length: 9cm  Needle Gauge: 21 and 21 G    Additional Needles:  Procedures: ultrasound guided (picture in chart) Popliteal block Narrative:  Start time: 05/22/2013 7:14 AM End time: 05/22/2013 7:22 AM Injection made incrementally with aspirations every 5 mL.  Performed by: Personally  Anesthesiologist: Sheldon Silvan, MD  Additional Notes: After the Popliteal block, the pt was turned supine and the R upper thigh was prepped in a similar manner.  Using US guidance, the R saphenous was located and, using incremental aspirations a total of 15 ml of Ropivacaine was given.    Popliteal block

## 2013-05-22 NOTE — Anesthesia Preprocedure Evaluation (Addendum)
Anesthesia Evaluation  Patient identified by MRN, date of birth, ID band Patient awake    Reviewed: Allergy & Precautions, NPO status   Airway Mallampati: I TM Distance: >3 FB Neck ROM: Full    Dental  (+) Dental Advisory Given and Partial Lower   Pulmonary asthma , sleep apnea and Continuous Positive Airway Pressure Ventilation ,  breath sounds clear to auscultation        Cardiovascular hypertension, Pt. on home beta blockers + Peripheral Vascular Disease and +CHF + dysrhythmias Rhythm:Regular Rate:Normal     Neuro/Psych  Headaches,    GI/Hepatic   Endo/Other  Hypothyroidism   Renal/GU      Musculoskeletal   Abdominal   Peds  Hematology   Anesthesia Other Findings   Reproductive/Obstetrics                          Anesthesia Physical Anesthesia Plan  ASA: III  Anesthesia Plan: General   Post-op Pain Management:    Induction: Intravenous  Airway Management Planned: LMA  Additional Equipment:   Intra-op Plan:   Post-operative Plan:   Informed Consent: I have reviewed the patients History and Physical, chart, labs and discussed the procedure including the risks, benefits and alternatives for the proposed anesthesia with the patient or authorized representative who has indicated his/her understanding and acceptance.   Dental advisory given  Plan Discussed with: CRNA, Anesthesiologist and Surgeon  Anesthesia Plan Comments:         Anesthesia Quick Evaluation

## 2013-05-23 ENCOUNTER — Encounter (HOSPITAL_BASED_OUTPATIENT_CLINIC_OR_DEPARTMENT_OTHER): Payer: Self-pay | Admitting: Orthopedic Surgery

## 2013-05-23 NOTE — Telephone Encounter (Signed)
I will only give her a week of oxycontin she she is already receiving percocet from me. Please ask her waht does she needs since she is requesting it

## 2013-05-23 NOTE — Op Note (Signed)
[name], [name]                   ACCOUNT NO.:  [id number]  MEDICAL RECORD NO.:  [id number]  LOCATION:                                 FACILITY:  PHYSICIAN:  Toni Arthurs, MD             DATE OF BIRTH:  DATE OF PROCEDURE:  05/22/2013 DATE OF DISCHARGE:                              OPERATIVE REPORT   PREOPERATIVE DIAGNOSES: 1. 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  POSTOPERATIVE DIAGNOSES: 1. 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  PROCEDURES: 1. Right foot bunionectomy. 2. Right hallux metatarsophalangeal joint arthrodesis. 3. Right foot AP and lateral radiographs.  SURGEON:  Toni Arthurs, MD.  ASSISTANTLorin Picket Flowers, PA-C.  ANESTHESIA:  General, regional.  ESTIMATED BLOOD LOSS:  Minimal.  TOURNIQUET TIME:  40 minutes at 220 mmHg.  COMPLICATIONS:  None apparent.  DISPOSITION:  Extubated, awake and stable to recovery.  INDICATIONS FOR PROCEDURE:  The patient is a 72 year old female with a long history of right forefoot pain.  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  She presents now for operative treatment of this condition, having failed nonoperative treatment.  She understands the risks and benefits, the alternative treatment options and elects surgical treatment.  She specifically understands risks of bleeding, infection, nerve damage, blood clots, need for additional surgery, amputation, and death.  PROCEDURE IN DETAIL:  After preoperative consent was obtained and the correct operative site was identified, the patient was brought to the operating room and placed supine on the operating table.  General anesthesia was induced.  Preoperative antibiotics were administered. Surgical time-out was taken.  The right lower extremity was prepped and draped in standard sterile fashion with a tourniquet around the thigh. The extremity was exsanguinated and the tourniquet was inflated to 220 mmHg.  A  longitudinal incision was made over the hallux MP joint.  Sharp dissection was carried down through the skin and subcutaneous tissue. The extensor hallucis longus tendon was mobilized laterally and was protected throughout the case.  The extensor hallucis brevis tendon was released from its insertion on the proximal phalanx.  The head of the metatarsal was exposed after releasing the collateral ligaments.  The patient's medial eminence was noted to be quite hypertrophic.  An oscillating saw was then used to perform a bunionectomy by resecting the medial eminence at the level of the sagittal sulcus in line with the first metatarsal shaft.  The cut edges of bone were smoothed with the saw.  A K-wire was then inserted in the head of the first metatarsal centrally.  A convex reamer was then used to remove all the articular cartilage and subchondral bone from the head of the metatarsal.  The guidewire was removed and placed in the base of the proximal phalanx.  A convex reamer was then used to remove all the articular cartilage and subchondral bone.  Wound was irrigated copiously.  A 2-mm drill bit was then used to perforate the head of the metatarsal and the base of the proximal phalanx in multiple locations leaving the resultant bone graft in place.  The joint  was reduced.  It was provisionally pinned.  AP and lateral fluoroscopic images confirmed appropriate position of the hallux MP joint.  A weightbearing examination was performed with a footplate and the dorsiflexion was noted to be appropriate.  A second guidepin was then placed percutaneously from proximal medial to distal lateral across the MP joint.  AP and lateral fluoroscopic images confirmed appropriate position of this pin.  Stab incision was made and a 4-mm partially- threaded cannulated screw was inserted compressing the joint appropriately.  The AP and lateral fluoroscopic images again confirmed appropriate reduction of the  joint.  A five-hole 1/4 tubular plate was selected from the [employer] Mini[employer]Frag set.  It was fixed proximally with a unicortical screw and a bicortical screw.  Distally, it was fixed with unicortical screw and a bicortical screw.  These were 2.7-mm fully-threaded cortical screws.  The guidepins were removed.  Final AP and lateral fluoroscopic images confirmed appropriate position and length of all hardware and appropriate correction of the hallux valgus and hallux rigidus conditions.  The extensor hallucis brevis tendon was then repaired over the plate with simple sutures of 2-0 Vicryl.  Subcutaneous tissue was approximated with inverted simple sutures of 3-0 Monocryl, running 3-0 nylon was used to close the skin incision.  Sterile dressings were applied followed by a well-padded short-leg splint.  Tourniquet was released at 40 minutes. The patient was then awakened from anesthesia and transported to the recovery room in stable condition.  FOLLOWUP PLAN:  The patient will be nonweightbearing on the right lower extremity and follow up with me in 2 weeks for suture removal and conversion to a Cam walker boot.  RADIOGRAPHS:  AP and lateral fluoroscopic images of the right foot were obtained.  Simulated weightbearing intraoperatively today.  This showed interval correction of the hallux valgus and hallux rigidus conditions. Hardware was in appropriate position across the hallux MP joint.  Scott Flowers, PA-C was present and scrubbed for the duration of the case.  His assistance was critical in gaining and obtaining exposure, performing the operation, closing the wound, applying dressings and the splint.     Toni Arthurs, MD     JH/MEDQ  D:  05/22/2013  T:  05/23/2013  Job:  [id number]

## 2013-05-27 NOTE — Telephone Encounter (Signed)
Patient had foot surgery last week but says she does not need the oxycontin at this time, the percocet is working just fine.

## 2013-06-03 ENCOUNTER — Other Ambulatory Visit: Payer: Self-pay | Admitting: Internal Medicine

## 2013-06-05 ENCOUNTER — Other Ambulatory Visit: Payer: Self-pay | Admitting: Internal Medicine

## 2013-06-12 ENCOUNTER — Other Ambulatory Visit: Payer: Self-pay | Admitting: Internal Medicine

## 2013-06-16 ENCOUNTER — Other Ambulatory Visit: Payer: Self-pay | Admitting: Internal Medicine

## 2013-06-17 NOTE — Telephone Encounter (Signed)
Script faxed to pharmacy

## 2013-06-24 ENCOUNTER — Other Ambulatory Visit: Payer: Self-pay | Admitting: Internal Medicine

## 2013-06-24 DIAGNOSIS — M1711 Unilateral primary osteoarthritis, right knee: Secondary | ICD-10-CM

## 2013-06-24 NOTE — Telephone Encounter (Signed)
Last visit 03/27/13, refill? 

## 2013-06-24 NOTE — Telephone Encounter (Signed)
Pt states she will be coming on Thursday to pick up rx for percocet.  Pt would like a call letting her know this is ready to be picked up.

## 2013-06-26 ENCOUNTER — Ambulatory Visit: Payer: Self-pay | Admitting: Oncology

## 2013-06-26 ENCOUNTER — Other Ambulatory Visit: Payer: Self-pay

## 2013-06-26 MED ORDER — OXYCODONE-ACETAMINOPHEN 10-325 MG PO TABS: 1.0000 | ORAL_TABLET | Freq: Four times a day (QID) | ORAL | Status: DC | PRN

## 2013-06-26 NOTE — Telephone Encounter (Signed)
Pt notified Rx ready for pickup 

## 2013-07-02 ENCOUNTER — Other Ambulatory Visit: Payer: Self-pay | Admitting: *Deleted

## 2013-07-02 MED ORDER — LOSARTAN POTASSIUM 50 MG PO TABS: 50.0000 mg | ORAL_TABLET | Freq: Two times a day (BID) | ORAL | Status: DC

## 2013-07-15 ENCOUNTER — Other Ambulatory Visit: Payer: Self-pay | Admitting: Internal Medicine

## 2013-07-16 ENCOUNTER — Other Ambulatory Visit: Payer: Self-pay | Admitting: Internal Medicine

## 2013-07-16 NOTE — Telephone Encounter (Signed)
Last visit 03/27/13, ok to refill?

## 2013-07-24 ENCOUNTER — Ambulatory Visit (INDEPENDENT_AMBULATORY_CARE_PROVIDER_SITE_OTHER): Payer: Federal, State, Local not specified - PPO | Admitting: Adult Health

## 2013-07-24 ENCOUNTER — Encounter: Payer: Self-pay | Admitting: Adult Health

## 2013-07-24 ENCOUNTER — Other Ambulatory Visit: Payer: Self-pay | Admitting: *Deleted

## 2013-07-24 VITALS — BP 136/80 | HR 95 | Temp 99.4°F | Resp 16

## 2013-07-24 DIAGNOSIS — M1711 Unilateral primary osteoarthritis, right knee: Secondary | ICD-10-CM

## 2013-07-24 DIAGNOSIS — R05 Cough: Secondary | ICD-10-CM

## 2013-07-24 DIAGNOSIS — E039 Hypothyroidism, unspecified: Secondary | ICD-10-CM

## 2013-07-24 MED ORDER — ALBUTEROL SULFATE (2.5 MG/3ML) 0.083% IN NEBU: 2.5000 mg | INHALATION_SOLUTION | Freq: Four times a day (QID) | RESPIRATORY_TRACT | Status: DC | PRN

## 2013-07-24 MED ORDER — ALBUTEROL SULFATE HFA 108 (90 BASE) MCG/ACT IN AERS: 2.0000 | INHALATION_SPRAY | Freq: Four times a day (QID) | RESPIRATORY_TRACT | Status: DC | PRN

## 2013-07-24 MED ORDER — OXYCODONE-ACETAMINOPHEN 10-325 MG PO TABS: 1.0000 | ORAL_TABLET | Freq: Four times a day (QID) | ORAL | Status: DC | PRN

## 2013-07-24 MED ORDER — DOXYCYCLINE HYCLATE 100 MG PO TABS: 100.0000 mg | ORAL_TABLET | Freq: Two times a day (BID) | ORAL | Status: DC

## 2013-07-24 MED ORDER — MONTELUKAST SODIUM 10 MG PO TABS: ORAL_TABLET | ORAL | Status: DC

## 2013-07-24 NOTE — Telephone Encounter (Signed)
Scripts sent electronically.

## 2013-07-24 NOTE — Progress Notes (Signed)
Pre visit review using our clinic review tool, if applicable. No additional management support is needed unless otherwise documented below in the visit note. 

## 2013-07-24 NOTE — Telephone Encounter (Signed)
In office ask if all scripts could be written for 90 days and also needs refill on percocet last fill was on 06/24/13 for percocet please advise.

## 2013-07-24 NOTE — Telephone Encounter (Signed)
Pt notified Rx ready for pickup 

## 2013-07-24 NOTE — Telephone Encounter (Signed)
Left message for pt to return my call.

## 2013-07-24 NOTE — Patient Instructions (Signed)
Start Doxycycline 100 mg twice daily for 10 days.  I have sent in a prescription for albuterol inhaler and nebulizer solution. Use one or the other at one time.  Tylenol for fever  If no improvement within 4-5 days please let us know.

## 2013-07-24 NOTE — Progress Notes (Signed)
Subjective:    Patient ID: [name], female    DOB: 03-03-1941, 72 y.o.   MRN: [id number]  HPI  Patient is a pleasant 72 y/o female who presents with cough, fever. Symptoms began on Monday. She also report wheezing and feeling slightly tight in her chest.    Current Outpatient Prescriptions on File Prior to Visit  Medication Sig Dispense Refill  . AMBIEN 5 MG tablet TAKE 1 TABLET AT BEDTIME  30 tablet  5  . benzonatate (TESSALON) 100 MG capsule TAKE 1 CAPSULE (100 MG TOTAL) BY MOUTH EVERY 4 (FOUR) HOURS.  120 capsule  2  . BYSTOLIC 5 MG tablet TAKE 1 TABLET BY MOUTH EVERY DAY  30 tablet  3  . CLARINEX 5 MG tablet TAKE 1 TABLET (5 MG TOTAL) BY MOUTH EVERY MORNING.  90 tablet  3  . CRESTOR 20 MG tablet TAKE 1 TABLET (20 MG TOTAL) BY MOUTH AT BEDTIME.  30 tablet  5  . cyanocobalamin (,VITAMIN B-12,) 1000 MCG/ML injection Inject 1 mL (1,000 mcg total) into the muscle every 30 (thirty) days.  10 mL  3  . doxepin (SINEQUAN) 25 MG capsule Take 2 capsules (50 mg total) by mouth at bedtime.  30 capsule  5  . FeFum-FePo-FA-B Cmp-C-Zn-Mn-Cu (SE-TAN PLUS) 162-115.2-1 MG CAPS TAKE 1 TABLET BY MOUTH DAILY.  30 capsule  5  . IMDUR 60 MG 24 hr tablet TAKE ONE BY MOUTH DAILY  30 tablet  6  . LEXAPRO 10 MG tablet TAKE 1 TABLET (10 MG TOTAL) BY MOUTH EVERY MORNING.  90 tablet  3  . LIDODERM 5 % PLACE 3 PATCHES ONTO THE SKIN DAILY. REMOVE & DISCARD PATCH WITHIN 12 HOURS OR AS DIRECTED BY MD  90 patch  3  . losartan (COZAAR) 50 MG tablet Take 1 tablet (50 mg total) by mouth 2 (two) times daily.  60 tablet  6  . LOVAZA 1 G capsule TAKE 2 CAPSULES TWICE A DAY  360 capsule  3  . NEEDLE, DISP, 23 G (BD DISP NEEDLE) 23G X 1" MISC Uses to inject b12 into muscle once monthly  12 each  1  . NORVASC 10 MG tablet TAKE 1 TABLET (10 MG TOTAL) BY MOUTH DAILY.  30 tablet  5  . oxyCODONE (ROXICODONE) 5 MG immediate release tablet Take 1-2 tablets (5-10 mg total) by mouth every 4 (four) hours as needed for pain.  30  tablet  0  . oxyCODONE-acetaminophen (PERCOCET) 10-325 MG per tablet Take 1 tablet by mouth every 6 (six) hours as needed.  120 tablet  0  . SINGULAIR 10 MG tablet TAKE 1 TABLET (10 MG TOTAL) BY MOUTH AT BEDTIME.  90 tablet  1  . sodium chloride (OCEAN) 0.65 % nasal spray Place 2 sprays into the nose as needed. Allergies        . SYNTHROID 150 MCG tablet TAKE 1 TABLET (150 MCG TOTAL) BY MOUTH DAILY.  90 tablet  3  . VALIUM 5 MG tablet TAKE 1 TABLET TWICE DAILY  60 tablet  3  . nebivolol (BYSTOLIC) 5 MG tablet Take 1 tablet (5 mg total) by mouth daily.  30 tablet  3   Current Facility-Administered Medications on File Prior to Visit  Medication Dose Route Frequency Provider Last Rate Last Dose  . chlorhexidine (HIBICLENS) 4 % liquid 4 application  60 mL Topical Once Loanne Drilling, MD         Review of Systems  Constitutional: Positive  for fever and chills.  HENT: Positive for congestion, postnasal drip and sinus pressure.   Respiratory: Positive for cough.   Musculoskeletal:       General malaise  Neurological: Positive for headaches.       Objective:   Physical Exam  Constitutional: She is oriented to person, place, and time. No distress.  HENT:  Head: Normocephalic and atraumatic.  Cardiovascular: Normal rate, regular rhythm and normal heart sounds.   Pulmonary/Chest: Effort normal and breath sounds normal. No respiratory distress. She has no wheezes. She has no rales.  Neurological: She is alert and oriented to person, place, and time.  Skin: Skin is warm and dry.  Psychiatric: She has a normal mood and affect. Her behavior is normal. Thought content normal.    BP 136/80  Pulse 95  Temp(Src) 99.4 F (37.4 C) (Oral)  Resp 16  SpO2 95%       Assessment & Plan:

## 2013-07-24 NOTE — Telephone Encounter (Signed)
Ok to refill everything 90 days except percocet, which i have refilled

## 2013-07-25 MED ORDER — ESCITALOPRAM OXALATE 10 MG PO TABS: ORAL_TABLET | ORAL | Status: DC

## 2013-07-25 MED ORDER — DOXEPIN HCL 25 MG PO CAPS: 50.0000 mg | ORAL_CAPSULE | Freq: Every day | ORAL | Status: DC

## 2013-07-25 MED ORDER — SE-TAN PLUS 162-115.2-1 MG PO CAPS: ORAL_CAPSULE | ORAL | Status: DC

## 2013-07-25 MED ORDER — ROSUVASTATIN CALCIUM 20 MG PO TABS: ORAL_TABLET | ORAL | Status: DC

## 2013-07-25 MED ORDER — DIAZEPAM 5 MG PO TABS: ORAL_TABLET | ORAL | Status: DC

## 2013-07-25 MED ORDER — AMLODIPINE BESYLATE 10 MG PO TABS: ORAL_TABLET | ORAL | Status: DC

## 2013-07-25 MED ORDER — CYANOCOBALAMIN 1000 MCG/ML IJ SOLN: 1000.0000 ug | INTRAMUSCULAR | Status: AC

## 2013-07-25 MED ORDER — LIDOCAINE 5 % EX PTCH: MEDICATED_PATCH | CUTANEOUS | Status: DC

## 2013-07-25 MED ORDER — AMBIEN 5 MG PO TABS: ORAL_TABLET | ORAL | Status: DC

## 2013-07-25 MED ORDER — ISOSORBIDE MONONITRATE ER 60 MG PO TB24: ORAL_TABLET | ORAL | Status: DC

## 2013-07-25 MED ORDER — BENZONATATE 100 MG PO CAPS: ORAL_CAPSULE | ORAL | Status: DC

## 2013-07-25 MED ORDER — OMEGA-3-ACID ETHYL ESTERS 1 G PO CAPS: ORAL_CAPSULE | ORAL | Status: DC

## 2013-07-25 MED ORDER — LOSARTAN POTASSIUM 50 MG PO TABS: 50.0000 mg | ORAL_TABLET | Freq: Two times a day (BID) | ORAL | Status: DC

## 2013-07-25 MED ORDER — "NEEDLE (DISP) 23G X 1"" MISC": Status: AC

## 2013-07-25 MED ORDER — SYNTHROID 150 MCG PO TABS: ORAL_TABLET | ORAL | Status: DC

## 2013-07-25 NOTE — Assessment & Plan Note (Signed)
Albuterol treatment in clinic given. Albuterol inhaler and also for nebulizer at home. Instructed pt to use one or the other. Doxycycline started. RTC if no improvement of symptoms

## 2013-07-26 ENCOUNTER — Inpatient Hospital Stay: Payer: Self-pay | Admitting: Specialist

## 2013-07-26 LAB — COMPREHENSIVE METABOLIC PANEL
Albumin: 2.5 g/dL — ABNORMAL LOW (ref 3.4–5.0)
Anion Gap: 11 (ref 7–16)
Calcium, Total: 9.7 mg/dL (ref 8.5–10.1)
Co2: 20 mmol/L — ABNORMAL LOW (ref 21–32)
Osmolality: 277 (ref 275–301)
Potassium: 4 mmol/L (ref 3.5–5.1)
SGOT(AST): 43 U/L — ABNORMAL HIGH (ref 15–37)
SGPT (ALT): 23 U/L (ref 12–78)
Sodium: 137 mmol/L (ref 136–145)
Total Protein: 6.2 g/dL — ABNORMAL LOW (ref 6.4–8.2)

## 2013-07-26 LAB — CBC
HCT: 33.3 % — ABNORMAL LOW (ref 35.0–47.0)
MCHC: 32.9 g/dL (ref 32.0–36.0)
MCV: 95 fL (ref 80–100)
Platelet: 134 10*3/uL — ABNORMAL LOW (ref 150–440)
WBC: 12.3 10*3/uL — ABNORMAL HIGH (ref 3.6–11.0)

## 2013-07-26 LAB — URINALYSIS, COMPLETE
Bilirubin,UR: NEGATIVE
Glucose,UR: NEGATIVE mg/dL (ref 0–75)
Nitrite: POSITIVE
Ph: 6 (ref 4.5–8.0)
Specific Gravity: 1.011 (ref 1.003–1.030)
WBC UR: 1 /HPF (ref 0–5)

## 2013-07-26 LAB — MAGNESIUM: Magnesium: 1.5 mg/dL — ABNORMAL LOW

## 2013-07-26 LAB — CK TOTAL AND CKMB (NOT AT ARMC): CK, Total: 78 U/L (ref 21–215)

## 2013-07-26 LAB — PROTIME-INR: Prothrombin Time: 12.8 secs (ref 11.5–14.7)

## 2013-07-26 LAB — RAPID INFLUENZA A&B ANTIGENS

## 2013-07-26 LAB — TROPONIN I: Troponin-I: <0.02 ng/mL

## 2013-07-27 DIAGNOSIS — I059 Rheumatic mitral valve disease, unspecified: Secondary | ICD-10-CM

## 2013-07-27 LAB — BASIC METABOLIC PANEL
Anion Gap: 10 (ref 7–16)
BUN: 16 mg/dL (ref 7–18)
BUN: 18 mg/dL (ref 7–18)
Calcium, Total: 8.9 mg/dL (ref 8.5–10.1)
Calcium, Total: 8.8 mg/dL (ref 8.5–10.1)
Chloride: 111 mmol/L — ABNORMAL HIGH (ref 98–107)
Chloride: 106 mmol/L (ref 98–107)
Co2: 23 mmol/L (ref 21–32)
Co2: 22 mmol/L (ref 21–32)
EGFR (Non-African Amer.): 41 — ABNORMAL LOW
Glucose: 101 mg/dL — ABNORMAL HIGH (ref 65–99)
Osmolality: 279 (ref 275–301)
Potassium: 3.7 mmol/L (ref 3.5–5.1)
Sodium: 139 mmol/L (ref 136–145)
Sodium: 138 mmol/L (ref 136–145)

## 2013-07-27 LAB — CBC WITH DIFFERENTIAL/PLATELET
Basophil #: 0 10*3/uL (ref 0.0–0.1)
Basophil %: 0.1 %
Eosinophil #: 0 10*3/uL (ref 0.0–0.7)
Eosinophil %: 0.1 %
HCT: 30.9 % — ABNORMAL LOW (ref 35.0–47.0)
Lymphocyte #: 1.2 10*3/uL (ref 1.0–3.6)
Lymphocyte %: 9.1 %
MCH: 31.9 pg (ref 26.0–34.0)
MCV: 96 fL (ref 80–100)
Monocyte %: 6 %
Neutrophil %: 84.7 %
RDW: 13 % (ref 11.5–14.5)
WBC: 13.5 10*3/uL — ABNORMAL HIGH (ref 3.6–11.0)

## 2013-07-27 LAB — MAGNESIUM: Magnesium: 1.2 mg/dL — ABNORMAL LOW

## 2013-07-28 ENCOUNTER — Telehealth: Payer: Self-pay | Admitting: *Deleted

## 2013-07-28 LAB — BASIC METABOLIC PANEL
Anion Gap: 11 (ref 7–16)
BUN: 19 mg/dL — ABNORMAL HIGH (ref 7–18)
Calcium, Total: 9 mg/dL (ref 8.5–10.1)
EGFR (African American): 44 — ABNORMAL LOW
EGFR (Non-African Amer.): 38 — ABNORMAL LOW
Glucose: 105 mg/dL — ABNORMAL HIGH (ref 65–99)
Osmolality: 276 (ref 275–301)
Potassium: 3.5 mmol/L (ref 3.5–5.1)

## 2013-07-28 LAB — CBC WITH DIFFERENTIAL/PLATELET
Basophil #: 0 10*3/uL (ref 0.0–0.1)
Basophil %: 0.4 %
Lymphocyte #: 1.9 10*3/uL (ref 1.0–3.6)
MCH: 32.2 pg (ref 26.0–34.0)
MCHC: 34.1 g/dL (ref 32.0–36.0)
MCV: 95 fL (ref 80–100)
Monocyte %: 7 %
Neutrophil #: 7.1 10*3/uL — ABNORMAL HIGH (ref 1.4–6.5)
Neutrophil %: 71.8 %
Platelet: 128 10*3/uL — ABNORMAL LOW (ref 150–440)
RBC: 3.08 10*6/uL — ABNORMAL LOW (ref 3.80–5.20)
RDW: 13.1 % (ref 11.5–14.5)

## 2013-07-28 NOTE — Telephone Encounter (Signed)
Patient is in CCU @ [facility] with Bilateral pneumonia with a DNR, Intubated   . Patient husband very upset and stated she was seen here last weak and was diagnosed with sinusitis. Advised that sinusitis symptoms are very different from pneumonia and would deliver message to you, [facility]

## 2013-07-29 LAB — CBC WITH DIFFERENTIAL/PLATELET
Basophil %: 0.2 %
Eosinophil #: 0 10*3/uL (ref 0.0–0.7)
Eosinophil %: 0.1 %
HCT: 28 % — ABNORMAL LOW (ref 35.0–47.0)
Lymphocyte #: 1.1 10*3/uL (ref 1.0–3.6)
MCH: 30.6 pg (ref 26.0–34.0)
Monocyte %: 3.7 %
Neutrophil %: 73.8 %
Platelet: 117 10*3/uL — ABNORMAL LOW (ref 150–440)
RBC: 2.98 10*6/uL — ABNORMAL LOW (ref 3.80–5.20)

## 2013-07-29 LAB — BASIC METABOLIC PANEL
Calcium, Total: 9 mg/dL (ref 8.5–10.1)
Chloride: 112 mmol/L — ABNORMAL HIGH (ref 98–107)
Creatinine: 1.29 mg/dL (ref 0.60–1.30)
EGFR (African American): 49 — ABNORMAL LOW
Glucose: 159 mg/dL — ABNORMAL HIGH (ref 65–99)
Osmolality: 286 (ref 275–301)
Potassium: 3.7 mmol/L (ref 3.5–5.1)
Sodium: 140 mmol/L (ref 136–145)

## 2013-07-29 LAB — PHOSPHORUS: Phosphorus: 2.6 mg/dL (ref 2.5–4.9)

## 2013-07-29 LAB — URINE CULTURE

## 2013-07-29 LAB — MAGNESIUM: Magnesium: 2 mg/dL

## 2013-07-29 NOTE — Telephone Encounter (Signed)
Please tell Mr Lomba that I am very sorry that Ms Estala is doing so poorly.  She was seen by raquel last week and treated with antibiotics and breathing treatments and told to return if she did not improve.  I would not have done anything differently

## 2013-07-29 NOTE — Telephone Encounter (Signed)
Left message for patient to return call.

## 2013-07-30 LAB — CBC WITH DIFFERENTIAL/PLATELET
Basophil #: 0 10*3/uL (ref 0.0–0.1)
Basophil %: 0.1 %
Eosinophil #: 0 10*3/uL (ref 0.0–0.7)
Eosinophil %: 0 %
HCT: 28.5 % — ABNORMAL LOW (ref 35.0–47.0)
HGB: 9.5 g/dL — ABNORMAL LOW (ref 12.0–16.0)
Lymphocyte #: 1.1 10*3/uL (ref 1.0–3.6)
Lymphocyte %: 15.2 %
MCH: 31.5 pg (ref 26.0–34.0)
Monocyte #: 0.5 x10 3/mm (ref 0.2–0.9)
Monocyte %: 6.3 %
Neutrophil %: 78.4 %
Platelet: 150 10*3/uL (ref 150–440)
RBC: 3.02 10*6/uL — ABNORMAL LOW (ref 3.80–5.20)
RDW: 13.2 % (ref 11.5–14.5)
WBC: 7.4 10*3/uL (ref 3.6–11.0)

## 2013-07-30 LAB — BASIC METABOLIC PANEL
BUN: 40 mg/dL — ABNORMAL HIGH (ref 7–18)
EGFR (Non-African Amer.): 40 — ABNORMAL LOW
Glucose: 251 mg/dL — ABNORMAL HIGH (ref 65–99)
Sodium: 139 mmol/L (ref 136–145)

## 2013-07-30 LAB — VANCOMYCIN, TROUGH: Vancomycin, Trough: 14 ug/mL (ref 10–20)

## 2013-07-31 LAB — BASIC METABOLIC PANEL
Creatinine: 1.21 mg/dL (ref 0.60–1.30)
EGFR (Non-African Amer.): 45 — ABNORMAL LOW
Glucose: 236 mg/dL — ABNORMAL HIGH (ref 65–99)
Sodium: 140 mmol/L (ref 136–145)

## 2013-07-31 LAB — CULTURE, BLOOD (SINGLE)

## 2013-08-02 ENCOUNTER — Other Ambulatory Visit: Payer: Self-pay | Admitting: Internal Medicine

## 2013-08-02 LAB — CBC WITH DIFFERENTIAL/PLATELET
Basophil #: 0 x10 3/mm 3
Basophil %: 0.2 %
Eosinophil #: 0.1 x10 3/mm 3
Eosinophil %: 0.6 %
HCT: 30.7 % — ABNORMAL LOW
HGB: 9.9 g/dL — ABNORMAL LOW
Lymphocyte %: 29.7 %
Lymphs Abs: 4.4 x10 3/mm 3 — ABNORMAL HIGH
MCH: 29.9 pg
MCHC: 32.4 g/dL
MCV: 93 fL
Monocyte #: 0.9 "x10 3/mm "
Monocyte %: 5.9 %
Neutrophil #: 9.3 x10 3/mm 3 — ABNORMAL HIGH
Neutrophil %: 63.6 %
Platelet: 182 x10 3/mm 3
RBC: 3.32 X10 6/mm 3 — ABNORMAL LOW
RDW: 13 %
WBC: 14.6 x10 3/mm 3 — ABNORMAL HIGH

## 2013-08-02 LAB — BASIC METABOLIC PANEL WITH GFR
Anion Gap: 4 — ABNORMAL LOW
BUN: 40 mg/dL — ABNORMAL HIGH
Calcium, Total: 8.9 mg/dL
Chloride: 110 mmol/L — ABNORMAL HIGH
Co2: 25 mmol/L
Creatinine: 1 mg/dL
EGFR (African American): >60
EGFR (Non-African Amer.): 57 — ABNORMAL LOW
Glucose: 125 mg/dL — ABNORMAL HIGH
Osmolality: 289
Potassium: 3.8 mmol/L
Sodium: 139 mmol/L

## 2013-08-03 LAB — BASIC METABOLIC PANEL
Anion Gap: 5 — ABNORMAL LOW (ref 7–16)
BUN: 37 mg/dL — ABNORMAL HIGH (ref 7–18)
Calcium, Total: 9 mg/dL (ref 8.5–10.1)
Chloride: 110 mmol/L — ABNORMAL HIGH (ref 98–107)
Co2: 24 mmol/L (ref 21–32)
EGFR (African American): >60
EGFR (Non-African Amer.): >60
Glucose: 129 mg/dL — ABNORMAL HIGH (ref 65–99)
Osmolality: 288 (ref 275–301)
Potassium: 3.5 mmol/L (ref 3.5–5.1)

## 2013-08-03 LAB — CBC WITH DIFFERENTIAL/PLATELET
Basophil #: 0 10*3/uL (ref 0.0–0.1)
Basophil %: 0.2 %
Eosinophil %: 1.2 %
HCT: 30.4 % — ABNORMAL LOW (ref 35.0–47.0)
Lymphocyte %: 31.7 %
MCHC: 32.8 g/dL (ref 32.0–36.0)
Monocyte #: 0.9 x10 3/mm (ref 0.2–0.9)
Monocyte %: 6.8 %
Neutrophil #: 7.7 10*3/uL — ABNORMAL HIGH (ref 1.4–6.5)
Platelet: 179 10*3/uL (ref 150–440)
RBC: 3.26 10*6/uL — ABNORMAL LOW (ref 3.80–5.20)
RDW: 12.9 % (ref 11.5–14.5)

## 2013-08-03 LAB — EXPECTORATED SPUTUM ASSESSMENT W GRAM STAIN, RFLX TO RESP C

## 2013-08-04 ENCOUNTER — Ambulatory Visit: Payer: Federal, State, Local not specified - PPO | Admitting: Internal Medicine

## 2013-08-05 LAB — CBC WITH DIFFERENTIAL/PLATELET
Basophil #: 0 10*3/uL (ref 0.0–0.1)
Basophil %: 0.1 %
Eosinophil %: 0.4 %
HCT: 29 % — ABNORMAL LOW (ref 35.0–47.0)
HGB: 9.6 g/dL — ABNORMAL LOW (ref 12.0–16.0)
Lymphocyte %: 18 %
MCH: 31 pg (ref 26.0–34.0)
MCHC: 33 g/dL (ref 32.0–36.0)
Platelet: 173 10*3/uL (ref 150–440)
RDW: 13.3 % (ref 11.5–14.5)
WBC: 11.5 10*3/uL — ABNORMAL HIGH (ref 3.6–11.0)

## 2013-08-05 LAB — BASIC METABOLIC PANEL
BUN: 33 mg/dL — ABNORMAL HIGH (ref 7–18)
Creatinine: 0.98 mg/dL (ref 0.60–1.30)
EGFR (African American): >60
EGFR (Non-African Amer.): 58 — ABNORMAL LOW
Glucose: 104 mg/dL — ABNORMAL HIGH (ref 65–99)
Osmolality: 289 (ref 275–301)
Potassium: 3.9 mmol/L (ref 3.5–5.1)

## 2013-08-06 LAB — BASIC METABOLIC PANEL
BUN: 36 mg/dL — ABNORMAL HIGH (ref 7–18)
Calcium, Total: 9.4 mg/dL (ref 8.5–10.1)
Chloride: 110 mmol/L — ABNORMAL HIGH (ref 98–107)
Co2: 24 mmol/L (ref 21–32)
Creatinine: 0.96 mg/dL (ref 0.60–1.30)
EGFR (African American): >60
EGFR (Non-African Amer.): 60 — ABNORMAL LOW
Glucose: 114 mg/dL — ABNORMAL HIGH (ref 65–99)
Osmolality: 289 (ref 275–301)
Sodium: 140 mmol/L (ref 136–145)

## 2013-08-07 LAB — BASIC METABOLIC PANEL
Anion Gap: 8 (ref 7–16)
Calcium, Total: 9.5 mg/dL (ref 8.5–10.1)
Chloride: 112 mmol/L — ABNORMAL HIGH (ref 98–107)
EGFR (African American): >60
Glucose: 90 mg/dL (ref 65–99)
Osmolality: 293 (ref 275–301)
Sodium: 145 mmol/L (ref 136–145)

## 2013-08-08 LAB — CBC WITH DIFFERENTIAL/PLATELET
Basophil #: 0 10*3/uL (ref 0.0–0.1)
Basophil %: 0.3 %
Eosinophil #: 0 10*3/uL (ref 0.0–0.7)
Eosinophil %: 0.3 %
HCT: 31.1 % — ABNORMAL LOW (ref 35.0–47.0)
HGB: 10.1 g/dL — ABNORMAL LOW (ref 12.0–16.0)
Lymphocyte %: 28.2 %
Monocyte #: 1.2 x10 3/mm — ABNORMAL HIGH (ref 0.2–0.9)
Neutrophil #: 5.8 10*3/uL (ref 1.4–6.5)
Neutrophil %: 59 %
RBC: 3.3 10*6/uL — ABNORMAL LOW (ref 3.80–5.20)
WBC: 9.8 10*3/uL (ref 3.6–11.0)

## 2013-08-08 LAB — BASIC METABOLIC PANEL
Anion Gap: 6 — ABNORMAL LOW (ref 7–16)
BUN: 25 mg/dL — ABNORMAL HIGH (ref 7–18)
Calcium, Total: 9.8 mg/dL (ref 8.5–10.1)
Creatinine: 0.82 mg/dL (ref 0.60–1.30)
EGFR (African American): >60
EGFR (Non-African Amer.): >60
Potassium: 3 mmol/L — ABNORMAL LOW (ref 3.5–5.1)

## 2013-08-09 LAB — BASIC METABOLIC PANEL
Anion Gap: 5 — ABNORMAL LOW (ref 7–16)
Calcium, Total: 10.3 mg/dL — ABNORMAL HIGH (ref 8.5–10.1)
Creatinine: 0.83 mg/dL (ref 0.60–1.30)
EGFR (African American): >60
Osmolality: 291 (ref 275–301)
Potassium: 3.6 mmol/L (ref 3.5–5.1)
Sodium: 144 mmol/L (ref 136–145)

## 2013-08-11 ENCOUNTER — Encounter: Payer: Self-pay | Admitting: Internal Medicine

## 2013-08-11 LAB — CBC WITH DIFFERENTIAL/PLATELET
Basophil %: 0.7 %
Eosinophil #: 0.2 10*3/uL (ref 0.0–0.7)
Lymphocyte #: 3.3 10*3/uL (ref 1.0–3.6)
Lymphocyte %: 31.6 %
MCHC: 33.1 g/dL (ref 32.0–36.0)
Monocyte #: 1 x10 3/mm — ABNORMAL HIGH (ref 0.2–0.9)
Monocyte %: 9.6 %
Neutrophil #: 5.9 10*3/uL (ref 1.4–6.5)
Platelet: 261 10*3/uL (ref 150–440)
RBC: 3.69 10*6/uL — ABNORMAL LOW (ref 3.80–5.20)
RDW: 13.7 % (ref 11.5–14.5)
WBC: 10.5 10*3/uL (ref 3.6–11.0)

## 2013-08-15 ENCOUNTER — Other Ambulatory Visit: Payer: Self-pay | Admitting: Internal Medicine

## 2013-08-20 ENCOUNTER — Ambulatory Visit: Payer: Federal, State, Local not specified - PPO | Admitting: Internal Medicine

## 2013-08-20 ENCOUNTER — Other Ambulatory Visit: Payer: Self-pay | Admitting: Internal Medicine

## 2013-08-21 ENCOUNTER — Encounter: Payer: Self-pay | Admitting: Internal Medicine

## 2013-09-01 ENCOUNTER — Encounter: Payer: Self-pay | Admitting: *Deleted

## 2013-09-01 ENCOUNTER — Encounter: Payer: Self-pay | Admitting: Internal Medicine

## 2013-09-01 ENCOUNTER — Ambulatory Visit (INDEPENDENT_AMBULATORY_CARE_PROVIDER_SITE_OTHER): Payer: Federal, State, Local not specified - PPO | Admitting: Internal Medicine

## 2013-09-01 ENCOUNTER — Other Ambulatory Visit: Payer: Self-pay | Admitting: Internal Medicine

## 2013-09-01 ENCOUNTER — Encounter: Payer: Self-pay | Admitting: Emergency Medicine

## 2013-09-01 ENCOUNTER — Ambulatory Visit: Payer: Self-pay | Admitting: Internal Medicine

## 2013-09-01 VITALS — BP 122/56 | HR 76 | Temp 98.6°F | Resp 14

## 2013-09-01 DIAGNOSIS — A481 Legionnaires' disease: Secondary | ICD-10-CM | POA: Insufficient documentation

## 2013-09-01 DIAGNOSIS — G4733 Obstructive sleep apnea (adult) (pediatric): Secondary | ICD-10-CM

## 2013-09-01 DIAGNOSIS — R509 Fever, unspecified: Secondary | ICD-10-CM

## 2013-09-01 DIAGNOSIS — IMO0002 Reserved for concepts with insufficient information to code with codable children: Secondary | ICD-10-CM

## 2013-09-01 DIAGNOSIS — R05 Cough: Secondary | ICD-10-CM

## 2013-09-01 DIAGNOSIS — M1711 Unilateral primary osteoarthritis, right knee: Secondary | ICD-10-CM

## 2013-09-01 DIAGNOSIS — Z9989 Dependence on other enabling machines and devices: Secondary | ICD-10-CM

## 2013-09-01 DIAGNOSIS — Z79899 Other long term (current) drug therapy: Secondary | ICD-10-CM

## 2013-09-01 DIAGNOSIS — Z87891 Personal history of nicotine dependence: Secondary | ICD-10-CM

## 2013-09-01 DIAGNOSIS — M1710 Unilateral primary osteoarthritis, unspecified knee: Secondary | ICD-10-CM

## 2013-09-01 DIAGNOSIS — R059 Cough, unspecified: Secondary | ICD-10-CM

## 2013-09-01 DIAGNOSIS — Z8701 Personal history of pneumonia (recurrent): Secondary | ICD-10-CM

## 2013-09-01 LAB — COMPREHENSIVE METABOLIC PANEL
ALT: 20 U/L (ref 0–35)
AST: 23 U/L (ref 0–37)
Albumin: 3.1 g/dL — ABNORMAL LOW (ref 3.5–5.2)
Alkaline Phosphatase: 84 U/L (ref 39–117)
BUN: 17 mg/dL (ref 6–23)
CHLORIDE: 108 meq/L (ref 96–112)
CO2: 26 meq/L (ref 19–32)
Calcium: 9.7 mg/dL (ref 8.4–10.5)
Creatinine, Ser: 1.1 mg/dL (ref 0.4–1.2)
GFR: 53.79 mL/min — ABNORMAL LOW (ref >60.00)
Glucose, Bld: 95 mg/dL (ref 70–99)
POTASSIUM: 4.5 meq/L (ref 3.5–5.1)
SODIUM: 141 meq/L (ref 135–145)
TOTAL PROTEIN: 5.5 g/dL — AB (ref 6.0–8.3)
Total Bilirubin: 0.7 mg/dL (ref 0.3–1.2)

## 2013-09-01 LAB — CBC WITH DIFFERENTIAL/PLATELET
Basophils Absolute: 0 10*3/uL (ref 0.0–0.1)
Basophils Relative: 0.3 % (ref 0.0–3.0)
EOS ABS: 0.1 10*3/uL (ref 0.0–0.7)
Eosinophils Relative: 1.9 % (ref 0.0–5.0)
HCT: 30.9 % — ABNORMAL LOW (ref 36.0–46.0)
Hemoglobin: 10.3 g/dL — ABNORMAL LOW (ref 12.0–15.0)
Lymphocytes Relative: 17.5 % (ref 12.0–46.0)
Lymphs Abs: 1 10*3/uL (ref 0.7–4.0)
MCHC: 33.3 g/dL (ref 30.0–36.0)
MCV: 94.3 fl (ref 78.0–100.0)
MONOS PCT: 10.8 % (ref 3.0–12.0)
Monocytes Absolute: 0.6 10*3/uL (ref 0.1–1.0)
NEUTROS PCT: 69.5 % (ref 43.0–77.0)
Neutro Abs: 4.1 10*3/uL (ref 1.4–7.7)
Platelets: 141 10*3/uL — ABNORMAL LOW (ref 150.0–400.0)
RBC: 3.27 Mil/uL — ABNORMAL LOW (ref 3.87–5.11)
RDW: 14.4 % (ref 11.5–14.6)
WBC: 5.9 10*3/uL (ref 4.5–10.5)

## 2013-09-01 MED ORDER — OXYCODONE-ACETAMINOPHEN 10-325 MG PO TABS: 1.0000 | ORAL_TABLET | Freq: Four times a day (QID) | ORAL | Status: DC | PRN

## 2013-09-01 MED ORDER — METHYLPREDNISOLONE ACETATE 40 MG/ML IJ SUSP
40.0000 mg | Freq: Once | INTRAMUSCULAR | Status: AC
  Administered 2013-09-01: 40 mg via INTRAMUSCULAR

## 2013-09-01 MED ORDER — LEVOFLOXACIN 750 MG PO TABS: 750.0000 mg | ORAL_TABLET | Freq: Every day | ORAL | Status: DC

## 2013-09-01 MED ORDER — PREDNISONE 10 MG PO TABS: ORAL_TABLET | ORAL | Status: DC

## 2013-09-01 NOTE — Assessment & Plan Note (Signed)
Diagnosed by urine Assay during Dec 2014 hospitalization for ARDS. Repeating test and chest x ray,  Empiric levaquin for cough with fever last evening.  Referral to Dr Lake Bells for pulmonary follow up.

## 2013-09-01 NOTE — Assessment & Plan Note (Signed)
Given recurrence with wheezing and fever, adding steroids and empiric high dose levaquin pending eval of chest x ray

## 2013-09-01 NOTE — Progress Notes (Signed)
Patient ID: [name], female   DOB: 03-26-1941, 73 y.o.   MRN: [id number]  Patient Active Problem List   Diagnosis Date Noted  . History of bacterial pneumonia 09/01/2013  . 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 History of tobacco abuse 09/01/2013  . Other and unspecified hyperlipidemia 03/28/2013  . Preoperative clearance 03/28/2013  . Routine general medical examination at a health care facility 03/27/2013  . OSA on CPAP 03/27/2013  . Cough 03/13/2013  . Insomnia due to anxiety and fear 03/13/2013  . Unspecified constipation 03/13/2013  . Allergic rhinitis 03/13/2013  . Chronic nausea 12/02/2012  . Obesity (BMI 30-39.9) 03/10/2012  . Monoclonal gammopathy of undetermined significance 03/10/2012  . 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 Unspecified hypothyroidism 01/01/2012  . Osteoarthritis of right knee 07/12/2011  . Brain aneurysm 04/18/2011  . Chronic back pain greater than 3 months duration 04/18/2011  . Breast screening, unspecified 04/18/2011  . Hypertension 04/07/2011    Subjective:  CC:   Chief Complaint  Patient presents with  . Follow-up    for hospital    HPI:   [name] a 73 y.o. female who presents Hospital follow up .  patient admitted to [facility] on Dec 6 after failing outpatient therapy started on Dec 4 for bronchitis with albuterol and doxycycline. Patient apparently rapidly deteriorated on trial of BIPAP and was intubated for  ARDS secondary to Legionella pneumonia.  She failed to wean on 3 trials  and was extubated to comfort care but survived and actually improved.  She was finally trransferred to [facility] for ehab on Dec 22nd .  Came home last Wednesday..  Was feeling good until last evening when she developed a fever.  She has had a persistent dry cough since discharge. Not short of breath with exrtion.  Wheezing today,  Had a fever yesterday to 101 ,  Feels like crap but still feels better than she did in October. Has not had a chest x  ray since DC  Has not smoked since hospitalization .  (had started smoking  Again 6 months ago.)  Cleans her CPAP tubing weekly and discards every 3 weeks. CPAP machine is < 6 months old per patient.  Cleans it with ivory soap and distilled water at least once a week .      Using CPAP with 02 concentrator at night and with naps . Marland Kitchen  Using advair and spiriva as directed.  Used the albuterol puffer last night. Not short of breath with walking around the house. Using tessalon bid since discharge and      Past Medical History  Diagnosis Date  . Degenerative disc disease     HERNITAED DISC BY MRI ON 07/07;S/P LAMINECTOMY,RID REPLACEMENT  . Brain aneurysm     S/P RESECTION  . Discoid lupus erythematosus eyelid     NEGATIVE SEROLOGIC MARKERS  . Hypercholesterolemia   . Chronic sinusitis   . Hypertension   . Dysrhythmia     PVC's occ.  . Pneumonia 05/30/2010    after receiving Propofol  . Headache(784.0)     migraines occ.  . CHF (congestive heart failure) 05/2010    after receiving Propofol  . Hypothyroidism     takes Synthroid  . Blood transfusion 2009    back surgery  . Asthma 06/2010    depending on what around  . Complication of anesthesia 05/30/2010    from colonoscopy-had Propofol-had a  . Complication of anesthesia  reaction-went into asthma-pneumonia,-  . Complication of anesthesia     then CHF and to ICU  . Sleep apnea     uses CPAP-8.4-oxygen suppliment    Past Surgical History  Procedure Laterality Date  . Cosmetic surgery    . Tonsillectomy  1963  . Back surgery  1968,1988,2002,2009    4 laminectomies and fusions  . Abdominal hysterectomy  1988    uterus only  . Brain surgery  2005    coiling of cranial aneurysum  . Excision morton's neuroma  1990    left foot  . Nasal sinus surgery  1977-2008  . Lipomas  1975    3 from right breast  . Tympanic membrane repair  2009-last one    x5 times  . Total knee arthroplasty  07/10/2011    Procedure: TOTAL  KNEE ARTHROPLASTY;  Surgeon: Gearlean Alf;  Location: [facility];  Service: Orthopedics;  Laterality: Right;  . Joint replacement  2013    Right knee,  Alucio  . Eye surgery      both cataracts -[place] 7/14  . Foot arthrodesis Right 05/22/2013    Procedure: RIGHT HALLUX METATARSAL PHALANGEAL JOINT ARTHRODESIS ;  Surgeon: Wylene Simmer, MD;  Location: [place];  Service: Orthopedics;  Laterality: Right;       The following portions of the patient's history were reviewed and updated as appropriate: Allergies, current medications, and problem list.    Review of Systems:   12 Pt  review of systems was negative except those addressed in the HPI,     History   Social History  . Marital Status: Married    Spouse Name: N/A    Number of Children: N/A  . Years of Education: N/A   Occupational History  . Not on file.   Social History Main Topics  . Smoking status: Former Smoker -- 0.50 packs/day for 40 years    Types: Cigarettes    Quit date: 07/24/2013  . Smokeless tobacco: Never Used     Comment: REMOTELY QUIT  TOBACCO USE  . Alcohol Use: Yes     Comment: occasional  . Drug Use: No  . Sexual Activity: Not on file   Other Topics Concern  . Not on file   Social History Narrative   Light exercise   LIVES WITH SPOUSE          Objective:  Filed Vitals:   09/01/13 0959  BP: 122/56  Pulse: 76  Temp: 98.6 F (37 C)  Resp: 14     General appearance: alert, cooperative and appears stated age Ears: normal TM's and external ear canals both ears Throat: lips, mucosa, and tongue normal; teeth and gums normal Neck: no adenopathy, no carotid bruit, supple, symmetrical, trachea midline and thyroid not enlarged, symmetric, no tenderness/mass/nodules Back: symmetric, no curvature. ROM normal. No CVA tenderness. Lungs: clear to auscultation bilaterally. Wheezing at end expiration Heart: regular rate and rhythm, S1, S2 normal, no murmur, click, rub or  gallop Abdomen: soft, non-tender; bowel sounds normal; no masses,  no organomegaly Pulses: 2+ and symmetric Skin: Skin color, texture, turgor normal. No rashes or lesions Lymph nodes: Cervical, supraclavicular, and axillary nodes normal.  Assessment and Plan:  OSA on CPAP Her CPAP machien may have been the source of Legionella.  It is < 6 months old per patient .  Clean tubing with alcohol and distilled water or sterile saline.   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 Repeating test and chest x ray,  Empiric levaquin for cough with fever last evening.  Referral to Dr Lake Bells for pulmonary follow up.   History of tobacco abuse Patient had resume smoking 6 months ago but quit on Dec 6 when she was hospitalized with ARDS secondary to Legionella.   History of bacterial pneumonia Legionella by urine ag done during recent hospital admission  for ARDs.     Cough Given recurrence with wheezing and fever, adding steroids and empiric high dose levaquin pending eval of chest x ray   A total of 40 minutes was spent with patient more than half of which was spent in counseling, reviewing records from other prviders and coordination of care.  Updated Medication List Outpatient Encounter Prescriptions as of 09/01/2013  Medication Sig  . albuterol (PROVENTIL HFA;VENTOLIN HFA) 108 (90 BASE) MCG/ACT inhaler Inhale 2 puffs into the lungs every 6 (six) hours as needed for wheezing or shortness of breath.  Marland Kitchen albuterol (PROVENTIL) (2.5 MG/3ML) 0.083% nebulizer solution Take 3 mLs (2.5 mg total) by nebulization every 6 (six) hours as needed for wheezing or shortness of breath.  . AMBIEN 5 MG tablet TAKE 1 TABLET AT BEDTIME  . amLODipine (NORVASC) 10 MG tablet TAKE 1 TABLET (10 MG TOTAL) BY MOUTH DAILY.  . benzonatate (TESSALON) 100 MG capsule TAKE 1 CAPSULE (100 MG TOTAL) BY MOUTH EVERY 4 (FOUR) HOURS.  . budesonide-formoterol (SYMBICORT)  160-4.5 MCG/ACT inhaler Inhale 2 puffs into the lungs 2 (two) times daily.  Marland Kitchen BYSTOLIC 5 MG tablet TAKE 1 TABLET BY MOUTH EVERY DAY  . CLARINEX 5 MG tablet TAKE 1 TABLET (5 MG TOTAL) BY MOUTH EVERY MORNING.  . cyanocobalamin (,VITAMIN B-12,) 1000 MCG/ML injection Inject 1 mL (1,000 mcg total) into the muscle every 30 (thirty) days.  . diazepam (VALIUM) 5 MG tablet TAKE 1 TABLET TWICE DAILY  . doxepin (SINEQUAN) 25 MG capsule Take 2 capsules (50 mg total) by mouth at bedtime.  Marland Kitchen escitalopram (LEXAPRO) 10 MG tablet TAKE 1 TABLET (10 MG TOTAL) BY MOUTH EVERY MORNING.  . FeFum-FePo-FA-B Cmp-C-Zn-Mn-Cu (TANDEM PLUS) 162-115.2-1 MG CAPS TAKE 1 TABLET BY MOUTH DAILY.  Marland Kitchen Fluticasone-Salmeterol (ADVAIR) 100-50 MCG/DOSE AEPB Inhale 1 puff into the lungs 2 (two) times daily.  . isosorbide mononitrate (IMDUR) 60 MG 24 hr tablet TAKE ONE BY MOUTH DAILY  . LASIX 20 MG tablet TAKE 1 TO 2 TABLETS DAILY AS NEEDED FOR FLUID RETENTION  . lidocaine (LIDODERM) 5 % PLACE 3 PATCHES ONTO THE SKIN DAILY. REMOVE & DISCARD PATCH WITHIN 12 HOURS OR AS DIRECTED BY MD  . losartan (COZAAR) 50 MG tablet Take 1 tablet (50 mg total) by mouth 2 (two) times daily.  . montelukast (SINGULAIR) 10 MG tablet TAKE 1 TABLET (10 MG TOTAL) BY MOUTH AT BEDTIME.  Marland Kitchen NEEDLE, DISP, 23 G (BD DISP NEEDLE) 23G X 1" MISC Uses to inject b12 into muscle once monthly  . omega-3 acid ethyl esters (LOVAZA) 1 G capsule TAKE 2 CAPSULES TWICE A DAY  . oxyCODONE (ROXICODONE) 5 MG immediate release tablet Take 1-2 tablets (5-10 mg total) by mouth every 4 (four) hours as needed for pain.  Marland Kitchen oxyCODONE-acetaminophen (PERCOCET) 10-325 MG per tablet Take 1 tablet by mouth every 6 (six) hours as needed.  . rosuvastatin (CRESTOR) 20 MG tablet TAKE 1 TABLET (20 MG TOTAL) BY MOUTH AT BEDTIME.  . sodium chloride (OCEAN) 0.65 % nasal spray Place 2 sprays into the nose as needed. Allergies    .  SYNTHROID 150 MCG tablet TAKE 1 TABLET (150 MCG TOTAL) BY MOUTH DAILY.  .  [DISCONTINUED] oxyCODONE-acetaminophen (PERCOCET) 10-325 MG per tablet Take 1 tablet by mouth every 6 (six) hours as needed.  . doxycycline (VIBRA-TABS) 100 MG tablet Take 1 tablet (100 mg total) by mouth 2 (two) times daily.  Marland Kitchen FeFum-FePo-FA-B Cmp-C-Zn-Mn-Cu (SE-TAN PLUS) 162-115.2-1 MG CAPS TAKE 1 TABLET BY MOUTH DAILY.  Marland Kitchen levofloxacin (LEVAQUIN) 750 MG tablet Take 1 tablet (750 mg total) by mouth daily.  . nebivolol (BYSTOLIC) 5 MG tablet Take 1 tablet (5 mg total) by mouth daily.  . predniSONE (DELTASONE) 10 MG tablet 6 tablets twice daily for 3 days, then 6 tablets once daily and  begin to taper daily by 1 tablet until gone

## 2013-09-01 NOTE — Assessment & Plan Note (Signed)
Legionella by urine ag done during recent hospital admission  for ARDs.

## 2013-09-01 NOTE — Assessment & Plan Note (Signed)
Her CPAP machien may have been the source of Legionella.  It is < 6 months old per patient .  Clean tubing with alcohol and distilled water or sterile saline.

## 2013-09-01 NOTE — Progress Notes (Unsigned)
Patient ID: [name], female   DOB: 17-Nov-1940, 73 y.o.   MRN: [id number]

## 2013-09-01 NOTE — Addendum Note (Signed)
Addended by: Nanci Pina on: 09/01/2013 06:55 PM   Modules accepted: Orders

## 2013-09-01 NOTE — Progress Notes (Signed)
Pre-visit discussion using our clinic review tool. No additional management support is needed unless otherwise documented below in the visit note.  

## 2013-09-01 NOTE — Assessment & Plan Note (Signed)
Patient had resume smoking 6 months ago but quit on Dec 6 when she was hospitalized with ARDS secondary to Legionella.

## 2013-09-01 NOTE — Progress Notes (Unsigned)
Tried to reach patient today for hospital follow up no answer.

## 2013-09-01 NOTE — Patient Instructions (Addendum)
I amn putting you back on levaquin because of the fever and cough,  And starting you on a sterodi taper for the wheezing.  Start the levaquin today  I am repeating your chest x ray and labs today.   Use the albuterol nebulizer instead of the albuterol MDI when you are home  Clean your CPAP tubing with alcohol and sterile water or saline  If you get worse,  Go immediately to the ER   I am setting you up to see our pulmonologist.

## 2013-09-02 ENCOUNTER — Telehealth: Payer: Self-pay | Admitting: Internal Medicine

## 2013-09-02 ENCOUNTER — Encounter: Payer: Self-pay | Admitting: Internal Medicine

## 2013-09-02 LAB — LEGIONELLA ANTIGEN, URINE: Result - LGAGUR: NEGATIVE

## 2013-09-02 MED ORDER — FLUCONAZOLE 150 MG PO TABS: 150.0000 mg | ORAL_TABLET | Freq: Every day | ORAL | Status: DC

## 2013-09-02 MED ORDER — ALBUTEROL SULFATE (2.5 MG/3ML) 0.083% IN NEBU: 2.5000 mg | INHALATION_SOLUTION | Freq: Four times a day (QID) | RESPIRATORY_TRACT | Status: DC | PRN

## 2013-09-02 NOTE — Telephone Encounter (Signed)
Seeing her once a week x1 week beginning 1/9 Will see 3x  week for 2 weeks, this week and next week. Will see 2x week 1 wk Then will discharge.   Will be working on aerobic training and strengthening.  Touch up on stair use and gait.

## 2013-09-02 NOTE — Telephone Encounter (Signed)
Patient would like script for Diflucan as prophylaxis for yeast with ABX prescribed 09/01/13

## 2013-09-02 NOTE — Telephone Encounter (Signed)
Script placed up front for percocet for patient to pick up and patient notified.

## 2013-09-02 NOTE — Telephone Encounter (Signed)
States when diflucan is called in she also needs albuterol.  States her albuterol is expired and she needs it for her nebulizer.

## 2013-09-02 NOTE — Telephone Encounter (Signed)
meds sent to [facility]

## 2013-09-08 ENCOUNTER — Encounter: Payer: Self-pay | Admitting: Internal Medicine

## 2013-09-08 ENCOUNTER — Ambulatory Visit (INDEPENDENT_AMBULATORY_CARE_PROVIDER_SITE_OTHER): Payer: Federal, State, Local not specified - PPO | Admitting: Internal Medicine

## 2013-09-08 VITALS — BP 160/64 | HR 69 | Temp 98.4°F | Resp 18 | Wt 193.2 lb

## 2013-09-08 DIAGNOSIS — R110 Nausea: Secondary | ICD-10-CM

## 2013-09-08 DIAGNOSIS — B370 Candidal stomatitis: Secondary | ICD-10-CM

## 2013-09-08 DIAGNOSIS — Z8701 Personal history of pneumonia (recurrent): Secondary | ICD-10-CM

## 2013-09-08 DIAGNOSIS — Z23 Encounter for immunization: Secondary | ICD-10-CM

## 2013-09-08 MED ORDER — FLUCONAZOLE 150 MG PO TABS: 150.0000 mg | ORAL_TABLET | Freq: Every day | ORAL | Status: DC

## 2013-09-08 NOTE — Progress Notes (Signed)
Pre-visit discussion using our clinic review tool. No additional management support is needed unless otherwise documented below in the visit note.  

## 2013-09-08 NOTE — Progress Notes (Signed)
Patient ID: [name], female   DOB: [birth year], 73 y.o.   MRN: [id number]   Patient Active Problem List   Diagnosis Date Noted  . Thrush, oral 09/09/2013  . History of bacterial pneumonia 09/01/2013  . 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 History of tobacco abuse 09/01/2013  . Other and unspecified hyperlipidemia 03/28/2013  . Preoperative clearance 03/28/2013  . Routine general medical examination at a health care facility 03/27/2013  . OSA on CPAP 03/27/2013  . Cough 03/13/2013  . Insomnia due to anxiety and fear 03/13/2013  . Unspecified constipation 03/13/2013  . Allergic rhinitis 03/13/2013  . Chronic nausea 12/02/2012  . Obesity (BMI 30-39.9) 03/10/2012  . Monoclonal gammopathy of undetermined significance 03/10/2012  . 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 Unspecified hypothyroidism 01/01/2012  . Osteoarthritis of right knee 07/12/2011  . Brain aneurysm 04/18/2011  . Chronic back pain greater than 3 months duration 04/18/2011  . Breast screening, unspecified 04/18/2011  . Hypertension 04/07/2011    Subjective:  CC:   Chief Complaint  Patient presents with  . Follow-up    1 week c/o pain under left scapula    HPI:   [name] a 73 y.o. female who presents ollow up on bronchitis.  She was treated last week with Levaquin and prednisone for recurrence of productive cough in the setting of recent hospitalization for ARDS requiring mechanical ventilation. Her pneumonia initially was diagnosed as legionnaire's disease. Repeat Legionella antigen last week was negative. Chest x-ray did not show any new infiltrates. She states that she has continued to run low-grade fevers to 100.4 until yesterday. Her cough has improved. She is tolerating the Levaquin and prednisone but having some tremulousness due to the prednisone. She is using her Symbicort inhaler and albuterol nebulizers as directed. She has some question as to how often she should be using the  albuterol. She continued to have thrush despite use of nystatin swish and swallow. Is too weak to comply with the home health physical therapist wanted her to start using it a stationery bike for 10 minutes at a time which she felt was excessive. He is requesting postpone and physical therapy for another week.   Past Medical History  Diagnosis Date  . Degenerative disc disease     HERNITAED DISC BY MRI ON 07/07;S/P LAMINECTOMY,RID REPLACEMENT  . Brain aneurysm     S/P RESECTION  . Discoid lupus erythematosus eyelid     NEGATIVE SEROLOGIC MARKERS  . Hypercholesterolemia   . Chronic sinusitis   . Hypertension   . Dysrhythmia     PVC's occ.  . Pneumonia 05/30/2010    after receiving Propofol  . Headache(784.0)     migraines occ.  . CHF (congestive heart failure) 05/2010    after receiving Propofol  . Hypothyroidism     takes Synthroid  . Blood transfusion 2009    back surgery  . Asthma 06/2010    depending on what around  . Complication of anesthesia 05/30/2010    from colonoscopy-had Propofol-had a  . Complication of anesthesia     reaction-went into asthma-pneumonia,-  . Complication of anesthesia     then CHF and to ICU  . Sleep apnea     uses CPAP-8.4-oxygen suppliment    Past Surgical History  Procedure Laterality Date  . Cosmetic surgery    . Tonsillectomy  1963  . Back surgery  1968,1988,2002,2009    4 laminectomies and fusions  . Abdominal hysterectomy  1988  uterus only  . Brain surgery  2005    coiling of cranial aneurysum  . Excision morton's neuroma  1990    left foot  . Nasal sinus surgery  1977-2008  . Lipomas  1975    3 from right breast  . Tympanic membrane repair  2009-last one    x5 times  . Total knee arthroplasty  07/10/2011    Procedure: TOTAL KNEE ARTHROPLASTY;  Surgeon: Gearlean Alf;  Location: [facility];  Service: Orthopedics;  Laterality: Right;  . Joint replacement  2013    Right knee,  Alucio  . Eye surgery      both cataracts  -[place] 7/14  . Foot arthrodesis Right 05/22/2013    Procedure: RIGHT HALLUX METATARSAL PHALANGEAL JOINT ARTHRODESIS ;  Surgeon: Wylene Simmer, MD;  Location: [place];  Service: Orthopedics;  Laterality: Right;       The following portions of the patient's history were reviewed and updated as appropriate: Allergies, current medications, and problem list.    Review of Systems:   12 Pt  review of systems was negative except those addressed in the HPI,     History   Social History  . Marital Status: Married    Spouse Name: N/A    Number of Children: N/A  . Years of Education: N/A   Occupational History  . Not on file.   Social History Main Topics  . Smoking status: Former Smoker -- 0.50 packs/day for 40 years    Types: Cigarettes    Quit date: 07/24/2013  . Smokeless tobacco: Never Used     Comment: REMOTELY QUIT  TOBACCO USE  . Alcohol Use: Yes     Comment: occasional  . Drug Use: No  . Sexual Activity: Not on file   Other Topics Concern  . Not on file   Social History Narrative   Light exercise   LIVES WITH SPOUSE          Objective:  Filed Vitals:   09/08/13 1735  BP: 160/64  Pulse: 69  Temp: 98.4 F (36.9 C)  Resp: 18     General appearance: alert, cooperative and appears stated age Ears: normal TM's and external ear canals both ears Throat: lips, mucosa, and tongue normal; teeth and gums normal Neck: no adenopathy, no carotid bruit, supple, symmetrical, trachea midline and thyroid not enlarged, symmetric, no tenderness/mass/nodules Back: symmetric, no curvature. ROM normal. No CVA tenderness. Lungs: clear to auscultation bilaterally Heart: regular rate and rhythm, S1, S2 normal, no murmur, click, rub or gallop Abdomen: soft, non-tender; bowel sounds normal; no masses,  no organomegaly Pulses: 2+ and symmetric Skin: Skin color, texture, turgor normal. No rashes or lesions Lymph nodes: Cervical, supraclavicular, and axillary nodes  normal.  Assessment and Plan:  History of bacterial pneumonia She was retreated for bronchitis versus pneumonia last week empirically when she presented for hospital followup of productive cough and fevers. No antigen was negative, chest x-ray was unchanged. Symptoms are improving. Symptoms were most likely viral given her continued low-grade fevers until yesterday. We will repeat her chest x-ray 6 weeks. Continues Symbicort to 2 puffs bid twice daily tobacco cessation and albuterol when necessary.  Thrush, oral She is requesting a second dose of fluconazole. I've asked her to suspend her Crestor for she does this.   Updated Medication List Outpatient Encounter Prescriptions as of 09/08/2013  Medication Sig  . albuterol (PROVENTIL) (2.5 MG/3ML) 0.083% nebulizer solution Take 3 mLs (2.5 mg total) by nebulization  every 6 (six) hours as needed for wheezing or shortness of breath.  Marland Kitchen albuterol (PROVENTIL) (2.5 MG/3ML) 0.083% nebulizer solution Take 3 mLs (2.5 mg total) by nebulization every 6 (six) hours as needed for wheezing or shortness of breath.  . AMBIEN 5 MG tablet TAKE 1 TABLET AT BEDTIME  . amLODipine (NORVASC) 10 MG tablet TAKE 1 TABLET (10 MG TOTAL) BY MOUTH DAILY.  . benzonatate (TESSALON) 100 MG capsule TAKE 1 CAPSULE (100 MG TOTAL) BY MOUTH EVERY 4 (FOUR) HOURS.  . budesonide-formoterol (SYMBICORT) 160-4.5 MCG/ACT inhaler Inhale 2 puffs into the lungs 2 (two) times daily.  Marland Kitchen BYSTOLIC 5 MG tablet TAKE 1 TABLET BY MOUTH EVERY DAY  . CLARINEX 5 MG tablet TAKE 1 TABLET (5 MG TOTAL) BY MOUTH EVERY MORNING.  . cyanocobalamin (,VITAMIN B-12,) 1000 MCG/ML injection Inject 1 mL (1,000 mcg total) into the muscle every 30 (thirty) days.  . diazepam (VALIUM) 5 MG tablet TAKE 1 TABLET TWICE DAILY  . doxepin (SINEQUAN) 25 MG capsule Take 2 capsules (50 mg total) by mouth at bedtime.  Marland Kitchen escitalopram (LEXAPRO) 10 MG tablet TAKE 1 TABLET (10 MG TOTAL) BY MOUTH EVERY MORNING.  . FeFum-FePo-FA-B  Cmp-C-Zn-Mn-Cu (SE-TAN PLUS) 162-115.2-1 MG CAPS TAKE 1 TABLET BY MOUTH DAILY.  Marland Kitchen FeFum-FePo-FA-B Cmp-C-Zn-Mn-Cu (TANDEM PLUS) 162-115.2-1 MG CAPS TAKE 1 TABLET BY MOUTH DAILY.  . fluconazole (DIFLUCAN) 150 MG tablet Take 1 tablet (150 mg total) by mouth daily.  . Fluticasone-Salmeterol (ADVAIR) 100-50 MCG/DOSE AEPB Inhale 1 puff into the lungs 2 (two) times daily.  . isosorbide mononitrate (IMDUR) 60 MG 24 hr tablet TAKE ONE BY MOUTH DAILY  . levofloxacin (LEVAQUIN) 750 MG tablet Take 1 tablet (750 mg total) by mouth daily.  Marland Kitchen lidocaine (LIDODERM) 5 % PLACE 3 PATCHES ONTO THE SKIN DAILY. REMOVE & DISCARD PATCH WITHIN 12 HOURS OR AS DIRECTED BY MD  . losartan (COZAAR) 50 MG tablet Take 1 tablet (50 mg total) by mouth 2 (two) times daily.  . montelukast (SINGULAIR) 10 MG tablet TAKE 1 TABLET (10 MG TOTAL) BY MOUTH AT BEDTIME.  Marland Kitchen NEEDLE, DISP, 23 G (BD DISP NEEDLE) 23G X 1" MISC Uses to inject b12 into muscle once monthly  . omega-3 acid ethyl esters (LOVAZA) 1 G capsule TAKE 2 CAPSULES TWICE A DAY  . oxyCODONE-acetaminophen (PERCOCET) 10-325 MG per tablet Take 1 tablet by mouth every 6 (six) hours as needed.  . predniSONE (DELTASONE) 10 MG tablet 6 tablets twice daily for 3 days, then 6 tablets once daily and  begin to taper daily by 1 tablet until gone  . rosuvastatin (CRESTOR) 20 MG tablet TAKE 1 TABLET (20 MG TOTAL) BY MOUTH AT BEDTIME.  . sodium chloride (OCEAN) 0.65 % nasal spray Place 2 sprays into the nose as needed. Allergies    . SYNTHROID 150 MCG tablet TAKE 1 TABLET (150 MCG TOTAL) BY MOUTH DAILY.  . [DISCONTINUED] fluconazole (DIFLUCAN) 150 MG tablet Take 1 tablet (150 mg total) by mouth daily.  Marland Kitchen doxycycline (VIBRA-TABS) 100 MG tablet Take 1 tablet (100 mg total) by mouth 2 (two) times daily.  Marland Kitchen LASIX 20 MG tablet TAKE 1 TO 2 TABLETS DAILY AS NEEDED FOR FLUID RETENTION  . nebivolol (BYSTOLIC) 5 MG tablet Take 1 tablet (5 mg total) by mouth daily.  Marland Kitchen oxyCODONE (ROXICODONE) 5 MG  immediate release tablet Take 1-2 tablets (5-10 mg total) by mouth every 4 (four) hours as needed for pain.     No orders of the defined types were  placed in this encounter.    No Follow-up on file.

## 2013-09-09 ENCOUNTER — Telehealth: Payer: Self-pay | Admitting: *Deleted

## 2013-09-09 DIAGNOSIS — B370 Candidal stomatitis: Secondary | ICD-10-CM | POA: Insufficient documentation

## 2013-09-09 NOTE — Telephone Encounter (Signed)
Called [phone] for prior authorization on the ambien 5 mg, form is being faxed over

## 2013-09-09 NOTE — Assessment & Plan Note (Signed)
She is requesting a second dose of fluconazole. I've asked her to suspend her Crestor for she does this.

## 2013-09-09 NOTE — Assessment & Plan Note (Signed)
She was retreated for bronchitis versus pneumonia last week empirically when she presented for hospital followup of productive cough and fevers. No antigen was negative, chest x-ray was unchanged. Symptoms are improving. Symptoms were most likely viral given her continued low-grade fevers until yesterday. We will repeat her chest x-ray 6 weeks. Continues Symbicort to 2 puffs bid twice daily tobacco cessation and albuterol when necessary.

## 2013-09-09 NOTE — Addendum Note (Signed)
Addended by: Crecencio Mc on: 09/09/2013 12:59 PM   Modules accepted: Level of Service

## 2013-09-09 NOTE — Telephone Encounter (Signed)
Received PA request form, placed in Dr.Tullos box

## 2013-09-11 ENCOUNTER — Other Ambulatory Visit: Payer: Self-pay | Admitting: *Deleted

## 2013-09-11 NOTE — Addendum Note (Signed)
Addended by: Nanci Pina on: 09/11/2013 04:34 PM   Modules accepted: Orders

## 2013-09-12 NOTE — Telephone Encounter (Signed)
Okay to refill? Was just seen on 09/08/13

## 2013-09-15 ENCOUNTER — Telehealth: Payer: Self-pay | Admitting: Internal Medicine

## 2013-09-15 NOTE — Telephone Encounter (Signed)
Please advise 

## 2013-09-15 NOTE — Telephone Encounter (Signed)
Pt states she has been in hospital w/ pneumonia and is still having sinus symptoms.  States she has doxycycline at home already and is asking if she can take it.  States she has enough for 10 days.  States she has a lot of thick yellow/green phlegm.    Ambien, says her insurance/pharmacy needs something from [place].  She is not sure what they need.  Thinks her refills have run out and she needs more.  Needs nystatin.  Uses 4 times a day.  Has thrush from all the prednisone she  Has been on.

## 2013-09-15 NOTE — Telephone Encounter (Signed)
Sh received #90 on Dec 4th.  Per EPIC

## 2013-09-15 NOTE — Telephone Encounter (Signed)
SHE HAD 90 AMBIEN refilled in December.  Is not due.  Probably referring to prior authorization.  Please call pharmacy in morning  Yes she can take doxycycline with food Can refill the nystatin

## 2013-09-16 MED ORDER — NYSTATIN 100000 UNIT/ML MT SUSP: 5.0000 mL | Freq: Four times a day (QID) | OROMUCOSAL | Status: DC

## 2013-09-16 MED ORDER — AMBIEN 5 MG PO TABS: ORAL_TABLET | ORAL | Status: DC

## 2013-09-16 NOTE — Telephone Encounter (Signed)
rx called in [facility] 

## 2013-09-16 NOTE — Telephone Encounter (Signed)
Called pharmacy, pt only got #30 of Ambien filled in November.  She has gotten #90 in the past but now it requires a prior authorization.  Please advise on ambien refill.  Nystatin was sent in electronically to pharmacy

## 2013-09-16 NOTE — Telephone Encounter (Signed)
Can refill for 30.  The Prior Josem Kaufmann will take at least a week.  Please start the process

## 2013-09-23 ENCOUNTER — Telehealth: Payer: Self-pay | Admitting: Internal Medicine

## 2013-09-23 ENCOUNTER — Encounter: Payer: Self-pay | Admitting: Pulmonary Disease

## 2013-09-23 ENCOUNTER — Ambulatory Visit (INDEPENDENT_AMBULATORY_CARE_PROVIDER_SITE_OTHER): Payer: Federal, State, Local not specified - PPO | Admitting: Pulmonary Disease

## 2013-09-23 VITALS — BP 142/74 | HR 61 | Ht 62.5 in | Wt 199.0 lb

## 2013-09-23 DIAGNOSIS — R0602 Shortness of breath: Secondary | ICD-10-CM | POA: Insufficient documentation

## 2013-09-23 DIAGNOSIS — R059 Cough, unspecified: Secondary | ICD-10-CM

## 2013-09-23 DIAGNOSIS — R05 Cough: Secondary | ICD-10-CM

## 2013-09-23 DIAGNOSIS — J449 Chronic obstructive pulmonary disease, unspecified: Secondary | ICD-10-CM

## 2013-09-23 DIAGNOSIS — Z9989 Dependence on other enabling machines and devices: Secondary | ICD-10-CM

## 2013-09-23 DIAGNOSIS — G4733 Obstructive sleep apnea (adult) (pediatric): Secondary | ICD-10-CM

## 2013-09-23 DIAGNOSIS — Z8701 Personal history of pneumonia (recurrent): Secondary | ICD-10-CM

## 2013-09-23 NOTE — Assessment & Plan Note (Signed)
I strongly suspect COPD based on her lengthy smoking history. Fortunately, her oxygenation and exam is pretty much normal today. I also think there could be a good chance she has pulmonary hypertension considering her long-standing smoking history as well as obstructive sleep apnea.  Plan: -Continue Symbicort for now -Obtain overnight oximetry -Obtain records of recent echocardiogram -Full pulmonary function testing

## 2013-09-23 NOTE — Assessment & Plan Note (Signed)
Continue CPAP as ordered Obtain overnight oximetry

## 2013-09-23 NOTE — Telephone Encounter (Signed)
Pt stated [facility] called [facility]  09/22/13    She was told there was not rx there for her

## 2013-09-23 NOTE — Telephone Encounter (Signed)
Needing prior authorization   AMBIEN 5 MG tablet

## 2013-09-23 NOTE — Patient Instructions (Signed)
We will schedule full pulmonary function testing and pulmonary rehab at [facility] Use your inhaler with a spacer We will set up an overnight oximetry test We will see you back in 6 weeks or sooner if needed

## 2013-09-23 NOTE — Telephone Encounter (Signed)
TYI Pt came in today and wanted you to see her rx  Prednisone 10mg  tablet Take 6 tabs for 3 days then 6 tabs daily and begin to taper by 1 until finished Qty 57

## 2013-09-23 NOTE — Assessment & Plan Note (Signed)
She has made a remarkable recovery. Her chest x-ray has completely normalized.  At this point we need to focus her care on COPD.

## 2013-09-23 NOTE — Progress Notes (Signed)
Subjective:    Patient ID: [name], female    DOB: 30-May-1941, 73 y.o.   MRN: [id number]  HPI  Kaleeya is here to see me for recurrent pneumonia, most recently she was on a ventilator for ten days at [facility] from [place].  She had onset of symptoms and was on the vent within 72 hours.  Apparently it was difficult to get her off the vent.  She was discharged from rehab on 08/26/2013.  She feels like she is doing well but she still hasn't gotten all her strength back and she is walking and performing ADL's.  She hasn't been carrying anything heavy and she has taken it easy with stairs.  She turned down PT at home because she didn't feel strong enough.  Prior to this she smoked 1ppd for 50 years and quit in 07/2013.  She did not have respiratory problems as a child or an adult.  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  She says that she knows that she has COPD and has had lung function testing in the past. She was started on symbicort she thinks about 5 years ago. She currently take this regularly.  She does not see cardiology for her CHF.  She had an echocardiogram recently.    She uses CPAP qHS with O2 bleed in.  She isn't sure about how much oxygen she needs.    Past Medical History  Diagnosis Date  . Degenerative disc disease     HERNITAED DISC BY MRI ON 07/07;S/P LAMINECTOMY,RID REPLACEMENT  . Brain aneurysm     S/P RESECTION  . Discoid lupus erythematosus eyelid     NEGATIVE SEROLOGIC MARKERS  . Hypercholesterolemia   . Chronic sinusitis   . Hypertension   . Dysrhythmia     PVC's occ.  . Pneumonia 05/30/2010    after receiving Propofol  . Headache(784.0)     migraines occ.  . CHF (congestive heart failure) 05/2010    after receiving Propofol  . Hypothyroidism     takes Synthroid  . Blood transfusion 2009    back surgery  . Asthma 06/2010    depending on what around  . Complication of  anesthesia 05/30/2010    from colonoscopy-had Propofol-had a  . Complication of anesthesia     reaction-went into asthma-pneumonia,-  . Complication of anesthesia     then CHF and to ICU  . Sleep apnea     uses CPAP-8.4-oxygen suppliment     Family History  Problem Relation Age of Onset  . Heart disease Mother   . Stroke Mother   . Alcohol abuse Other   . Cancer Sister     leukemia  . Cancer Brother     lung Ca, asbestos     History   Social History  . Marital Status: Married    Spouse Name: N/A    Number of Children: N/A  . Years of Education: N/A   Occupational History  . Not on file.   Social History Main Topics  . Smoking status: Former Smoker -- 0.50 packs/day for 40 years    Types: Cigarettes    Quit date: 07/24/2013  . Smokeless tobacco: Never Used     Comment: REMOTELY QUIT  TOBACCO USE  . Alcohol Use: Yes     Comment: occasional  . Drug Use: No  . Sexual Activity: Not on file   Other  Topics Concern  . Not on file   Social History Narrative   Light exercise   LIVES WITH SPOUSE           Allergies  Allergen Reactions  . Penicillins Anaphylaxis    Septra ANTI-INFECTIVE AGENTS-MISC..WELTS,ITCHING  . Propranolol     chf  . Triamcinolone Acetonide Hives  . Adhesive [Tape] Rash  . Septra [Bactrim] Swelling and Rash     Outpatient Prescriptions Prior to Visit  Medication Sig Dispense Refill  . albuterol (PROVENTIL) (2.5 MG/3ML) 0.083% nebulizer solution Take 3 mLs (2.5 mg total) by nebulization every 6 (six) hours as needed for wheezing or shortness of breath.  75 mL  12  . AMBIEN 5 MG tablet TAKE 1 TABLET AT BEDTIME  30 tablet  1  . amLODipine (NORVASC) 10 MG tablet TAKE 1 TABLET (10 MG TOTAL) BY MOUTH DAILY.  90 tablet  1  . benzonatate (TESSALON) 100 MG capsule TAKE 1 CAPSULE (100 MG TOTAL) BY MOUTH EVERY 4 (FOUR) HOURS.  480 capsule  1  . budesonide-formoterol (SYMBICORT) 160-4.5 MCG/ACT inhaler Inhale 2 puffs into the lungs 2 (two) times  daily.      Marland Kitchen BYSTOLIC 5 MG tablet TAKE 1 TABLET BY MOUTH EVERY DAY  30 tablet  5  . CLARINEX 5 MG tablet TAKE 1 TABLET (5 MG TOTAL) BY MOUTH EVERY MORNING.  90 tablet  3  . cyanocobalamin (,VITAMIN B-12,) 1000 MCG/ML injection Inject 1 mL (1,000 mcg total) into the muscle every 30 (thirty) days.  10 mL  3  . diazepam (VALIUM) 5 MG tablet TAKE 1 TABLET TWICE DAILY  180 tablet  1  . doxepin (SINEQUAN) 25 MG capsule Take 2 capsules (50 mg total) by mouth at bedtime.  30 capsule  5  . escitalopram (LEXAPRO) 10 MG tablet TAKE 1 TABLET (10 MG TOTAL) BY MOUTH EVERY MORNING.  90 tablet  1  . FeFum-FePo-FA-B Cmp-C-Zn-Mn-Cu (SE-TAN PLUS) 162-115.2-1 MG CAPS TAKE 1 TABLET BY MOUTH DAILY.  90 capsule  1  . Fluticasone-Salmeterol (ADVAIR) 100-50 MCG/DOSE AEPB Inhale 1 puff into the lungs 2 (two) times daily.      . isosorbide mononitrate (IMDUR) 60 MG 24 hr tablet TAKE ONE BY MOUTH DAILY  90 tablet  1  . LASIX 20 MG tablet TAKE 1 TO 2 TABLETS DAILY AS NEEDED FOR FLUID RETENTION  180 tablet  0  . lidocaine (LIDODERM) 5 % PLACE 3 PATCHES ONTO THE SKIN DAILY. REMOVE & DISCARD PATCH WITHIN 12 HOURS OR AS DIRECTED BY MD  90 patch  3  . losartan (COZAAR) 50 MG tablet Take 1 tablet (50 mg total) by mouth 2 (two) times daily.  60 tablet  6  . montelukast (SINGULAIR) 10 MG tablet TAKE 1 TABLET (10 MG TOTAL) BY MOUTH AT BEDTIME.  90 tablet  1  . NEEDLE, DISP, 23 G (BD DISP NEEDLE) 23G X 1" MISC Uses to inject b12 into muscle once monthly  12 each  1  . nystatin (MYCOSTATIN) 100000 UNIT/ML suspension Take 5 mLs (500,000 Units total) by mouth 4 (four) times daily. Swish and swallow  60 mL  0  . omega-3 acid ethyl esters (LOVAZA) 1 G capsule TAKE 2 CAPSULES TWICE A DAY  360 capsule  3  . oxyCODONE-acetaminophen (PERCOCET) 10-325 MG per tablet Take 1 tablet by mouth every 6 (six) hours as needed.  120 tablet  0  . rosuvastatin (CRESTOR) 20 MG tablet TAKE 1 TABLET (20 MG TOTAL) BY MOUTH AT  BEDTIME.  90 tablet  1  . sodium  chloride (OCEAN) 0.65 % nasal spray Place 2 sprays into the nose as needed. Allergies        . SYNTHROID 150 MCG tablet TAKE 1 TABLET (150 MCG TOTAL) BY MOUTH DAILY.  90 tablet  3  . fluconazole (DIFLUCAN) 150 MG tablet Take 1 tablet (150 mg total) by mouth daily.  2 tablet  0  . albuterol (PROVENTIL) (2.5 MG/3ML) 0.083% nebulizer solution Take 3 mLs (2.5 mg total) by nebulization every 6 (six) hours as needed for wheezing or shortness of breath.  150 mL  1  . doxycycline (VIBRA-TABS) 100 MG tablet Take 1 tablet (100 mg total) by mouth 2 (two) times daily.  20 tablet  0  . FeFum-FePo-FA-B Cmp-C-Zn-Mn-Cu (TANDEM PLUS) 162-115.2-1 MG CAPS TAKE 1 TABLET BY MOUTH DAILY.  30 each  1  . levofloxacin (LEVAQUIN) 750 MG tablet Take 1 tablet (750 mg total) by mouth daily.  7 tablet  0  . nebivolol (BYSTOLIC) 5 MG tablet Take 1 tablet (5 mg total) by mouth daily.  30 tablet  3  . oxyCODONE (ROXICODONE) 5 MG immediate release tablet Take 1-2 tablets (5-10 mg total) by mouth every 4 (four) hours as needed for pain.  30 tablet  0  . predniSONE (DELTASONE) 10 MG tablet 6 tablets twice daily for 3 days, then 6 tablets once daily and  begin to taper daily by 1 tablet until gone  57 tablet  0   Facility-Administered Medications Prior to Visit  Medication Dose Route Frequency Provider Last Rate Last Dose  . chlorhexidine (HIBICLENS) 4 % liquid 4 application  60 mL Topical Once Gearlean Alf, MD          Review of Systems  Constitutional: Negative for fever and unexpected weight change.  HENT: Negative for congestion, dental problem, ear pain, nosebleeds, postnasal drip, rhinorrhea, sinus pressure, sneezing, sore throat and trouble swallowing.   Eyes: Negative for redness and itching.  Respiratory: Positive for cough and wheezing. Negative for chest tightness and shortness of breath.   Cardiovascular: Negative for palpitations and leg swelling.  Gastrointestinal: Negative for nausea and vomiting.   Genitourinary: Negative for dysuria.  Musculoskeletal: Negative for joint swelling.  Skin: Negative for rash.  Neurological: Negative for headaches.  Hematological: Does not bruise/bleed easily.  Psychiatric/Behavioral: Negative for dysphoric mood. The patient is not nervous/anxious.        Objective:   Physical Exam  Filed Vitals:   09/23/13 1008  BP: 142/74  Pulse: 61  Height: 5' 2.5" (1.588 m)  Weight: 199 lb (90.266 kg)  SpO2: 97%   RA  Gen: well appearing, no acute distress HEENT: NCAT, PERRL, EOMi, OP clear, neck supple without masses, thick neck PULM: CTA B CV: RRR, no mgr, no JVD AB: BS+, soft, nontender, no hsm Ext: warm, ankle edema noted, no clubbing, no cyanosis Derm: no rash or skin breakdown Neuro: A&Ox4, CN II-XII intact, strength 5/5 in all 4 extremities      Assessment & Plan:   OSA on CPAP Continue CPAP as ordered Obtain overnight oximetry  History of bacterial pneumonia She has made a remarkable recovery. Her chest x-ray has completely normalized.  At this point we need to focus her care on COPD.  COPD, moderate I strongly suspect COPD based on her lengthy smoking history. Fortunately, her oxygenation and exam is pretty much normal today. I also think there could be a good chance she has pulmonary hypertension  considering her long-standing smoking history as well as obstructive sleep apnea.  Plan: -Continue Symbicort for now -Obtain overnight oximetry -Obtain records of recent echocardiogram -Full pulmonary function testing    Updated Medication List Outpatient Encounter Prescriptions as of 09/23/2013  Medication Sig  . albuterol (PROVENTIL) (2.5 MG/3ML) 0.083% nebulizer solution Take 3 mLs (2.5 mg total) by nebulization every 6 (six) hours as needed for wheezing or shortness of breath.  . AMBIEN 5 MG tablet TAKE 1 TABLET AT BEDTIME  . amLODipine (NORVASC) 10 MG tablet TAKE 1 TABLET (10 MG TOTAL) BY MOUTH DAILY.  . benzonatate (TESSALON)  100 MG capsule TAKE 1 CAPSULE (100 MG TOTAL) BY MOUTH EVERY 4 (FOUR) HOURS.  . budesonide-formoterol (SYMBICORT) 160-4.5 MCG/ACT inhaler Inhale 2 puffs into the lungs 2 (two) times daily.  Marland Kitchen BYSTOLIC 5 MG tablet TAKE 1 TABLET BY MOUTH EVERY DAY  . CLARINEX 5 MG tablet TAKE 1 TABLET (5 MG TOTAL) BY MOUTH EVERY MORNING.  . cyanocobalamin (,VITAMIN B-12,) 1000 MCG/ML injection Inject 1 mL (1,000 mcg total) into the muscle every 30 (thirty) days.  . diazepam (VALIUM) 5 MG tablet TAKE 1 TABLET TWICE DAILY  . doxepin (SINEQUAN) 25 MG capsule Take 2 capsules (50 mg total) by mouth at bedtime.  Marland Kitchen escitalopram (LEXAPRO) 10 MG tablet TAKE 1 TABLET (10 MG TOTAL) BY MOUTH EVERY MORNING.  . FeFum-FePo-FA-B Cmp-C-Zn-Mn-Cu (SE-TAN PLUS) 162-115.2-1 MG CAPS TAKE 1 TABLET BY MOUTH DAILY.  Marland Kitchen Fluticasone-Salmeterol (ADVAIR) 100-50 MCG/DOSE AEPB Inhale 1 puff into the lungs 2 (two) times daily.  . isosorbide mononitrate (IMDUR) 60 MG 24 hr tablet TAKE ONE BY MOUTH DAILY  . LASIX 20 MG tablet TAKE 1 TO 2 TABLETS DAILY AS NEEDED FOR FLUID RETENTION  . lidocaine (LIDODERM) 5 % PLACE 3 PATCHES ONTO THE SKIN DAILY. REMOVE & DISCARD PATCH WITHIN 12 HOURS OR AS DIRECTED BY MD  . losartan (COZAAR) 50 MG tablet Take 1 tablet (50 mg total) by mouth 2 (two) times daily.  . montelukast (SINGULAIR) 10 MG tablet TAKE 1 TABLET (10 MG TOTAL) BY MOUTH AT BEDTIME.  Marland Kitchen NEEDLE, DISP, 23 G (BD DISP NEEDLE) 23G X 1" MISC Uses to inject b12 into muscle once monthly  . nystatin (MYCOSTATIN) 100000 UNIT/ML suspension Take 5 mLs (500,000 Units total) by mouth 4 (four) times daily. Swish and swallow  . omega-3 acid ethyl esters (LOVAZA) 1 G capsule TAKE 2 CAPSULES TWICE A DAY  . oxyCODONE-acetaminophen (PERCOCET) 10-325 MG per tablet Take 1 tablet by mouth every 6 (six) hours as needed.  . rosuvastatin (CRESTOR) 20 MG tablet TAKE 1 TABLET (20 MG TOTAL) BY MOUTH AT BEDTIME.  . sodium chloride (OCEAN) 0.65 % nasal spray Place 2 sprays into the  nose as needed. Allergies    . SYNTHROID 150 MCG tablet TAKE 1 TABLET (150 MCG TOTAL) BY MOUTH DAILY.  . fluconazole (DIFLUCAN) 150 MG tablet Take 1 tablet (150 mg total) by mouth daily.  . [DISCONTINUED] albuterol (PROVENTIL) (2.5 MG/3ML) 0.083% nebulizer solution Take 3 mLs (2.5 mg total) by nebulization every 6 (six) hours as needed for wheezing or shortness of breath.  . [DISCONTINUED] doxycycline (VIBRA-TABS) 100 MG tablet Take 1 tablet (100 mg total) by mouth 2 (two) times daily.  . [DISCONTINUED] FeFum-FePo-FA-B Cmp-C-Zn-Mn-Cu (TANDEM PLUS) 162-115.2-1 MG CAPS TAKE 1 TABLET BY MOUTH DAILY.  . [DISCONTINUED] levofloxacin (LEVAQUIN) 750 MG tablet Take 1 tablet (750 mg total) by mouth daily.  . [DISCONTINUED] nebivolol (BYSTOLIC) 5 MG tablet Take 1 tablet (5  mg total) by mouth daily.  . [DISCONTINUED] oxyCODONE (ROXICODONE) 5 MG immediate release tablet Take 1-2 tablets (5-10 mg total) by mouth every 4 (four) hours as needed for pain.  . [DISCONTINUED] predniSONE (DELTASONE) 10 MG tablet 6 tablets twice daily for 3 days, then 6 tablets once daily and  begin to taper daily by 1 tablet until gone

## 2013-09-24 ENCOUNTER — Telehealth: Payer: Self-pay | Admitting: Internal Medicine

## 2013-09-24 ENCOUNTER — Other Ambulatory Visit: Payer: Self-pay | Admitting: *Deleted

## 2013-09-24 ENCOUNTER — Telehealth: Payer: Self-pay | Admitting: Pulmonary Disease

## 2013-09-24 MED ORDER — AEROCHAMBER MV MISC: Status: DC

## 2013-09-24 MED ORDER — NYSTATIN 100000 UNIT/ML MT SUSP: 5.0000 mL | Freq: Four times a day (QID) | OROMUCOSAL | Status: DC

## 2013-09-24 NOTE — Addendum Note (Signed)
Addended by: Len Blalock on: 09/24/2013 03:23 PM   Modules accepted: Orders

## 2013-09-24 NOTE — Telephone Encounter (Signed)
Relevant patient education assigned to patient using Emmi. ° °

## 2013-09-24 NOTE — Telephone Encounter (Signed)
We took care of it and told her husband

## 2013-09-24 NOTE — Telephone Encounter (Signed)
There should be spacers in [place] to give the pt. I Have LM for ashley to call to see if she has any there. I don's think the pt can get this from pharmacy. [place] Bing, CMA

## 2013-09-24 NOTE — Telephone Encounter (Signed)
Spoke with [name] and she has a spacer so I advised her to put one aside for the pt. I spoke with the pt and advised. She will go today to get spacer.  Bing, CMA

## 2013-09-24 NOTE — Telephone Encounter (Signed)
This has been taken care of per BQ.

## 2013-09-24 NOTE — Telephone Encounter (Signed)
Spoke with the pt and she states Dr. Lake Bells advised that she has thrush and that he was going to send in rx for thrush. I do not see this mentioned in the note. Please advise Dr. Lake Bells. [facility]. [place] Bing, CMA Allergies  Allergen Reactions  . Penicillins Anaphylaxis    Septra ANTI-INFECTIVE AGENTS-MISC..WELTS,ITCHING  . Propranolol     chf  . Triamcinolone Acetonide Hives  . Adhesive [Tape] Rash  . Septra [Bactrim] Swelling and Rash

## 2013-09-25 NOTE — Telephone Encounter (Signed)
Please advise 

## 2013-09-25 NOTE — Telephone Encounter (Signed)
Prior authorization in process

## 2013-09-29 ENCOUNTER — Encounter: Payer: Self-pay | Admitting: Internal Medicine

## 2013-09-30 ENCOUNTER — Telehealth: Payer: Self-pay | Admitting: Internal Medicine

## 2013-09-30 DIAGNOSIS — M1711 Unilateral primary osteoarthritis, right knee: Secondary | ICD-10-CM

## 2013-09-30 MED ORDER — OXYCODONE-ACETAMINOPHEN 10-325 MG PO TABS: 1.0000 | ORAL_TABLET | Freq: Four times a day (QID) | ORAL | Status: DC | PRN

## 2013-09-30 NOTE — Telephone Encounter (Signed)
Pt needs Ambien refilled.  She would also like to go ahead and get her percocet script as well so that she can pick them up at the same time.  States she will hold it until the refill date.   BP 137/60 usually, now 188/77 since she has been taking a generic BP med.  Pt states she needs the brand name BP medication that she was taking previously.  States she talked to pharmacist about this.

## 2013-09-30 NOTE — Telephone Encounter (Signed)
Patient Bp meds are under brand name called pharmacy and verified patient requesting refill on ambien and percocet. Ambien has refill remaining and was just filled 09/15/13 percocet filled 09/10/13 please advise.

## 2013-09-30 NOTE — Telephone Encounter (Signed)
please inform ms Tipps that there are no early refills on [place] ; and [place] was sent in on jan 26th.  She can pick up the oxycodone rx because it was refilled jan 12 .  Prior authorization for name brand cozaar will take time,  Is she taking 100 mg daily? If so we will need to add clonidine 0.1 mg bid until the PA can be obtained.

## 2013-10-01 ENCOUNTER — Telehealth: Payer: Self-pay | Admitting: Internal Medicine

## 2013-10-01 NOTE — Telephone Encounter (Signed)
Can you get me a prior authorization for this ambien?

## 2013-10-01 NOTE — Telephone Encounter (Signed)
Patient notified script placed up front.

## 2013-10-01 NOTE — Telephone Encounter (Signed)
Pt states she has talked to pharmacy again regarding Ambien.  Pt states the pharmacy did receive script faxed on 1/26 but cannot accept it.  States we have to get authorization from the patient's insurance company and then we would have to print out the prescription and the patient would have to hand deliver the prescription to the pharmacy just like she does her pain medication.  Call Ronalee Belts or Teola Bradley (sounds like [place])  at [place] if needed.  States they can explain to [place] what they need if we do not understand.

## 2013-10-02 ENCOUNTER — Other Ambulatory Visit: Payer: Self-pay | Admitting: Internal Medicine

## 2013-10-02 NOTE — Telephone Encounter (Signed)
Refill

## 2013-10-04 ENCOUNTER — Other Ambulatory Visit: Payer: Self-pay | Admitting: Internal Medicine

## 2013-10-08 ENCOUNTER — Encounter: Payer: Self-pay | Admitting: Pulmonary Disease

## 2013-10-09 ENCOUNTER — Telehealth: Payer: Self-pay

## 2013-10-09 ENCOUNTER — Encounter: Payer: Self-pay | Admitting: Pulmonary Disease

## 2013-10-09 DIAGNOSIS — G4734 Idiopathic sleep related nonobstructive alveolar hypoventilation: Secondary | ICD-10-CM | POA: Insufficient documentation

## 2013-10-09 DIAGNOSIS — J329 Chronic sinusitis, unspecified: Secondary | ICD-10-CM | POA: Insufficient documentation

## 2013-10-09 DIAGNOSIS — G4733 Obstructive sleep apnea (adult) (pediatric): Secondary | ICD-10-CM

## 2013-10-09 NOTE — Telephone Encounter (Signed)
Hey Dr. Lake Bells, just to clarify: I had two notes on this patient.  From what I can see, the ONO was scheduled on room air.  Do I need to get her set up for 2LPM qhs, or re-order the ONO with 3LPM?  I just want to make sure I'm ordering the correct thing for her.    Thanks,  Caryl Pina

## 2013-10-09 NOTE — Telephone Encounter (Signed)
Message copied by Len Blalock on Thu Oct 09, 2013  9:05 AM ------      Message from: Simonne Maffucci B      Created: Wed Oct 08, 2013 10:27 PM       A,            Can you call her to tell her that her ONO showed that she dropped her O2 saturation to a very low level for 37 minutes even on oxygen.  She needs to turn her O2 up to 3L and we need to repeat the ONO.            Thanks      B ------

## 2013-10-10 ENCOUNTER — Encounter: Payer: Self-pay | Admitting: Internal Medicine

## 2013-10-10 ENCOUNTER — Ambulatory Visit (INDEPENDENT_AMBULATORY_CARE_PROVIDER_SITE_OTHER): Payer: Federal, State, Local not specified - PPO | Admitting: Internal Medicine

## 2013-10-10 VITALS — BP 132/62 | HR 62 | Temp 98.1°F | Resp 16

## 2013-10-10 DIAGNOSIS — M549 Dorsalgia, unspecified: Secondary | ICD-10-CM

## 2013-10-10 DIAGNOSIS — D509 Iron deficiency anemia, unspecified: Secondary | ICD-10-CM

## 2013-10-10 DIAGNOSIS — J449 Chronic obstructive pulmonary disease, unspecified: Secondary | ICD-10-CM

## 2013-10-10 DIAGNOSIS — I10 Essential (primary) hypertension: Secondary | ICD-10-CM

## 2013-10-10 DIAGNOSIS — G8929 Other chronic pain: Secondary | ICD-10-CM

## 2013-10-10 DIAGNOSIS — D594 Other nonautoimmune hemolytic anemias: Secondary | ICD-10-CM

## 2013-10-10 LAB — CBC WITH DIFFERENTIAL/PLATELET
BASOS ABS: 0 10*3/uL (ref 0.0–0.1)
Basophils Relative: 0.5 % (ref 0.0–3.0)
EOS ABS: 0.1 10*3/uL (ref 0.0–0.7)
Eosinophils Relative: 1.3 % (ref 0.0–5.0)
HEMATOCRIT: 35.6 % — AB (ref 36.0–46.0)
HEMOGLOBIN: 11.7 g/dL — AB (ref 12.0–15.0)
LYMPHS ABS: 2.6 10*3/uL (ref 0.7–4.0)
Lymphocytes Relative: 27 % (ref 12.0–46.0)
MCHC: 32.8 g/dL (ref 30.0–36.0)
MCV: 95.3 fl (ref 78.0–100.0)
Monocytes Absolute: 0.7 10*3/uL (ref 0.1–1.0)
Monocytes Relative: 7.5 % (ref 3.0–12.0)
NEUTROS ABS: 6 10*3/uL (ref 1.4–7.7)
Neutrophils Relative %: 63.7 % (ref 43.0–77.0)
Platelets: 225 10*3/uL (ref 150.0–400.0)
RBC: 3.74 Mil/uL — ABNORMAL LOW (ref 3.87–5.11)
RDW: 14 % (ref 11.5–14.6)
WBC: 9.5 10*3/uL (ref 4.5–10.5)

## 2013-10-10 LAB — IRON AND TIBC
%SAT: 31 % (ref 20–55)
Iron: 96 ug/dL (ref 42–145)
TIBC: 306 ug/dL (ref 250–470)
UIBC: 210 ug/dL (ref 125–400)

## 2013-10-10 LAB — FERRITIN: FERRITIN: 185.4 ng/mL (ref 10.0–291.0)

## 2013-10-10 NOTE — Telephone Encounter (Signed)
Order placed, pt aware.  Nothing further needed.  

## 2013-10-10 NOTE — Progress Notes (Signed)
Patient ID: ADELISA BREACH, female   DOB: [birth year], 73 y.o.   MRN: [id number]  Patient Active Problem List   Diagnosis Date Noted  . Nocturnal hypoxaemia 10/09/2013  . COPD, moderate 09/23/2013  . Thrush, oral 09/09/2013  . History of bacterial pneumonia 09/01/2013  . 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 History of tobacco abuse 09/01/2013  . Other and unspecified hyperlipidemia 03/28/2013  . Preoperative clearance 03/28/2013  . Routine general medical examination at a health care facility 03/27/2013  . OSA on CPAP 03/27/2013  . Cough 03/13/2013  . Insomnia due to anxiety and fear 03/13/2013  . Unspecified constipation 03/13/2013  . Allergic rhinitis 03/13/2013  . Chronic nausea 12/02/2012  . Obesity (BMI 30-39.9) 03/10/2012  . Monoclonal gammopathy of undetermined significance 03/10/2012  . Anemia 01/01/2012  . Unspecified hypothyroidism 01/01/2012  . Osteoarthritis of right knee 07/12/2011  . Brain aneurysm 04/18/2011  . Chronic back pain greater than 3 months duration 04/18/2011  . Breast screening, unspecified 04/18/2011  . Hypertension 04/07/2011    Subjective:  CC:   Chief Complaint  Patient presents with  . Follow-up    1 month    HPI:   RAYNNA HOLVECK is a 73 y.o. female who presents for  1 month follow up on COPD exacerbation with recent hospitalization for VDRF secondary to Legionella pneumonia. Her cough has finally mproved.    Her thrush has finally resolved since brushing tongue with baking soda and soft brush and water   Having difficulty getting ambien refilled . Needs prior authorization.  Which has not been filled despite a refill authorized on January 27th .   Had a fall in the bathroom on saturday when her underwear got caught under her toilet seat  And caused her to lose its balance.  Bumped her head on the top and has a large bruise on her left abdomen.    Did not participate with home PT due to persistent weakness, but has been spending 5  minutes a day on her recumbent bike without chest tightness or dyspnea.  She prefers to continue PT at home on her own.     Past Medical History  Diagnosis Date  . Degenerative disc disease     HERNITAED DISC BY MRI ON 07/07;S/P LAMINECTOMY,RID REPLACEMENT  . Brain aneurysm     S/P RESECTION  . Discoid lupus erythematosus eyelid     NEGATIVE SEROLOGIC MARKERS  . Hypercholesterolemia   . Chronic sinusitis   . Hypertension   . Dysrhythmia     PVC's occ.  . Pneumonia 05/30/2010    after receiving Propofol  . Headache(784.0)     migraines occ.  . CHF (congestive heart failure) 05/2010    after receiving Propofol  . Hypothyroidism     takes Synthroid  . Blood transfusion 2009    back surgery  . Asthma 06/2010    depending on what around  . Complication of anesthesia 05/30/2010    from colonoscopy-had Propofol-had a  . Complication of anesthesia     reaction-went into asthma-pneumonia,-  . Complication of anesthesia     then CHF and to ICU  . Sleep apnea     uses CPAP-8.4-oxygen suppliment    Past Surgical History  Procedure Laterality Date  . Cosmetic surgery    . Tonsillectomy  1963  . Back surgery  1968,1988,2002,2009    4 laminectomies and fusions  . Abdominal hysterectomy  1988    uterus only  . Brain surgery  2005    coiling of cranial aneurysum  . Excision morton's neuroma  1990    left foot  . Nasal sinus surgery  1977-2008  . Lipomas  1975    3 from right breast  . Tympanic membrane repair  2009-last one    x5 times  . Total knee arthroplasty  07/10/2011    Procedure: TOTAL KNEE ARTHROPLASTY;  Surgeon: Gearlean Alf;  Location: [facility];  Service: Orthopedics;  Laterality: Right;  . Joint replacement  2013    Right knee,  [place]  . Eye surgery      both cataracts -[place] 7/14  . Foot arthrodesis Right 05/22/2013    Procedure: RIGHT HALLUX METATARSAL PHALANGEAL JOINT ARTHRODESIS ;  Surgeon: Wylene Simmer, MD;  Location: [place];  Service:  Orthopedics;  Laterality: Right;       The following portions of the patient's history were reviewed and updated as appropriate: Allergies, current medications, and problem list.    Review of Systems:   Patient denies headache, fevers, malaise, unintentional weight loss, skin rash, eye pain, sinus congestion and sinus pain, sore throat, dysphagia,  hemoptysis , cough, dyspnea, wheezing, chest pain, palpitations, orthopnea, edema, abdominal pain, nausea, melena, diarrhea, constipation, flank pain, dysuria, hematuria, urinary  Frequency, nocturia, numbness, tingling, seizures,  Focal weakness, Loss of consciousness,  Tremor, insomnia, depression, anxiety, and suicidal ideation.     History   Social History  . Marital Status: Married    Spouse Name: N/A    Number of Children: N/A  . Years of Education: N/A   Occupational History  . Not on file.   Social History Main Topics  . Smoking status: Former Smoker -- 0.50 packs/day for 40 years    Types: Cigarettes    Quit date: 07/24/2013  . Smokeless tobacco: Never Used     Comment: REMOTELY QUIT  TOBACCO USE  . Alcohol Use: Yes     Comment: occasional  . Drug Use: No  . Sexual Activity: Not Currently   Other Topics Concern  . Not on file   Social History Narrative   Light exercise   LIVES WITH SPOUSE          Objective:  Filed Vitals:   10/10/13 1408  BP: 132/62  Pulse: 62  Temp: 98.1 F (36.7 C)  Resp: 16     General appearance: alert, cooperative and appears stated age Ears: normal TM's and external ear canals both ears Throat: lips, mucosa, and tongue normal; teeth and gums normal Neck: no adenopathy, no carotid bruit, supple, symmetrical, trachea midline and thyroid not enlarged, symmetric, no tenderness/mass/nodules Back: symmetric, no curvature. ROM normal. No CVA tenderness. Lungs: clear to auscultation bilaterally Heart: regular rate and rhythm, S1, S2 normal, no murmur, click, rub or gallop Abdomen:  soft, non-tender; bowel sounds normal; no masses,  no organomegaly Pulses: 2+ and symmetric Skin: Skin color, texture, turgor normal. No rashes or lesions Lymph nodes: Cervical, supraclavicular, and axillary nodes normal.  Assessment and Plan:  Anemia Stable.  Iron studies are normal/    Lab Results  Component Value Date   WBC 9.5 10/10/2013   HGB 11.7* 10/10/2013   HCT 35.6* 10/10/2013   MCV 95.3 10/10/2013   PLT 225.0 10/10/2013   Lab Results  Component Value Date   FERRITIN 185.4 10/10/2013   Lab Results  Component Value Date   IRON 96 10/10/2013   TIBC 306 10/10/2013   FERRITIN 185.4 10/10/2013     Hypertension Well  controlled on current regimen. Renal function stable, no changes today.  Lab Results  Component Value Date   CREATININE 1.1 09/01/2013     Chronic back pain greater than 3 months duration Symptoms are stable on current narcotic regimen. No changes today  COPD, moderate she has seen dr Lake Bells and had pulmonary function testing done.  Her symptoms are stable  On current inhaled therapy.    Updated Medication List Outpatient Encounter Prescriptions as of 10/10/2013  Medication Sig  . albuterol (PROVENTIL) (2.5 MG/3ML) 0.083% nebulizer solution Take 3 mLs (2.5 mg total) by nebulization every 6 (six) hours as needed for wheezing or shortness of breath.  . AMBIEN 5 MG tablet TAKE 1 TABLET AT BEDTIME  . amLODipine (NORVASC) 10 MG tablet TAKE 1 TABLET (10 MG TOTAL) BY MOUTH DAILY.  . benzonatate (TESSALON) 100 MG capsule TAKE 1 CAPSULE (100 MG TOTAL) BY MOUTH EVERY 4 (FOUR) HOURS.  . budesonide-formoterol (SYMBICORT) 160-4.5 MCG/ACT inhaler Inhale 2 puffs into the lungs 2 (two) times daily.  Marland Kitchen BYSTOLIC 5 MG tablet TAKE 1 TABLET BY MOUTH EVERY DAY  . CLARINEX 5 MG tablet TAKE 1 TABLET (5 MG TOTAL) BY MOUTH EVERY MORNING.  . cyanocobalamin (,VITAMIN B-12,) 1000 MCG/ML injection Inject 1 mL (1,000 mcg total) into the muscle every 30 (thirty) days.  . diazepam  (VALIUM) 5 MG tablet TAKE 1 TABLET TWICE DAILY  . DIFLUCAN 150 MG tablet TAKE 1 TABLET (150 MG TOTAL) BY MOUTH DAILY.  Marland Kitchen doxepin (SINEQUAN) 25 MG capsule Take 2 capsules (50 mg total) by mouth at bedtime.  Marland Kitchen escitalopram (LEXAPRO) 10 MG tablet TAKE 1 TABLET (10 MG TOTAL) BY MOUTH EVERY MORNING.  . isosorbide mononitrate (IMDUR) 60 MG 24 hr tablet TAKE ONE BY MOUTH DAILY  . LASIX 20 MG tablet TAKE 1 TO 2 TABLETS DAILY AS NEEDED FOR FLUID RETENTION  . lidocaine (LIDODERM) 5 % PLACE 3 PATCHES ONTO THE SKIN DAILY. REMOVE & DISCARD PATCH WITHIN 12 HOURS OR AS DIRECTED BY MD  . losartan (COZAAR) 50 MG tablet Take 1 tablet (50 mg total) by mouth 2 (two) times daily.  . montelukast (SINGULAIR) 10 MG tablet TAKE 1 TABLET (10 MG TOTAL) BY MOUTH AT BEDTIME.  Marland Kitchen NEEDLE, DISP, 23 G (BD DISP NEEDLE) 23G X 1" MISC Uses to inject b12 into muscle once monthly  . omega-3 acid ethyl esters (LOVAZA) 1 G capsule TAKE 2 CAPSULES TWICE A DAY  . oxyCODONE-acetaminophen (PERCOCET) 10-325 MG per tablet Take 1 tablet by mouth every 6 (six) hours as needed. May refill on or after Oct 01 2013  . rosuvastatin (CRESTOR) 20 MG tablet TAKE 1 TABLET (20 MG TOTAL) BY MOUTH AT BEDTIME.  . sodium chloride (OCEAN) 0.65 % nasal spray Place 2 sprays into the nose as needed. Allergies    . Spacer/Aero-Holding Chambers (AEROCHAMBER MV) inhaler Use as instructed  . SYNTHROID 150 MCG tablet TAKE 1 TABLET (150 MCG TOTAL) BY MOUTH DAILY.  . [DISCONTINUED] FeFum-FePo-FA-B Cmp-C-Zn-Mn-Cu (SE-TAN PLUS) 162-115.2-1 MG CAPS TAKE 1 TABLET BY MOUTH DAILY.  Marland Kitchen Fluticasone-Salmeterol (ADVAIR) 100-50 MCG/DOSE AEPB Inhale 1 puff into the lungs 2 (two) times daily.  Marland Kitchen nystatin (MYCOSTATIN) 100000 UNIT/ML suspension Take 5 mLs (500,000 Units total) by mouth 4 (four) times daily. Swish and swallow  . [DISCONTINUED] SYNTHROID 150 MCG tablet TAKE 1 TABLET (150 MCG TOTAL) BY MOUTH DAILY.     Orders Placed This Encounter  Procedures  . Ferritin  . Iron  and TIBC  . CBC with  Differential    No Follow-up on file.

## 2013-10-10 NOTE — Assessment & Plan Note (Addendum)
Stable.  Iron studies are normal/    Lab Results  Component Value Date   WBC 9.5 10/10/2013   HGB 11.7* 10/10/2013   HCT 35.6* 10/10/2013   MCV 95.3 10/10/2013   PLT 225.0 10/10/2013   Lab Results  Component Value Date   FERRITIN 185.4 10/10/2013   Lab Results  Component Value Date   IRON 96 10/10/2013   TIBC 306 10/10/2013   FERRITIN 185.4 10/10/2013

## 2013-10-10 NOTE — Patient Instructions (Signed)
  Start with 15 minutes daily of recumbent bike and increase gradually until you are short of breath  If your iron stores are low we will resume iron supplements  Return in 3 months

## 2013-10-10 NOTE — Progress Notes (Signed)
Pre-visit discussion using our clinic review tool. No additional management support is needed unless otherwise documented below in the visit note.  

## 2013-10-10 NOTE — Telephone Encounter (Signed)
Oh, for some reason I thought she was on RA Then all she needs is 2L qHS

## 2013-10-12 ENCOUNTER — Encounter: Payer: Self-pay | Admitting: Internal Medicine

## 2013-10-12 ENCOUNTER — Other Ambulatory Visit: Payer: Self-pay | Admitting: Internal Medicine

## 2013-10-12 NOTE — Assessment & Plan Note (Signed)
she has seen dr Lake Bells and had pulmonary function testing done.  Her symptoms are stable  On current inhaled therapy.

## 2013-10-12 NOTE — Assessment & Plan Note (Signed)
Symptoms are stable on current narcotic regimen. No changes today

## 2013-10-12 NOTE — Assessment & Plan Note (Signed)
Well controlled on current regimen. Renal function stable, no changes today.  Lab Results  Component Value Date   CREATININE 1.1 09/01/2013

## 2013-10-13 ENCOUNTER — Telehealth: Payer: Self-pay | Admitting: Internal Medicine

## 2013-10-13 NOTE — Telephone Encounter (Signed)
Relevant patient education assigned to patient using Emmi. ° °

## 2013-10-14 NOTE — Telephone Encounter (Signed)
Mailed unread MyChart message to pt  

## 2013-10-15 ENCOUNTER — Other Ambulatory Visit: Payer: Self-pay

## 2013-10-15 MED ORDER — NYSTATIN 100000 UNIT/ML MT SUSP
5.0000 mL | Freq: Four times a day (QID) | OROMUCOSAL | Status: DC
Start: 2013-10-15 — End: 2013-11-13

## 2013-10-21 ENCOUNTER — Telehealth: Payer: Self-pay | Admitting: *Deleted

## 2013-10-21 NOTE — Telephone Encounter (Signed)
Patient received approval from insurance today on Ambien notified patient . [employer] for Prior Authorization approval as I had advised patient I would. FYI

## 2013-10-23 ENCOUNTER — Ambulatory Visit: Payer: Self-pay | Admitting: Pulmonary Disease

## 2013-10-23 ENCOUNTER — Telehealth: Payer: Self-pay | Admitting: Internal Medicine

## 2013-10-23 DIAGNOSIS — M1711 Unilateral primary osteoarthritis, right knee: Secondary | ICD-10-CM

## 2013-10-23 MED ORDER — OXYCODONE-ACETAMINOPHEN 10-325 MG PO TABS: 1.0000 | ORAL_TABLET | Freq: Four times a day (QID) | ORAL | Status: DC | PRN

## 2013-10-23 NOTE — Telephone Encounter (Addendum)
oxyCODONE-acetaminophen (PERCOCET) 10-325 MG per tablet Ok to refill for [facility]  11

## 2013-10-23 NOTE — Telephone Encounter (Signed)
Last Fill 10/01/13 okay to fill dated fill 10/29/13 on or after?

## 2013-10-24 NOTE — Telephone Encounter (Signed)
Left voicemail per dpr script ready for pick up and placed up front.

## 2013-10-27 ENCOUNTER — Encounter: Payer: Self-pay | Admitting: Pulmonary Disease

## 2013-10-28 ENCOUNTER — Telehealth: Payer: Self-pay

## 2013-10-28 DIAGNOSIS — R0602 Shortness of breath: Secondary | ICD-10-CM

## 2013-10-28 NOTE — Telephone Encounter (Signed)
Message copied by Len Blalock on Tue Oct 28, 2013 12:34 PM ------      Message from: Simonne Maffucci B      Created: Mon Oct 27, 2013  8:59 AM       A,      Her PFT's did not show COPD, but they showed that her oxygen level was abnormal which could be due to pulmonary fibrosis or pulmonary hypertension.            So we need to get a copy of her recent echo from [facility] (not in my look at and not scanned or resulted in Epic anywhere) and we need to order a CT scan of her chest high rest for possible interstitial lung disease; Dr. Weber Cooks or [facility] to read.            Thanks      B ------

## 2013-10-28 NOTE — Telephone Encounter (Signed)
Echo has been requested.  Order has been placed.  LMTCB X1 to make pt aware.

## 2013-10-31 ENCOUNTER — Encounter: Payer: Self-pay | Admitting: Pulmonary Disease

## 2013-11-04 ENCOUNTER — Ambulatory Visit: Payer: Self-pay | Admitting: Pulmonary Disease

## 2013-11-04 ENCOUNTER — Encounter: Payer: Self-pay | Admitting: Pulmonary Disease

## 2013-11-05 ENCOUNTER — Encounter: Payer: Self-pay | Admitting: Pulmonary Disease

## 2013-11-05 ENCOUNTER — Other Ambulatory Visit: Payer: Self-pay | Admitting: Internal Medicine

## 2013-11-05 ENCOUNTER — Telehealth: Payer: Self-pay | Admitting: *Deleted

## 2013-11-05 ENCOUNTER — Ambulatory Visit (INDEPENDENT_AMBULATORY_CARE_PROVIDER_SITE_OTHER): Payer: Federal, State, Local not specified - PPO | Admitting: Pulmonary Disease

## 2013-11-05 VITALS — BP 128/70 | HR 65 | Ht 62.5 in | Wt 205.0 lb

## 2013-11-05 DIAGNOSIS — R5381 Other malaise: Secondary | ICD-10-CM

## 2013-11-05 DIAGNOSIS — R5383 Other fatigue: Secondary | ICD-10-CM

## 2013-11-05 DIAGNOSIS — R531 Weakness: Secondary | ICD-10-CM

## 2013-11-05 DIAGNOSIS — R0602 Shortness of breath: Secondary | ICD-10-CM

## 2013-11-05 DIAGNOSIS — G4733 Obstructive sleep apnea (adult) (pediatric): Secondary | ICD-10-CM

## 2013-11-05 DIAGNOSIS — Z9989 Dependence on other enabling machines and devices: Secondary | ICD-10-CM

## 2013-11-05 NOTE — Assessment & Plan Note (Signed)
[name] did not have COPD on her lung function testing despite years of smoking  However, she had a depressed DLCO with totally normal lung volumes.  A CT chest did not show pulmonary fibrosis.  A recent echo cardiogram from [facility] showed RVSP of 55-60 mmHg with RV enlargement and mild reduction in RV function.  So based on this data I am concerned that she has pulmonary hypertension.  She has what sounds like mild sleep apnea and she is compliant with her CPAP.  She has a history of SLE, but says that she has been told that she has an atypical case and has never required treatment.    Plan: -refer to Cardiology in [facility] for a right heart catheterization -serology panel for PH associated connective tissue disease -repeat CPAP titration study -f/u 4-6 weeks

## 2013-11-05 NOTE — Patient Instructions (Addendum)
We will refer you to Dr. Haroldine Laws for a right heart catheterization We will also set up a CPAP titration study in [place] We will see you back in 4-6 weeks or sooner if needed

## 2013-11-05 NOTE — Telephone Encounter (Signed)
Iron stopped with last labs, per Dr. Germaine Pomfret message 10/12/13

## 2013-11-05 NOTE — Telephone Encounter (Signed)
Refill request  SE-TAN plus Capsule   #30   Take 1 tablet by mouth daily

## 2013-11-05 NOTE — Progress Notes (Signed)
Subjective:    Patient ID: [name], female    DOB: 1941/07/17, 73 y.o.   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  As of 10/2013 her PFTs, CT chest, and echo suggested pulmonary hypertension.  She had a history of SLE (diagnosed as a young adult but never treated).  HPI  11/05/2013 ROV>  [name] has been doing well and had her CT chest yesterday.  She has increased her oxygen to 4L [place] at night. She ontinues to use and benefit from her CPAP machine every night.   She has been doing well and yesterday she climbed a flight of stairs yesterday without difficulty.  She has not been as active with exercise as she would like.  No dyspnea with regularly activity.     She nodes a rash occasionally when she gets out in the sun. She thinks that she may have lupus as she was diagnosed with this many years ago.    Past Medical History  Diagnosis Date  . Degenerative disc disease     HERNITAED DISC BY MRI ON 07/07;S/P LAMINECTOMY,RID REPLACEMENT  . Brain aneurysm     S/P RESECTION  . Discoid lupus erythematosus eyelid     NEGATIVE SEROLOGIC MARKERS  . Hypercholesterolemia   . Chronic sinusitis   . Hypertension   . Dysrhythmia     PVC's occ.  . Pneumonia 05/30/2010    after receiving Propofol  . Headache(784.0)     migraines occ.  . CHF (congestive heart failure) 05/2010    after receiving Propofol  . Hypothyroidism     takes Synthroid  . Blood transfusion 2009    back surgery  . Asthma 06/2010    depending on what around  . Complication of anesthesia 05/30/2010    from colonoscopy-had Propofol-had a  . Complication of anesthesia     reaction-went into asthma-pneumonia,-  . Complication of anesthesia     then CHF and to ICU  . Sleep apnea     uses CPAP-8.4-oxygen suppliment     Review of Systems  Constitutional: Positive for fatigue.  Negative for fever and chills.  HENT: Negative for postnasal drip, rhinorrhea and sinus pressure.   Respiratory: Positive for shortness of breath. Negative for cough and wheezing.   Cardiovascular: Positive for leg swelling. Negative for chest pain and palpitations.       Objective:   Physical Exam Filed Vitals:   11/05/13 1504  BP: 128/70  Pulse: 65  Height: 5' 2.5" (1.588 m)  Weight: 205 lb (92.987 kg)  SpO2: 97%  RA  Gen: well appearing, no acute distress HEENT: NCAT,  EOMi, OP clear, PULM: few crackles in bases CV: RRR, no mgr, no JVD AB: BS+, soft, nontender, no hsm Ext: warm, trace edema, no clubbing, no cyanosis   10/2013 PFT> Ratio 85% FEV1 1.85L (101% pred, -2% change with BD), TLC 3.89L (88% pred), ERV 0.20 L (23% pred), DLCO 8.9 (43% pred) 10/2013 CT chest [place] read> no ILD; mild post infectious or inflammatory scarring in lingula; 3 vessel coronary disease 2015 [facility]TE [facility] > LVEF normal, RVSP 55-36mmHg, RV dilation and mildly decreased function      Assessment & Plan:   Shortness of breath [name] did not have COPD on her lung function testing despite years of smoking  However, she had a depressed DLCO with totally  normal lung volumes.  A CT chest did not show pulmonary fibrosis.  A recent echo cardiogram from [facility] showed RVSP of 55-60 mmHg with RV enlargement and mild reduction in RV function.  So based on this data I am concerned that she has pulmonary hypertension.  She has what sounds like mild sleep apnea and she is compliant with her CPAP.  She has a history of SLE, but says that she has been told that she has an atypical case and has never required treatment.    Plan: -refer to Cardiology in [facility] for a right heart catheterization -serology panel for PH associated connective tissue disease -repeat CPAP titration study -f/u 4-6 weeks    Updated Medication List Outpatient Encounter Prescriptions as of 11/05/2013  Medication Sig  . albuterol  (PROVENTIL) (2.5 MG/3ML) 0.083% nebulizer solution Take 3 mLs (2.5 mg total) by nebulization every 6 (six) hours as needed for wheezing or shortness of breath.  . AMBIEN 5 MG tablet TAKE 1 TABLET AT BEDTIME  . amLODipine (NORVASC) 10 MG tablet TAKE 1 TABLET (10 MG TOTAL) BY MOUTH DAILY.  . benzonatate (TESSALON) 100 MG capsule TAKE 1 CAPSULE (100 MG TOTAL) BY MOUTH EVERY 4 (FOUR) HOURS.  . budesonide-formoterol (SYMBICORT) 160-4.5 MCG/ACT inhaler Inhale 2 puffs into the lungs 2 (two) times daily.  Marland Kitchen BYSTOLIC 5 MG tablet TAKE 1 TABLET BY MOUTH EVERY DAY  . CLARINEX 5 MG tablet TAKE 1 TABLET (5 MG TOTAL) BY MOUTH EVERY MORNING.  . cyanocobalamin (,VITAMIN B-12,) 1000 MCG/ML injection Inject 1 mL (1,000 mcg total) into the muscle every 30 (thirty) days.  . diazepam (VALIUM) 5 MG tablet TAKE 1 TABLET TWICE DAILY  . doxepin (SINEQUAN) 25 MG capsule Take 2 capsules (50 mg total) by mouth at bedtime.  Marland Kitchen escitalopram (LEXAPRO) 10 MG tablet TAKE 1 TABLET (10 MG TOTAL) BY MOUTH EVERY MORNING.  . isosorbide mononitrate (IMDUR) 60 MG 24 hr tablet TAKE ONE BY MOUTH DAILY  . LASIX 20 MG tablet TAKE 1 TO 2 TABLETS DAILY AS NEEDED FOR FLUID RETENTION  . lidocaine (LIDODERM) 5 % PLACE 3 PATCHES ONTO THE SKIN DAILY. REMOVE & DISCARD PATCH WITHIN 12 HOURS OR AS DIRECTED BY MD  . losartan (COZAAR) 50 MG tablet Take 1 tablet (50 mg total) by mouth 2 (two) times daily.  Marland Kitchen NEEDLE, DISP, 23 G (BD DISP NEEDLE) 23G X 1" MISC Uses to inject b12 into muscle once monthly  . nystatin (MYCOSTATIN) 100000 UNIT/ML suspension Take 5 mLs (500,000 Units total) by mouth 4 (four) times daily. Swish and swallow  . omega-3 acid ethyl esters (LOVAZA) 1 G capsule TAKE 2 CAPSULES TWICE A DAY  . oxyCODONE-acetaminophen (PERCOCET) 10-325 MG per tablet Take 1 tablet by mouth every 6 (six) hours as needed. May refill on or after  October 29 2013  . rosuvastatin (CRESTOR) 20 MG tablet TAKE 1 TABLET (20 MG TOTAL) BY MOUTH AT BEDTIME.  . sodium  chloride (OCEAN) 0.65 % nasal spray Place 2 sprays into the nose as needed. Allergies    . Spacer/Aero-Holding Chambers (AEROCHAMBER MV) inhaler Use as instructed  . spironolactone (ALDACTONE) 25 MG tablet TAKE 1 TABLET EVERY DAY  . SYNTHROID 150 MCG tablet TAKE 1 TABLET (150 MCG TOTAL) BY MOUTH DAILY.  . SYNTHROID 150 MCG tablet TAKE 1 TABLET (150 MCG TOTAL) BY MOUTH DAILY.  . montelukast (SINGULAIR) 10 MG tablet TAKE 1 TABLET (10 MG TOTAL) BY MOUTH AT BEDTIME.  . [DISCONTINUED] BYSTOLIC 5 MG tablet TAKE  1 TABLET BY MOUTH EVERY DAY  . [DISCONTINUED] DIFLUCAN 150 MG tablet TAKE 1 TABLET (150 MG TOTAL) BY MOUTH DAILY.  . [DISCONTINUED] Fluticasone-Salmeterol (ADVAIR) 100-50 MCG/DOSE AEPB Inhale 1 puff into the lungs 2 (two) times daily.

## 2013-11-06 ENCOUNTER — Encounter: Payer: Self-pay | Admitting: Pulmonary Disease

## 2013-11-06 ENCOUNTER — Emergency Department: Payer: Self-pay | Admitting: Internal Medicine

## 2013-11-06 LAB — ANTI-SCLERODERMA ANTIBODY: Scleroderma (Scl-70) (ENA) Antibody, IgG: <1

## 2013-11-06 LAB — ANTI-NUCLEAR AB-TITER (ANA TITER): ANA Titer 1: 1:80 {titer} — ABNORMAL HIGH

## 2013-11-06 LAB — ANTI-DNA ANTIBODY, DOUBLE-STRANDED

## 2013-11-06 LAB — ANA: Anti Nuclear Antibody(ANA): POSITIVE — AB

## 2013-11-06 LAB — TSH: TSH: 0.36 u[IU]/mL (ref 0.35–5.50)

## 2013-11-06 LAB — HIV ANTIBODY (ROUTINE TESTING W REFLEX): HIV: NONREACTIVE

## 2013-11-06 LAB — RNP ANTIBODY: RIBONUCLEIC PROTEIN(ENA) ANTIBODY, IGG: NEGATIVE

## 2013-11-07 ENCOUNTER — Telehealth: Payer: Self-pay

## 2013-11-07 ENCOUNTER — Other Ambulatory Visit: Payer: Self-pay | Admitting: *Deleted

## 2013-11-07 NOTE — Telephone Encounter (Signed)
Pt aware of results, and states that she and her daughter both have lupus.  Just an fyi.  Nothing further needed.

## 2013-11-10 LAB — BETA-2 GLYCOPROTEIN I AB,G/M: BETA 2 GLYCO 1 IGM: 125 GPI IgM units — AB (ref 0–32)

## 2013-11-13 ENCOUNTER — Encounter: Payer: Self-pay | Admitting: Pulmonary Disease

## 2013-11-13 ENCOUNTER — Encounter (HOSPITAL_COMMUNITY): Payer: Self-pay | Admitting: Pharmacy Technician

## 2013-11-14 ENCOUNTER — Encounter (HOSPITAL_COMMUNITY): Admission: RE | Payer: Self-pay | Source: Ambulatory Visit

## 2013-11-14 ENCOUNTER — Ambulatory Visit (HOSPITAL_COMMUNITY)
Admission: RE | Admit: 2013-11-14 | Payer: Federal, State, Local not specified - PPO | Source: Ambulatory Visit | Admitting: Internal Medicine

## 2013-11-14 SURGERY — RIGHT HEART CATH
Anesthesia: LOCAL

## 2013-11-18 ENCOUNTER — Other Ambulatory Visit (HOSPITAL_COMMUNITY): Payer: Self-pay | Admitting: Adult Health

## 2013-11-18 ENCOUNTER — Telehealth: Payer: Self-pay | Admitting: *Deleted

## 2013-11-18 ENCOUNTER — Encounter (HOSPITAL_COMMUNITY): Payer: Self-pay | Admitting: *Deleted

## 2013-11-18 ENCOUNTER — Telehealth (HOSPITAL_COMMUNITY): Payer: Self-pay | Admitting: *Deleted

## 2013-11-18 DIAGNOSIS — R0600 Dyspnea, unspecified: Secondary | ICD-10-CM

## 2013-11-18 NOTE — Telephone Encounter (Signed)
Per Dr Lake Bells [facility] sch for 4/10 with Dr Haroldine Laws, pt aware and instructions reviewed with her via phone, copy mailed

## 2013-11-18 NOTE — Telephone Encounter (Signed)
Patient called and stated that she thought you had told her to stop calcium, patient has recently tripped over parking median and has broken her fifth metacarpal on right foot should sh e start back on calcium?

## 2013-11-19 NOTE — Telephone Encounter (Signed)
No,  Breaking a toe does not warrant starting calcium back

## 2013-11-19 NOTE — Telephone Encounter (Signed)
Patient notified

## 2013-11-25 ENCOUNTER — Ambulatory Visit: Payer: Self-pay | Admitting: Pulmonary Disease

## 2013-11-28 ENCOUNTER — Ambulatory Visit (HOSPITAL_COMMUNITY)
Admission: RE | Admit: 2013-11-28 | Discharge: 2013-11-28 | Disposition: A | Discharge status: Home / Self Care | Payer: Federal, State, Local not specified - PPO | Source: Ambulatory Visit | Attending: Internal Medicine | Admitting: Internal Medicine

## 2013-11-28 ENCOUNTER — Encounter (HOSPITAL_COMMUNITY)
Admission: RE | Disposition: A | Discharge status: Home / Self Care | Payer: Self-pay | Source: Ambulatory Visit | Attending: Internal Medicine

## 2013-11-28 DIAGNOSIS — I129 Hypertensive chronic kidney disease with stage 1 through stage 4 chronic kidney disease, or unspecified chronic kidney disease: Secondary | ICD-10-CM | POA: Insufficient documentation

## 2013-11-28 DIAGNOSIS — J329 Chronic sinusitis, unspecified: Secondary | ICD-10-CM | POA: Insufficient documentation

## 2013-11-28 DIAGNOSIS — R0600 Dyspnea, unspecified: Secondary | ICD-10-CM

## 2013-11-28 DIAGNOSIS — R0989 Other specified symptoms and signs involving the circulatory and respiratory systems: Principal | ICD-10-CM | POA: Insufficient documentation

## 2013-11-28 DIAGNOSIS — G4733 Obstructive sleep apnea (adult) (pediatric): Secondary | ICD-10-CM | POA: Insufficient documentation

## 2013-11-28 DIAGNOSIS — G43909 Migraine, unspecified, not intractable, without status migrainosus: Secondary | ICD-10-CM | POA: Insufficient documentation

## 2013-11-28 DIAGNOSIS — I279 Pulmonary heart disease, unspecified: Secondary | ICD-10-CM

## 2013-11-28 DIAGNOSIS — N189 Chronic kidney disease, unspecified: Secondary | ICD-10-CM | POA: Insufficient documentation

## 2013-11-28 DIAGNOSIS — E039 Hypothyroidism, unspecified: Secondary | ICD-10-CM | POA: Insufficient documentation

## 2013-11-28 DIAGNOSIS — I509 Heart failure, unspecified: Secondary | ICD-10-CM | POA: Insufficient documentation

## 2013-11-28 DIAGNOSIS — E7800 Pure hypercholesterolemia, unspecified: Secondary | ICD-10-CM | POA: Insufficient documentation

## 2013-11-28 DIAGNOSIS — R0609 Other forms of dyspnea: Secondary | ICD-10-CM | POA: Insufficient documentation

## 2013-11-28 DIAGNOSIS — J45909 Unspecified asthma, uncomplicated: Secondary | ICD-10-CM | POA: Insufficient documentation

## 2013-11-28 DIAGNOSIS — Z87891 Personal history of nicotine dependence: Secondary | ICD-10-CM | POA: Insufficient documentation

## 2013-11-28 HISTORY — PX: RIGHT HEART CATHETERIZATION: SHX5447

## 2013-11-28 LAB — CBC
HCT: 34.6 % — ABNORMAL LOW (ref 36.0–46.0)
Hemoglobin: 11.5 g/dL — ABNORMAL LOW (ref 12.0–15.0)
MCH: 31.6 pg (ref 26.0–34.0)
MCHC: 33.2 g/dL (ref 30.0–36.0)
MCV: 95.1 fL (ref 78.0–100.0)
Platelets: 148 10*3/uL — ABNORMAL LOW (ref 150–400)
RBC: 3.64 MIL/uL — ABNORMAL LOW (ref 3.87–5.11)
RDW: 13 % (ref 11.5–15.5)
WBC: 5.7 10*3/uL (ref 4.0–10.5)

## 2013-11-28 LAB — POCT I-STAT 3, VENOUS BLOOD GAS (G3P V)
Acid-base deficit: 5 mmol/L — ABNORMAL HIGH (ref 0.0–2.0)
Acid-base deficit: 6 mmol/L — ABNORMAL HIGH (ref 0.0–2.0)
Bicarbonate: 19.7 mEq/L — ABNORMAL LOW (ref 20.0–24.0)
Bicarbonate: 19.8 mEq/L — ABNORMAL LOW (ref 20.0–24.0)
O2 Saturation: 69 %
O2 Saturation: 70 %
PH VEN: 7.34 — AB (ref 7.250–7.300)
PH VEN: 7.341 — AB (ref 7.250–7.300)
PO2 VEN: 38 mmHg (ref 30.0–45.0)
TCO2: 21 mmol/L (ref 0–100)
TCO2: 21 mmol/L (ref 0–100)
pCO2, Ven: 36.4 mmHg — ABNORMAL LOW (ref 45.0–50.0)
pCO2, Ven: 36.8 mmHg — ABNORMAL LOW (ref 45.0–50.0)
pO2, Ven: 38 mmHg (ref 30.0–45.0)

## 2013-11-28 LAB — PROTIME-INR
INR: 1.03 (ref 0.00–1.49)
Prothrombin Time: 13.3 seconds (ref 11.6–15.2)

## 2013-11-28 LAB — BASIC METABOLIC PANEL
BUN: 36 mg/dL — AB (ref 6–23)
CALCIUM: 10.2 mg/dL (ref 8.4–10.5)
CO2: 22 mEq/L (ref 19–32)
CREATININE: 1.65 mg/dL — AB (ref 0.50–1.10)
Chloride: 107 mEq/L (ref 96–112)
GFR, EST AFRICAN AMERICAN: 35 mL/min — AB (ref >90)
GFR, EST NON AFRICAN AMERICAN: 30 mL/min — AB (ref >90)
Glucose, Bld: 94 mg/dL (ref 70–99)
Potassium: 4.5 mEq/L (ref 3.7–5.3)
Sodium: 141 mEq/L (ref 137–147)

## 2013-11-28 SURGERY — RIGHT HEART CATH
Anesthesia: LOCAL

## 2013-11-28 MED ORDER — HEPARIN (PORCINE) IN NACL 2-0.9 UNIT/ML-% IJ SOLN
INTRAMUSCULAR | Status: AC
  Filled 2013-11-28: qty 1000

## 2013-11-28 MED ORDER — SODIUM CHLORIDE 0.9 % IJ SOLN: 3.0000 mL | INTRAMUSCULAR | Status: DC | PRN

## 2013-11-28 MED ORDER — SODIUM CHLORIDE 0.9 % IJ SOLN: 3.0000 mL | Freq: Two times a day (BID) | INTRAMUSCULAR | Status: DC

## 2013-11-28 MED ORDER — SODIUM CHLORIDE 0.9 % IV SOLN: INTRAVENOUS | Status: DC

## 2013-11-28 MED ORDER — MIDAZOLAM HCL 2 MG/2ML IJ SOLN
INTRAMUSCULAR | Status: AC
  Filled 2013-11-28: qty 2

## 2013-11-28 MED ORDER — LIDOCAINE HCL (PF) 1 % IJ SOLN
INTRAMUSCULAR | Status: AC
  Filled 2013-11-28: qty 30

## 2013-11-28 MED ORDER — ASPIRIN 81 MG PO CHEW
81.0000 mg | CHEWABLE_TABLET | ORAL | Status: AC
  Administered 2013-11-28: 81 mg via ORAL
  Filled 2013-11-28: qty 1

## 2013-11-28 MED ORDER — SODIUM CHLORIDE 0.9 % IV SOLN: 250.0000 mL | INTRAVENOUS | Status: DC | PRN

## 2013-11-28 MED ORDER — FENTANYL CITRATE 0.05 MG/ML IJ SOLN
INTRAMUSCULAR | Status: AC
  Filled 2013-11-28: qty 2

## 2013-11-28 NOTE — Interval H&P Note (Signed)
History and Physical Interval Note:  11/28/2013 1:08 PM  [name]  has presented today for surgery, with the diagnosis of hf  The various methods of treatment have been discussed with the patient and family. After consideration of risks, benefits and other options for treatment, the patient has consented to  Procedure(s): RIGHT HEART CATH (N/A) as a surgical intervention .  The patient's history has been reviewed, patient examined, no change in status, stable for surgery.  I have reviewed the patient's chart and labs.  Questions were answered to the patient's satisfaction.     Shaune Pascal Cassandre Oleksy

## 2013-11-28 NOTE — H&P (Signed)
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 She is referred by Dr. Lake Bells for [place].   She had prolonged hospitalization recently for PNA and respiratory failure.  Subsequently saw Dr. Lake Bells for ongoing dyspnea and work-up suggested PAH with RHF. Gets dyspneic with mild activity. Requires 4L O2 by [place]. On PFTs spirometry relatively normal but DLCO markedly reduced.    10/2013 PFT> Ratio 85% FEV1 1.85L (101% pred, -2% change with BD), TLC 3.89L (88% pred), ERV 0.20 L (23% pred), DLCO 8.9 (43% pred)   10/2013 CT chest [place] read> no ILD; mild post infectious or inflammatory scarring in lingula; 3 vessel coronary disease   2015 TTE [facility] > LVEF normal, RVSP 55-50mmHg, RV dilation and mildly decreased function      Review of Systems:     Cardiac Review of Systems: {Y] = yes [ ]  = no  Chest Pain [    ]  Resting SOB [   ] Exertional SOB  [ Y ]  Orthopnea [Y  ]   Pedal Edema [   ]    Palpitations [  ] Syncope  [  ]   Presyncope [   ]  General Review of Systems: [Y] = yes [  ]=no Constitional: recent weight change [  ]; anorexia [  ]; fatigue [  Y]; nausea [  ]; night sweats [  ]; fever [  ]; or chills [  ];                                                                                                                                          Dental: poor dentition[  ];   Eye : blurred vision [  ]; diplopia [   ]; vision changes [  ];  Amaurosis fugax[  ]; Resp: cough [ y ];  wheezing[  ];  hemoptysis[  ]; shortness of breath[ y ]; paroxysmal nocturnal dyspnea[  ]; dyspnea on exertion[ y ]; or orthopnea[  ];  GI:  gallstones[  ], vomiting[  ];  dysphagia[  ]; melena[  ];  hematochezia [  ]; heartburn[  ];    GU: kidney stones [  ]; hematuria[  ];   dysuria [  ];  nocturia[  ];  history of     obstruction [  ];                 Skin: rash, swelling[  ];, hair loss[  ];  peripheral edema[  ];  or itching[   ]; Musculosketetal: myalgias[  ];  joint swelling[  ];  joint erythema[  ];  joint pain[ y ];  back pain[  ];  Heme/Lymph: bruising[  ];  bleeding[  ];  anemia[  ];  Neuro: TIA[  ];  headaches[  ];  stroke[  ];  vertigo[  ];  seizures[  ];   paresthesias[  ];  difficulty walking[  ];  Psych:depression[  ]; anxiety[  ];  Endocrine: diabetes[  ];  thyroid dysfunction[  ];  Immunizations: Flu [  ]; Pneumococcal[  ];  Other:  Past Medical History  Diagnosis Date  . Degenerative disc disease     HERNITAED DISC BY MRI ON 07/07;S/P LAMINECTOMY,RID REPLACEMENT  . Brain aneurysm     S/P RESECTION  . Discoid lupus erythematosus eyelid     NEGATIVE SEROLOGIC MARKERS  . Hypercholesterolemia   . Chronic sinusitis   . Hypertension   . Dysrhythmia     PVC's occ.  . Pneumonia 05/30/2010    after receiving Propofol  . Headache(784.0)     migraines occ.  . CHF (congestive heart failure) 05/2010    after receiving Propofol  . Hypothyroidism     takes Synthroid  . Blood transfusion 2009    back surgery  . Asthma 06/2010    depending on what around  . Complication of anesthesia 05/30/2010    from colonoscopy-had Propofol-had a  . Complication of anesthesia     reaction-went into asthma-pneumonia,-  . Complication of anesthesia     then CHF and to ICU  . Sleep apnea     uses CPAP-8.4-oxygen suppliment    Medications Prior to Admission  Medication Sig Dispense Refill  . amLODipine (NORVASC) 10 MG tablet Take 10 mg by mouth daily.      . Ascorbic Acid (VITAMIN C) 1000 MG tablet Take 1,000 mg by mouth daily.      . benzonatate (TESSALON) 100 MG capsule Take 100 mg by mouth 2 (two) times daily.      Marland Kitchen desloratadine (CLARINEX) 5 MG tablet Take 5 mg by mouth daily.      . diazepam (VALIUM) 5 MG tablet Take 5 mg by mouth every 12 (twelve) hours as needed for muscle spasms. TAKE 1 TABLET TWICE DAILY      . doxepin (SINEQUAN) 25 MG capsule Take 2 capsules (50 mg total) by mouth at bedtime.  30  capsule  5  . escitalopram (LEXAPRO) 10 MG tablet Take 10 mg by mouth daily. TAKE 1 TABLET (10 MG TOTAL) BY MOUTH EVERY MORNING.      . isosorbide mononitrate (IMDUR) 60 MG 24 hr tablet Take 60 mg by mouth daily. TAKE ONE BY MOUTH DAILY      . levothyroxine (SYNTHROID) 150 MCG tablet Take 150 mcg by mouth daily before breakfast. TAKE 1 TABLET (150 MCG TOTAL) BY MOUTH DAILY.      Marland Kitchen lidocaine (LIDODERM) 5 % Place 3 patches onto the skin daily. PLACE 3 PATCHES ONTO THE SKIN DAILY. REMOVE & DISCARD PATCH WITHIN 12 HOURS OR AS DIRECTED BY MD      . losartan (COZAAR) 50 MG tablet Take 1 tablet (50 mg total) by mouth 2 (two) times daily.  60 tablet  6  . montelukast (SINGULAIR) 10 MG tablet Take 10 mg by mouth at bedtime. TAKE 1 TABLET (10 MG TOTAL) BY MOUTH AT BEDTIME.      . nebivolol (BYSTOLIC) 5 MG tablet Take 5 mg by mouth daily.      Marland Kitchen NEEDLE, DISP, 23 G (BD DISP NEEDLE) 23G X 1" MISC Uses to inject b12 into muscle once monthly  12 each  1  . omega-3 acid ethyl esters (LOVAZA) 1 G capsule Take 2 g by mouth 2 (two)  times daily. TAKE 2 CAPSULES TWICE A DAY      . OVER THE COUNTER MEDICATION Take 1 capsule by mouth daily. Florify      . oxyCODONE-acetaminophen (PERCOCET) 10-325 MG per tablet Take 1 tablet by mouth every 6 (six) hours as needed for pain. May refill on or after  October 29 2013      . rosuvastatin (CRESTOR) 20 MG tablet Take 20 mg by mouth daily. TAKE 1 TABLET (20 MG TOTAL) BY MOUTH AT BEDTIME.      . sodium chloride (OCEAN) 0.65 % nasal spray Place 2 sprays into the nose as needed. Allergies        . spironolactone (ALDACTONE) 25 MG tablet Take 25 mg by mouth daily.      Marland Kitchen zolpidem (AMBIEN) 5 MG tablet Take 5 mg by mouth at bedtime as needed for sleep. TAKE 1 TABLET AT BEDTIME      . cyanocobalamin (,VITAMIN B-12,) 1000 MCG/ML injection Inject 1 mL (1,000 mcg total) into the muscle every 30 (thirty) days.  10 mL  3  . furosemide (LASIX) 20 MG tablet Take 20-40 mg by mouth daily as needed  for fluid.         Allergies  Allergen Reactions  . Penicillins Anaphylaxis    Septra ANTI-INFECTIVE AGENTS-MISC..WELTS,ITCHING  . Propranolol     chf  . Triamcinolone Acetonide Hives  . Adhesive [Tape] Rash  . Septra [Bactrim] Swelling and Rash    History   Social History  . Marital Status: Married    Spouse Name: N/A    Number of Children: N/A  . Years of Education: N/A   Occupational History  . Not on file.   Social History Main Topics  . Smoking status: Former Smoker -- 0.50 packs/day for 40 years    Types: Cigarettes    Quit date: 07/24/2013  . Smokeless tobacco: Never Used     Comment: REMOTELY QUIT  TOBACCO USE  . Alcohol Use: Yes     Comment: occasional  . Drug Use: No  . Sexual Activity: Not Currently   Other Topics Concern  . Not on file   Social History Narrative   Light exercise   LIVES WITH SPOUSE          Family History  Problem Relation Age of Onset  . Heart disease Mother   . Stroke Mother   . Alcohol abuse Other   . Cancer Sister     leukemia  . Cancer Brother     lung Ca, asbestos    PHYSICAL EXAM: Filed Vitals:   11/28/13 0923  BP: 120/44  Pulse: 50  Temp: 98.1 F (36.7 C)  Resp: 20   Gen: well appearing, no acute distress wearing O2 HEENT: NCAT, EOMi, OP clear,  PULM: few crackles in bases  CV: RRR, 2/6 TR.  P2 increased. JVP 7 AB: BS+, soft, nontender, no hsm  Ext: warm, trace edema, no clubbing, no cyanosis    Results for orders placed during the hospital encounter of 11/28/13 (from the past 24 hour(s))  BASIC METABOLIC PANEL     Status: Abnormal   Collection Time    11/28/13  9:30 AM      Result Value Ref Range   Sodium 141  137 - 147 mEq/L   Potassium 4.5  3.7 - 5.3 mEq/L   Chloride 107  96 - 112 mEq/L   CO2 22  19 - 32 mEq/L   Glucose, Bld 94  70 -  99 mg/dL   BUN 36 (*) 6 - 23 mg/dL   Creatinine, Ser 1.65 (*) 0.50 - 1.10 mg/dL   Calcium 10.2  8.4 - 10.5 mg/dL   GFR calc non Af Amer 30 (*) >90 mL/min    GFR calc Af Amer 35 (*) >90 mL/min  CBC     Status: Abnormal   Collection Time    11/28/13  9:30 AM      Result Value Ref Range   WBC 5.7  4.0 - 10.5 K/uL   RBC 3.64 (*) 3.87 - 5.11 MIL/uL   Hemoglobin 11.5 (*) 12.0 - 15.0 g/dL   HCT 34.6 (*) 36.0 - 46.0 %   MCV 95.1  78.0 - 100.0 fL   MCH 31.6  26.0 - 34.0 pg   MCHC 33.2  30.0 - 36.0 g/dL   RDW 13.0  11.5 - 15.5 %   Platelets 148 (*) 150 - 400 K/uL  PROTIME-INR     Status: None   Collection Time    11/28/13  9:30 AM      Result Value Ref Range   Prothrombin Time 13.3  11.6 - 15.2 seconds   INR 1.03  0.00 - 1.49   No results found.   ASSESSMENT: 1. Dyspnea with pulmonary HTN on echo 2. OSA- on CPAP 3. Chronic Respiratory Failure- on chronic 4 liters oxygen  4. SLE- never been treated 5. CKD  PLAN/DISCUSSION: Plan for RHC to further assess hemodynamics.    Amy Estrella Deeds NP-C  10:49 AM  Patient seen and examined with Darrick Grinder, NP. We discussed all aspects of the encounter. I agree with the assessment and plan as stated above. I have edited note to reflect my changes. Proceed with RHC. May need coronary angio at some point as well.   Shaune Pascal Bensimhon,MD 1:07 PM

## 2013-11-28 NOTE — Discharge Instructions (Signed)
Wound Care Wound care helps prevent pain and infection.   HOME CARE   Only take medicine as told by your doctor..  Remove any bandages (dressings) in 24 hours or as told by your doctor.  May take shower in 24 hours. Do not take baths, swim, or do anything that puts your wound under water.Marland Kitchen  Keep all doctor visits as told. GET HELP RIGHT AWAY IF:   Yellowish-white fluid (pus) comes from the wound.  Medicine does not lessen your pain.  There is a red streak going away from the wound.  You have a fever. MAKE SURE YOU:   Understand these instructions.  Will watch your condition.  Will get help right away if you are not doing well or get worse. Document Released: 05/16/2008 Document Revised: 10/30/2011 Document Reviewed: 12/11/2010 [facility] Patient Information 2014 [place], [place].

## 2013-11-28 NOTE — CV Procedure (Signed)
Cardiac Cath Procedure Note:  Indication:  Dyspnea. Pulmonary HTN on echo  Procedures performed:  1) Right heart catheterization  Description of procedure:   The risks and indication of the procedure were explained. Consent was signed and placed on the chart. An appropriate timeout was taken prior to the procedure. The right neck was prepped and draped in the routine sterile fashion and anesthetized with 1% local lidocaine.   A 7 FR venous sheath was placed in the right internal jugular vein using a modified Seldinger technique. A standard Swan-Ganz catheter was used for the procedure.   Complications: None apparent.  Findings:  RA = 4 RV = 32/1/6 PA = 33/11 (20)  PCW = 8 Fick cardiac output/index = 6.1/3.2 PVR = 1.9 [place] Arterial sat = 97% by pulse ox PA sat = 69%, 70%  Assessment:  Normal hemodynamics. No evidence PAH.  Plan/Discussion:  F/u Dr. Lake Bells.   Shaune Pascal [place] Hamrick 2:29 PM

## 2013-12-08 ENCOUNTER — Telehealth: Payer: Self-pay | Admitting: Internal Medicine

## 2013-12-08 MED ORDER — OXYCODONE-ACETAMINOPHEN 10-325 MG PO TABS: 1.0000 | ORAL_TABLET | Freq: Four times a day (QID) | ORAL | Status: DC | PRN

## 2013-12-08 NOTE — Telephone Encounter (Signed)
Pt notified Rx ready for pickup 

## 2013-12-08 NOTE — Telephone Encounter (Signed)
Pt called needing rx for percet Please call when ready for pick up

## 2013-12-08 NOTE — Telephone Encounter (Signed)
Last fill was 11/13/13 ok to fill?

## 2013-12-08 NOTE — Telephone Encounter (Signed)
Ok to refill,  printed rx  

## 2013-12-09 ENCOUNTER — Telehealth: Payer: Self-pay | Admitting: *Deleted

## 2013-12-09 MED ORDER — SE-TAN PLUS 162-115.2-1 MG PO CAPS: 1.0000 | ORAL_CAPSULE | Freq: Every day | ORAL | Status: DC

## 2013-12-09 NOTE — Telephone Encounter (Signed)
Ok to refill,  Refill sent  

## 2013-12-09 NOTE — Telephone Encounter (Signed)
Refill Request  Se-Tan plus capsule   #30   Take one tablet by mouth daily

## 2013-12-09 NOTE — Telephone Encounter (Signed)
Ok refill? Not on med list

## 2013-12-15 ENCOUNTER — Encounter: Payer: Self-pay | Admitting: Pulmonary Disease

## 2013-12-15 ENCOUNTER — Other Ambulatory Visit: Payer: Self-pay | Admitting: *Deleted

## 2013-12-16 ENCOUNTER — Encounter: Payer: Self-pay | Admitting: Pulmonary Disease

## 2013-12-17 ENCOUNTER — Telehealth: Payer: Self-pay

## 2013-12-17 NOTE — Telephone Encounter (Signed)
Spoke with pt, she had been on 4cm H2O.  I advised her of results, said she can adjust it to 8 herself.  Confirmed her appt on Monday.  Nothing further needed.

## 2013-12-17 NOTE — Telephone Encounter (Signed)
Message copied by Len Blalock on Wed Dec 17, 2013  4:18 PM ------      Message from: Simonne Maffucci B      Created: Mon Dec 15, 2013  2:29 AM       A,            Her CPAP study showed that she needed nasal CPAP at 8cm h20.            Can we confirm that she has this set up already at home? She used to have a machine.  If she doesn't have it now then we can re-prescribe.            Thanks      B ------

## 2013-12-18 ENCOUNTER — Other Ambulatory Visit: Payer: Self-pay | Admitting: Internal Medicine

## 2013-12-18 NOTE — Telephone Encounter (Signed)
Electronic Rx request, Please Advise.

## 2013-12-22 ENCOUNTER — Encounter: Payer: Self-pay | Admitting: Pulmonary Disease

## 2013-12-22 ENCOUNTER — Telehealth: Payer: Self-pay

## 2013-12-22 ENCOUNTER — Ambulatory Visit (INDEPENDENT_AMBULATORY_CARE_PROVIDER_SITE_OTHER): Payer: Federal, State, Local not specified - PPO | Admitting: Pulmonary Disease

## 2013-12-22 ENCOUNTER — Telehealth: Payer: Self-pay | Admitting: Internal Medicine

## 2013-12-22 VITALS — BP 128/66 | HR 58 | Temp 98.4°F | Ht 62.5 in | Wt 220.0 lb

## 2013-12-22 DIAGNOSIS — Z9989 Dependence on other enabling machines and devices: Secondary | ICD-10-CM

## 2013-12-22 DIAGNOSIS — M549 Dorsalgia, unspecified: Secondary | ICD-10-CM

## 2013-12-22 DIAGNOSIS — R0902 Hypoxemia: Secondary | ICD-10-CM

## 2013-12-22 DIAGNOSIS — G8929 Other chronic pain: Secondary | ICD-10-CM

## 2013-12-22 DIAGNOSIS — G4733 Obstructive sleep apnea (adult) (pediatric): Secondary | ICD-10-CM

## 2013-12-22 DIAGNOSIS — Z1239 Encounter for other screening for malignant neoplasm of breast: Secondary | ICD-10-CM

## 2013-12-22 DIAGNOSIS — G4734 Idiopathic sleep related nonobstructive alveolar hypoventilation: Secondary | ICD-10-CM

## 2013-12-22 DIAGNOSIS — R0602 Shortness of breath: Secondary | ICD-10-CM

## 2013-12-22 NOTE — Telephone Encounter (Signed)
Please advise 

## 2013-12-22 NOTE — Patient Instructions (Signed)
Decrease your oxygen to 4L at night We will have your CPAP pressure adjusted We will arrange a V/Q scan and call you with the results We will refer you to Dr. Rockey Situ for the abnormal echocardiogram We will refer you to Dr. Jefm Bryant for the abnormal lab work  Use Milta Deiters med saline rinses twice a day, Flonase regularly, claritin regularly; when the sinus symptoms are bad, use phenylephrine over the counter decongestants  We will see you back in a year or sooner if needed

## 2013-12-22 NOTE — Assessment & Plan Note (Signed)
This symptom has improved. The only objective abnormality that I've been able to find at this point is the isolated low DLCO as well as blood work abnormality suggesting lupus or even less likely antiphospholipid antibody syndrome.  After all of our testing we know that she does not have pulmonary hypertension or interstitial lung disease. I question whether or not she has right heart failure (perhaps she has had an MI in the past) but this is clearly not do to pulmonary hypertension.  The only test that we have not performed at this point would be a VQ scan to look for pulmonary embolism. We cannot perform a CT angiogram of her chest because of her chronic kidney disease. Overall, I think the likelihood of pulmonary embolism is low but considering the bloodwork which suggested antiphospholipid antibodies it would be reasonable to check for this.  Plan: -VQ scan of chest -Refer to cardiology for RV dilatation on echo and what looks like RV failure and a loud murmur on physical exam - Referred to rheumatology for possibility of lupus

## 2013-12-22 NOTE — Telephone Encounter (Signed)
Patient notified referral in process and that she would be called with appointment> Patient would like to know if the mammo-gram could be scheduled the same day as appointment with cardiology.

## 2013-12-22 NOTE — Telephone Encounter (Signed)
Referral is in process as requested 

## 2013-12-22 NOTE — Telephone Encounter (Signed)
CXR is ordered for [facility].  Nothing further needed. Thanks Alida!

## 2013-12-22 NOTE — Telephone Encounter (Signed)
Message copied by Len Blalock on Mon Dec 22, 2013  3:05 PM ------      Message from: Alison Stalling      Created: Mon Dec 22, 2013  2:58 PM       Pt is sched for her VQ scan tomorrow 12/23/13, she was told to arrive @ 12 for cxr then VQ sched for 1pm. will need CXR 1st, if you will put in computer I can fax to [facility]. Also Dr Jefm Bryant is booked up until Sept they are converting to Epic and schedule is full, they are recommending Dr Gavin Pound @ [facility] in [place] on [address]. Please advise, pt was already informed and says she is familiar with [facility] as she has seen other MD in that area.              Thanks! Please put cxr order in      [place] --[phone] ------

## 2013-12-22 NOTE — Addendum Note (Signed)
Addended by: Len Blalock on: 12/22/2013 02:38 PM   Modules accepted: Orders

## 2013-12-22 NOTE — Telephone Encounter (Signed)
Pt wanted to get a order for mammogram  She gets this done @ [place]

## 2013-12-22 NOTE — Progress Notes (Signed)
Subjective:    Patient ID: [name], female    DOB: 12/05/1940, 73 y.o.   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  As of 10/2013 her PFTs, CT chest, and echo suggested pulmonary hypertension.  She had a history of SLE (diagnosed as a young adult but never treated).  HPI    12/22/2013 ROV > since the last visit [name] has not had much trouble with shortness of breath. She continues to stay active. However, she has noted increasing leg swelling. Her right heart catheterization did not show evidence of pulmonary hypertension. She turned up the oxygen on her oxygen concentrator to 8 L per minute when we called her last week. She did not understand that we were calling about her CPAP machine. Overall there has not been too many acute changes since the last visit. She is not smoking cigarettes.  Past Medical History  Diagnosis Date  . Degenerative disc disease     HERNITAED DISC BY MRI ON 07/07;S/P LAMINECTOMY,RID REPLACEMENT  . Brain aneurysm     S/P RESECTION  . Discoid lupus erythematosus eyelid     NEGATIVE SEROLOGIC MARKERS  . Hypercholesterolemia   . Chronic sinusitis   . Hypertension   . Dysrhythmia     PVC's occ.  . Pneumonia 05/30/2010    after receiving Propofol  . Headache(784.0)     migraines occ.  . CHF (congestive heart failure) 05/2010    after receiving Propofol  . Hypothyroidism     takes Synthroid  . Blood transfusion 2009    back surgery  . Asthma 06/2010    depending on what around  . Complication of anesthesia 05/30/2010    from colonoscopy-had Propofol-had a  . Complication of anesthesia     reaction-went into asthma-pneumonia,-  . Complication of anesthesia     then CHF and to ICU  . Sleep apnea     uses CPAP-8.4-oxygen suppliment     Review of Systems  Constitutional: Positive for fatigue. Negative for  fever and chills.  HENT: Negative for postnasal drip, rhinorrhea and sinus pressure.   Respiratory: Positive for shortness of breath. Negative for cough and wheezing.   Cardiovascular: Positive for leg swelling. Negative for chest pain and palpitations.       Objective:   Physical Exam  Filed Vitals:   12/22/13 1058  BP: 128/66  Pulse: 58  Temp: 98.4 F (36.9 C)  TempSrc: Oral  Height: 5' 2.5" (1.588 m)  Weight: 220 lb (99.791 kg)  SpO2: 95%  RA  Gen: well appearing, no acute distress HEENT: NCAT,  EOMi, OP clear, PULM: Clear bilaterally CV: RRR, systolic murmur noted, no JVD AB: BS+, soft, nontender, no hsm Ext: warm, increased ankle edema, no clubbing, no cyanosis   10/2013 PFT> Ratio 85% FEV1 1.85L (101% pred, -2% change with BD), TLC 3.89L (88% pred), ERV 0.20 L (23% pred), DLCO 8.9 (43% pred) 10/2013 CT chest [place] read> no ILD; mild post infectious or inflammatory scarring in lingula; 3 vessel coronary disease 2015 TTE [facility] > LVEF normal, RVSP 55-64mmHg, RV dilation and mildly decreased function      Assessment & Plan:   Shortness of breath This symptom has improved. The only objective abnormality that I've been able to find at this point is the isolated low DLCO as well as blood work abnormality suggesting lupus  or even less likely antiphospholipid antibody syndrome.  After all of our testing we know that she does not have pulmonary hypertension or interstitial lung disease. I question whether or not she has right heart failure (perhaps she has had an MI in the past) but this is clearly not do to pulmonary hypertension.  The only test that we have not performed at this point would be a VQ scan to look for pulmonary embolism. We cannot perform a CT angiogram of her chest because of her chronic kidney disease. Overall, I think the likelihood of pulmonary embolism is low but considering the bloodwork which suggested antiphospholipid antibodies it would be reasonable to  check for this.  Plan: -VQ scan of chest -Refer to cardiology for RV dilatation on echo and what looks like RV failure and a loud murmur on physical exam - Referred to rheumatology for possibility of lupus  OSA on CPAP Continue 4 L of oxygen at night We will adjust her CPAP machine to 8 cm of water. She mistakenly told us recently on the phone that she was able to do this herself. We will have her home durable medical equipment Company do this for her.    Updated Medication List Outpatient Encounter Prescriptions as of 12/22/2013  Medication Sig  . amLODipine (NORVASC) 10 MG tablet Take 10 mg by mouth daily.  . Ascorbic Acid (VITAMIN C) 1000 MG tablet Take 500 mg by mouth daily.   . benzonatate (TESSALON) 100 MG capsule Take 100 mg by mouth 2 (two) times daily.  . cyanocobalamin (,VITAMIN B-12,) 1000 MCG/ML injection Inject 1 mL (1,000 mcg total) into the muscle every 30 (thirty) days.  Marland Kitchen desloratadine (CLARINEX) 5 MG tablet Take 5 mg by mouth daily.  . diazepam (VALIUM) 5 MG tablet Take 5 mg by mouth every 12 (twelve) hours as needed for muscle spasms. TAKE 1 TABLET TWICE DAILY  . doxepin (SINEQUAN) 25 MG capsule Take 2 capsules (50 mg total) by mouth at bedtime.  Marland Kitchen escitalopram (LEXAPRO) 10 MG tablet Take 10 mg by mouth daily. TAKE 1 TABLET (10 MG TOTAL) BY MOUTH EVERY MORNING.  . FeFum-FePo-FA-B Cmp-C-Zn-Mn-Cu (SE-TAN PLUS) 162-115.2-1 MG CAPS Take 1 capsule by mouth daily.  . furosemide (LASIX) 20 MG tablet Take 20-40 mg by mouth daily as needed for fluid.  . isosorbide mononitrate (IMDUR) 60 MG 24 hr tablet Take 60 mg by mouth daily. TAKE ONE BY MOUTH DAILY  . levothyroxine (SYNTHROID) 150 MCG tablet Take 150 mcg by mouth daily before breakfast. TAKE 1 TABLET (150 MCG TOTAL) BY MOUTH DAILY.  Marland Kitchen lidocaine (LIDODERM) 5 % Place 3 patches onto the skin daily. PLACE 3 PATCHES ONTO THE SKIN DAILY. REMOVE & DISCARD PATCH WITHIN 12 HOURS OR AS DIRECTED BY MD  . losartan (COZAAR) 50 MG tablet  Take 1 tablet (50 mg total) by mouth 2 (two) times daily.  . montelukast (SINGULAIR) 10 MG tablet Take 10 mg by mouth at bedtime. TAKE 1 TABLET (10 MG TOTAL) BY MOUTH AT BEDTIME.  . nebivolol (BYSTOLIC) 5 MG tablet Take 5 mg by mouth daily.  Marland Kitchen NEEDLE, DISP, 23 G (BD DISP NEEDLE) 23G X 1" MISC Uses to inject b12 into muscle once monthly  . omega-3 acid ethyl esters (LOVAZA) 1 G capsule Take 2 g by mouth 2 (two) times daily. TAKE 2 CAPSULES TWICE A DAY  . OVER THE COUNTER MEDICATION Take 1 capsule by mouth daily. Florify  . oxyCODONE-acetaminophen (PERCOCET) 10-325 MG per tablet Take 1 tablet  by mouth every 6 (six) hours as needed for pain.  . rosuvastatin (CRESTOR) 20 MG tablet Take 20 mg by mouth daily. TAKE 1 TABLET (20 MG TOTAL) BY MOUTH AT BEDTIME.  . sodium chloride (OCEAN) 0.65 % nasal spray Place 2 sprays into the nose as needed. Allergies    . spironolactone (ALDACTONE) 25 MG tablet Take 25 mg by mouth daily.  Marland Kitchen zolpidem (AMBIEN) 5 MG tablet TAKE 1 TABLET BY MOUTH AT BEDTIME prn  . [DISCONTINUED] AMBIEN 5 MG tablet TAKE 1 TABLET BY MOUTH AT BEDTIME

## 2013-12-22 NOTE — Assessment & Plan Note (Signed)
Continue 4 L of oxygen at night We will adjust her CPAP machine to 8 cm of water. She mistakenly told us recently on the phone that she was able to do this herself. We will have her home durable medical equipment Company do this for her.

## 2013-12-23 ENCOUNTER — Telehealth: Payer: Self-pay

## 2013-12-23 ENCOUNTER — Telehealth: Payer: Self-pay | Admitting: Pulmonary Disease

## 2013-12-23 NOTE — Telephone Encounter (Signed)
This has been resent. Nothing further needed.  

## 2013-12-23 NOTE — Telephone Encounter (Signed)
Will forward this message to BQ so that he will be aware.

## 2013-12-23 NOTE — Telephone Encounter (Signed)
noted 

## 2013-12-23 NOTE — Addendum Note (Signed)
Addended by: Len Blalock on: 12/23/2013 12:35 PM   Modules accepted: Orders

## 2013-12-23 NOTE — Telephone Encounter (Signed)
Message copied by Len Blalock on Tue Dec 23, 2013  1:23 PM ------      Message from: Alison Stalling      Created: Tue Dec 23, 2013 10:48 AM      Regarding: referral Dr Barbaraann Cao, I will need a corrected referral for Rheumatology, to say refer to Dr Trudie Reed, as I will need to fax office notes etc to them before they schedule appt. In dx he has shortness of breath, if he can address more of rheumatoid for this referral it would help, Thanks! ------

## 2013-12-25 ENCOUNTER — Encounter: Payer: Self-pay | Admitting: *Deleted

## 2013-12-26 ENCOUNTER — Telehealth: Payer: Self-pay | Admitting: Pulmonary Disease

## 2013-12-26 NOTE — Telephone Encounter (Addendum)
No. I missed this order in the referral section of the patient chart when I was reviewing earlier.   Spoke with Tammy at [facility]--Tammy states that they did in fact receive order and will contact patient to have her pressure changed.  They are going to contact that patient to make this change.  Pt aware.  Nothing further needed.

## 2013-12-26 NOTE — Telephone Encounter (Signed)
I think Caryl Pina has already done this

## 2013-12-26 NOTE — Telephone Encounter (Signed)
Pt calling requesting CPAP pressure change. Pt states that she spoke with [facility] and they are stating that they are needing an Rx for the changes requested--fax to [phone]  See last OV notes below from 5.4.15 Patient Instructions     Decrease your oxygen to 4L at night  We will have your CPAP pressure adjusted  We will arrange a V/Q scan and call you with the results  We will refer you to Dr. Rockey Situ for the abnormal echocardiogram  We will refer you to Dr. Jefm Bryant for the abnormal lab work  Use Milta Deiters med saline rinses twice a day, Flonase regularly, claritin regularly; when the sinus symptoms are bad, use phenylephrine over the counter decongestants  We will see you back in a year or sooner if needed   Dr Lake Bells please advise what changes need to be made to pt CPAP so that order may be placed. Thanks.

## 2013-12-26 NOTE — Telephone Encounter (Signed)
This was ordered at her last ov on 12/22/13.  Do I need to call her DME for this?

## 2013-12-31 ENCOUNTER — Encounter: Payer: Self-pay | Admitting: Cardiovascular Disease

## 2013-12-31 ENCOUNTER — Ambulatory Visit (INDEPENDENT_AMBULATORY_CARE_PROVIDER_SITE_OTHER): Payer: Federal, State, Local not specified - PPO | Admitting: Cardiovascular Disease

## 2013-12-31 VITALS — BP 121/61 | HR 49 | Ht 62.5 in | Wt 214.8 lb

## 2013-12-31 DIAGNOSIS — R001 Bradycardia, unspecified: Secondary | ICD-10-CM | POA: Insufficient documentation

## 2013-12-31 DIAGNOSIS — E785 Hyperlipidemia, unspecified: Secondary | ICD-10-CM

## 2013-12-31 DIAGNOSIS — R0602 Shortness of breath: Secondary | ICD-10-CM

## 2013-12-31 DIAGNOSIS — Z87891 Personal history of nicotine dependence: Secondary | ICD-10-CM

## 2013-12-31 DIAGNOSIS — E669 Obesity, unspecified: Secondary | ICD-10-CM

## 2013-12-31 DIAGNOSIS — I10 Essential (primary) hypertension: Secondary | ICD-10-CM

## 2013-12-31 DIAGNOSIS — R011 Cardiac murmur, unspecified: Secondary | ICD-10-CM | POA: Insufficient documentation

## 2013-12-31 DIAGNOSIS — I498 Other specified cardiac arrhythmias: Secondary | ICD-10-CM

## 2013-12-31 NOTE — Progress Notes (Signed)
Patient ID: [name], female    DOB: 1940/12/13, 73 y.o.   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 She was referred by Dr. Lake Bells, pulmonary  Overall she reports that she feels well. Right heart catheterization last month showed pulmonary capillary wedge pressure of 8, PA pressure 33/11. She denies having significant shortness of breath with exertion. She denies having any COPD despite a long smoking history. She is unclear why previous echocardiogram in 2011 suggested moderate pulmonary hypertension. She has numerous issues with arthritis with history of several surgeries particularly on her feet. History of total knee replacement 2012, bunionectomy 2012, aneurysm with coiling 2005, several laminectomies and fusions dating back to 1968, 1988, 2002, 2009  She does report having some fatigue. Wonders if it could be from overmedication She does have obstructive sleep apnea and uses a CPAP faithfully.  EKG shows sinus bradycardia with rate 49 beats per minute, poor R-wave progression through the precordial leads, left axis deviation    Outpatient Encounter Prescriptions as of 12/31/2013  Medication Sig  . amLODipine (NORVASC) 10 MG tablet Take 10 mg by mouth daily.  . Ascorbic Acid (VITAMIN C) 1000 MG tablet Take 500 mg by mouth daily.   . benzonatate (TESSALON) 100 MG capsule Take 100 mg by mouth 2 (two) times daily.  . cyanocobalamin (,VITAMIN B-12,) 1000 MCG/ML injection Inject 1 mL (1,000 mcg total) into the muscle every 30 (thirty) days.  Marland Kitchen desloratadine (CLARINEX) 5 MG tablet Take 5 mg by mouth daily.  .  diazepam (VALIUM) 5 MG tablet Take 5 mg by mouth every 12 (twelve) hours as needed for muscle spasms. TAKE 1 TABLET TWICE DAILY  . doxepin (SINEQUAN) 25 MG capsule Take 2 capsules (50 mg total) by mouth at bedtime.  Marland Kitchen escitalopram (LEXAPRO) 10 MG tablet Take 10 mg by mouth daily. TAKE 1 TABLET (10 MG TOTAL) BY MOUTH EVERY MORNING.  . FeFum-FePo-FA-B Cmp-C-Zn-Mn-Cu (SE-TAN PLUS) 162-115.2-1 MG CAPS Take 1 capsule by mouth daily.  . furosemide (LASIX) 20 MG tablet Take 20-40 mg by mouth daily as needed for fluid.  . isosorbide mononitrate (IMDUR) 60 MG 24 hr tablet Take 60 mg by mouth daily. TAKE ONE BY MOUTH DAILY  . levothyroxine (SYNTHROID) 150 MCG tablet Take 150 mcg by mouth daily before breakfast. TAKE 1 TABLET (150 MCG TOTAL) BY MOUTH DAILY.  Marland Kitchen lidocaine (LIDODERM) 5 % Place 3 patches onto the skin daily. PLACE 3 PATCHES ONTO THE SKIN DAILY. REMOVE & DISCARD PATCH WITHIN 12 HOURS OR AS DIRECTED BY MD  . losartan (COZAAR) 50 MG tablet Take 1 tablet (50 mg total) by mouth 2 (two) times daily.  . montelukast (SINGULAIR) 10 MG tablet Take 10 mg by mouth at bedtime. TAKE 1 TABLET (10 MG TOTAL) BY MOUTH AT BEDTIME.  . nebivolol (BYSTOLIC) 5 MG tablet Take 5 mg by mouth daily.  Marland Kitchen NEEDLE, DISP, 23 G (BD DISP NEEDLE) 23G X 1" MISC Uses to inject b12 into muscle once monthly  . omega-3 acid ethyl esters (LOVAZA) 1 G capsule Take 2 g by mouth 2 (two) times daily. TAKE 2 CAPSULES TWICE A DAY  . OVER THE COUNTER MEDICATION Take  1 capsule by mouth daily. Florify  . oxyCODONE-acetaminophen (PERCOCET) 10-325 MG per tablet Take 1 tablet by mouth every 6 (six) hours as needed for pain.  . rosuvastatin (CRESTOR) 20 MG tablet Take 20 mg by mouth daily. TAKE 1 TABLET (20 MG TOTAL) BY MOUTH AT BEDTIME.  . sodium chloride (OCEAN) 0.65 % nasal spray Place 2 sprays into the nose as needed. Allergies    . spironolactone (ALDACTONE) 25 MG tablet Take 25 mg by mouth daily.  Marland Kitchen zolpidem (AMBIEN) 5 MG tablet TAKE 1 TABLET  BY MOUTH AT BEDTIME prn    Review of Systems  Constitutional: Negative.   HENT: Negative.   Eyes: Negative.   Respiratory: Negative.   Cardiovascular: Negative.   Gastrointestinal: Negative.   Endocrine: Negative.   Musculoskeletal: Positive for arthralgias, back pain and joint swelling.  Skin: Negative.   Allergic/Immunologic: Negative.   Neurological: Negative.   Hematological: Negative.   Psychiatric/Behavioral: Negative.   All other systems reviewed and are negative.   BP 121/61  Pulse 49  Ht 5' 2.5" (1.588 m)  Wt 214 lb 12.8 oz (97.433 kg)  BMI 38.64 kg/m2  Physical Exam  Nursing note and vitals reviewed. Constitutional: She is oriented to person, place, and time. She appears well-developed and well-nourished.  HENT:  Head: Normocephalic.  Nose: Nose normal.  Mouth/Throat: Oropharynx is clear and moist.  Eyes: Conjunctivae are normal. Pupils are equal, round, and reactive to light.  Neck: Normal range of motion. Neck supple. No JVD present.  Cardiovascular: Regular rhythm, S1 normal, S2 normal and intact distal pulses.  Bradycardia present.  Exam reveals no gallop and no friction rub.   Murmur heard.  Systolic murmur is present with a grade of 2/6  Pulmonary/Chest: Effort normal and breath sounds normal. No respiratory distress. She has no wheezes. She has no rales. She exhibits no tenderness.  Abdominal: Soft. Bowel sounds are normal. She exhibits no distension. There is no tenderness.  Musculoskeletal: Normal range of motion. She exhibits no edema and no tenderness.  Lymphadenopathy:    She has no cervical adenopathy.  Neurological: She is alert and oriented to person, place, and time. Coordination normal.  Skin: Skin is warm and dry. No rash noted. No erythema.  Psychiatric: She has a normal mood and affect. Her behavior is normal. Judgment and thought content normal.    Assessment and Plan

## 2013-12-31 NOTE — Assessment & Plan Note (Signed)
Heart rate is slowed today. She does have some fatigue. Recommended she hold the beta blocker

## 2013-12-31 NOTE — Assessment & Plan Note (Signed)
Blood pressure on the low end. Heart rate very low. We have suggested she hold her bystolic and closely monitor her heart rate and blood pressure.

## 2013-12-31 NOTE — Assessment & Plan Note (Signed)
Low-grade murmur. Given her prior history which involves a long recent hospital course, prior moderate pulmonary hypertension and 2011 (no recent numbers suggest normal right heart pressures), some shortness of breath and fatigue, echocardiogram has been ordered.

## 2013-12-31 NOTE — Assessment & Plan Note (Signed)
Encouraged her to stay on her Crestor. High risk for CAD given her long smoking history.

## 2013-12-31 NOTE — Assessment & Plan Note (Signed)
We have encouraged continued exercise, careful diet management in an effort to lose weight. 

## 2013-12-31 NOTE — Assessment & Plan Note (Signed)
Complemented her on smoking cessation

## 2013-12-31 NOTE — Assessment & Plan Note (Signed)
Prior echocardiogram in 2011. New Echo pending. She denies having any shortness of breath on today's visit. Recent right heart catheterization was normal Talked about weight, regular daily exercise. She is limited by her arthritis

## 2013-12-31 NOTE — Patient Instructions (Signed)
You are doing well.  Please hold the bystolic Monitor your blood pressure and heart rate  Echocardiogaram has been ordered for murmur  Please call us if you have new issues that need to be addressed before your next appt.

## 2014-01-01 ENCOUNTER — Telehealth: Payer: Self-pay | Admitting: Pulmonary Disease

## 2014-01-01 NOTE — Telephone Encounter (Signed)
Pt states that [facility] has not received our order for CPAP pressure change- she spoke with them today and stated they have not received our fax and that we needed to resend it. Order placed 12/22/13 Pt uses [facility]. Please get her nasal CPAP at 8cm h20 based on sleep study. Thank you.  Need order re-faxed to DME [facility] (F) [phone]   Pt also wants to let Dr Lake Bells know that she saw Cardologist 5/53/74--M/O pts Bystolic.

## 2014-01-05 ENCOUNTER — Telehealth: Payer: Self-pay

## 2014-01-05 ENCOUNTER — Other Ambulatory Visit: Payer: Self-pay

## 2014-01-05 ENCOUNTER — Other Ambulatory Visit (INDEPENDENT_AMBULATORY_CARE_PROVIDER_SITE_OTHER): Payer: Federal, State, Local not specified - PPO

## 2014-01-05 ENCOUNTER — Telehealth: Payer: Self-pay | Admitting: Internal Medicine

## 2014-01-05 DIAGNOSIS — I10 Essential (primary) hypertension: Secondary | ICD-10-CM

## 2014-01-05 DIAGNOSIS — R0602 Shortness of breath: Secondary | ICD-10-CM

## 2014-01-05 DIAGNOSIS — R011 Cardiac murmur, unspecified: Secondary | ICD-10-CM

## 2014-01-05 NOTE — Telephone Encounter (Signed)
Patient was told to come off the Bystolic because of decreased HR and to keep a check on her Heart Rate & Blood Pressure. Today BP & HR checked during Echo is 140/60 HR 69 and yesterday her reading was 137/87 HR 81. Please advise if she needs to change or do anything different with her medications. The patient is not having any problems.

## 2014-01-05 NOTE — Telephone Encounter (Signed)
Pt called to let know that dr gallon took her off byslotic because her pulse was low

## 2014-01-05 NOTE — Telephone Encounter (Signed)
FYI

## 2014-01-05 NOTE — Telephone Encounter (Signed)
Spoke with patient today in office after her Echo, told patient per Dr. Rockey Situ her BP & HR readings look good and to stay off the Bystolic. The patient was instructed to keep a record of her BP & HR readings for a couple of weeks and bring those readings to the office for Dr. Rockey Situ to review. The patient understands to contact our office if she has in symptoms or notice her heart decreasing or increasing. Told the patient someone will contact her regarding her Echo results.

## 2014-01-06 ENCOUNTER — Other Ambulatory Visit: Payer: Self-pay | Admitting: Internal Medicine

## 2014-01-06 NOTE — Telephone Encounter (Signed)
noted 

## 2014-01-13 ENCOUNTER — Telehealth: Payer: Self-pay | Admitting: Internal Medicine

## 2014-01-13 MED ORDER — OXYCODONE-ACETAMINOPHEN 10-325 MG PO TABS
1.0000 | ORAL_TABLET | Freq: Four times a day (QID) | ORAL | Status: DC | PRN
Start: 2014-01-13 — End: 2014-01-15

## 2014-01-13 NOTE — Telephone Encounter (Signed)
oxyCODONE-acetaminophen (PERCOCET) 10-325 MG per tablet

## 2014-01-13 NOTE — Telephone Encounter (Signed)
Last visit 10/10/13, ok refill?

## 2014-01-13 NOTE — Telephone Encounter (Signed)
Ok to refill,  printed rx  

## 2014-01-13 NOTE — Telephone Encounter (Signed)
lmtcb x1 

## 2014-01-13 NOTE — Telephone Encounter (Signed)
Left message, notifying pt that Rx ready for pickup

## 2014-01-15 ENCOUNTER — Telehealth: Payer: Self-pay | Admitting: Internal Medicine

## 2014-01-15 MED ORDER — OXYCODONE-ACETAMINOPHEN 10-325 MG PO TABS: 1.0000 | ORAL_TABLET | Freq: Four times a day (QID) | ORAL | Status: DC | PRN

## 2014-01-15 NOTE — Telephone Encounter (Signed)
Not necessary   Give her the script for 120 and we will move the refill date out to 5 weeks fo rthe next one

## 2014-01-15 NOTE — Telephone Encounter (Signed)
Pt called to check status of oxycodone refill.  Advised script ready for pick up.  Pt asks quantity and states she is usually prescribed #120.  Pt states she will be by to pick up on Tuesday, 6/2, and would like a script for more than #30.

## 2014-01-15 NOTE — Telephone Encounter (Signed)
Patient has picked up and filled script for thirty pills so will need script for 90, I have the script for 120.

## 2014-01-15 NOTE — Telephone Encounter (Signed)
Please advise 

## 2014-01-15 NOTE — Telephone Encounter (Signed)
Ok to refill,  printed rx   Please destroy the previous one for #30

## 2014-01-16 NOTE — Telephone Encounter (Signed)
Script placed at. front for patient pick up and patient notified as requested.

## 2014-01-19 ENCOUNTER — Other Ambulatory Visit: Payer: Self-pay | Admitting: *Deleted

## 2014-01-19 MED ORDER — ESCITALOPRAM OXALATE 10 MG PO TABS: 10.0000 mg | ORAL_TABLET | Freq: Every day | ORAL | Status: DC

## 2014-01-19 MED ORDER — ISOSORBIDE MONONITRATE ER 60 MG PO TB24: 60.0000 mg | ORAL_TABLET | Freq: Every day | ORAL | Status: DC

## 2014-01-19 MED ORDER — ROSUVASTATIN CALCIUM 20 MG PO TABS
20.0000 mg | ORAL_TABLET | Freq: Every day | ORAL | Status: DC
Start: 2014-01-19 — End: 2014-07-04

## 2014-01-20 ENCOUNTER — Ambulatory Visit: Payer: Self-pay | Admitting: Internal Medicine

## 2014-01-21 ENCOUNTER — Other Ambulatory Visit: Payer: Self-pay | Admitting: *Deleted

## 2014-01-21 MED ORDER — BENZONATATE 100 MG PO CAPS: 100.0000 mg | ORAL_CAPSULE | Freq: Two times a day (BID) | ORAL | Status: DC

## 2014-01-21 NOTE — Telephone Encounter (Signed)
Okay to fill? LAst seen in February

## 2014-01-21 NOTE — Telephone Encounter (Signed)
Ok to refill,  Refill sent  

## 2014-01-21 NOTE — Telephone Encounter (Signed)
Okay to fill? Last seen in February for a follow-up. Was not seen or treated for cough even at that time. Please advise

## 2014-01-26 ENCOUNTER — Telehealth: Payer: Self-pay | Admitting: *Deleted

## 2014-01-26 ENCOUNTER — Telehealth: Payer: Self-pay | Admitting: Pulmonary Disease

## 2014-01-26 DIAGNOSIS — R0902 Hypoxemia: Secondary | ICD-10-CM

## 2014-01-26 NOTE — Telephone Encounter (Signed)
Informed patient that her vitals are normal  She stated that her heart rate is usually not in the 80's so she was worried but it is down now  I told her to call if she had any further questions to please call

## 2014-01-26 NOTE — Telephone Encounter (Signed)
Patient called and bp 143/63 88 pulse. Please call patient.

## 2014-01-26 NOTE — Telephone Encounter (Signed)
Per OV 12/22/13: Patient Instructions      Decrease your oxygen to 4L at night We will have your CPAP pressure adjusted We will arrange a V/Q scan and call you with the results We will refer you to Dr. Rockey Situ for the abnormal echocardiogram We will refer you to Dr. Jefm Bryant for the abnormal lab work Use Milta Deiters med saline rinses twice a day, Flonase regularly, claritin regularly; when the sinus symptoms are bad, use phenylephrine over the counter decongestants We will see you back in a year or sooner if needed  ---  Called spoke w/ pt. She was scheduled for VQ scan over at [facility] and had NS. She needs this r/s. Please advise PCC's thanks

## 2014-01-27 NOTE — Telephone Encounter (Signed)
Yes, please re-order Reason: hypoxemia

## 2014-01-27 NOTE — Telephone Encounter (Signed)
Spoke with [facility], this pt was sched for 12/23/13 @ 1pm for VQ at [facility]. Referral is not in box anymore, Dr Lake Bells, if you want pt to have please put another order in for this test, per last OV  Visit note. And I can schedule

## 2014-01-28 ENCOUNTER — Other Ambulatory Visit: Payer: Self-pay | Admitting: Pulmonary Disease

## 2014-01-28 DIAGNOSIS — G4734 Idiopathic sleep related nonobstructive alveolar hypoventilation: Secondary | ICD-10-CM

## 2014-01-28 NOTE — Telephone Encounter (Signed)
Order has been placed. Please advise PCC's thanks 

## 2014-02-02 LAB — CBC AND DIFFERENTIAL
HEMATOCRIT: 38 % (ref 36–46)
HEMOGLOBIN: 12.1 g/dL (ref 12.0–16.0)
PLATELETS: 158 10*3/uL (ref 150–399)
WBC: 8.6 10^3/mL

## 2014-02-02 LAB — BASIC METABOLIC PANEL
BUN: 28 mg/dL — AB (ref 4–21)
Creatinine: 1.5 mg/dL — AB (ref 0.5–1.1)
Glucose: 94 mg/dL
POTASSIUM: 4.3 mmol/L (ref 3.4–5.3)
Sodium: 140 mmol/L (ref 137–147)

## 2014-02-02 LAB — POCT ERYTHROCYTE SEDIMENTATION RATE, NON-AUTOMATED: Sed Rate: 32 mm

## 2014-02-06 ENCOUNTER — Encounter: Payer: Self-pay | Admitting: Internal Medicine

## 2014-02-09 ENCOUNTER — Telehealth: Payer: Self-pay | Admitting: Internal Medicine

## 2014-02-12 ENCOUNTER — Ambulatory Visit: Payer: Self-pay | Admitting: Pulmonary Disease

## 2014-02-17 ENCOUNTER — Encounter: Payer: Self-pay | Admitting: Pulmonary Disease

## 2014-02-17 ENCOUNTER — Other Ambulatory Visit: Payer: Self-pay | Admitting: *Deleted

## 2014-02-17 MED ORDER — ISOSORBIDE MONONITRATE ER 60 MG PO TB24: 60.0000 mg | ORAL_TABLET | Freq: Every day | ORAL | Status: DC

## 2014-02-19 ENCOUNTER — Telehealth: Payer: Self-pay

## 2014-02-19 ENCOUNTER — Telehealth: Payer: Self-pay | Admitting: *Deleted

## 2014-02-19 NOTE — Telephone Encounter (Signed)
Pt called requesting Percocet refill.  Last refill 5.28.15, last OV 2.20.15, no future OV.  please advise refill.

## 2014-02-19 NOTE — Telephone Encounter (Signed)
Message copied by Len Blalock on Thu Feb 19, 2014  5:18 PM ------      Message from: Juanito Doom      Created: Tue Feb 17, 2014 11:26 PM       A,            Please let her know that her V/Q scan was normal            Thanks      B ------

## 2014-02-19 NOTE — Telephone Encounter (Signed)
lmtcb X1 to relay results. 

## 2014-02-20 ENCOUNTER — Other Ambulatory Visit: Payer: Self-pay | Admitting: Internal Medicine

## 2014-02-20 MED ORDER — OXYCODONE-ACETAMINOPHEN 10-325 MG PO TABS: 1.0000 | ORAL_TABLET | Freq: Four times a day (QID) | ORAL | Status: DC | PRN

## 2014-02-20 NOTE — Telephone Encounter (Signed)
Ok to refill,  printed rx . Will need a 6 month follow up by end of August.

## 2014-02-23 ENCOUNTER — Other Ambulatory Visit: Payer: Self-pay | Admitting: Internal Medicine

## 2014-02-23 NOTE — Telephone Encounter (Signed)
Spoke with pt, aware Rx ready; appointment scheduled.

## 2014-02-24 ENCOUNTER — Other Ambulatory Visit: Payer: Self-pay | Admitting: *Deleted

## 2014-02-24 MED ORDER — NEBIVOLOL HCL 5 MG PO TABS: 5.0000 mg | ORAL_TABLET | Freq: Every day | ORAL | Status: DC

## 2014-02-26 NOTE — Telephone Encounter (Signed)
Called, spoke with pt.  Informed her of results per BQ.  She verbalized understanding.  Pt stated BQ wanted to see her back in 1 yr - inquiring about scheduling this appt.  Advised schedule is not out yet.  No recall was in system either.  I could not back date the recall, so I placed it as a future recall to when the yearly follow up will be due.  Pt aware and will call back if anything is needed prior to this.

## 2014-03-05 ENCOUNTER — Other Ambulatory Visit: Payer: Self-pay | Admitting: *Deleted

## 2014-03-05 MED ORDER — AMLODIPINE BESYLATE 10 MG PO TABS: 10.0000 mg | ORAL_TABLET | Freq: Every day | ORAL | Status: DC

## 2014-03-06 ENCOUNTER — Telehealth: Payer: Self-pay | Admitting: Internal Medicine

## 2014-03-06 NOTE — Telephone Encounter (Signed)
FYI

## 2014-03-06 NOTE — Telephone Encounter (Signed)
Left message for patient to return call to office. 

## 2014-03-06 NOTE — Telephone Encounter (Signed)
Pt called asked her if we can change, and she said she did not want the generic lipitor that it doesn't do well with her

## 2014-03-06 NOTE — Telephone Encounter (Signed)
Her insurance is requesting we change her crestor to generic Lipitor,   For cost savings.  Is she ok with that?

## 2014-03-07 NOTE — Telephone Encounter (Signed)
We will continue Crestor for now, and when they increase her copay we'll have to do a PA

## 2014-03-10 NOTE — Telephone Encounter (Signed)
Notified. 

## 2014-03-13 ENCOUNTER — Other Ambulatory Visit: Payer: Self-pay | Admitting: Internal Medicine

## 2014-03-19 ENCOUNTER — Encounter: Payer: Self-pay | Admitting: Pulmonary Disease

## 2014-03-24 ENCOUNTER — Encounter: Payer: Medicare Other | Admitting: Internal Medicine

## 2014-03-24 MED ORDER — ZOLPIDEM TARTRATE 5 MG PO TABS: ORAL_TABLET | ORAL | Status: DC

## 2014-03-24 MED ORDER — OXYCODONE-ACETAMINOPHEN 10-325 MG PO TABS: 1.0000 | ORAL_TABLET | Freq: Four times a day (QID) | ORAL | Status: DC | PRN

## 2014-04-01 ENCOUNTER — Other Ambulatory Visit: Payer: Self-pay | Admitting: *Deleted

## 2014-04-01 MED ORDER — SPIRONOLACTONE 25 MG PO TABS: 25.0000 mg | ORAL_TABLET | Freq: Every day | ORAL | Status: DC

## 2014-04-01 MED ORDER — LIDOCAINE 5 % EX PTCH: 3.0000 | MEDICATED_PATCH | CUTANEOUS | Status: DC

## 2014-04-01 NOTE — Telephone Encounter (Signed)
Ok to refill,  Refill sent  

## 2014-04-01 NOTE — Telephone Encounter (Signed)
Refill

## 2014-04-06 ENCOUNTER — Other Ambulatory Visit: Payer: Self-pay | Admitting: Internal Medicine

## 2014-04-11 ENCOUNTER — Other Ambulatory Visit: Payer: Self-pay | Admitting: Internal Medicine

## 2014-04-12 ENCOUNTER — Telehealth: Payer: Self-pay | Admitting: Internal Medicine

## 2014-04-12 DIAGNOSIS — M06049 Rheumatoid arthritis without rheumatoid factor, unspecified hand: Secondary | ICD-10-CM

## 2014-04-12 DIAGNOSIS — L930 Discoid lupus erythematosus: Secondary | ICD-10-CM | POA: Insufficient documentation

## 2014-04-12 IMAGING — US US EXTREM LOW VENOUS*R*
1 series · 14 of 24 positions shown · non-contrast
Comparison: None

CLINICAL DATA: Edema

EXAM:
RIGHT LOWER EXTREMITY VENOUS DOPPLER ULTRASOUND
TECHNIQUE: Gray-scale sonography with graded compression, as well as color
Doppler and duplex ultrasound, were performed to evaluate the deep
venous system from the level of the common femoral vein through the
popliteal and proximal calf veins. Spectral Doppler was utilized to
evaluate flow at rest and with distal augmentation maneuvers.

[Series 1: us extrem low venous*right* · 0.08mm/px · 14 of 40 slices shown]
[im 1/40]
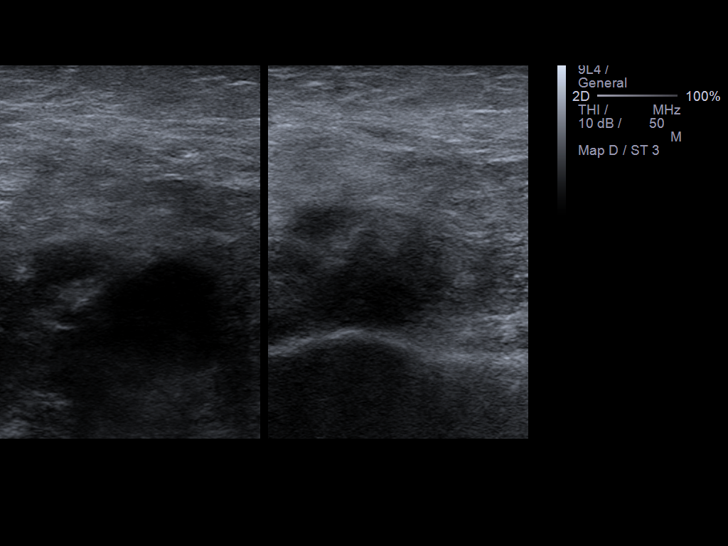
[im 4/40]
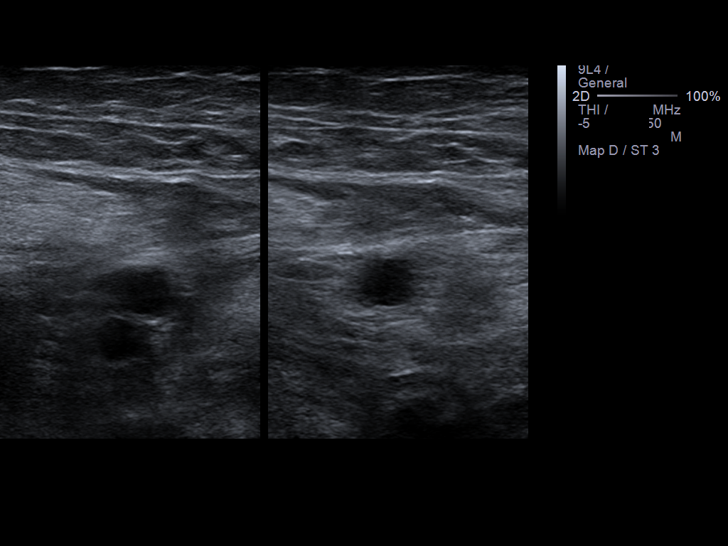
[im 7/40]
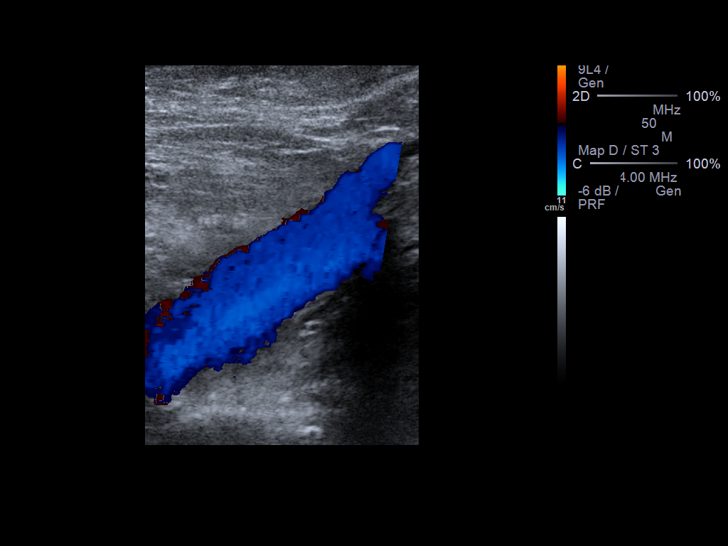
[im 11/40]
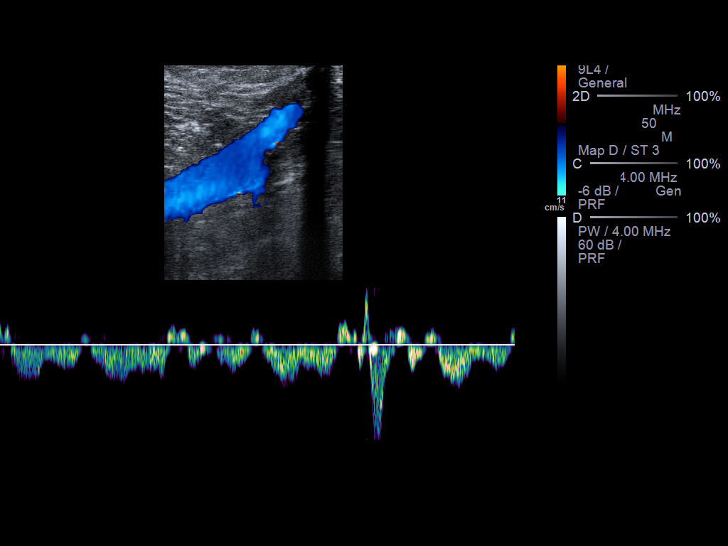
[im 12/40]
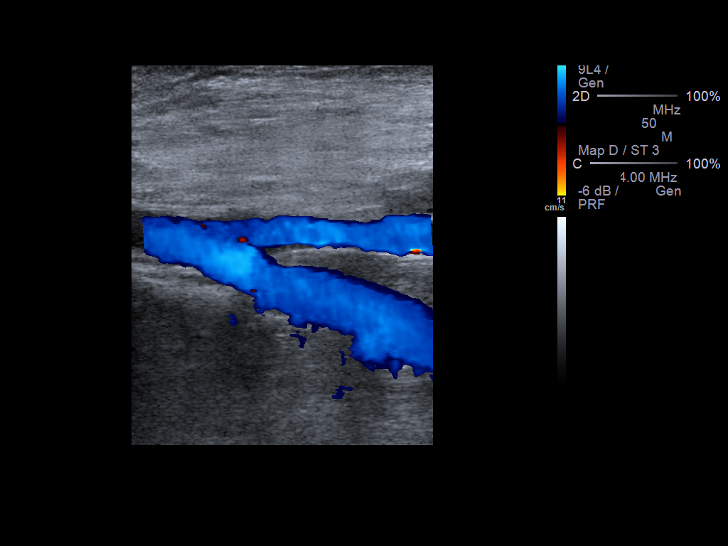
[im 16/40]
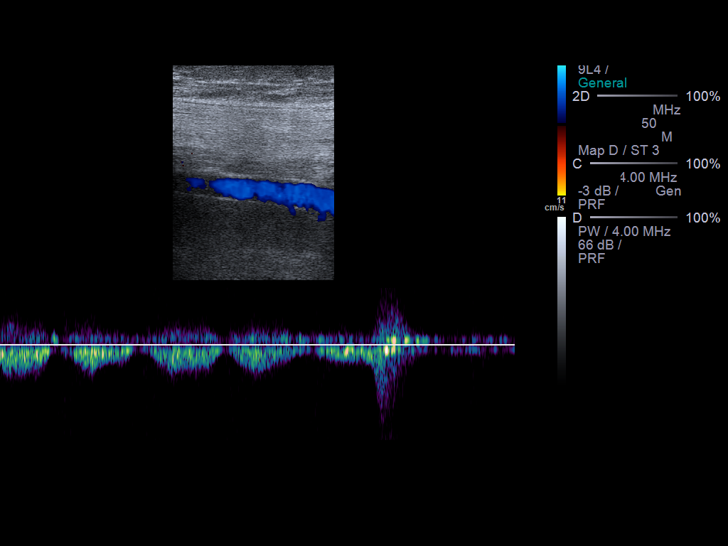
[im 19/40]
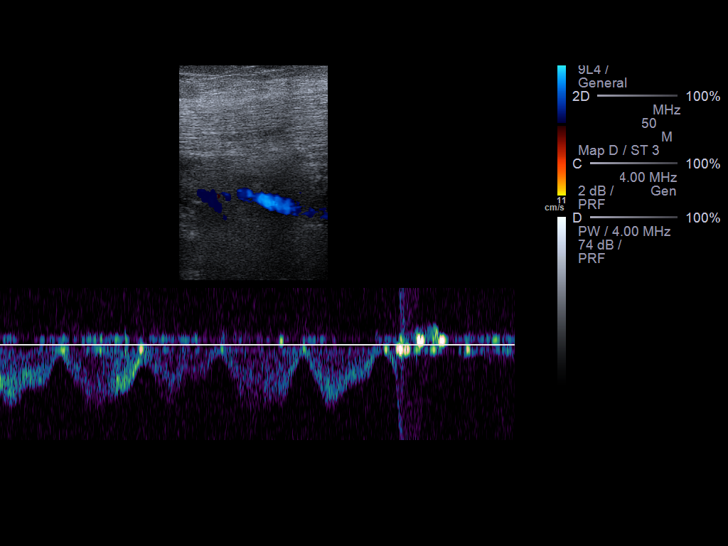
[im 21/40]
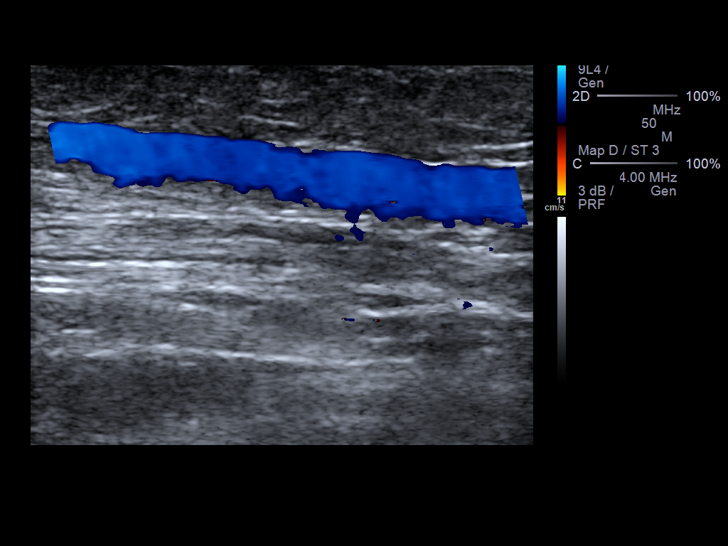
[im 24/40]
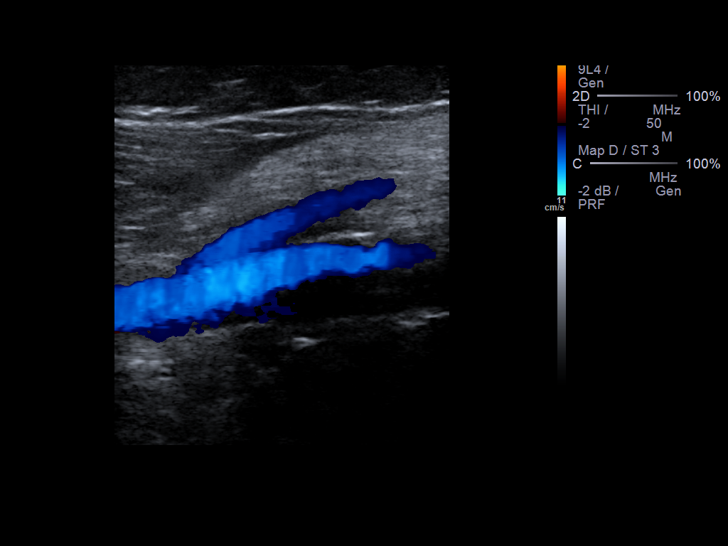
[im 28/40]
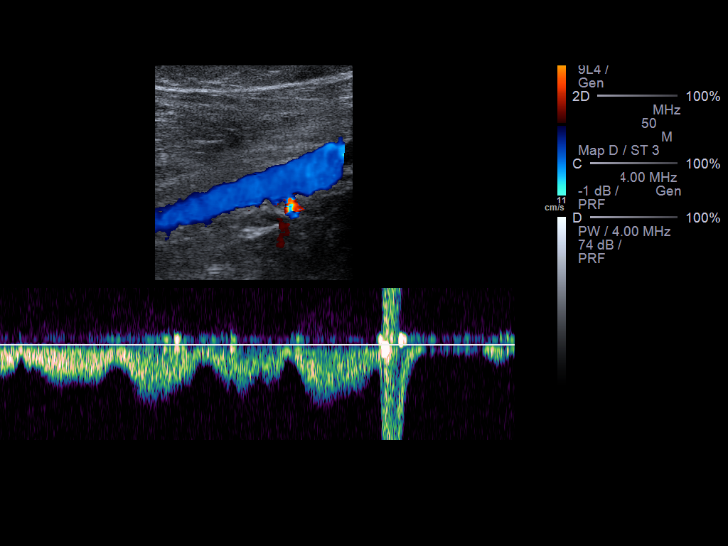
[im 31/40]
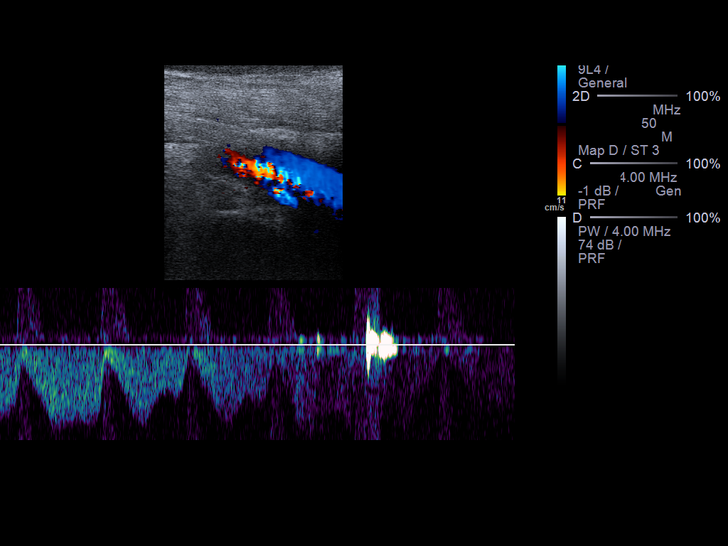
[im 33/40]
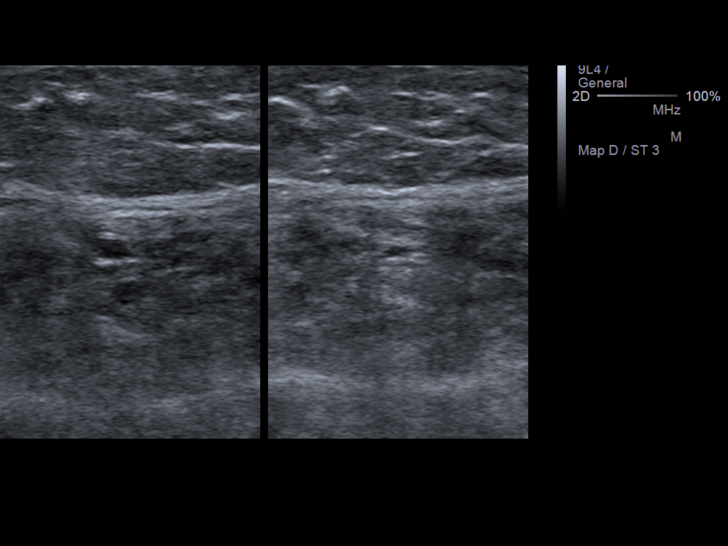
[im 36/40]
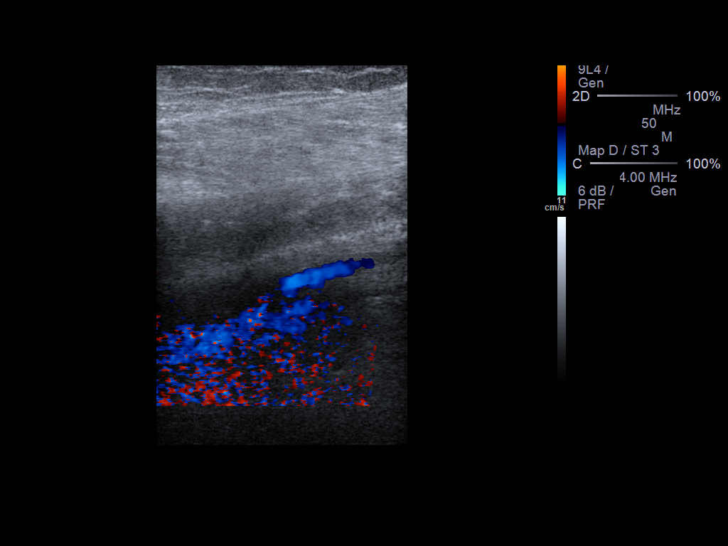
[im 40/40]
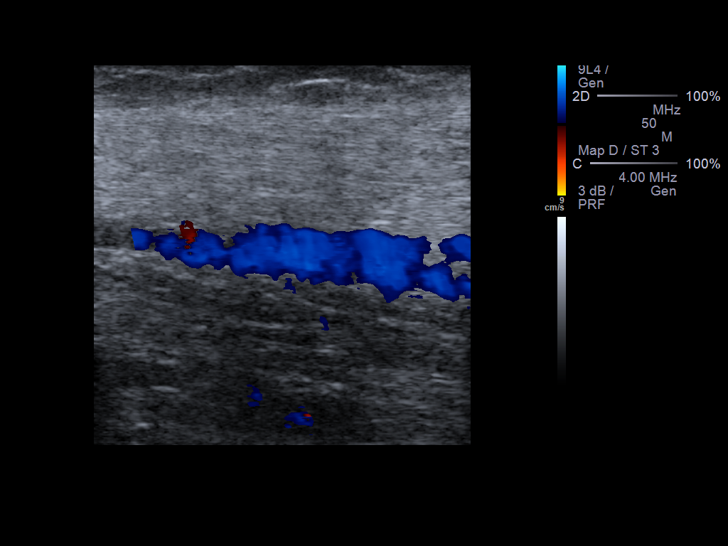

[14 of 24 positions shown; findings below may reference images not displayed]

FINDINGS: Thrombus within deep veins:  None visualized.

Compressibility of deep veins:  Normal.

Duplex waveform respiratory phasicity:  Normal.

Duplex waveform response to augmentation:  Normal.

Venous reflux:  None visualized.

Other findings:  None visualized.
IMPRESSION: No evidence of deep venous thrombosis in the right lower extremity.

## 2014-04-14 ENCOUNTER — Other Ambulatory Visit: Payer: Self-pay | Admitting: *Deleted

## 2014-04-14 MED ORDER — MONTELUKAST SODIUM 10 MG PO TABS: 10.0000 mg | ORAL_TABLET | Freq: Every day | ORAL | Status: DC

## 2014-04-22 ENCOUNTER — Encounter: Payer: Self-pay | Admitting: Internal Medicine

## 2014-04-22 ENCOUNTER — Ambulatory Visit (INDEPENDENT_AMBULATORY_CARE_PROVIDER_SITE_OTHER): Payer: Federal, State, Local not specified - PPO | Admitting: Internal Medicine

## 2014-04-22 VITALS — BP 124/66 | HR 75 | Temp 99.1°F | Resp 16 | Ht 62.5 in | Wt 208.2 lb

## 2014-04-22 DIAGNOSIS — IMO0002 Reserved for concepts with insufficient information to code with codable children: Secondary | ICD-10-CM

## 2014-04-22 DIAGNOSIS — M549 Dorsalgia, unspecified: Secondary | ICD-10-CM

## 2014-04-22 DIAGNOSIS — E039 Hypothyroidism, unspecified: Secondary | ICD-10-CM

## 2014-04-22 DIAGNOSIS — N393 Stress incontinence (female) (male): Secondary | ICD-10-CM

## 2014-04-22 DIAGNOSIS — E669 Obesity, unspecified: Secondary | ICD-10-CM

## 2014-04-22 DIAGNOSIS — I671 Cerebral aneurysm, nonruptured: Secondary | ICD-10-CM

## 2014-04-22 DIAGNOSIS — G4733 Obstructive sleep apnea (adult) (pediatric): Secondary | ICD-10-CM

## 2014-04-22 DIAGNOSIS — D638 Anemia in other chronic diseases classified elsewhere: Secondary | ICD-10-CM

## 2014-04-22 DIAGNOSIS — Z9989 Dependence on other enabling machines and devices: Secondary | ICD-10-CM

## 2014-04-22 DIAGNOSIS — G8929 Other chronic pain: Secondary | ICD-10-CM

## 2014-04-22 MED ORDER — OXYCODONE-ACETAMINOPHEN 10-325 MG PO TABS: 1.0000 | ORAL_TABLET | Freq: Two times a day (BID) | ORAL | Status: DC | PRN

## 2014-04-22 MED ORDER — PHENTERMINE HCL 37.5 MG PO TABS: ORAL_TABLET | ORAL | Status: DC

## 2014-04-22 NOTE — Progress Notes (Signed)
Pre-visit discussion using our clinic review tool. No additional management support is needed unless otherwise documented below in the visit note.  Patient Active Problem List   Diagnosis Date Noted  . Stress incontinence 04/25/2014  . 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 Murmur 12/31/2013  . Bradycardia 12/31/2013  . Nocturnal hypoxia 10/09/2013  . Shortness of breath 09/23/2013  . Thrush, oral 09/09/2013  . History of bacterial pneumonia 09/01/2013  . 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 History of tobacco abuse 09/01/2013  . Other and unspecified hyperlipidemia 03/28/2013  . Preoperative clearance 03/28/2013  . Routine general medical examination at a health care facility 03/27/2013  . OSA on CPAP 03/27/2013  . Cough 03/13/2013  . Insomnia due to anxiety and fear 03/13/2013  . Unspecified constipation 03/13/2013  . Allergic rhinitis 03/13/2013  . Chronic nausea 12/02/2012  . Obesity (BMI 30-39.9) 03/10/2012  . Monoclonal gammopathy of undetermined significance 03/10/2012  . Anemia 01/01/2012  . Unspecified hypothyroidism 01/01/2012  . Osteoarthritis of right knee 07/12/2011  . Brain aneurysm 04/18/2011  . Chronic back pain greater than 3 months duration 04/18/2011  . Breast screening, unspecified 04/18/2011  . Hypertension 04/07/2011    Subjective:  CC:   Chief Complaint  Patient presents with  . Follow-up    6 month    HPI:   [name] is a 73 y.o. female who presents for Follow up on multipel chronic conditions and several new issues,  including chronic low back pain, hypothyroidism,  , OSA on CPAP,  Obesity ,  Dyspareunia and stress incontinence .  She feels generally well and is requesting refills on her chronic medications. She has recovered from her recent pneumonia secondary to legionella, which resulted in VDRF and prolonged critical illness.  Her energy level is back to normal and she has finished  most of the PT sessions that were ordered ,  She is having discomfort during intercourse due to dryness and is frustrated that she has not lost more weight than she has.    Past Medical History  Diagnosis Date  . Degenerative disc disease     HERNITAED DISC BY MRI ON 07/07;S/P LAMINECTOMY,RID REPLACEMENT  . Brain aneurysm     S/P RESECTION  . Discoid lupus erythematosus eyelid     NEGATIVE SEROLOGIC MARKERS  . Hypercholesterolemia   . Chronic sinusitis   . Hypertension   . Dysrhythmia     PVC's occ.  . Pneumonia 05/30/2010    after receiving Propofol  . Headache(784.0)     migraines occ.  . CHF (congestive heart failure) 05/2010    after receiving Propofol  . Hypothyroidism     takes Synthroid  . Blood transfusion 2009    back surgery  . Asthma 06/2010    depending on what around  . Complication of anesthesia 05/30/2010    from colonoscopy-had Propofol-had a  . Complication of anesthesia     reaction-went into asthma-pneumonia,-  . Complication of anesthesia     then CHF and to ICU  . Sleep apnea     uses CPAP-8.4-oxygen suppliment    Past Surgical History  Procedure Laterality Date  . Cosmetic surgery    . Tonsillectomy  1963  . Back surgery  1968,1988,2002,2009    4 laminectomies and fusions  . Abdominal hysterectomy  1988    uterus only  . Brain surgery  2005    coiling of cranial aneurysum  . Excision morton's neuroma  1990    left foot  . Nasal sinus surgery  1977-2008  . Lipomas  1975    3 from right breast  . Tympanic membrane repair  2009-last one    x5 times  . Total knee arthroplasty  07/10/2011    Procedure: TOTAL KNEE ARTHROPLASTY;  Surgeon: Gearlean Alf;  Location: [facility];  Service: Orthopedics;  Laterality: Right;  . Joint replacement  2013    Right knee,  [place]  . Eye surgery      both cataracts -[place] 7/14  . Foot arthrodesis Right 05/22/2013    Procedure: RIGHT HALLUX METATARSAL PHALANGEAL JOINT ARTHRODESIS ;  Surgeon: Wylene Simmer, MD;   Location: [place];  Service: Orthopedics;  Laterality: Right;       The following portions of the patient's history were reviewed and updated as appropriate: Allergies, current medications, and problem list.    Review of Systems:   Patient denies headache, fevers, malaise, unintentional weight loss, skin rash, eye pain, sinus congestion and sinus pain, sore throat, dysphagia,  hemoptysis , cough, dyspnea, wheezing, chest pain, palpitations, orthopnea, edema, abdominal pain, nausea, melena, diarrhea, constipation, flank pain, dysuria, hematuria, urinary  Frequency, nocturia, numbness, tingling, seizures,  Focal weakness, Loss of consciousness,  Tremor, insomnia, depression, anxiety, and suicidal ideation.     History   Social History  . Marital Status: Married    Spouse Name: N/A    Number of Children: N/A  . Years of Education: N/A   Occupational History  . Not on file.   Social History Main Topics  . Smoking status: Former Smoker -- 0.50 packs/day for 40 years    Types: Cigarettes    Quit date: 07/24/2013  . Smokeless tobacco: Never Used     Comment: REMOTELY QUIT  TOBACCO USE  . Alcohol Use: Yes     Comment: occasional  . Drug Use: No  . Sexual Activity: Not Currently   Other Topics Concern  . Not on file   Social History Narrative   Light exercise   LIVES WITH SPOUSE          Objective:  Filed Vitals:   04/22/14 1456  BP: 124/66  Pulse: 75  Temp: 99.1 F (37.3 C)  Resp: 16     General appearance: alert, cooperative and appears stated age Ears: normal TM's and external ear canals both ears Throat: lips, mucosa, and tongue normal; teeth and gums normal Neck: no adenopathy, no carotid bruit, supple, symmetrical, trachea midline and thyroid not enlarged, symmetric, no tenderness/mass/nodules Back: symmetric, no curvature. ROM normal. No CVA tenderness. Lungs: clear to auscultation bilaterally Heart: regular rate and rhythm, S1, S2  normal, no murmur, click, rub or gallop Abdomen: soft, non-tender; bowel sounds normal; no masses,  no organomegaly Pulses: 2+ and symmetric Skin: Skin color, texture, turgor normal. No rashes or lesions Lymph nodes: Cervical, supraclavicular, and axillary nodes normal.  Assessment and Plan:  Brain aneurysm She has not had follow up With [facility] since her coiling due to multiple other health problems that took precedence.   OSA on CPAP Diagnosed by sleep study. She is wearing her CPAP every night a minimum of 6 hours per night and notes improved daytime wakefulness and decreased fatigue   Unspecified hypothyroidism Thyroid function is WNL on current dose.  No current changes needed.  Lab Results  Component Value Date   TSH 0.36 11/05/2013     Chronic back pain greater than 3 months duration Symptoms are stable  on current narcotic regimen.  She is only using two percocet daily. Monthly rx has been reduced to quantity of 60 .    Anemia Stable.  Iron studies are normal/    Lab Results  Component Value Date   WBC 8.6 02/02/2014   HGB 12.1 02/02/2014   HCT 38 02/02/2014   MCV 95.1 11/28/2013   PLT 158 02/02/2014   Lab Results  Component Value Date   FERRITIN 185.4 10/10/2013   Lab Results  Component Value Date   IRON 96 10/10/2013   TIBC 306 10/10/2013   FERRITIN 185.4 10/10/2013       Obesity (BMI 30-39.9) She has lost 6 lbs since her last visit.  She has had difficulty losing weight due to increased appetite and is requesting a trial of  Phentermine in conjunction with exercise and a low glyceminc index diet. .  She is aware of the possible side effects and risks and understands that    The medication will be discontinued if she has not lost 5% of her body weight over the next 3 months, which , based on today's weight is 10 lbs.  Stress incontinence Kegel exercises discussed today.   Dyspareunia secondary to postmenopausal changes resulting in dryness.  Non hormonal  lubricants discussed. .   A total of 40 minutes was spent with patient more than half of which was spent in counseling patient on the above mentioned issues , reviewing and explaining recent labs and imaging studies done, and coordination of care.   Updated Medication List Outpatient Encounter Prescriptions as of 04/22/2014  Medication Sig  . amLODipine (NORVASC) 10 MG tablet Take 1 tablet (10 mg total) by mouth daily.  . Ascorbic Acid (VITAMIN C) 1000 MG tablet Take 500 mg by mouth daily.   . benzonatate (TESSALON) 100 MG capsule Take 1 capsule (100 mg total) by mouth 2 (two) times daily.  . cyanocobalamin (,VITAMIN B-12,) 1000 MCG/ML injection Inject 1 mL (1,000 mcg total) into the muscle every 30 (thirty) days.  Marland Kitchen desloratadine (CLARINEX) 5 MG tablet Take 5 mg by mouth daily.  . diazepam (VALIUM) 5 MG tablet Take 5 mg by mouth every 12 (twelve) hours as needed for muscle spasms. TAKE 1 TABLET TWICE DAILY  . doxepin (SINEQUAN) 25 MG capsule Take 2 capsules (50 mg total) by mouth at bedtime.  . FeFum-FePo-FA-B Cmp-C-Zn-Mn-Cu (SE-TAN PLUS) 162-115.2-1 MG CAPS TAKE 1 CAPSULE BY MOUTH DAILY.  . furosemide (LASIX) 20 MG tablet Take 20-40 mg by mouth daily as needed for fluid.  . isosorbide mononitrate (IMDUR) 60 MG 24 hr tablet Take 1 tablet (60 mg total) by mouth daily.  Marland Kitchen LEXAPRO 10 MG tablet TAKE 1 TABLET (10 MG TOTAL) BY MOUTH DAILY.  Marland Kitchen lidocaine (LIDODERM) 5 % Place 3 patches onto the skin daily. PLACE 3 PATCHES ONTO THE SKIN DAILY. REMOVE & DISCARD PATCH WITHIN 12 HOURS OR AS DIRECTED BY MD  . losartan (COZAAR) 50 MG tablet Take 1 tablet (50 mg total) by mouth 2 (two) times daily.  . montelukast (SINGULAIR) 10 MG tablet Take 1 tablet (10 mg total) by mouth at bedtime. TAKE 1 TABLET (10 MG TOTAL) BY MOUTH AT BEDTIME.  Marland Kitchen NEEDLE, DISP, 23 G (BD DISP NEEDLE) 23G X 1" MISC Uses to inject b12 into muscle once monthly  . omega-3 acid ethyl esters (LOVAZA) 1 G capsule Take 2 g by mouth 2 (two) times  daily. TAKE 2 CAPSULES TWICE A DAY  . OVER THE COUNTER MEDICATION Take  1 capsule by mouth daily. Florify  . oxyCODONE-acetaminophen (PERCOCET) 10-325 MG per tablet Take 1 tablet by mouth 2 (two) times daily as needed for pain.  . rosuvastatin (CRESTOR) 20 MG tablet Take 1 tablet (20 mg total) by mouth daily.  . sodium chloride (OCEAN) 0.65 % nasal spray Place 2 sprays into the nose as needed. Allergies    . spironolactone (ALDACTONE) 25 MG tablet Take 1 tablet (25 mg total) by mouth daily.  Marland Kitchen SYNTHROID 150 MCG tablet TAKE 1 TABLET (150 MCG TOTAL) BY MOUTH DAILY.  Marland Kitchen zolpidem (AMBIEN) 5 MG tablet TAKE 1 TABLET BY MOUTH AT BEDTIME prn  . [DISCONTINUED] levothyroxine (SYNTHROID) 150 MCG tablet Take 150 mcg by mouth daily before breakfast. TAKE 1 TABLET (150 MCG TOTAL) BY MOUTH DAILY.  . [DISCONTINUED] oxyCODONE-acetaminophen (PERCOCET) 10-325 MG per tablet Take 1 tablet by mouth every 6 (six) hours as needed for pain.  . [DISCONTINUED] oxyCODONE-acetaminophen (PERCOCET) 10-325 MG per tablet Take 1 tablet by mouth 2 (two) times daily as needed for pain.  . [DISCONTINUED] oxyCODONE-acetaminophen (PERCOCET) 10-325 MG per tablet Take 1 tablet by mouth 2 (two) times daily as needed for pain.  . phentermine (ADIPEX-P) 37.5 MG tablet 1/2 tablet once or twice daily for appetite suppression  . [DISCONTINUED] doxepin (SINEQUAN) 25 MG capsule TAKE 2 CAPSULES (50 MG TOTAL) BY MOUTH AT BEDTIME.  . [DISCONTINUED] nebivolol (BYSTOLIC) 5 MG tablet Take 1 tablet (5 mg total) by mouth daily.  . [DISCONTINUED] SPIRIVA HANDIHALER 18 MCG inhalation capsule USE ONCE DAILY     No orders of the defined types were placed in this encounter.    Return in about 3 months (around 07/22/2014).

## 2014-04-22 NOTE — Patient Instructions (Addendum)
[place] Whole Wheat low carb tortillas 26  g fiber  ,  6 net carbs!!!and BJs)   Stress incontinence is not treatable with medications, only with surgery or muscle tightening (Kegel exercise)  I have authorized the use of phentermine for 3 months.  Please have your vital signs checked a week after starting, and return to see me in 3 months  Take 1/2 tablet in the morning,  The second half by 2 PM to avoid insomnia If you have not lost 10 lbs , or 5% of your starting weight by the end of the  3 months we will have to discontinue it.   [place] vaginal suppository or Astroglide for discomfort during intercourse   Phentermine tablets or capsules What is this medicine? PHENTERMINE (FEN ter meen) decreases your appetite. It is used with a reduced calorie diet and exercise to help you lose weight. This medicine may be used for other purposes; ask your health care provider or pharmacist if you have questions. COMMON BRAND NAME(S): Adipex-P, Atti-Plex P, Atti-Plex P Spansule, Fastin, Pro-Fast, Tara-8 What should I tell my health care provider before I take this medicine? They need to know if you have any of these conditions: -agitation -glaucoma -heart disease -high blood pressure -history of substance abuse -lung disease called Primary Pulmonary Hypertension (PPH) -taken an MAOI like Carbex, Eldepryl, Marplan, Nardil, or Parnate in last 14 days -thyroid disease -an unusual or allergic reaction to phentermine, other medicines, foods, dyes, or preservatives -pregnant or trying to get pregnant -breast-feeding How should I use this medicine? Take this medicine by mouth with a glass of water. Follow the directions on the prescription label. This medicine is usually taken 30 minutes before or 1 to 2 hours after breakfast. Avoid taking this medicine in the evening. It may interfere with sleep. Take your doses at regular intervals. Do not take your medicine more often than directed. Talk to your  pediatrician regarding the use of this medicine in children. Special care may be needed. Overdosage: If you think you have taken too much of this medicine contact a poison control center or emergency room at once. NOTE: This medicine is only for you. Do not share this medicine with others. What if I miss a dose? If you miss a dose, take it as soon as you can. If it is almost time for your next dose, take only that dose. Do not take double or extra doses. What may interact with this medicine? Do not take this medicine with any of the following medications: -duloxetine -MAOIs like Carbex, Eldepryl, Marplan, Nardil, and Parnate -medicines for colds or breathing difficulties like pseudoephedrine or phenylephrine -procarbazine -sibutramine -SSRIs like citalopram, escitalopram, fluoxetine, fluvoxamine, paroxetine, and sertraline -stimulants like dexmethylphenidate, methylphenidate or modafinil -venlafaxine This medicine may also interact with the following medications: -medicines for diabetes This list may not describe all possible interactions. Give your health care provider a list of all the medicines, herbs, non-prescription drugs, or dietary supplements you use. Also tell them if you smoke, drink alcohol, or use illegal drugs. Some items may interact with your medicine. What should I watch for while using this medicine? Notify your physician immediately if you become short of breath while doing your normal activities. Do not take this medicine within 6 hours of bedtime. It can keep you from getting to sleep. Avoid drinks that contain caffeine and try to stick to a regular bedtime every night. This medicine was intended to be used in addition to a healthy diet  and exercise. The best results are achieved this way. This medicine is only indicated for short-term use. Eventually your weight loss may level out. At that point, the drug will only help you maintain your new weight. Do not increase or in  any way change your dose without consulting your doctor. You may get drowsy or dizzy. Do not drive, use machinery, or do anything that needs mental alertness until you know how this medicine affects you. Do not stand or sit up quickly, especially if you are an older patient. This reduces the risk of dizzy or fainting spells. Alcohol may increase dizziness and drowsiness. Avoid alcoholic drinks. What side effects may I notice from receiving this medicine? Side effects that you should report to your doctor or health care professional as soon as possible: -chest pain, palpitations -depression or severe changes in mood -increased blood pressure -irritability -nervousness or restlessness -severe dizziness -shortness of breath -problems urinating -unusual swelling of the legs -vomiting Side effects that usually do not require medical attention (report to your doctor or health care professional if they continue or are bothersome): -blurred vision or other eye problems -changes in sexual ability or desire -constipation or diarrhea -difficulty sleeping -dry mouth or unpleasant taste -headache -nausea This list may not describe all possible side effects. Call your doctor for medical advice about side effects. You may report side effects to FDA at 1-800-FDA-1088. Where should I keep my medicine? Keep out of the reach of children. This medicine can be abused. Keep your medicine in a safe place to protect it from theft. Do not share this medicine with anyone. Selling or giving away this medicine is dangerous and against the law. Store at room temperature between 20 and 25 degrees C (68 and 77 degrees F). Keep container tightly closed. Throw away any unused medicine after the expiration date. NOTE: This sheet is a summary. It may not cover all possible information. If you have questions about this medicine, talk to your doctor, pharmacist, or health care provider.  2015, Elsevier/Gold Standard.  (2010-09-21 11:02:44)

## 2014-04-25 ENCOUNTER — Encounter: Payer: Self-pay | Admitting: Internal Medicine

## 2014-04-25 ENCOUNTER — Other Ambulatory Visit: Payer: Self-pay | Admitting: Internal Medicine

## 2014-04-25 DIAGNOSIS — IMO0002 Reserved for concepts with insufficient information to code with codable children: Secondary | ICD-10-CM | POA: Insufficient documentation

## 2014-04-25 DIAGNOSIS — N393 Stress incontinence (female) (male): Secondary | ICD-10-CM | POA: Insufficient documentation

## 2014-04-25 NOTE — Assessment & Plan Note (Addendum)
Symptoms are stable on current narcotic regimen.  She is only using two percocet daily. Monthly rx has been reduced to quantity of 60 .

## 2014-04-25 NOTE — Assessment & Plan Note (Signed)
Stable.  Iron studies are normal/    Lab Results  Component Value Date   WBC 8.6 02/02/2014   HGB 12.1 02/02/2014   HCT 38 02/02/2014   MCV 95.1 11/28/2013   PLT 158 02/02/2014   Lab Results  Component Value Date   FERRITIN 185.4 10/10/2013   Lab Results  Component Value Date   IRON 96 10/10/2013   TIBC 306 10/10/2013   FERRITIN 185.4 10/10/2013

## 2014-04-25 NOTE — Assessment & Plan Note (Signed)
Thyroid function is WNL on current dose.  No current changes needed.  Lab Results  Component Value Date   TSH 0.36 11/05/2013

## 2014-04-25 NOTE — Assessment & Plan Note (Signed)
Kegel exercises discussed today.

## 2014-04-25 NOTE — Assessment & Plan Note (Signed)
She has not had follow up With [facility] since her coiling due to multiple other health problems that took precedence.

## 2014-04-25 NOTE — Assessment & Plan Note (Addendum)
She has lost 6 lbs since her last visit.  She has had difficulty losing weight due to increased appetite and is requesting a trial of  Phentermine in conjunction with exercise and a low glyceminc index diet. .  She is aware of the possible side effects and risks and understands that    The medication will be discontinued if she has not lost 5% of her body weight over the next 3 months, which , based on today's weight is 10 lbs.

## 2014-04-25 NOTE — Assessment & Plan Note (Signed)
Diagnosed by sleep study. She is wearing her CPAP every night a minimum of 6 hours per night and notes improved daytime wakefulness and decreased fatigue  

## 2014-04-25 NOTE — Assessment & Plan Note (Signed)
secondary to postmenopausal changes resulting in dryness.  Non hormonal lubricants discussed. Marland Kitchen

## 2014-04-28 NOTE — Telephone Encounter (Signed)
Last refill 5.22.15, last OV 9.2.15, next OV 12.9.15.  Please advise refill

## 2014-04-29 NOTE — Telephone Encounter (Signed)
Ok to refill,  printed rx  

## 2014-04-29 NOTE — Telephone Encounter (Signed)
Rx faxed to pharmacy  

## 2014-05-01 ENCOUNTER — Other Ambulatory Visit: Payer: Self-pay | Admitting: Internal Medicine

## 2014-05-11 IMAGING — CR DG CHEST 2V
1 series · 2 of 2 positions shown · non-contrast
Comparison: 07/31/2013, 07/29/2013

CLINICAL DATA: History of pneumonitis.

EXAM:
CHEST  2 VIEW

[Series 1: w chest pa · 0.14mm/px · 2 of 2 slices shown]
[im 1/2]
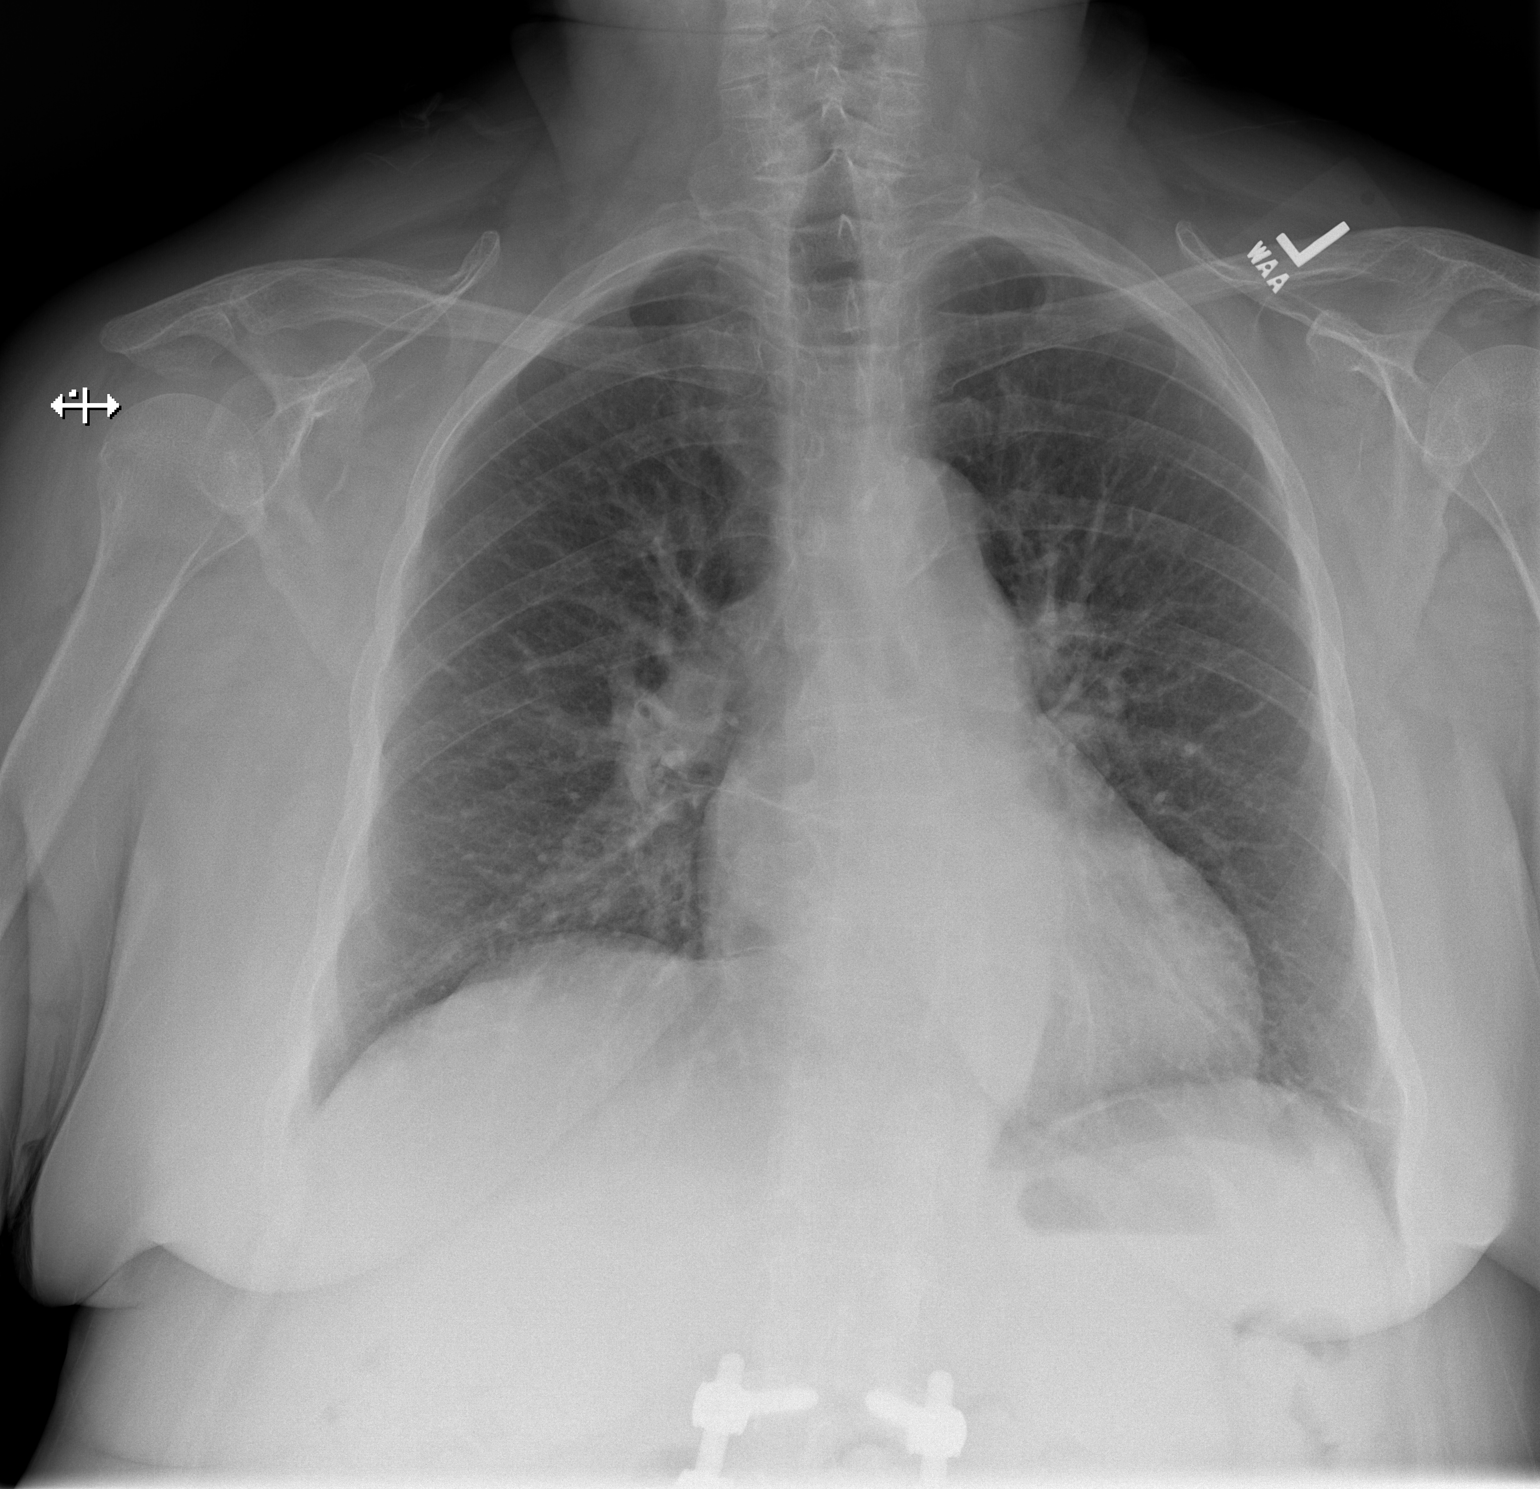
[im 2/2]
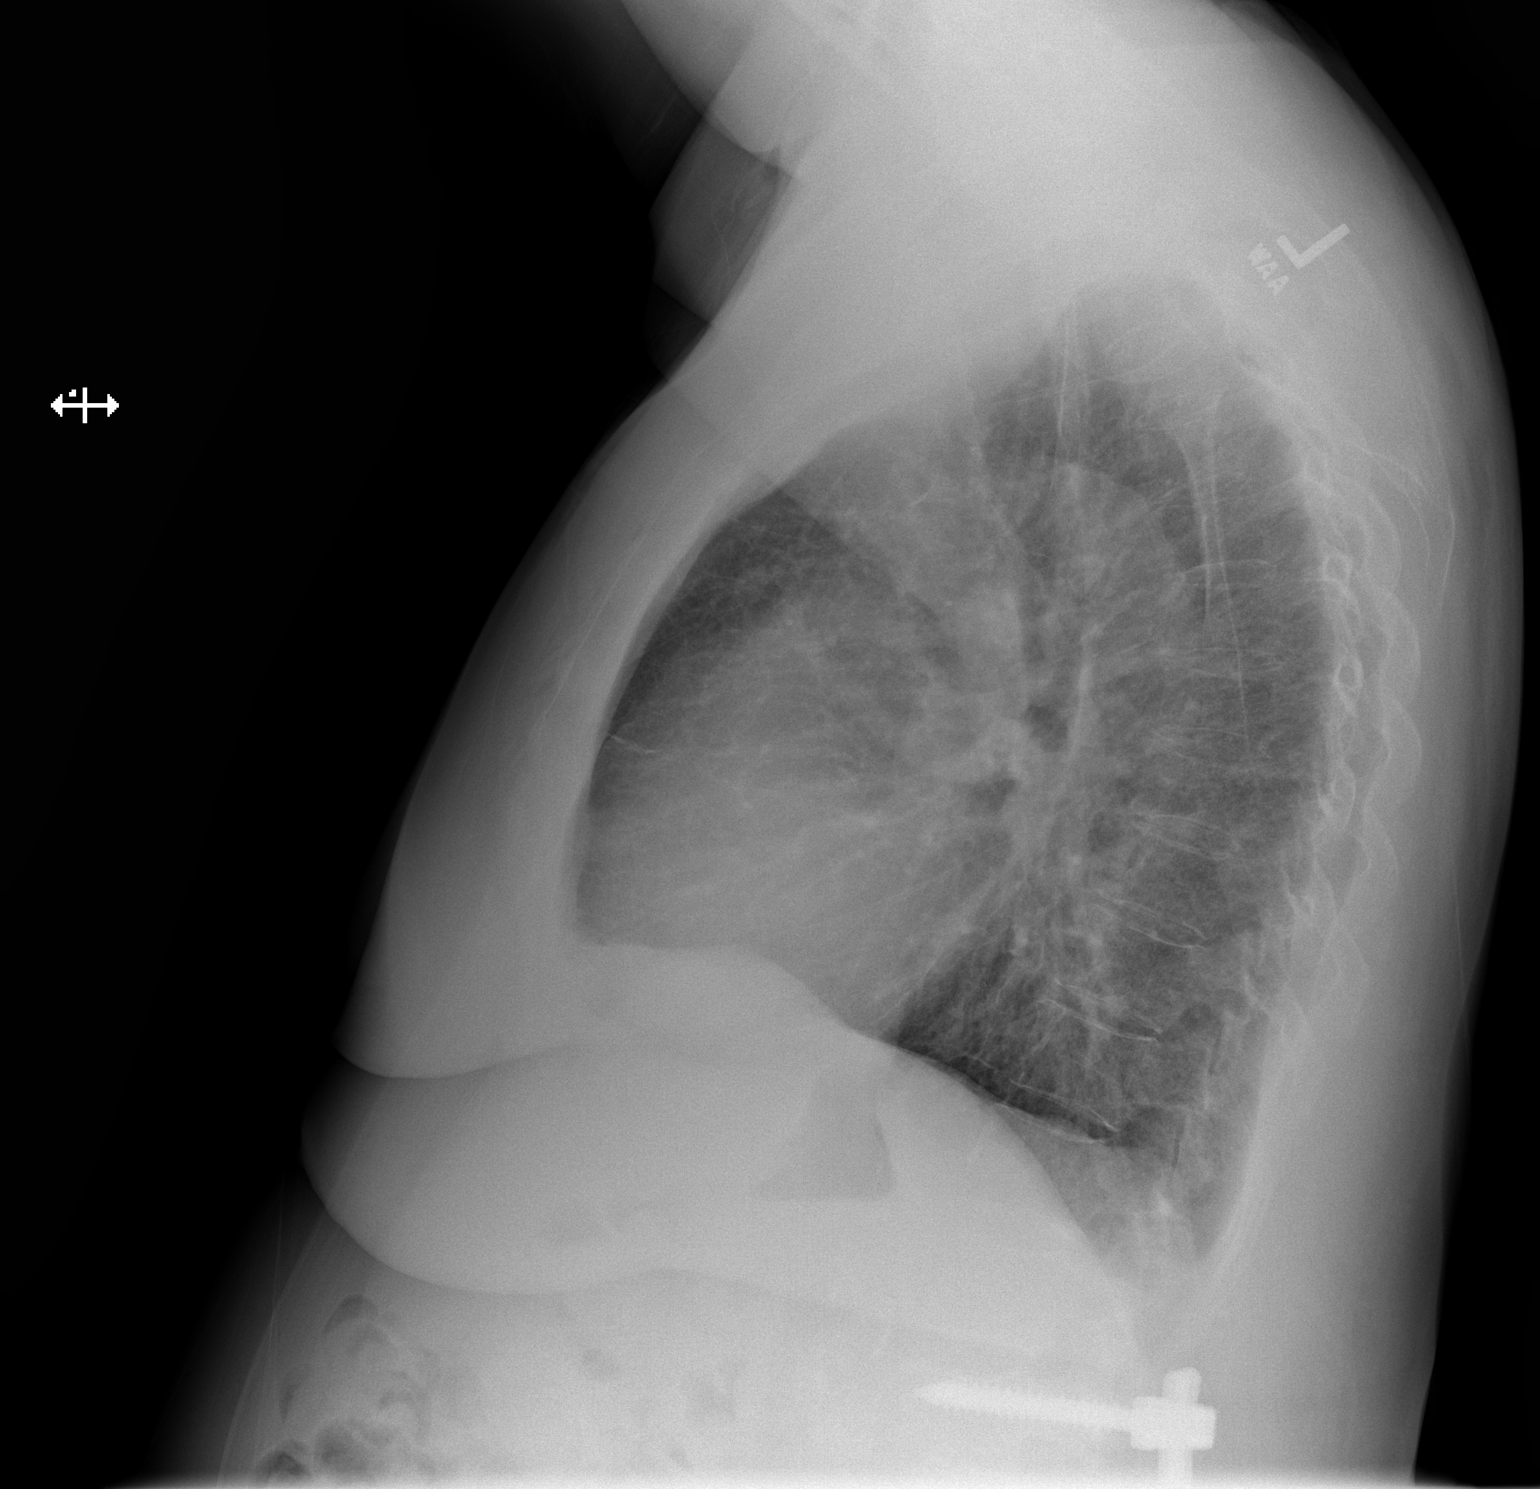

[2 of 2 positions shown; findings below may reference images not displayed]

FINDINGS: Low lung volumes. Cardiac silhouette is mild-to-moderately enlarged.
Minimal discoid atelectasis projects in the left costophrenic angle.
Osseous structures unremarkable. Lungs otherwise clear.
IMPRESSION: No evidence of acute cardiopulmonary disease.

## 2014-07-03 ENCOUNTER — Other Ambulatory Visit: Payer: Self-pay | Admitting: Internal Medicine

## 2014-07-04 ENCOUNTER — Other Ambulatory Visit: Payer: Self-pay | Admitting: Internal Medicine

## 2014-07-06 NOTE — Telephone Encounter (Signed)
Lab appt 07/27/14, needs lipids

## 2014-07-15 ENCOUNTER — Other Ambulatory Visit: Payer: Self-pay | Admitting: *Deleted

## 2014-07-15 MED ORDER — OXYCODONE-ACETAMINOPHEN 10-325 MG PO TABS: 1.0000 | ORAL_TABLET | Freq: Two times a day (BID) | ORAL | Status: DC | PRN

## 2014-07-15 NOTE — Progress Notes (Signed)
Patient came into office for refill on percocet, advised MD of request given verbal to print md sigend placed at front for patient pick up with message patient must keep appointment.

## 2014-07-23 DIAGNOSIS — H7013 Chronic mastoiditis, bilateral: Secondary | ICD-10-CM | POA: Insufficient documentation

## 2014-07-27 ENCOUNTER — Other Ambulatory Visit: Payer: Medicare Other

## 2014-07-29 ENCOUNTER — Other Ambulatory Visit: Payer: Medicare Other

## 2014-07-29 ENCOUNTER — Ambulatory Visit: Payer: Medicare Other | Admitting: Internal Medicine

## 2014-07-30 ENCOUNTER — Encounter (HOSPITAL_COMMUNITY): Payer: Self-pay | Admitting: Internal Medicine

## 2014-07-30 ENCOUNTER — Telehealth: Payer: Self-pay | Admitting: *Deleted

## 2014-07-30 MED ORDER — CYANOCOBALAMIN 1000 MCG/ML IJ SOLN: INTRAMUSCULAR | Status: DC

## 2014-07-30 NOTE — Telephone Encounter (Signed)
Refill request for B12 10.0 ml not on current med list. Ok to refill?  Currently taking.

## 2014-07-30 NOTE — Telephone Encounter (Signed)
Ok to refill, 

## 2014-08-03 ENCOUNTER — Telehealth: Payer: Self-pay | Admitting: Internal Medicine

## 2014-08-03 ENCOUNTER — Other Ambulatory Visit: Payer: Self-pay | Admitting: Internal Medicine

## 2014-08-03 NOTE — Telephone Encounter (Signed)
The patient is asking for oxyCODONE-acetaminophen (PERCOCET) 10-325 MG per tablet to be increased to 3 times a day or every 6 hrs.

## 2014-08-03 NOTE — Telephone Encounter (Signed)
Spoke with pt, advised she needs appoint.

## 2014-08-03 NOTE — Telephone Encounter (Signed)
This cannot be done without an office visit

## 2014-08-05 ENCOUNTER — Other Ambulatory Visit: Payer: Self-pay | Admitting: Internal Medicine

## 2014-08-10 ENCOUNTER — Other Ambulatory Visit: Payer: Self-pay | Admitting: *Deleted

## 2014-08-10 ENCOUNTER — Encounter: Payer: Self-pay | Admitting: Internal Medicine

## 2014-08-10 ENCOUNTER — Telehealth: Payer: Self-pay | Admitting: *Deleted

## 2014-08-10 ENCOUNTER — Ambulatory Visit (INDEPENDENT_AMBULATORY_CARE_PROVIDER_SITE_OTHER): Payer: Federal, State, Local not specified - PPO | Admitting: Internal Medicine

## 2014-08-10 VITALS — BP 132/72 | HR 78 | Temp 99.5°F | Resp 14 | Wt 207.5 lb

## 2014-08-10 DIAGNOSIS — M1711 Unilateral primary osteoarthritis, right knee: Secondary | ICD-10-CM

## 2014-08-10 DIAGNOSIS — D472 Monoclonal gammopathy: Secondary | ICD-10-CM

## 2014-08-10 DIAGNOSIS — E559 Vitamin D deficiency, unspecified: Secondary | ICD-10-CM

## 2014-08-10 DIAGNOSIS — E669 Obesity, unspecified: Secondary | ICD-10-CM

## 2014-08-10 DIAGNOSIS — E034 Atrophy of thyroid (acquired): Secondary | ICD-10-CM

## 2014-08-10 DIAGNOSIS — I10 Essential (primary) hypertension: Secondary | ICD-10-CM

## 2014-08-10 DIAGNOSIS — E038 Other specified hypothyroidism: Secondary | ICD-10-CM

## 2014-08-10 DIAGNOSIS — F5105 Insomnia due to other mental disorder: Secondary | ICD-10-CM

## 2014-08-10 DIAGNOSIS — F409 Phobic anxiety disorder, unspecified: Secondary | ICD-10-CM

## 2014-08-10 MED ORDER — OMEGA-3-ACID ETHYL ESTERS 1 G PO CAPS: 2.0000 g | ORAL_CAPSULE | Freq: Two times a day (BID) | ORAL | Status: DC

## 2014-08-10 MED ORDER — OXYCODONE-ACETAMINOPHEN 10-325 MG PO TABS: 1.0000 | ORAL_TABLET | Freq: Three times a day (TID) | ORAL | Status: DC | PRN

## 2014-08-10 MED ORDER — LOSARTAN POTASSIUM 100 MG PO TABS: 100.0000 mg | ORAL_TABLET | Freq: Every day | ORAL | Status: DC

## 2014-08-10 NOTE — Telephone Encounter (Signed)
Pt requested 90 day supply.

## 2014-08-10 NOTE — Patient Instructions (Addendum)
Changing the losartan to 100 mg daily at bedtime  Continue amlodipine daily in the morning  Please suspend the phentermine  until you are serious about your diet and your exercise regimen   You can try turmeric 1000 mg daily for anti inflammatory   Please return for fasting labs in early January   This is  my version of a  "Low GI"  Diet:  It will still lower your blood sugars and allow you to lose 4 to 8  lbs  per month if you follow it carefully.  Your goal with exercise is a minimum of 30 minutes of aerobic exercise 5 days per week (Walking does not count once it becomes easy!)    All of the foods can be found at grocery stores and in bulk at [employer].  The Atkins protein bars and shakes are available in more varieties at [employer], [employer] and [place].     7 AM Breakfast:  Choose from the following:  Low carbohydrate Protein  Shakes (I recommend the [employer]AS AdvantEdge "Carb Control" shakes  Or the low carb shakes by Atkins.    2.5 carbs   Arnold's "Sandwhich Thin"toasted  w/ peanut butter (no jelly: about 20 net carbs  "Bagel Thin" with cream cheese and salmon: about 20 carbs   a scrambled egg/bacon/cheese burrito made with Mission's "carb balance" whole wheat tortilla  (about 10 net carbs )   Avoid cereal and bananas, oatmeal and cream of wheat and grits. They are loaded with carbohydrates!   10 AM: high protein snack  Protein bar by Atkins (the snack size, under 200 cal, usually < 6 net carbs).    A stick of cheese:  Around 1 carb,  100 cal     Dannon Light n Fit [place] Yogurt  (80 cal, 8 carbs)  Other so called "protein bars" and Greek yogurts tend to be loaded with carbohydrates.  Remember, in food advertising, the word "energy" is synonymous for " carbohydrate."  Lunch:   A Sandwich using the bread choices listed, Can use any  Eggs,  lunchmeat, grilled meat or canned tuna), avocado, regular mayo/mustard  and cheese.  A Salad using blue cheese, ranch,  Goddess or vinagrette,  No  croutons or "confetti" and no "candied nuts" but regular nuts OK.   No pretzels or chips.  Pickles and miniature sweet peppers are a good low carb alternative that provide a "crunch"  The bread is the only source of carbohydrate in a sandwich and  can be decreased by trying some of these alternatives to traditional loaf bread  Joseph's makes a pita bread and a flat bread that are 50 cal and 4 net carbs available at [place].  This can be toasted to use with hummous as well  Toufayan makes a low carb flatbread that's 100 cal and 9 net carbs available at [employer] and [employer]'s makes 2 sizes of  Low carb whole wheat tortilla  (The large one is 210 cal and 6 net carbs) Avoid "Low fat dressings, as well as Barry Brunner and [place] dressings They are loaded with sugar!   3 PM/ Mid day  Snack:  Consider  1 ounce of  almonds, walnuts, pistachios, pecans, peanuts,  Macadamia nuts or a nut medley.  Avoid "granola"; the dried cranberries and raisins are loaded with carbohydrates. Mixed nuts as long as there are no raisins,  cranberries or dried fruit.    Try the prosciutto/mozzarella cheese sticks  by F[employer]ci  In Forensic psychologist /backery section   High protein      6 PM  Dinner:     Meat/fowl/fish with a green salad, and either broccoli, cauliflower, green beans, spinach, brussel sprouts or  Lima beans. DO NOT BREAD THE PROTEIN!!      There is a low carb pasta by Dreamfield's that is acceptable and tastes great: only 5 digestible carbs/serving.( All grocery stores but BJs carry it )  Try Hurley Cisco Angelo's chicken piccata or chicken or eggplant parm over low carb pasta.(Lowes and BJs)   Marjory Lies Sanchez's "Carnitas" (pulled pork, no sauce,  0 carbs) or his beef pot roast to make a dinner burrito (at [employer]J's)  Pesto over low carb pasta (bj's sells a good quality pesto in the center refrigerated section of the deli   Try satueeing  Cheral Marker with mushroooms  Whole wheat pasta is still full of digestible carbs  and  Not as low in glycemic index as Dreamfield's.   Brown rice is still rice,  So skip the rice and noodles if you eat [place] or [place] (or at least limit to 1/2 cup)  9 PM snack :   Breyer's "low carb" fudgsicle or  ice cream bar (Carb Smart line), or  Weight Watcher's ice cream bar , or another "no sugar added" ice cream;  a serving of fresh berries/cherries with whipped cream   Cheese or DANNON'S LlGHT N FIT GREEK YOGURT  8 ounces of [place] unsweetened almond/cococunut milk    Avoid bananas, pineapple, grapes  and watermelon on a regular basis because they are high in sugar.  THINK OF THEM AS DESSERT  Remember that snack Substitutions should be less than 10 NET carbs per serving and meals < 20 carbs. Remember to subtract fiber grams to get the "net carbs."

## 2014-08-10 NOTE — Progress Notes (Signed)
Pre visit review using our clinic review tool, if applicable. No additional management support is needed unless otherwise documented below in the visit note. 

## 2014-08-10 NOTE — Progress Notes (Signed)
Patient ID: [name], female   DOB: 1941/03/26, 73 y.o.   MRN: [id number]   Patient Active Problem List   Diagnosis Date Noted  . Stress incontinence 04/25/2014  . 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 Murmur 12/31/2013  . Bradycardia 12/31/2013  . Nocturnal hypoxia 10/09/2013  . Shortness of breath 09/23/2013  . Thrush, oral 09/09/2013  . History of bacterial pneumonia 09/01/2013  . 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 History of tobacco abuse 09/01/2013  . Other and unspecified hyperlipidemia 03/28/2013  . Preoperative clearance 03/28/2013  . Routine general medical examination at a health care facility 03/27/2013  . OSA on CPAP 03/27/2013  . Cough 03/13/2013  . Insomnia due to anxiety and fear 03/13/2013  . Unspecified constipation 03/13/2013  . Allergic rhinitis 03/13/2013  . Chronic nausea 12/02/2012  . Obesity (BMI 30-39.9) 03/10/2012  . Monoclonal gammopathy of undetermined significance 03/10/2012  . Anemia 01/01/2012  . Unspecified hypothyroidism 01/01/2012  . Osteoarthritis of right knee 07/12/2011  . Brain aneurysm 04/18/2011  . Chronic back pain greater than 3 months duration 04/18/2011  . Breast screening, unspecified 04/18/2011  . Hypertension 04/07/2011    Subjective:  CC:   Chief Complaint  Patient presents with  . Medication Problem    percocet back to previous dose 3 times day     HPI:   [name] is a 73 y.o. female who presents for  Follow up on chronic conditions including chronic back and knee pain ,  obesity and hypertension.  During a recent walk through her [place]'s [place] she twisted her back and her knee and has been using her percocet more frequently.  She has  requested an increase in her percocet use to 3 tablets daily .  She denies swelling of knee and radicular pain  Her pain is aggravated by housework.    Obesity:  She has been taking phentermine since september, and has  only lost 7 lbs  (goal was 10 lbs) .  She cites the holidays as the cause of her inadequate weight loss and intermittent use of the medication.    Past Medical History  Diagnosis Date  . Degenerative disc disease     HERNITAED DISC BY MRI ON 07/07;S/P LAMINECTOMY,RID REPLACEMENT  . Brain aneurysm     S/P RESECTION  . Discoid lupus erythematosus eyelid     NEGATIVE SEROLOGIC MARKERS  . Hypercholesterolemia   . Chronic sinusitis   . Hypertension   . Dysrhythmia     PVC's occ.  . Pneumonia 05/30/2010    after receiving Propofol  . Headache(784.0)     migraines occ.  . CHF (congestive heart failure) 05/2010    after receiving Propofol  . Hypothyroidism     takes Synthroid  . Blood transfusion 2009    back surgery  . Asthma 06/2010    depending on what around  . Complication of anesthesia 05/30/2010    from colonoscopy-had Propofol-had a  . Complication of anesthesia     reaction-went into asthma-pneumonia,-  . Complication of anesthesia     then CHF and to ICU  . Sleep apnea     uses CPAP-8.4-oxygen suppliment    Past Surgical History  Procedure Laterality Date  . Cosmetic surgery    . Tonsillectomy  1963  . Back surgery  1968,1988,2002,2009    4 laminectomies and fusions  . Abdominal hysterectomy  1988    uterus only  . Brain surgery  2005    coiling of cranial aneurysum  . Excision morton's neuroma  1990    left foot  . Nasal sinus surgery  1977-2008  . Lipomas  1975    3 from right breast  . Tympanic membrane repair  2009-last one    x5 times  . Total knee arthroplasty  07/10/2011    Procedure: TOTAL KNEE ARTHROPLASTY;  Surgeon: Gearlean Alf;  Location: [facility];  Service: Orthopedics;  Laterality: Right;  . Joint replacement  2013    Right knee,  [place]  . Eye surgery      both cataracts -[place] 7/14  . Foot arthrodesis Right 05/22/2013    Procedure: RIGHT HALLUX METATARSAL PHALANGEAL JOINT ARTHRODESIS ;  Surgeon: Wylene Simmer, MD;  Location: [facility];  Service: Orthopedics;  Laterality: Right;  . Right heart catheterization N/A 11/28/2013    Procedure: RIGHT HEART CATH;  Surgeon: Jolaine Artist, MD;  Location: [facility] CATH LAB;  Service: Cardiovascular;  Laterality: N/A;       The following portions of the patient's history were reviewed and updated as appropriate: Allergies, current medications, and problem list.    Review of Systems:   Patient denies headache, fevers, malaise, unintentional weight loss, skin rash, eye pain, sinus congestion and sinus pain, sore throat, dysphagia,  hemoptysis , cough, dyspnea, wheezing, chest pain, palpitations, orthopnea, edema, abdominal pain, nausea, melena, diarrhea, constipation, flank pain, dysuria, hematuria, urinary  Frequency, nocturia, numbness, tingling, seizures,  Focal weakness, Loss of consciousness,  Tremor, insomnia, depression, anxiety, and suicidal ideation.     History   Social History  . Marital Status: Married    Spouse Name: N/A    Number of Children: N/A  . Years of Education: N/A   Occupational History  . Not on file.   Social History Main Topics  . Smoking status: Former Smoker -- 0.50 packs/day for 40 years    Types: Cigarettes    Quit date: 07/24/2013  . Smokeless tobacco: Never Used     Comment: REMOTELY QUIT  TOBACCO USE  . Alcohol Use: Yes     Comment: occasional  . Drug Use: No  . Sexual Activity: Not Currently   Other Topics Concern  . Not on file   Social History Narrative   Light exercise   LIVES WITH SPOUSE          Objective:  Filed Vitals:   08/10/14 1438  BP: 132/72  Pulse: 78  Temp: 99.5 F (37.5 C)  Resp: 14     General appearance: alert, cooperative and appears stated age Ears: normal TM's and external ear canals both ears Throat: lips, mucosa, and tongue normal; teeth and gums normal Neck: no adenopathy, no carotid bruit, supple, symmetrical, trachea midline and thyroid not enlarged, symmetric, no  tenderness/mass/nodules Back: symmetric, no curvature. ROM normal. No CVA tenderness. Lungs: clear to auscultation bilaterally Heart: regular rate and rhythm, S1, S2 normal, no murmur, click, rub or gallop Abdomen: soft, non-tender; bowel sounds normal; no masses,  no organomegaly Pulses: 2+ and symmetric Skin: Skin color, texture, turgor normal. No rashes or lesions Lymph nodes: Cervical, supraclavicular, and axillary nodes normal.  Assessment and Plan:  Hypertension Suggested she consolidate losartan twice daily dosing to 100 mg at bedtime,  New Rx written   Insomnia due to anxiety and fear List of sedating medications reviewed.  She is taking sinequan on a daily basis, using valium only for muscle spasms,  And ambien rarely prn averaging twice  a week   Osteoarthritis of right knee Authorized increase in use of percocet to three daily,  rx re written   Obesity (BMI 30-39.9) Suggested that she suspend the phentermine during the holidays since she is taking it intermittently.  Resume in January    Updated Medication List Outpatient Encounter Prescriptions as of 08/10/2014  Medication Sig  . Ascorbic Acid (VITAMIN C) 1000 MG tablet Take 500 mg by mouth daily.   . benzonatate (TESSALON) 100 MG capsule Take 1 capsule (100 mg total) by mouth 2 (two) times daily.  Marland Kitchen CLARINEX 5 MG tablet TAKE 1 TABLET (5 MG TOTAL) BY MOUTH EVERY MORNING.  . CRESTOR 20 MG tablet TAKE 1 TABLET (20 MG TOTAL) BY MOUTH DAILY.  . cyanocobalamin (,VITAMIN B-12,) 1000 MCG/ML injection For use with monthly B12 injections  . doxepin (SINEQUAN) 25 MG capsule Take 2 capsules (50 mg total) by mouth at bedtime.  Marland Kitchen doxepin (SINEQUAN) 25 MG capsule TAKE 2 CAPSULES (50 MG TOTAL) BY MOUTH AT BEDTIME.  Marland Kitchen FeFum-FePo-FA-B Cmp-C-Zn-Mn-Cu (SE-TAN PLUS) 162-115.2-1 MG CAPS TAKE 1 CAPSULE BY MOUTH DAILY.  . isosorbide mononitrate (IMDUR) 60 MG 24 hr tablet Take 1 tablet (60 mg total) by mouth daily.  Marland Kitchen LEXAPRO 10 MG tablet TAKE  1 TABLET (10 MG TOTAL) BY MOUTH DAILY.  Marland Kitchen lidocaine (LIDODERM) 5 % Place 3 patches onto the skin daily. PLACE 3 PATCHES ONTO THE SKIN DAILY. REMOVE & DISCARD PATCH WITHIN 12 HOURS OR AS DIRECTED BY MD  . montelukast (SINGULAIR) 10 MG tablet Take 1 tablet (10 mg total) by mouth at bedtime. TAKE 1 TABLET (10 MG TOTAL) BY MOUTH AT BEDTIME.  Marland Kitchen NEEDLE, DISP, 23 G (BD DISP NEEDLE) 23G X 1" MISC Uses to inject b12 into muscle once monthly  . NORVASC 10 MG tablet TAKE 1 TABLET (10 MG TOTAL) BY MOUTH DAILY.  Marland Kitchen OVER THE COUNTER MEDICATION Take 1 capsule by mouth daily. Florify  . oxyCODONE-acetaminophen (PERCOCET) 10-325 MG per tablet Take 1 tablet by mouth every 8 (eight) hours as needed for pain.  . phentermine (ADIPEX-P) 37.5 MG tablet 1/2 tablet once or twice daily for appetite suppression  . sodium chloride (OCEAN) 0.65 % nasal spray Place 2 sprays into the nose as needed. Allergies    . spironolactone (ALDACTONE) 25 MG tablet Take 1 tablet (25 mg total) by mouth daily.  Marland Kitchen SYNTHROID 150 MCG tablet TAKE 1 TABLET (150 MCG TOTAL) BY MOUTH DAILY.  Marland Kitchen VALIUM 5 MG tablet TAKE 1 TABLET TWICE A DAY  . zolpidem (AMBIEN) 5 MG tablet TAKE 1 TABLET BY MOUTH AT BEDTIME prn  . [DISCONTINUED] furosemide (LASIX) 20 MG tablet Take 20-40 mg by mouth daily as needed for fluid.  . [DISCONTINUED] omega-3 acid ethyl esters (LOVAZA) 1 G capsule Take 2 capsules (2 g total) by mouth 2 (two) times daily.  . [DISCONTINUED] oxyCODONE-acetaminophen (PERCOCET) 10-325 MG per tablet Take 1 tablet by mouth 2 (two) times daily as needed for pain.  Marland Kitchen losartan (COZAAR) 100 MG tablet Take 1 tablet (100 mg total) by mouth daily.  . [DISCONTINUED] losartan (COZAAR) 50 MG tablet Take 1 tablet (50 mg total) by mouth 2 (two) times daily.  . [DISCONTINUED] omega-3 acid ethyl esters (LOVAZA) 1 G capsule Take 2 g by mouth 2 (two) times daily. TAKE 2 CAPSULES TWICE A DAY     No orders of the defined types were placed in this encounter.    No  Follow-up on file.

## 2014-08-11 NOTE — Assessment & Plan Note (Addendum)
Suggested she consolidate losartan twice daily dosing to 100 mg at bedtime,  New Rx written

## 2014-08-11 NOTE — Assessment & Plan Note (Signed)
Authorized increase in use of percocet to three daily,  rx re written

## 2014-08-11 NOTE — Assessment & Plan Note (Signed)
Suggested that she suspend the phentermine during the holidays since she is taking it intermittently.  Resume in January

## 2014-08-11 NOTE — Assessment & Plan Note (Signed)
List of sedating medications reviewed.  She is taking sinequan on a daily basis, using valium only for muscle spasms,  And ambien rarely prn averaging twice a week

## 2014-08-15 ENCOUNTER — Other Ambulatory Visit: Payer: Self-pay | Admitting: Internal Medicine

## 2014-08-18 ENCOUNTER — Other Ambulatory Visit: Payer: Self-pay

## 2014-08-20 ENCOUNTER — Ambulatory Visit (INDEPENDENT_AMBULATORY_CARE_PROVIDER_SITE_OTHER): Payer: Federal, State, Local not specified - PPO | Admitting: Internal Medicine

## 2014-08-20 ENCOUNTER — Encounter: Payer: Self-pay | Admitting: Internal Medicine

## 2014-08-20 ENCOUNTER — Other Ambulatory Visit (INDEPENDENT_AMBULATORY_CARE_PROVIDER_SITE_OTHER): Payer: Federal, State, Local not specified - PPO

## 2014-08-20 VITALS — BP 120/60 | HR 75 | Temp 98.3°F | Resp 16 | Ht 62.5 in | Wt 209.0 lb

## 2014-08-20 DIAGNOSIS — E559 Vitamin D deficiency, unspecified: Secondary | ICD-10-CM

## 2014-08-20 DIAGNOSIS — E034 Atrophy of thyroid (acquired): Secondary | ICD-10-CM

## 2014-08-20 DIAGNOSIS — I10 Essential (primary) hypertension: Secondary | ICD-10-CM

## 2014-08-20 DIAGNOSIS — E669 Obesity, unspecified: Secondary | ICD-10-CM

## 2014-08-20 DIAGNOSIS — E038 Other specified hypothyroidism: Secondary | ICD-10-CM

## 2014-08-20 DIAGNOSIS — M1711 Unilateral primary osteoarthritis, right knee: Secondary | ICD-10-CM

## 2014-08-20 DIAGNOSIS — D472 Monoclonal gammopathy: Secondary | ICD-10-CM

## 2014-08-20 DIAGNOSIS — Z9989 Dependence on other enabling machines and devices: Secondary | ICD-10-CM

## 2014-08-20 DIAGNOSIS — G4733 Obstructive sleep apnea (adult) (pediatric): Secondary | ICD-10-CM

## 2014-08-20 LAB — COMPREHENSIVE METABOLIC PANEL
ALT: 20 U/L (ref 0–35)
AST: 26 U/L (ref 0–37)
Albumin: 3.5 g/dL (ref 3.5–5.2)
Alkaline Phosphatase: 84 U/L (ref 39–117)
BUN: 22 mg/dL (ref 6–23)
CO2: 27 meq/L (ref 19–32)
Calcium: 9.9 mg/dL (ref 8.4–10.5)
Chloride: 108 mEq/L (ref 96–112)
Creatinine, Ser: 1.2 mg/dL (ref 0.4–1.2)
GFR: 45.68 mL/min — AB (ref >60.00)
GLUCOSE: 99 mg/dL (ref 70–99)
POTASSIUM: 4.1 meq/L (ref 3.5–5.1)
SODIUM: 139 meq/L (ref 135–145)
TOTAL PROTEIN: 6 g/dL (ref 6.0–8.3)
Total Bilirubin: 0.8 mg/dL (ref 0.2–1.2)

## 2014-08-20 LAB — CBC WITH DIFFERENTIAL/PLATELET
BASOS PCT: 0.6 % (ref 0.0–3.0)
Basophils Absolute: 0 10*3/uL (ref 0.0–0.1)
Eosinophils Absolute: 0.2 10*3/uL (ref 0.0–0.7)
Eosinophils Relative: 1.9 % (ref 0.0–5.0)
HEMATOCRIT: 39.4 % (ref 36.0–46.0)
HEMOGLOBIN: 12.9 g/dL (ref 12.0–15.0)
LYMPHS ABS: 3.9 10*3/uL (ref 0.7–4.0)
Lymphocytes Relative: 48 % — ABNORMAL HIGH (ref 12.0–46.0)
MCHC: 32.6 g/dL (ref 30.0–36.0)
MCV: 97.7 fl (ref 78.0–100.0)
MONOS PCT: 6.7 % (ref 3.0–12.0)
Monocytes Absolute: 0.5 10*3/uL (ref 0.1–1.0)
Neutro Abs: 3.5 10*3/uL (ref 1.4–7.7)
Neutrophils Relative %: 42.8 % — ABNORMAL LOW (ref 43.0–77.0)
PLATELETS: 181 10*3/uL (ref 150.0–400.0)
RBC: 4.04 Mil/uL (ref 3.87–5.11)
RDW: 12.4 % (ref 11.5–15.5)
WBC: 8.1 10*3/uL (ref 4.0–10.5)

## 2014-08-20 LAB — LIPID PANEL
CHOLESTEROL: 173 mg/dL (ref 0–200)
HDL: 51 mg/dL (ref >39.00)
LDL CALC: 95 mg/dL (ref 0–99)
NONHDL: 122
Total CHOL/HDL Ratio: 3
Triglycerides: 137 mg/dL (ref 0.0–149.0)
VLDL: 27.4 mg/dL (ref 0.0–40.0)

## 2014-08-20 LAB — TSH: TSH: 0.79 u[IU]/mL (ref 0.35–4.50)

## 2014-08-20 LAB — VITAMIN D 25 HYDROXY (VIT D DEFICIENCY, FRACTURES): VITD: 23.13 ng/mL — ABNORMAL LOW (ref 30.00–100.00)

## 2014-08-20 MED ORDER — PHENTERMINE HCL 37.5 MG PO TABS: ORAL_TABLET | ORAL | Status: DC

## 2014-08-20 NOTE — Progress Notes (Signed)
Pre-visit discussion using our clinic review tool. No additional management support is needed unless otherwise documented below in the visit note.  

## 2014-08-20 NOTE — Progress Notes (Signed)
Patient ID: [name], female   DOB: 1940/12/19, 73 y.o.   MRN: [id number]  Patient Active Problem List   Diagnosis Date Noted  . Stress incontinence 04/25/2014  . 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 Murmur 12/31/2013  . Bradycardia 12/31/2013  . Nocturnal hypoxia 10/09/2013  . Shortness of breath 09/23/2013  . History of bacterial pneumonia 09/01/2013  . 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 History of tobacco abuse 09/01/2013  . Other and unspecified hyperlipidemia 03/28/2013  . Preoperative clearance 03/28/2013  . Routine general medical examination at a health care facility 03/27/2013  . OSA on CPAP 03/27/2013  . Cough 03/13/2013  . Insomnia due to anxiety and fear 03/13/2013  . Allergic rhinitis 03/13/2013  . Chronic nausea 12/02/2012  . Obesity (BMI 30-39.9) 03/10/2012  . Monoclonal gammopathy of undetermined significance 03/10/2012  . Anemia 01/01/2012  . Hypothyroidism 01/01/2012  . Osteoarthritis of right knee 07/12/2011  . Brain aneurysm 04/18/2011  . Chronic back pain greater than 3 months duration 04/18/2011  . Breast screening, unspecified 04/18/2011  . Hypertension 04/07/2011    Subjective:  CC:   Chief Complaint  Patient presents with  . Follow-up    3 month,     HPI:   [name] is a 73 y.o. female who presents for 3 month follow up on chronic conditions including obesity complicated by OSA,  hypertension , and chronic back pain and knee pain  with daily use of percocet.  She has not been following a careful diet over the holidays and has suspended the appetite suppressant at my advice until she is ready to resume a weight loss plan.  She is taking her other medications as directed and has no acute issues today.  During a recent walk through her family's cemetary she twisted her back and her knee and has been using her percocet more frequently.  She has  requested an increase in her percocet use  to 3 tablets daily .  She denies swelling of knee and radicular pain  Her pain is aggravated by housework.    Obesity:  She has been taking phentermine since september, and has only lost 7 lbs  (goal was 10 lbs) .  She cites the holidays as the cause of her inadequate weight loss and intermittent use of the medication.    Past Medical History  Diagnosis Date  . Degenerative disc disease     HERNITAED DISC BY MRI ON 07/07;S/P LAMINECTOMY,RID REPLACEMENT  . Brain aneurysm     S/P RESECTION  . Discoid lupus erythematosus eyelid     NEGATIVE SEROLOGIC MARKERS  . Hypercholesterolemia   . Chronic sinusitis   . Hypertension   . Dysrhythmia     PVC's occ.  . Pneumonia 05/30/2010    after receiving Propofol  . Headache(784.0)     migraines occ.  . CHF (congestive heart failure) 05/2010    after receiving Propofol  . Hypothyroidism     takes Synthroid  . Blood transfusion 2009    back surgery  . Asthma 06/2010    depending on what around  . Complication of anesthesia 05/30/2010    from colonoscopy-had Propofol-had a  . Complication of anesthesia     reaction-went into asthma-pneumonia,-  . Complication of anesthesia     then CHF and to ICU  . Sleep apnea     uses CPAP-8.4-oxygen suppliment    Past Surgical History  Procedure Laterality Date  .  Cosmetic surgery    . Tonsillectomy  1963  . Back surgery  1968,1988,2002,2009    4 laminectomies and fusions  . Abdominal hysterectomy  1988    uterus only  . Brain surgery  2005    coiling of cranial aneurysum  . Excision morton's neuroma  1990    left foot  . Nasal sinus surgery  1977-2008  . Lipomas  1975    3 from right breast  . Tympanic membrane repair  2009-last one    x5 times  . Total knee arthroplasty  07/10/2011    Procedure: TOTAL KNEE ARTHROPLASTY;  Surgeon: Gearlean Alf;  Location: [facility];  Service: Orthopedics;  Laterality: Right;  . Joint replacement  2013    Right knee,  [place]  . Eye surgery      both  cataracts -[facility] 7/14  . Foot arthrodesis Right 05/22/2013    Procedure: RIGHT HALLUX METATARSAL PHALANGEAL JOINT ARTHRODESIS ;  Surgeon: Wylene Simmer, MD;  Location: [facility];  Service: Orthopedics;  Laterality: Right;  . Right heart catheterization N/A 11/28/2013    Procedure: RIGHT HEART CATH;  Surgeon: Jolaine Artist, MD;  Location: [facility] CATH LAB;  Service: Cardiovascular;  Laterality: N/A;       The following portions of the patient's history were reviewed and updated as appropriate: Allergies, current medications, and problem list.    Review of Systems:   Patient denies headache, fevers, malaise, unintentional weight loss, skin rash, eye pain, sinus congestion and sinus pain, sore throat, dysphagia,  hemoptysis , cough, dyspnea, wheezing, chest pain, palpitations, orthopnea, edema, abdominal pain, nausea, melena, diarrhea, constipation, flank pain, dysuria, hematuria, urinary  Frequency, nocturia, numbness, tingling, seizures,  Focal weakness, Loss of consciousness,  Tremor, insomnia, depression, anxiety, and suicidal ideation.     History   Social History  . Marital Status: Married    Spouse Name: N/A    Number of Children: N/A  . Years of Education: N/A   Occupational History  . Not on file.   Social History Main Topics  . Smoking status: Former Smoker -- 0.50 packs/day for 40 years    Types: Cigarettes    Quit date: 07/24/2013  . Smokeless tobacco: Never Used     Comment: REMOTELY QUIT  TOBACCO USE  . Alcohol Use: Yes     Comment: occasional  . Drug Use: No  . Sexual Activity: Not Currently   Other Topics Concern  . Not on file   Social History Narrative   Light exercise   LIVES WITH SPOUSE          Objective:  Filed Vitals:   08/20/14 0957  BP: 120/60  Pulse: 75  Temp: 98.3 F (36.8 C)  Resp: 16     General appearance: alert, cooperative and appears stated age Ears: normal TM's and external ear canals both ears Throat: lips,  mucosa, and tongue normal; teeth and gums normal Neck: no adenopathy, no carotid bruit, supple, symmetrical, trachea midline and thyroid not enlarged, symmetric, no tenderness/mass/nodules Back: symmetric, no curvature. ROM normal. No CVA tenderness. Lungs: clear to auscultation bilaterally Heart: regular rate and rhythm, S1, S2 normal, no murmur, click, rub or gallop Abdomen: soft, non-tender; bowel sounds normal; no masses,  no organomegaly Pulses: 2+ and symmetric Skin: Skin color, texture, turgor normal. No rashes or lesions Lymph nodes: Cervical, supraclavicular, and axillary nodes normal.  Assessment and Plan:  Hypertension Well controlled on current regimen. Renal function stable, no changes today.  Lab  Results  Component Value Date   CREATININE 1.2 08/20/2014   Lab Results  Component Value Date   NA 139 08/20/2014   K 4.1 08/20/2014   CL 108 08/20/2014   CO2 27 08/20/2014     OSA on CPAP Diagnosed by sleep study. She is wearing her CPAP every night a minimum of 6 hours per night and notes improved daytime wakefulness and decreased fatigue   Hypothyroidism Thyroid function is WNL and < 1.0 on current dose.  No current changes needed.   Lab Results  Component Value Date   TSH 0.79 08/20/2014     Osteoarthritis of right knee Aggravated by recent walk on uneven surfaces during a cemetary visit. I have authorized increase in use of percocet to three daily when needed  rx re written     Monoclonal gammopathy of undetermined significance She has had no progression by Surveillance by [facility] Ormond every 6 months and her hgb is normal.   Lab Results  Component Value Date   WBC 8.1 08/20/2014   HGB 12.9 08/20/2014   HCT 39.4 08/20/2014   MCV 97.7 08/20/2014   PLT 181.0 08/20/2014     Obesity (BMI 30-39.9) Her BMI remains unchanged despite application of low GI diet, regular exercise, and phentermine . We discussed the nature and quality of her exercises well as of  her diet. Usually what I find is that people are not exercising as vigorously as they should to achieve a sustained heart rate in the aerobic zone. I suggested that she consider joining a gym and using a personal trainer to help guide her efforts.   I also am advising her to get back on the low GI diet using six smaller meals a day to stimulate her metabolism.  Suggested that she suspend the phentermine during the holidays since she is taking it intermittently.  Resume in January .  Goal is 10 lbs weight loss by return visit in 3 months or the medication will be discontinued.   Wt Readings from Last 3 Encounters:  08/20/14 209 lb (94.802 kg)  08/10/14 207 lb 8 oz (94.121 kg)  04/22/14 208 lb 4 oz (94.462 kg)    Updated Medication List Outpatient Encounter Prescriptions as of 08/20/2014  Medication Sig  . Ascorbic Acid (VITAMIN C) 1000 MG tablet Take 500 mg by mouth daily.   . benzonatate (TESSALON) 100 MG capsule Take 1 capsule (100 mg total) by mouth 2 (two) times daily.  Marland Kitchen CLARINEX 5 MG tablet TAKE 1 TABLET (5 MG TOTAL) BY MOUTH EVERY MORNING.  . CRESTOR 20 MG tablet TAKE 1 TABLET (20 MG TOTAL) BY MOUTH DAILY.  . cyanocobalamin (,VITAMIN B-12,) 1000 MCG/ML injection For use with monthly B12 injections  . doxepin (SINEQUAN) 25 MG capsule Take 2 capsules (50 mg total) by mouth at bedtime.  . FeFum-FePo-FA-B Cmp-C-Zn-Mn-Cu (SE-TAN PLUS) 162-115.2-1 MG CAPS TAKE 1 CAPSULE BY MOUTH DAILY.  . isosorbide mononitrate (IMDUR) 60 MG 24 hr tablet Take 1 tablet (60 mg total) by mouth daily.  Marland Kitchen LEXAPRO 10 MG tablet TAKE 1 TABLET (10 MG TOTAL) BY MOUTH DAILY.  Marland Kitchen lidocaine (LIDODERM) 5 % Place 3 patches onto the skin daily. PLACE 3 PATCHES ONTO THE SKIN DAILY. REMOVE & DISCARD PATCH WITHIN 12 HOURS OR AS DIRECTED BY MD  . losartan (COZAAR) 100 MG tablet Take 1 tablet (100 mg total) by mouth daily.  Marland Kitchen NEEDLE, DISP, 23 G (BD DISP NEEDLE) 23G X 1" MISC Uses to inject b12 into muscle  once monthly  . NORVASC  10 MG tablet TAKE 1 TABLET (10 MG TOTAL) BY MOUTH DAILY.  Marland Kitchen omega-3 acid ethyl esters (LOVAZA) 1 G capsule Take 2 capsules (2 g total) by mouth 2 (two) times daily.  Marland Kitchen OVER THE COUNTER MEDICATION Take 1 capsule by mouth daily. Florify  . oxyCODONE-acetaminophen (PERCOCET) 10-325 MG per tablet Take 1 tablet by mouth every 8 (eight) hours as needed for pain.  Marland Kitchen SINGULAIR 10 MG tablet TAKE 1 TABLET BY MOUTH AT BEDTIME  . sodium chloride (OCEAN) 0.65 % nasal spray Place 2 sprays into the nose as needed. Allergies    . spironolactone (ALDACTONE) 25 MG tablet Take 1 tablet (25 mg total) by mouth daily.  Marland Kitchen SYNTHROID 150 MCG tablet TAKE 1 TABLET (150 MCG TOTAL) BY MOUTH DAILY.  Marland Kitchen VALIUM 5 MG tablet TAKE 1 TABLET TWICE A DAY  . zolpidem (AMBIEN) 5 MG tablet TAKE 1 TABLET BY MOUTH AT BEDTIME prn  . phentermine (ADIPEX-P) 37.5 MG tablet 1/2 tablet once or twice daily for appetite suppression  . [DISCONTINUED] doxepin (SINEQUAN) 25 MG capsule TAKE 2 CAPSULES (50 MG TOTAL) BY MOUTH AT BEDTIME.  . [DISCONTINUED] phentermine (ADIPEX-P) 37.5 MG tablet 1/2 tablet once or twice daily for appetite suppression (Patient not taking: Reported on 08/20/2014)     No orders of the defined types were placed in this encounter.    No Follow-up on file.

## 2014-08-20 NOTE — Patient Instructions (Addendum)
I recommend getting the majority of your calcium and Vitamin D  through diet rather than supplements given the recent association of calcium supplements with increased coronary artery calcium scores  Unsweetened almond/coconut milk  OR SOY MILK ARE  great low calorie low carb sources of dietary calcium and vitamin D.   OTC vaginal lubricant are non hormonal;  Astroglide and [place] Personal lubricant   Your goal with heart rate during exercise is 100 to 110.  I have authorized the use of phentermine for 3 months.     If you have not lost  10 lbs by the time you return in 3 months we will have to discontinue it and try something less addicting

## 2014-08-22 ENCOUNTER — Encounter: Payer: Self-pay | Admitting: Internal Medicine

## 2014-08-22 DIAGNOSIS — Z0000 Encounter for general adult medical examination without abnormal findings: Secondary | ICD-10-CM | POA: Insufficient documentation

## 2014-08-22 DIAGNOSIS — Z13820 Encounter for screening for osteoporosis: Secondary | ICD-10-CM

## 2014-08-22 NOTE — Assessment & Plan Note (Signed)
No prior DEXA of records,  Ordered today

## 2014-08-22 NOTE — Assessment & Plan Note (Signed)
Her BMI remains unchanged despite application of low GI diet, regular exercise, and phentermine . We discussed the nature and quality of her exercises well as of her diet. Usually what I find is that people are not exercising as vigorously as they should to achieve a sustained heart rate in the aerobic zone. I suggested that she consider joining a gym and using a personal trainer to help guide her efforts.   I also am advising her to get back on the low GI diet using six smaller meals a day to stimulate her metabolism.  Suggested that she suspend the phentermine during the holidays since she is taking it intermittently.  Resume in January .  Goal is 10 lbs weight loss by return visit in 3 months or the medication will be discontinued.   Wt Readings from Last 3 Encounters:  08/20/14 209 lb (94.802 kg)  08/10/14 207 lb 8 oz (94.121 kg)  04/22/14 208 lb 4 oz (94.462 kg)

## 2014-08-22 NOTE — Assessment & Plan Note (Signed)
Aggravated by recent walk on uneven surfaces during a cemetary visit. I have authorized increase in use of percocet to three daily when needed  rx re written

## 2014-08-22 NOTE — Assessment & Plan Note (Signed)
Diagnosed by sleep study. She is wearing her CPAP every night a minimum of 6 hours per night and notes improved daytime wakefulness and decreased fatigue  

## 2014-08-22 NOTE — Assessment & Plan Note (Signed)
She has had no progression by Surveillance by [facility] Ormond every 6 months and her hgb is normal.   Lab Results  Component Value Date   WBC 8.1 08/20/2014   HGB 12.9 08/20/2014   HCT 39.4 08/20/2014   MCV 97.7 08/20/2014   PLT 181.0 08/20/2014

## 2014-08-22 NOTE — Assessment & Plan Note (Signed)
Well controlled on current regimen. Renal function stable, no changes today.  Lab Results  Component Value Date   CREATININE 1.2 08/20/2014   Lab Results  Component Value Date   NA 139 08/20/2014   K 4.1 08/20/2014   CL 108 08/20/2014   CO2 27 08/20/2014

## 2014-08-22 NOTE — Assessment & Plan Note (Signed)
Thyroid function is WNL and < 1.0 on current dose.  No current changes needed.   Lab Results  Component Value Date   TSH 0.79 08/20/2014

## 2014-08-23 ENCOUNTER — Other Ambulatory Visit: Payer: Self-pay | Admitting: Internal Medicine

## 2014-08-23 ENCOUNTER — Encounter: Payer: Self-pay | Admitting: Internal Medicine

## 2014-08-23 DIAGNOSIS — E785 Hyperlipidemia, unspecified: Secondary | ICD-10-CM

## 2014-08-23 DIAGNOSIS — R5383 Other fatigue: Secondary | ICD-10-CM

## 2014-09-01 ENCOUNTER — Other Ambulatory Visit: Payer: Self-pay | Admitting: Internal Medicine

## 2014-09-15 ENCOUNTER — Other Ambulatory Visit: Payer: Self-pay | Admitting: Internal Medicine

## 2014-09-18 ENCOUNTER — Telehealth: Payer: Self-pay | Admitting: *Deleted

## 2014-09-18 MED ORDER — OXYCODONE-ACETAMINOPHEN 10-325 MG PO TABS: 1.0000 | ORAL_TABLET | Freq: Three times a day (TID) | ORAL | Status: DC | PRN

## 2014-09-18 NOTE — Telephone Encounter (Signed)
Ok to refill,  printed rx  

## 2014-09-18 NOTE — Telephone Encounter (Signed)
Pt called requesting Percocet refill.  Last refill 12.21.15, last OV 12.31.15.  Please advise refill.

## 2014-09-21 NOTE — Telephone Encounter (Signed)
Pt aware rx ready for pick up. 

## 2014-09-28 ENCOUNTER — Other Ambulatory Visit: Payer: Self-pay | Admitting: Internal Medicine

## 2014-10-01 ENCOUNTER — Other Ambulatory Visit: Payer: Self-pay | Admitting: Internal Medicine

## 2014-10-20 ENCOUNTER — Telehealth: Payer: Self-pay | Admitting: *Deleted

## 2014-10-20 MED ORDER — OXYCODONE-ACETAMINOPHEN 10-325 MG PO TABS: 1.0000 | ORAL_TABLET | Freq: Three times a day (TID) | ORAL | Status: DC | PRN

## 2014-10-20 NOTE — Telephone Encounter (Signed)
Pt aware Rx ready for pick up tomorrow as requested.

## 2014-10-20 NOTE — Telephone Encounter (Signed)
Ok to refill,  printed rx  

## 2014-10-20 NOTE — Telephone Encounter (Addendum)
Pt called requesting Percocet refill.  Last refill 1.29.16, last OV 12.31.15.  Please advise refill

## 2014-10-24 ENCOUNTER — Other Ambulatory Visit: Payer: Self-pay | Admitting: Internal Medicine

## 2014-10-26 NOTE — Telephone Encounter (Signed)
Ok to refill,  printed rx  

## 2014-10-26 NOTE — Telephone Encounter (Signed)
Last OV 12.31.15, last refill 1.23.16.  Please advise refill

## 2014-10-26 NOTE — Telephone Encounter (Signed)
rx faxed

## 2014-11-08 ENCOUNTER — Other Ambulatory Visit: Payer: Self-pay | Admitting: Internal Medicine

## 2014-11-09 NOTE — Telephone Encounter (Signed)
Called to pharmacy 

## 2014-11-09 NOTE — Telephone Encounter (Signed)
90 day supply authorized.

## 2014-11-09 NOTE — Telephone Encounter (Signed)
Last visit 08/20/14

## 2014-11-11 MED ORDER — VITAMIN D (ERGOCALCIFEROL) 1.25 MG (50000 UNIT) PO CAPS: 50000.0000 [IU] | ORAL_CAPSULE | ORAL | Status: DC

## 2014-11-11 NOTE — Telephone Encounter (Signed)
Called and notified pt of labs,  verbalized understanding. Rx sent to pharmacy by escript

## 2014-11-20 ENCOUNTER — Other Ambulatory Visit: Payer: Self-pay | Admitting: *Deleted

## 2014-11-20 ENCOUNTER — Other Ambulatory Visit: Payer: Self-pay | Admitting: Internal Medicine

## 2014-11-20 NOTE — Telephone Encounter (Signed)
Pt called requesting Percocet refill.  Last refill 3.1.16, last OV 12.31.15.  Please advise refill

## 2014-11-20 NOTE — Telephone Encounter (Signed)
Cough capsules refilled

## 2014-11-20 NOTE — Telephone Encounter (Signed)
Last seen 08/20/14, ok refill?

## 2014-11-23 MED ORDER — OXYCODONE-ACETAMINOPHEN 10-325 MG PO TABS: 1.0000 | ORAL_TABLET | Freq: Three times a day (TID) | ORAL | Status: DC | PRN

## 2014-11-23 NOTE — Telephone Encounter (Signed)
Pt notified Rx ready for pickup 

## 2014-11-23 NOTE — Telephone Encounter (Signed)
Ok to refill,  printed rx  

## 2014-12-11 NOTE — Op Note (Signed)
PATIENT NAMELOUVINIA, Lewis MR#:  [id number] DATE OF BIRTH:  1941-01-16  DATE OF PROCEDURE:  04/28/2013  PREOPERATIVE DIAGNOSIS: Cataract, left eye.   POSTOPERATIVE DIAGNOSIS: Cataract, left eye.  PROCEDURE PERFORMED: Extracapsular cataract extraction using phacoemulsification with placement of Alcon [id number] 10.0-diopter posterior chamber lens with 2.25 diopter cylinder, serial number [id number].   SURGEON: Loura Back. Searra Carnathan, M.D.   ANESTHESIA: 4% lidocaine and 0.75% Marcaine a 50-50 mixture with 10 units/mL of HyoMax added, given as a peribulbar.  ANESTHESIOLOGIST: Dr. Andree Elk.   COMPLICATIONS: None.   ESTIMATED BLOOD LOSS: Less than 1 mL.   DESCRIPTION OF PROCEDURE: The patient was brought to the operating room and both eyes were anesthetized with topical proparacaine. With the patient sitting upright and fixing at a distant target, an ASICO toric marker was used to mark the 3:00 and 9:00 positions. The 6:00 position was marked visually in reference to the other to 2 marks. The patient was then placed supine, given IV sedation and peribulbar block. She was prepped and draped in the usual fashion. The vertical rectus muscles were imbricated using 5-0 silk sutures and bridle sutures. Toric marker was brought to the table and placed over the 3 marks that had been made. The 90 degree position was marked and then the 88 degree position was marked. The conjunctiva was taken down at 90 degrees and hemostasis obtained with cautery. An additional subconjunctival mixture of lidocaine and Marcaine was injected because the patient, on the previous eye, had discomfort from distention of the eye. Hemostasis obtained with cautery. A partial thickness scleral groove was made at 90 degrees at the surgical limbus. This was dissected anteriorly into clear cornea with an Alcon crescent knife. The anterior chamber was entered superotemporally through clear cornea with a paracentesis knife and through the lamellar  dissection with a 2.6 mm keratome. DisCoVisc was used to replace the aqueous and a continuous tear circular capsulorrhexis was carried out.   Hydrodelineation was used to loosen the nucleus and phacoemulsification was carried out in a divide and conquer technique. The lens was 4+ hard and it required a combination of both the Salz nucleus splitter and the chopper in the kit. Total ultrasound time was 3 minutes and 29 seconds with an average power of 27.2%. CDE 97.08. There was also some bleeding from the angle that caused no particular difficulties. Irrigation-aspiration was then used to remove the residual cortex. The pupil had come down to a small size at that point and there was some difficulty getting up underneath the capsule. Care was taken not to rupture the capsule or grab the anterior capsule. The intraocular lens was inserted in the capsular bag using a [employer]. The lens was oriented vertically in line with the 88 degree. The marked inferiorly at the base of the haptics was easy to see, but because of corneal distention, bending and the depth of the chamber, the superior haptics marking was more difficult to see. It appeared to be lined up with the 88 degree mark as well. Irrigation-aspiration was used to remove the residual DisCoVisc. It was also used to remove some of the residual cortex. Single 10-0 nylon suture was placed across the wound and the wound was hydrated using balanced salt. Miostat was injected in the paracentesis tract to induce miosis and deepen the chamber. No antibiotic was injected due to a penicillin allergy. The wound was checked for leaks. None were found. The conjunctiva was closed with cautery. The bridle sutures were removed. Three blocks  drops of Vigamox were placed in the eye. A shield was placed on the eye and the patient was discharged to the recovery room in good condition.   ____________________________ Loura Back Kristi Voigt, MD sad:aw D: 04/28/2013 14:03:21  ET T: 04/28/2013 15:02:15 ET JOB#: [id number]  cc: Remo Lipps A. Weston Kallman, MD, <Dictator> Martie Lee MD ELECTRONICALLY SIGNED 05/05/2013 13:11

## 2014-12-11 NOTE — Discharge Summary (Signed)
PATIENT NAMELORETTO, [name] MR#:  [id number] DATE OF BIRTH:  Nov 19, 1940  DATE OF ADMISSION:  07/26/2013 DATE OF DISCHARGE:  08/11/2013  For a detailed note, please take a look at the history and physical done on admission by Dr. Bridgette Habermann. Please also take a look at the interim discharge summary done by me which covers hospital course from December 6 until December 13. Please also look at the detailed interim summary done by Dr. Anselm Jungling which covers hospital course from December 14 until December 19.  This covers hospital course from the 20th to the 22nd.   CONSULTANTS DURING THE HOSPITAL COURSE: Dr. Mortimer Fries from pulmonary critical care.   DISCHARGE DIAGNOSES: 1.  Acute respiratory failure secondary to acute respiratory distress syndrome. 2.  Acute respiratory distress syndrome secondary to Legionella pneumonia.  3.  Legionella pneumonia.  4.  Acute renal failure, now resolved.  5.  Hypertension.  6.  Hypothyroidism.  7.  Depression.  8.  Hyperlipidemia.   DISCHARGE INSTRUCTIONS: The patient is being discharged on a low sodium, low fat diet. Activity is as tolerated. Follow up with Dr. Deborra Medina in the next 1 to 2 weeks.   DISCHARGE MEDICATIONS: 1.  Vitamin C 500 mg 2 tabs daily. 2.  Norvasc 10 mg daily. 3.  Synthroid 150 mcg daily. 4.  Crestor 20 mg daily. 5.  Hydralazine 50 mg b.i.d. 6.  Lexapro 10 mg daily. 7.  Tessalon Perles q. 4 hours as needed. 8.  Clarinex 5 mg daily. 9.  Singular 10 mg daily. 10.  Ambien 5 mg at bedtime. 11.  Sinequan 50 mg at bedtime. 12.  Imdur 60 mg daily. 13.  Vitamin B12 1000 mcg intramuscular monthly. 14.  Cozaar 50 mg b.i.d.  15.  Lovaza 2000 mg b.i.d.  16.  Nebivolol 5 mg daily. 17.  Multivitamin daily. 18.  Valium 5 mg b.i.d. 19.  Lidoderm patch apply 3 patches to the affected area, on 12 hours off 12 hours. 20.  Albuterol inhaler 2 puffs q. 6 hours as needed. 21.  Albuterol nebulizer q. 6 hours as needed. 22.  Percocet 10/325 mg 1 tab q. 6  hours as needed. 23.  Spiriva 1 puff daily. 24.  Advair 250/50 one puff b.i.d.  25.  Nystatin swish and swallow 5 mL q.i.d. x7 days.   HOSPITAL COURSE: This is a 74 year old female with medical problems as mentioned above who presented to the hospital originally due to shortness of breath and noted to be in acute respiratory failure secondary to pneumonia.  1.  Acute respiratory failure. The most likely cause of the patient's respiratory failure was pneumonia leading to acute respiratory distress syndrome. The patient, when she originally presented to the hospital, was placed on BiPAP, but was not doing well. Therefore, she was urgently intubated. The patient remained intubated for about 10 days and was then extubated. She was initially on broad-spectrum antibiotics including IV vancomycin, cefepime, and also Levaquin. Her Legionella antigen turned out to be positive. Therefore, her antibiotics were narrowed down to just Levaquin. She has finished a 10 day course of Levaquin for Legionella pneumonia. She was also given IV steroids and full vent support given her respiratory failure. She has been extubated now since December 17 and is on 2 liters nasal cannula oxygen and doing well. She has no acute respiratory issues presently.  2.  Pneumonia. This was likely Legionella pneumonia progressing to ARDS. The patient was initially treated with broad-spectrum antibiotics given her worsening respiratory failure. It  was eventually narrowed down to just to IV Levaquin and she has finished 10 days of Levaquin for her Legionella pneumonia.  3.  Acute renal failure. This was likely secondary to sepsis and dehydration. The patient was hydrated with IV fluids. Her creatinine since then has improved and is currently at baseline. 4.  Hypotension/shock. This was likely septic shock from her pneumonia and respiratory failure. She was aggressively hydrated with IV fluids and intermittently placed on vasopressors. She has now  been off vasopressors and IV fluids for quite a while and is clinically doing well and hemodynamically stable.  5.  Hypertension. The patient's blood pressure has been labile over the past few days but has improved on the current management. She is currently being discharged on her Bystolic, Imdur, Norvasc, losartan, and hydralazine as stated. Her further titrations to antihypertensives can be done when she goes to the skilled nursing facility.  6.  Hypothyroidism. The patient was maintained on her Synthroid. She will continue that.  7.  Anxiety/depression. The patient has been maintained on her doxepin and Lexapro. She will resume that. 8.  Hyperlipidemia. The patient has been maintained on her Crestor and she will resume that.  9.   COPD. The patient did have ARDS and COPD exacerbation on admission, but has significantly improved since admission. She is now off IV steroids. She apparently was supposed to be on scheduled inhalers for her COPD, but was not compliant with it. At this point, I am discharging her on some Advair, Spiriva and p.r.n. DuoNebs and albuterol inhaler as needed.    The patient was evaluated by physical therapy and thought she would benefit from short-term rehab, which is where she is currently being discharged to. The patient is a DNI/DNR.   TIME ON DISCHARGE: 45 minutes.  ____________________________ Belia Heman. Verdell Carmine, MD vjs:sb D: 08/11/2013 14:00:45 ET T: 08/11/2013 14:31:45 ET JOB#: [id number]  cc: Belia Heman. Verdell Carmine, MD, <Dictator> Deborra Medina, MD Henreitta Leber MD ELECTRONICALLY SIGNED 08/14/2013 13:41

## 2014-12-11 NOTE — Op Note (Signed)
PATIENT NAMEISHIKA, [name] MR#:  [id number] DATE OF BIRTH:  06-28-1941  DATE OF PROCEDURE:  03/31/2013  PREOPERATIVE DIAGNOSIS: Cataract, right eye.   POSTOPERATIVE DIAGNOSIS:  Cataract, right eye.  PROCEDURE PERFORMED: Extracapsular cataract extraction using phacoemulsification with placement of an Alcon [id number], 9.5-diopter posterior chamber lens with 1.5-diopter cylinder, serial number [id number].   SURGEON: Kristi Back. Vikkie Goeden, M.D.   ANESTHESIA: 4% lidocaine and 0.75% Marcaine, a 50-50 mixture with 10 units/mL of HyoMax given as a peribulbar.   ANESTHESIOLOGIST: Dr. [place] Service.   COMPLICATIONS: None.   ESTIMATED BLOOD LOSS: Less than 1 mL.   DESCRIPTION OF PROCEDURE: The patient was brought to the operating room and each eye was anesthetized with topical proparacaine. With the patient sitting upright and fixing at a target out in front of her, the 3:00, 6:00 and 9:00 positions were marked on the cornea. An ASICO toric marker was used for the horizontal marks. The patient was placed supine, given IV sedation and peribulbar block. She was prepped and draped in the usual fashion. The vertical rectus muscles were imbricated using 5-0 silk sutures and bridle sutures. A toric marker was brought to the table and the 85 degree position was marked on the cornea. Cautery was performed at the limbus in this position and a partial thickness scleral groove was made at the posterior surgical limbus. This was dissected anteriorly into clear cornea with an Alcon crescent knife. The anterior chamber was then entered superonasally through clear cornea with a paracentesis knife and through the lamellar dissection with a 2.6 mm keratome. DisCoVisc was used to replace the aqueous and a continuous tear circular capsulorrhexis was carried out. Hydrodelineation was used to loosen the nucleus and phacoemulsification was carried out in a divide and conquer technique. Total ultrasound time was three 3s and 3 seconds  with an average power of 25.2% and CDE of 81.02. As a myopic eye, there was some pupillary block and expansion of the globe with some discomfort on the part of the patient. The nucleus itself was very leathery and difficult to crack. This was eventually done and all 4 pieces removed. Irrigation-aspiration was used to remove the residual cortex. The capsular bag was inflated with DisCoVisc and the intraocular lens was inserted using a Librarian, academic. The haptics were lined up with the 85 to 86 degree mark that had been made previously. Position of the inferior haptic had to be identified by pushing the iris back with a Kuglen hook because the stretching the iris had made the pupil come down smaller when the DisCoVisc was out of the eye.   It was noted then there was a small strand of either cortical material or possible vitreous that came to the wound. This was swept out of the way with an iris sweep and the wound was then closed with a single 10-0 nylon suture. Miostat was injected through the paracentesis track to deepen the chamber and induce miosis. The strand then came to paracentesis and again Barraquer sweep was used sweep it out of the way. The paracentesis track was hydrated shut and the strand was loosened in the inferior part of the eye. The wound was checked for leaks. None were found. The suture was rotated and the knot buried superiorly. No cefuroxime was injected into the eye because of a PENICILLIN ALLERGY and 3 drops of Vigamox were placed in the eye. A shield was placed on the eye and the patient was discharged to the recovery area in good condition  ____________________________ Kristi Back Shavell Nored, MD sad:aw D: 03/31/2013 14:02:00 ET T: 03/31/2013 14:41:38 ET JOB#: [id number]  cc: Kristi Lipps A. Jerron Niblack, MD, <Dictator> Kristi Lee MD ELECTRONICALLY SIGNED 04/07/2013 13:55

## 2014-12-12 NOTE — H&P (Signed)
PATIENT NAMEMYAN, [name] MR#:  [id number] DATE OF BIRTH:  Mar 30, 1941  DATE OF ADMISSION:  07/26/2013  REFERRING PHYSICIAN:  Dr. Lenise Arena  PRIMARY CARE PHYSICIAN:  Dr. Derrel Nip  CHIEF COMPLAINT:  Shortness of breath.   HISTORY OF PRESENT ILLNESS: The patient is a pleasant, obese, 74 year old female with history of CHF, unknown type, obstructive sleep apnea, on nightly CPAP, myalgia, hypertension, MGUS. The patient stated that around last Monday or so she started to have fevers, chills, rigors, and a cough with productive sputum, mostly in the morning. The symptoms persisted, and she went to PCPs office, who put her on doxycycline. Since then, she has not felt better, and states that her shortness of breath has been worse. She is short of breath with ambulation. Has had chills. She stated she had a fever of 100.4 this morning, and EMS was called. Per her, her pulse ox this morning was in the 60s. EMS did also find her hypoxic, and started her on nonrebreather and then CPAP. Currently, she is on CPAP, 70% FiO2. X-ray of the lungs has shown bilateral extensive pneumonia, greater on the right. Hospitalist services were contacted for further evaluation and management.   Of note, the patient has had no recent hospitalization; however, has had bunion surgery in October, and a couple of cataract surgeries in the last several months as well. 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 However, states that she finished sometime in September.   PAST MEDICAL HISTORY:  1.  History of left ear abscess with Mycobacterium, as above.  2.  Chronic sinusitis.  3.  Hypertension.  4.  History of discoid lupus erythematosus.  5.  Degenerative disk disease.  6.  History of brain aneurysm.  7.  Hypercholesterolemia.  8.  MGUS.  9.  Myalgia.  10.   History of CHF in the past, but she states she does not have CHF.   11.  Obstructive sleep apnea.  12.  Knee surgery.  13.  Recent cataracts and bunion surgery.   FAMILY HISTORY: Mom with stroke, dad died from old age in his 74s.    SOCIAL HISTORY: Ex-smoker. She states she quit several months ago. Occasional alcohol. No drug use.   ALLERGIES:  PENICILLIN; however, she has taken Keflex in the past without any trouble.  PROPRANOLOL, SEPTRA, TRIAMCINOLONE.   OUTPATIENT MEDICATIONS:  1.  Albuterol nebs every 6 hours as needed. 2.  Albuterol p.r.n.  3.  Ambien 5 mg 1 tab once a day. 4.  Clarinex 5 mg in the morning. 5.  Cozaar 50 mg daily, 2 times.  6.  Crestor 20 mg daily.  7.  Cyanocobalamin 1000 mcg intramuscularly once a month.  8.  Hydralazine 50 mg 2 times a day.  9.  Imdur 60 mg daily.  10.  Lexapro 10 mg daily.  11.  Lidoderm patch 3 patches topically once a day.  12.  Lovaza 2 caps 2 times a day.  13.  Nebivolol 5 mg once a day.  14.  Norvasc 10 mg daily.  15.  Percocet 10/325, 1 tab every 6 hours as needed for pain.  16.  Multivitamin with folic acid 1 cap once a day.  17.  Sinequan 2 caps once a day at bedtime.  18.  Singulair 10 mg  once a day at bedtime.  19.  Sodium chloride nasal spray 2 sprays in each nostril 4 times a day as needed.  20.  Synthroid 150 mcg daily.  21.  Tessalon Perles p.r.n. for cough.  22.  Valium 5 mg 1 tab 2 times a day.  23.  Vitamin C 500 mg 2 tabs once a day.   REVIEW OF SYSTEMS:   CONSTITUTIONAL: Positive for fever and some fatigue.  EYES: No blurry vision or double vision.  ENT: Has no eardrum on the left. Also has had ear infection with Mycobacterium. No epistaxis.  RESPIRATORY: Positive for cough, wheezing, shortness of breath, dyspnea on exertion, and some pleuritic chest pain on the left.  CARDIOVASCULAR: No orthopnea or swelling in the legs. Has history of hypertension. No palpitations.  GASTROINTESTINAL: No nausea, vomiting,  diarrhea, abdominal pain, bloody stools or dark stools.  GENITOURINARY: Denies dysuria or hematuria.  HEMATOLOGIC/LYMPHATIC: Denies anemia or easy bruising.  SKIN: No rashes.  MUSCULOSKELETAL: Denies arthritis or gout. Recent bunion surgery on the right.  NEUROLOGIC: No focal weakness or numbness. Has a headache.  PSYCHIATRIC: Has history of anxiety and depression, but no symptoms.   PHYSICAL EXAMINATION: VITAL SIGNS: Temperature on arrival 98.5, pulse rate 84. Respiratory rate initially was not taken, but while I was in the room, on the CPAP she was breathing in the high 20s. Initial blood pressure 153/50, O2 sat was 81% on oxygen. Currently she is low 90s at rest and high 80s on 70% FiO2 with exertion and not talking.  GENERAL: The patient is an obese female lying in bed, ill-appearing.  HEENT: Normocephalic, atraumatic. Pupils are equal and reactive. Anicteric sclerae. Extraocular muscles intact. Unable to assess mucous membranes. The patient is on CPAP. No significant thyroid tenderness or cervical lymphadenopathy, however.  LUNGS: Bilateral decreased breath sounds mid lungs and bases, and scattered wheezing or rhonchi bilaterally.  CARDIOVASCULAR: S1, S2. No significant tachycardia. No significant murmurs appreciated.  ABDOMEN: Soft, nontender, nondistended. Positive bowel sounds all quadrants.  EXTREMITIES: No significant pedal edema. The patient has a bandage on the right lateral great toe area.  SKIN: No obvious rashes.  NEUROLOGIC: Cranial nerves II through XII grossly intact. Strength is 5/5 all extremities. Sensation is intact to light touch.  PSYCHIATRIC: Awake, alert, oriented x 3.   LABS AND IMAGING: X-ray of the chest as above. White count 12.3, hemoglobin 11, platelets 134. Troponin negative. CK total 78, alk phos 147, AST 43, ALT 23. Magnesium 1.5, BUN 20, creatinine 1.44, sodium 137, potassium 4. EKG: Rate is 85, sinus rhythm, no acute ST elevations or depressions.    ASSESSMENT AND PLAN: We have a 74 year old with obstructive sleep apnea, hypertension, MGUS, chronic sinusitis, with chronic kidney disease, which appears to be stage III at baseline, who presents with severe sepsis, likely secondary to underlying bilateral pneumonia and acute respiratory failure, likely secondary to extensive pneumonia. The patient currently is hypoxic, requiring 70% FiO2 on the CPAP. Given the recent prolonged antibiotics, patient is at high risk of having resistant and complicated pneumonia. The patient will be started on cefepime,  vancomycin and Levaquin for broad-spectrum antibiotic coverage to cover resistant gram-negatives as well as MRSA. Would admit the patient to the CCU, continue the CPAP, obtain a Pulmonary consult, start the patient on standing nebulizers as well. The patient has severe sepsis with leukocytosis, tachypnea, acute respiratory failure, and pneumonia. Would obtain sputum cultures, obtain urine for strep and Legionella antigens as well. Will repeat another x-ray of  the chest in the morning. The patient's CKD stage III is at baseline. Would start patient on some cough medicines as well. Would continue her CAD medications as well as her anxiety and depression medications. Will start her on SCDs and TEDs for deep vein thrombosis prophylaxis, Tylenol p.r.n., some Norco for her chronic pain. Continue her hydralazine and the beta blocker, but hold the Cozaar at this point, and the Norvasc   Total critical care time spent is 50 minutes.   The patient is full code.     ____________________________ Vivien Presto, MD sa:mr D: 07/26/2013 18:01:22 ET T: 07/26/2013 18:26:12 ET JOB#: [id number]  cc: Vivien Presto, MD, <Dictator> Vivien Presto MD ELECTRONICALLY SIGNED 09/02/2013 11:06

## 2014-12-13 NOTE — Op Note (Signed)
PATIENT NAMEDERRISHA, [name] MR#:  [id number] DATE OF BIRTH:  Jan 18, 1941  DATE OF PROCEDURE:  09/29/2011  PREOPERATIVE DIAGNOSIS: Need for IV antibiotics, greater than five days.   POSTOPERATIVE DIAGNOSIS: Need for IV antibiotics, greater than five days.   PROCEDURE PERFORMED: Insertion of left basilic PICC line.   SURGEON: Hortencia Pilar, M.D.   DESCRIPTION OF PROCEDURE: The patient is brought to special procedures and positioned supine with the left arm extended palm upward. Ultrasound is placed in a sterile sleeve. Ultrasound is utilized secondary to lack of appropriate landmarks and to avoid vascular injury. Under direct ultrasound visualization, the basilic vein is identified. It is echolucent, homogeneous, and easily compressible indicating patency. 1% lidocaine is infiltrated into the skin and a micropuncture needle is used to access the basilic vein. Microwire is then advanced. Wire is negotiated to the atrium under fluoroscopy. Measurements are made. A Morpheus single-lumen 4 French PICC line is then trimmed to 42 cm and advanced over the wire through the peel-away sheath. Peel-away sheath and wire are removed. PICC line aspirates well and flushes easily and is secured with a StatLock to the arm. Sterile dressing is applied. The patient tolerated the procedure well and there are no complications.  ____________________________ Katha Cabal, MD ggs:slb D: 09/29/2011 14:58:48 ET T: 09/29/2011 16:14:54 ET JOB#: [id number]  cc: Katha Cabal, MD, <Dictator> Katha Cabal MD ELECTRONICALLY SIGNED 10/16/2011 10:33

## 2014-12-21 ENCOUNTER — Other Ambulatory Visit: Payer: Self-pay | Admitting: *Deleted

## 2014-12-21 ENCOUNTER — Telehealth: Payer: Self-pay | Admitting: *Deleted

## 2014-12-21 MED ORDER — OXYCODONE-ACETAMINOPHEN 10-325 MG PO TABS: 1.0000 | ORAL_TABLET | Freq: Three times a day (TID) | ORAL | Status: DC | PRN

## 2014-12-21 NOTE — Telephone Encounter (Signed)
Refill one 30 days needs 6 month follow up. Visit

## 2014-12-21 NOTE — Telephone Encounter (Signed)
Fax from pharmacy, needing PA for Ambien. Started online and completed, approved. Pharmacy notified.

## 2014-12-21 NOTE — Telephone Encounter (Signed)
Patient requesting refill on Percocet. Last fill 11/23/2014

## 2014-12-22 NOTE — Telephone Encounter (Signed)
Spoke with Timmothy Sours, appt scheduled, pt was notified Rx ready for pickup.

## 2014-12-23 ENCOUNTER — Other Ambulatory Visit: Payer: Self-pay | Admitting: Internal Medicine

## 2014-12-25 ENCOUNTER — Other Ambulatory Visit: Payer: Self-pay | Admitting: Internal Medicine

## 2014-12-28 ENCOUNTER — Other Ambulatory Visit: Payer: Self-pay | Admitting: Internal Medicine

## 2014-12-29 ENCOUNTER — Other Ambulatory Visit: Payer: Self-pay | Admitting: Internal Medicine

## 2015-01-04 ENCOUNTER — Ambulatory Visit (INDEPENDENT_AMBULATORY_CARE_PROVIDER_SITE_OTHER): Payer: Federal, State, Local not specified - PPO | Admitting: Internal Medicine

## 2015-01-04 ENCOUNTER — Encounter: Payer: Self-pay | Admitting: Internal Medicine

## 2015-01-04 ENCOUNTER — Ambulatory Visit
Admission: RE | Admit: 2015-01-04 | Discharge: 2015-01-04 | Disposition: A | Discharge status: Home / Self Care | Payer: Federal, State, Local not specified - PPO | Source: Ambulatory Visit | Attending: Internal Medicine | Admitting: Internal Medicine

## 2015-01-04 ENCOUNTER — Telehealth: Payer: Self-pay | Admitting: Internal Medicine

## 2015-01-04 VITALS — BP 128/62 | HR 78 | Temp 98.3°F | Resp 14 | Ht 62.0 in | Wt 220.5 lb

## 2015-01-04 DIAGNOSIS — R1032 Left lower quadrant pain: Secondary | ICD-10-CM

## 2015-01-04 DIAGNOSIS — I700 Atherosclerosis of aorta: Secondary | ICD-10-CM

## 2015-01-04 DIAGNOSIS — R933 Abnormal findings on diagnostic imaging of other parts of digestive tract: Secondary | ICD-10-CM | POA: Diagnosis not present

## 2015-01-04 DIAGNOSIS — R1031 Right lower quadrant pain: Secondary | ICD-10-CM

## 2015-01-04 DIAGNOSIS — I2510 Atherosclerotic heart disease of native coronary artery without angina pectoris: Secondary | ICD-10-CM

## 2015-01-04 DIAGNOSIS — R300 Dysuria: Secondary | ICD-10-CM | POA: Diagnosis not present

## 2015-01-04 DIAGNOSIS — N200 Calculus of kidney: Secondary | ICD-10-CM | POA: Insufficient documentation

## 2015-01-04 HISTORY — DX: Disorder of kidney and ureter, unspecified: N28.9

## 2015-01-04 LAB — POCT URINALYSIS DIPSTICK
BILIRUBIN UA: NEGATIVE
Blood, UA: NEGATIVE
GLUCOSE UA: NEGATIVE
KETONES UA: NEGATIVE
Nitrite, UA: NEGATIVE
Protein, UA: NEGATIVE
Spec Grav, UA: 1.01
Urobilinogen, UA: 0.2
pH, UA: 6

## 2015-01-04 MED ORDER — IOHEXOL 240 MG/ML SOLN: 25.0000 mL | INTRAMUSCULAR | Status: AC

## 2015-01-04 MED ORDER — IOHEXOL 300 MG/ML  SOLN
80.0000 mL | Freq: Once | INTRAMUSCULAR | Status: AC | PRN
  Administered 2015-01-04: 80 mL via INTRAVENOUS

## 2015-01-04 NOTE — Telephone Encounter (Signed)
PLEASE NOTE: All timestamps contained within this report are represented as [place] Standard Time. CONFIDENTIALTY NOTICE: This fax transmission is intended only for the addressee. It contains information that is legally privileged, confidential or otherwise protected from use or disclosure. If you are not the intended recipient, you are strictly prohibited from reviewing, disclosing, copying using or disseminating any of this information or taking any action in reliance on or regarding this information. If you have received this fax in error, please notify us immediately by telephone so that we can arrange for its return to [place]. Phone: [phone], Toll-Free: [phone], Fax: [phone] Page: 1 of 1 Call Id: [id number] [place] Patient Name: [name] DOB: 12-05-1940 Initial Comment Caller states thinks has problem w/RT ovary; fever Fri night and Sat; hurts to walk Nurse Assessment Nurse: Marcelline Deist, RN, Kermit Balo Date/Time (Eastern Time): 01/04/2015 12:59:27 PM Confirm and document reason for call. If symptomatic, describe symptoms. ---Caller states she thinks she thinks she has a problem with her right ovary. Continuous pain since Friday. Has had fever Fri. night and Sat., hurts to walk. No other symptoms. Has the patient traveled out of the country within the last 30 days? ---Not Applicable Does the patient require triage? ---Yes Related visit to physician within the last 2 weeks? ---No Does the PT have any chronic conditions? (i.e. diabetes, asthma, etc.) ---Yes List chronic conditions. ---back issues, aneurysm, others Guidelines Guideline Title Affirmed Question Affirmed Notes Abdominal Pain - Female [1] MILD-MODERATE pain AND [2] constant AND [3] present > 2 hours Final Disposition User See Physician within 4 Hours (or PCP triage) Marcelline Deist, RN, Lynda Comments Caller states she will  wait to be seen tomorrow. Scheduled with NP tomorrow, but advised to try to be seen sooner. If the office has a cancellation, please call her.

## 2015-01-04 NOTE — Telephone Encounter (Signed)
Please see below.

## 2015-01-04 NOTE — Progress Notes (Signed)
Patient ID: [name], female   DOB: Mar 02, 1941, 74 y.o.   MRN: [id number]   Patient Active Problem List   Diagnosis Date Noted  . Abdominal pain of unknown cause 01/05/2015  . Abnormal CT scan, colon 01/05/2015  . Coronary atherosclerosis 01/05/2015  . Aortic atherosclerosis 01/05/2015  . Screening for osteoporosis 08/22/2014  . Stress incontinence 04/25/2014  . 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 Murmur 12/31/2013  . Bradycardia 12/31/2013  . Nocturnal hypoxia 10/09/2013  . Shortness of breath 09/23/2013  . History of bacterial pneumonia 09/01/2013  . 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 History of tobacco abuse 09/01/2013  . Other and unspecified hyperlipidemia 03/28/2013  . Preoperative clearance 03/28/2013  . Routine general medical examination at a health care facility 03/27/2013  . OSA on CPAP 03/27/2013  . Cough 03/13/2013  . Insomnia due to anxiety and fear 03/13/2013  . Allergic rhinitis 03/13/2013  . Chronic nausea 12/02/2012  . Obesity (BMI 30-39.9) 03/10/2012  . Monoclonal gammopathy of undetermined significance 03/10/2012  . Anemia 01/01/2012  . Hypothyroidism 01/01/2012  . Osteoarthritis of right knee 07/12/2011  . Brain aneurysm 04/18/2011  . Chronic back pain greater than 3 months duration 04/18/2011  . Breast screening, unspecified 04/18/2011  . Hypertension 04/07/2011    Subjective:  CC:   Chief Complaint  Patient presents with  . Acute Visit    right flank pain fever.    HPI:   [name] is a 74 y.o. female who presents for   Abdominal pain,  Patient worked in urgently for evaluation due to 3 day history of right lower to mid  [place] abdominal pain described as deep and  stabbing , accompanied by fevers to 102.  Both have been present since Friday,    Bowels have been moving regularly. Denies diarrhea,  nausea,  dysuria,  And vaginal discharge.  Not sexually active.   Patient  insists that the pain is coming from her right ovary.  No history of kidney stones,  No report of hematuria . Takes oxycodone regularly for chronic back pain due to spinal stenosis ,    Past Medical History  Diagnosis Date  . Degenerative disc disease     HERNITAED DISC BY MRI ON 07/07;S/P LAMINECTOMY,RID REPLACEMENT  . Brain aneurysm     S/P RESECTION  . Discoid lupus erythematosus eyelid     NEGATIVE SEROLOGIC MARKERS  . Hypercholesterolemia   . Chronic sinusitis   . Hypertension   . Dysrhythmia     PVC's occ.  . Pneumonia 05/30/2010    after receiving Propofol  . Headache(784.0)     migraines occ.  . CHF (congestive heart failure) 05/2010    after receiving Propofol  . Hypothyroidism     takes Synthroid  . Blood transfusion 2009    back surgery  . Asthma 06/2010    depending on what around  . Complication of anesthesia 05/30/2010    from colonoscopy-had Propofol-had a  . Complication of anesthesia     reaction-went into asthma-pneumonia,-  . Complication of anesthesia     then CHF and to ICU  . Sleep apnea     uses CPAP-8.4-oxygen suppliment  . Renal insufficiency     Patient states she has been told she has Stage I kidney disease    Past Surgical History  Procedure Laterality Date  . Cosmetic surgery    . Tonsillectomy  1963  . Back surgery  1968,1988,2002,2009  4 laminectomies and fusions  . Abdominal hysterectomy  1988    uterus only  . Brain surgery  2005    coiling of cranial aneurysum  . Excision morton's neuroma  1990    left foot  . Nasal sinus surgery  1977-2008  . Lipomas  1975    3 from right breast  . Tympanic membrane repair  2009-last one    x5 times  . Total knee arthroplasty  07/10/2011    Procedure: TOTAL KNEE ARTHROPLASTY;  Surgeon: Gearlean Alf;  Location: [facility];  Service: Orthopedics;  Laterality: Right;  . Joint replacement  2013    Right knee,  [place]  . Eye surgery      both cataracts -[place] 7/14  . Foot arthrodesis Right  05/22/2013    Procedure: RIGHT HALLUX METATARSAL PHALANGEAL JOINT ARTHRODESIS ;  Surgeon: Wylene Simmer, MD;  Location: [facility];  Service: Orthopedics;  Laterality: Right;  . Right heart catheterization N/A 11/28/2013    Procedure: RIGHT HEART CATH;  Surgeon: Jolaine Artist, MD;  Location: [facility] CATH LAB;  Service: Cardiovascular;  Laterality: N/A;       The following portions of the patient's history were reviewed and updated as appropriate: Allergies, current medications, and problem list.    Review of Systems:   Patient denies headache, fevers, malaise, unintentional weight loss, skin rash, eye pain, sinus congestion and sinus pain, sore throat, dysphagia,  hemoptysis , cough, dyspnea, wheezing, chest pain, palpitations, orthopnea, edema, abdominal pain, nausea, melena, diarrhea, constipation, flank pain, dysuria, hematuria, urinary  Frequency, nocturia, numbness, tingling, seizures,  Focal weakness, Loss of consciousness,  Tremor, insomnia, depression, anxiety, and suicidal ideation.     History   Social History  . Marital Status: Married    Spouse Name: N/A  . Number of Children: N/A  . Years of Education: N/A   Occupational History  . Not on file.   Social History Main Topics  . Smoking status: Former Smoker -- 0.50 packs/day for 40 years    Types: Cigarettes    Quit date: 07/24/2013  . Smokeless tobacco: Never Used     Comment: REMOTELY QUIT  TOBACCO USE  . Alcohol Use: Yes     Comment: occasional  . Drug Use: No  . Sexual Activity: Not Currently   Other Topics Concern  . Not on file   Social History Narrative   Light exercise   LIVES WITH SPOUSE          Objective:  Filed Vitals:   01/04/15 1648  BP: 128/62  Pulse: 78  Temp: 98.3 F (36.8 C)  Resp: 14    General Appearance:    Alert, cooperative, no distress, appears stated age  Head:    Normocephalic, without obvious abnormality, atraumatic  Eyes:    PERRL, conjunctiva/corneas  clear, EOM's intact, fundi    benign, both eyes  Ears:    Normal TM's and external ear canals, both ears  Nose:   Nares normal, septum midline, mucosa normal, no drainage    or sinus tenderness  Throat:   Lips, mucosa, and tongue normal; teeth and gums normal  Neck:   Supple, symmetrical, trachea midline, no adenopathy;    thyroid:  no enlargement/tenderness/nodules; no carotid   bruit or JVD  Back:     Symmetric, no curvature, ROM normal, no CVA tenderness  Lungs:     Clear to auscultation bilaterally, respirations unlabored  Chest Wall:    No tenderness or deformity  Heart:    Regular rate and rhythm, S1 and S2 normal, no murmur, rub   or gallop  Breast Exam:    No tenderness, masses, or nipple abnormality  Abdomen:     Soft, tender without guarding or mass in the right lateral suprapubic area. Bowel sounds active.   no masses, no organomegaly  Genitalia:    Pelvic: s/p total hysterectomy, external genitalia normal, no adnexal masses or tenderness,, rectovaginal septum normal, uterus absent and vagina normal without discharge  Extremities:   Extremities normal, atraumatic, no cyanosis or edema  Pulses:   2+ and symmetric all extremities  Skin:   Skin color, texture, turgor normal, no rashes or lesions  Lymph nodes:   Cervical, supraclavicular, and axillary nodes normal  Neurologic:   CNII-XII intact, normal strength, sensation and reflexes    throughout    Assessment and Plan:  Abdominal pain of unknown cause Patient was sent for urgent CT ABD/PELVIS with contrast as she had no known history of appendectomy , she denies dysuria and pelvic exam was non revealing.  Report was called to MD at 9 PM and showed that patient is s/p appy,  Had dilated right ureter and a nonobstructive stone in the right kidney.  Right ovary was also considered normal,  but her ascending colon had a suspicious thickened wall concerning for neoplasm.  Last colonoscopy was 2008.  Urgent referral to General  Surgery for diagnostic colonoscopy and follow up   Recommended.   Urine and labs are pending,  Will treat only if Urine suggests infection since she denies dysuria and has not ha a fever in 24 hours.    Abnormal CT scan, colon By CT done Jan 04 2015 for RLQ pain and fevers. Neoplasm suspected per radiology,  Urgent referral to Gen Surgery    Coronary atherosclerosis By CT Nay 2016.  Fasting labs were normal in December  but given findings on CT,   Needs repeat lipids and statin therapy.    A total of 40 minutes was spent with patient more than half of which was spent in counseling patient on the above mentioned issues , reviewing and explaining recent labs and imaging studies done, and coordination of care.  Updated Medication List Outpatient Encounter Prescriptions as of 01/04/2015  Medication Sig  . AMBIEN 5 MG tablet TAKE 1 TABLET BY MOUTH AT BEDTIME  . Ascorbic Acid (VITAMIN C) 1000 MG tablet Take 500 mg by mouth daily.   . benzonatate (TESSALON) 100 MG capsule TAKE 1 CAPSULE (100 MG TOTAL) BY MOUTH 2 (TWO) TIMES DAILY.  Marland Kitchen CLARINEX 5 MG tablet TAKE 1 TABLET (5 MG TOTAL) BY MOUTH EVERY MORNING.  . CRESTOR 20 MG tablet TAKE 1 TABLET (20 MG TOTAL) BY MOUTH DAILY.  . cyanocobalamin (,VITAMIN B-12,) 1000 MCG/ML injection For use with monthly B12 injections  . diazepam (VALIUM) 5 MG tablet Take 1 tablet (5 mg total) by mouth 2 (two) times daily. As needed for anxiety  . doxepin (SINEQUAN) 25 MG capsule Take 2 capsules (50 mg total) by mouth at bedtime.  Marland Kitchen doxepin (SINEQUAN) 25 MG capsule TAKE 2 CAPSULES (50 MG TOTAL) BY MOUTH AT BEDTIME.  Marland Kitchen FeFum-FePo-FA-B Cmp-C-Zn-Mn-Cu (SE-TAN PLUS) 162-115.2-1 MG CAPS TAKE 1 CAPSULE BY MOUTH DAILY.  . isosorbide mononitrate (IMDUR) 60 MG 24 hr tablet TAKE 1 TABLET (60 MG TOTAL) BY MOUTH DAILY.  Marland Kitchen LEXAPRO 10 MG tablet TAKE 1 TABLET (10 MG TOTAL) BY MOUTH DAILY.  Marland Kitchen LIDODERM 5 % PLACE 3 PATCHES  ONTO SKIN DAILY AND DISCARD WITH 12 HOURS OR AS DIRECTED  .  losartan (COZAAR) 100 MG tablet Take 1 tablet (100 mg total) by mouth daily.  Marland Kitchen NEEDLE, DISP, 23 G (BD DISP NEEDLE) 23G X 1" MISC Uses to inject b12 into muscle once monthly  . NORVASC 10 MG tablet TAKE 1 TABLET (10 MG TOTAL) BY MOUTH DAILY.  Marland Kitchen omega-3 acid ethyl esters (LOVAZA) 1 G capsule Take 2 capsules (2 g total) by mouth 2 (two) times daily.  Marland Kitchen OVER THE COUNTER MEDICATION Take 1 capsule by mouth daily. Florify  . oxyCODONE-acetaminophen (PERCOCET) 10-325 MG per tablet Take 1 tablet by mouth every 8 (eight) hours as needed for pain.  Marland Kitchen SINGULAIR 10 MG tablet TAKE 1 TABLET BY MOUTH AT BEDTIME  . sodium chloride (OCEAN) 0.65 % nasal spray Place 2 sprays into the nose as needed. Allergies    . spironolactone (ALDACTONE) 25 MG tablet TAKE 1 TABLET EVERY DAY  . SYNTHROID 150 MCG tablet TAKE 1 TABLET (150 MCG TOTAL) BY MOUTH DAILY.  . SYNTHROID 150 MCG tablet TAKE 1 TABLET (150 MCG TOTAL) BY MOUTH DAILY.  Marland Kitchen Vitamin D, Ergocalciferol, (DRISDOL) 50000 UNITS CAPS capsule Take 1 capsule (50,000 Units total) by mouth every 7 (seven) days.  . phentermine (ADIPEX-P) 37.5 MG tablet 1/2 tablet once or twice daily for appetite suppression (Patient not taking: Reported on 01/04/2015)   Facility-Administered Encounter Medications as of 01/04/2015  Medication  . chlorhexidine (HIBICLENS) 4 % liquid 4 application     Orders Placed This Encounter  Procedures  . Urine Culture  . CT Abdomen Pelvis W Contrast  . Urinalysis, Routine w reflex microscopic  . CBC with Differential/Platelet  . Comprehensive metabolic panel  . C-reactive protein  . Sedimentation rate  . Lipid panel  . Ambulatory referral to General Surgery  . POCT Urinalysis Dipstick    No Follow-up on file.

## 2015-01-04 NOTE — Telephone Encounter (Signed)
You can double book the 4:00 sinus infection 4:30 minute  appt with ms Fiola.  Get labs and UA on her when she hits the door,

## 2015-01-05 ENCOUNTER — Telehealth: Payer: Self-pay | Admitting: Internal Medicine

## 2015-01-05 ENCOUNTER — Encounter: Payer: Self-pay | Admitting: Internal Medicine

## 2015-01-05 ENCOUNTER — Ambulatory Visit: Payer: Self-pay | Admitting: Nurse Practitioner

## 2015-01-05 DIAGNOSIS — R933 Abnormal findings on diagnostic imaging of other parts of digestive tract: Secondary | ICD-10-CM | POA: Insufficient documentation

## 2015-01-05 DIAGNOSIS — I2510 Atherosclerotic heart disease of native coronary artery without angina pectoris: Secondary | ICD-10-CM | POA: Insufficient documentation

## 2015-01-05 DIAGNOSIS — I700 Atherosclerosis of aorta: Secondary | ICD-10-CM | POA: Insufficient documentation

## 2015-01-05 DIAGNOSIS — R109 Unspecified abdominal pain: Secondary | ICD-10-CM | POA: Insufficient documentation

## 2015-01-05 LAB — COMPREHENSIVE METABOLIC PANEL
ALBUMIN: 3.9 g/dL (ref 3.5–5.2)
ALK PHOS: 75 U/L (ref 39–117)
ALT: 21 U/L (ref 0–35)
AST: 25 U/L (ref 0–37)
BUN: 24 mg/dL — ABNORMAL HIGH (ref 6–23)
CO2: 27 meq/L (ref 19–32)
CREATININE: 1.41 mg/dL — AB (ref 0.40–1.20)
Calcium: 10.5 mg/dL (ref 8.4–10.5)
Chloride: 103 mEq/L (ref 96–112)
GFR: 38.97 mL/min — ABNORMAL LOW (ref >60.00)
GLUCOSE: 91 mg/dL (ref 70–99)
Potassium: 4.5 mEq/L (ref 3.5–5.1)
SODIUM: 136 meq/L (ref 135–145)
Total Bilirubin: 0.6 mg/dL (ref 0.2–1.2)
Total Protein: 6.5 g/dL (ref 6.0–8.3)

## 2015-01-05 LAB — URINALYSIS, ROUTINE W REFLEX MICROSCOPIC
Bilirubin Urine: NEGATIVE
Hgb urine dipstick: NEGATIVE
Ketones, ur: NEGATIVE
NITRITE: NEGATIVE
RBC / HPF: NONE SEEN (ref 0–?)
SPECIFIC GRAVITY, URINE: 1.01 (ref 1.000–1.030)
Total Protein, Urine: NEGATIVE
URINE GLUCOSE: NEGATIVE
UROBILINOGEN UA: 0.2 (ref 0.0–1.0)
pH: 6 (ref 5.0–8.0)

## 2015-01-05 LAB — CBC WITH DIFFERENTIAL/PLATELET
BASOS PCT: 0.5 % (ref 0.0–3.0)
Basophils Absolute: 0.1 10*3/uL (ref 0.0–0.1)
EOS PCT: 2.2 % (ref 0.0–5.0)
Eosinophils Absolute: 0.3 10*3/uL (ref 0.0–0.7)
HEMATOCRIT: 37.4 % (ref 36.0–46.0)
HEMOGLOBIN: 12.6 g/dL (ref 12.0–15.0)
Lymphocytes Relative: 31.1 % (ref 12.0–46.0)
Lymphs Abs: 3.6 10*3/uL (ref 0.7–4.0)
MCHC: 33.7 g/dL (ref 30.0–36.0)
MCV: 94.1 fl (ref 78.0–100.0)
MONO ABS: 0.7 10*3/uL (ref 0.1–1.0)
Monocytes Relative: 6.5 % (ref 3.0–12.0)
NEUTROS ABS: 6.9 10*3/uL (ref 1.4–7.7)
NEUTROS PCT: 59.7 % (ref 43.0–77.0)
Platelets: 171 10*3/uL (ref 150.0–400.0)
RBC: 3.98 Mil/uL (ref 3.87–5.11)
RDW: 12.4 % (ref 11.5–15.5)
WBC: 11.5 10*3/uL — ABNORMAL HIGH (ref 4.0–10.5)

## 2015-01-05 LAB — C-REACTIVE PROTEIN: CRP: 2.9 mg/dL (ref 0.5–20.0)

## 2015-01-05 LAB — SEDIMENTATION RATE: Sed Rate: 58 mm/hr — ABNORMAL HIGH (ref 0–22)

## 2015-01-05 NOTE — Assessment & Plan Note (Signed)
By CT Nay 2016.  Fasting labs were normal in December  but given findings on CT,   Needs repeat lipids and statin therapy.

## 2015-01-05 NOTE — Telephone Encounter (Signed)
Notified patient and patient request Dr. Jamal Collin. Patient also scheduled for fasting lipids and CBC was added this morning to existing labs.

## 2015-01-05 NOTE — Telephone Encounter (Signed)
[name],  Please make the Gen Surg referral to Dr Jamal Collin, and mark it urgent.  She has a mass in her ascending colon per radiology

## 2015-01-05 NOTE — Assessment & Plan Note (Signed)
By CT done Jan 04 2015 for RLQ pain and fevers. Neoplasm suspected per radiology,  Urgent referral to Gen Surgery

## 2015-01-05 NOTE — Telephone Encounter (Signed)
[name]'s, CT scan suggested that she may have passed a kidney stone recentlyy since there was one in her right kidney and the ureter was dilated.  Her ovaries were fine and she has no appendix!!  BUT, THERE WAS AN ABNORMAL THICKENING of an area in the ascending colon and the radiologist is concerned that it might be a mass , so we  Need to schedule her ASP to see Dr Doreatha Lew, or another general surgeon of her choosing to get a colonoscopy.    Also,  The CBC I ordered last night did not get collected.  Can you ask [name] to add it to the labs drawn?

## 2015-01-05 NOTE — Assessment & Plan Note (Addendum)
Patient was sent for urgent CT ABD/PELVIS with contrast as she had no known history of appendectomy , she denies dysuria and pelvic exam was non revealing.  Report was called to MD at 9 PM and showed that patient is s/p appy,  Had dilated right ureter and a nonobstructive stone in the right kidney.  Right ovary was also considered normal,  but her ascending colon had a suspicious thickened wall concerning for neoplasm.  Last colonoscopy was 2008.  Urgent referral to General Surgery for diagnostic colonoscopy and follow up   Recommended.   Urine and labs are pending,  Will treat only if Urine suggests infection since she denies dysuria and has not ha a fever in 24 hours.

## 2015-01-05 NOTE — Telephone Encounter (Signed)
Left message for patient to call office. CBC has been added as advised.

## 2015-01-06 ENCOUNTER — Encounter: Payer: Self-pay | Admitting: General Surgery

## 2015-01-06 ENCOUNTER — Ambulatory Visit (INDEPENDENT_AMBULATORY_CARE_PROVIDER_SITE_OTHER): Payer: Federal, State, Local not specified - PPO | Admitting: General Surgery

## 2015-01-06 VITALS — BP 126/60 | HR 78 | Resp 16 | Ht 62.5 in | Wt 220.0 lb

## 2015-01-06 DIAGNOSIS — R1031 Right lower quadrant pain: Secondary | ICD-10-CM | POA: Diagnosis not present

## 2015-01-06 DIAGNOSIS — R935 Abnormal findings on diagnostic imaging of other abdominal regions, including retroperitoneum: Secondary | ICD-10-CM

## 2015-01-06 MED ORDER — POLYETHYLENE GLYCOL 3350 17 GM/SCOOP PO POWD: ORAL | Status: DC

## 2015-01-06 NOTE — Patient Instructions (Addendum)
Colonoscopy A colonoscopy is an exam to look at the entire large intestine (colon). This exam can help find problems such as tumors, polyps, inflammation, and areas of bleeding. The exam takes about 1 hour.  LET [facility] CARE PROVIDER KNOW ABOUT:   Any allergies you have.  All medicines you are taking, including vitamins, herbs, eye drops, creams, and over-the-counter medicines.  Previous problems you or members of your family have had with the use of anesthetics.  Any blood disorders you have.  Previous surgeries you have had.  Medical conditions you have. RISKS AND COMPLICATIONS  Generally, this is a safe procedure. However, as with any procedure, complications can occur. Possible complications include:  Bleeding.  Tearing or rupture of the colon wall.  Reaction to medicines given during the exam.  Infection (rare). BEFORE THE PROCEDURE   Ask your health care provider about changing or stopping your regular medicines.  You may be prescribed an oral bowel prep. This involves drinking a large amount of medicated liquid, starting the day before your procedure. The liquid will cause you to have multiple loose stools until your stool is almost clear or light green. This cleans out your colon in preparation for the procedure.  Do not eat or drink anything else once you have started the bowel prep, unless your health care provider tells you it is safe to do so.  Arrange for someone to drive you home after the procedure. PROCEDURE   You will be given medicine to help you relax (sedative).  You will lie on your side with your knees bent.  A long, flexible tube with a light and camera on the end (colonoscope) will be inserted through the rectum and into the colon. The camera sends video back to a computer screen as it moves through the colon. The colonoscope also releases carbon dioxide gas to inflate the colon. This helps your health care provider see the area better.  During  the exam, your health care provider may take a small tissue sample (biopsy) to be examined under a microscope if any abnormalities are found.  The exam is finished when the entire colon has been viewed. AFTER THE PROCEDURE   Do not drive for 24 hours after the exam.  You may have a small amount of blood in your stool.  You may pass moderate amounts of gas and have mild abdominal cramping or bloating. This is caused by the gas used to inflate your colon during the exam.  Ask when your test results will be ready and how you will get your results. Make sure you get your test results. Document Released: 08/04/2000 Document Revised: 05/28/2013 Document Reviewed: 04/14/2013 [facility] Patient Information 2015 [place], [place]. This information is not intended to replace advice given to you by your health care provider. Make sure you discuss any questions you have with your health care provider.  Patient has been scheduled for a colonoscopy on 01-13-15 at [facility]. This patient has been asked to discontinue fish oil one week prior to procedure.

## 2015-01-06 NOTE — Progress Notes (Signed)
Patient ID: [name], female   DOB: 09-30-1940, 74 y.o.   MRN: [id number]  Chief Complaint  Patient presents with  . Abdominal Pain    HPI [name] is a 74 y.o. female.  She states she has been having abdominal pain since last week. The pain was right lower abdomen. She did have fever over the weekend. She had a CT scan 01-04-15. Her pain is much better now.  Bowels have been regular and daily, minimal bleeding from hemorrhoid. Denies nausea or vomiting.  Grandmother had stomach cancer.  HPI  Past Medical History  Diagnosis Date  . Degenerative disc disease     HERNITAED DISC BY MRI ON 07/07;S/P LAMINECTOMY,RID REPLACEMENT  . Brain aneurysm     S/P RESECTION  . Discoid lupus erythematosus eyelid     NEGATIVE SEROLOGIC MARKERS  . Hypercholesterolemia   . Chronic sinusitis   . Hypertension   . Dysrhythmia     PVC's occ.  . Pneumonia 05/30/2010    after receiving Propofol  . Headache(784.0)     migraines occ.  . CHF (congestive heart failure) 05/2010    after receiving Propofol  . Hypothyroidism     takes Synthroid  . Blood transfusion 2009    back surgery  . Asthma 06/2010    depending on what around  . Complication of anesthesia 05/30/2010    from colonoscopy-had Propofol-had a  . Complication of anesthesia     reaction-went into asthma-pneumonia,-  . Complication of anesthesia     then CHF and to ICU  . Sleep apnea     uses CPAP-8.4-oxygen suppliment  . Renal insufficiency     Patient states she has been told she has Stage I kidney disease    Past Surgical History  Procedure Laterality Date  . Cosmetic surgery    . Tonsillectomy  1963  . Back surgery  1968,1988,2002,2009    4 laminectomies and fusions  . Abdominal hysterectomy  1988    uterus only  . Brain surgery  2005    coiling of cranial aneurysum  . Excision morton's neuroma  1990    left foot  . Nasal sinus surgery  1977-2008  . Lipomas  1975    3 from right breast  . Tympanic membrane  repair  2009-last one    x5 times  . Total knee arthroplasty  07/10/2011    Procedure: TOTAL KNEE ARTHROPLASTY;  Surgeon: Gearlean Alf;  Location: [facility];  Service: Orthopedics;  Laterality: Right;  . Joint replacement  2013    Right knee,  Alucio  . Eye surgery      both cataracts -[place] 7/14  . Foot arthrodesis Right 05/22/2013    Procedure: RIGHT HALLUX METATARSAL PHALANGEAL JOINT ARTHRODESIS ;  Surgeon: Wylene Simmer, MD;  Location: [place];  Service: Orthopedics;  Laterality: Right;  . Right heart catheterization N/A 11/28/2013    Procedure: RIGHT HEART CATH;  Surgeon: Jolaine Artist, MD;  Location: [facility] CATH LAB;  Service: Cardiovascular;  Laterality: N/A;  . Colonoscopy  2008    Family History  Problem Relation Age of Onset  . Heart disease Mother   . Stroke Mother   . Alcohol abuse Other   . Cancer Sister     leukemia  . Cancer Brother     lung Ca, asbestos    Social History History  Substance Use Topics  . Smoking status: Former Smoker -- 0.50 packs/day for 40 years  Types: Cigarettes    Quit date: 07/24/2013  . Smokeless tobacco: Never Used     Comment: REMOTELY QUIT  TOBACCO USE  . Alcohol Use: Yes     Comment: occasional    Allergies  Allergen Reactions  . Penicillins Anaphylaxis    Septra ANTI-INFECTIVE AGENTS-MISC..WELTS,ITCHING  . Propranolol     chf  . Adhesive [Tape] Rash  . Kenalog [Triamcinolone Acetonide] Itching and Rash  . Septra [Bactrim] Swelling and Rash    Current Outpatient Prescriptions  Medication Sig Dispense Refill  . AMBIEN 5 MG tablet TAKE 1 TABLET BY MOUTH AT BEDTIME 30 tablet 3  . Ascorbic Acid (VITAMIN C) 1000 MG tablet Take 500 mg by mouth daily.     . benzonatate (TESSALON) 100 MG capsule TAKE 1 CAPSULE (100 MG TOTAL) BY MOUTH 2 (TWO) TIMES DAILY. 60 capsule 1  . CLARINEX 5 MG tablet TAKE 1 TABLET (5 MG TOTAL) BY MOUTH EVERY MORNING. 90 tablet 1  . CRESTOR 20 MG tablet TAKE 1 TABLET (20 MG TOTAL) BY MOUTH  DAILY. 30 tablet 5  . cyanocobalamin (,VITAMIN B-12,) 1000 MCG/ML injection For use with monthly B12 injections 10 mL 2  . diazepam (VALIUM) 5 MG tablet Take 1 tablet (5 mg total) by mouth 2 (two) times daily. As needed for anxiety 180 tablet 0  . doxepin (SINEQUAN) 25 MG capsule Take 2 capsules (50 mg total) by mouth at bedtime. 30 capsule 5  . doxepin (SINEQUAN) 25 MG capsule TAKE 2 CAPSULES (50 MG TOTAL) BY MOUTH AT BEDTIME. 180 capsule 0  . FeFum-FePo-FA-B Cmp-C-Zn-Mn-Cu (SE-TAN PLUS) 162-115.2-1 MG CAPS TAKE 1 CAPSULE BY MOUTH DAILY. 30 capsule 5  . isosorbide mononitrate (IMDUR) 60 MG 24 hr tablet TAKE 1 TABLET (60 MG TOTAL) BY MOUTH DAILY. 90 tablet 2  . LEXAPRO 10 MG tablet TAKE 1 TABLET (10 MG TOTAL) BY MOUTH DAILY. 30 tablet 2  . LIDODERM 5 % PLACE 3 PATCHES ONTO SKIN DAILY AND DISCARD WITH 12 HOURS OR AS DIRECTED 90 patch 5  . losartan (COZAAR) 100 MG tablet Take 1 tablet (100 mg total) by mouth daily. 90 tablet 2  . NEEDLE, DISP, 23 G (BD DISP NEEDLE) 23G X 1" MISC Uses to inject b12 into muscle once monthly 12 each 1  . NORVASC 10 MG tablet TAKE 1 TABLET (10 MG TOTAL) BY MOUTH DAILY. 30 tablet 5  . omega-3 acid ethyl esters (LOVAZA) 1 G capsule Take 2 capsules (2 g total) by mouth 2 (two) times daily. 360 capsule 1  . OVER THE COUNTER MEDICATION Take 1 capsule by mouth daily. Florify    . oxyCODONE-acetaminophen (PERCOCET) 10-325 MG per tablet Take 1 tablet by mouth every 8 (eight) hours as needed for pain. 90 tablet 0  . SINGULAIR 10 MG tablet TAKE 1 TABLET BY MOUTH AT BEDTIME 30 tablet 11  . sodium chloride (OCEAN) 0.65 % nasal spray Place 2 sprays into the nose as needed. Allergies      . spironolactone (ALDACTONE) 25 MG tablet TAKE 1 TABLET EVERY DAY 30 tablet 5  . SYNTHROID 150 MCG tablet TAKE 1 TABLET (150 MCG TOTAL) BY MOUTH DAILY. 30 tablet 7  . Vitamin D, Ergocalciferol, (DRISDOL) 50000 UNITS CAPS capsule Take 1 capsule (50,000 Units total) by mouth every 7 (seven) days. 12  capsule 0  . polyethylene glycol powder (GLYCOLAX/MIRALAX) powder 255 grams one bottle for colonoscopy prep 255 g 0   No current facility-administered medications for this visit.   Facility-Administered Medications  Ordered in Other Visits  Medication Dose Route Frequency Provider Last Rate Last Dose  . chlorhexidine (HIBICLENS) 4 % liquid 4 application  60 mL Topical Once Gaynelle Arabian, MD        Review of Systems Review of Systems  Constitutional: Negative.   Respiratory: Negative.   Cardiovascular: Negative.   Gastrointestinal: Positive for abdominal pain. Negative for nausea, vomiting, diarrhea and constipation.    Blood pressure 126/60, pulse 78, resp. rate 16, height 5' 2.5" (1.588 m), weight 220 lb (99.791 kg).  Physical Exam Physical Exam  Constitutional: She is oriented to person, place, and time. She appears well-developed and well-nourished.  Eyes: Conjunctivae are normal. No scleral icterus.  Neck: Neck supple.  Cardiovascular: Normal rate, regular rhythm and normal heart sounds.   Pulmonary/Chest: Effort normal and breath sounds normal.  Abdominal: Soft. Normal appearance and bowel sounds are normal. There is tenderness in the right lower quadrant.  Lymphadenopathy:    She has no cervical adenopathy.  Neurological: She is alert and oriented to person, place, and time.  Skin: Skin is warm and dry.    Data Reviewed Office notes and CT scan reviewed. CT showed thickening in wall of ascending colon. Pt states her appendix was removed at time of hysterectomy but CT showed what looked like a normal appendix.  Assessment    Abnormal CT imaging of right colon. Colonoscopy indicated. Pt is agreeable.    Plan    Colonoscopy with possible biopsy/polypectomy prn: Information regarding the procedure, including its potential risks and complications (including but not limited to perforation of the bowel, which may require emergency surgery to repair, and bleeding) was  verbally given to the patient. Educational information regarding lower instestinal endoscopy was given to the patient. Written instructions for how to complete the bowel prep using Miralax were provided. The importance of drinking ample fluids to avoid dehydration as a result of the prep emphasized.    Patient has been scheduled for a colonoscopy on 01-13-15 at [facility]. This patient has been asked to discontinue fish oil one week prior to procedure.   PCP:  Deirdre Pippins 01/06/2015, 12:59 PM

## 2015-01-07 ENCOUNTER — Encounter: Payer: Self-pay | Admitting: Internal Medicine

## 2015-01-07 ENCOUNTER — Other Ambulatory Visit (INDEPENDENT_AMBULATORY_CARE_PROVIDER_SITE_OTHER): Payer: Federal, State, Local not specified - PPO

## 2015-01-07 ENCOUNTER — Other Ambulatory Visit: Payer: Self-pay | Admitting: Internal Medicine

## 2015-01-07 DIAGNOSIS — R5383 Other fatigue: Secondary | ICD-10-CM | POA: Diagnosis not present

## 2015-01-07 DIAGNOSIS — N183 Chronic kidney disease, stage 3 unspecified: Secondary | ICD-10-CM

## 2015-01-07 DIAGNOSIS — E785 Hyperlipidemia, unspecified: Secondary | ICD-10-CM | POA: Diagnosis not present

## 2015-01-07 LAB — COMPREHENSIVE METABOLIC PANEL
ALK PHOS: 75 U/L (ref 39–117)
ALT: 20 U/L (ref 0–35)
AST: 21 U/L (ref 0–37)
Albumin: 3.9 g/dL (ref 3.5–5.2)
BILIRUBIN TOTAL: 0.5 mg/dL (ref 0.2–1.2)
BUN: 26 mg/dL — AB (ref 6–23)
CO2: 27 meq/L (ref 19–32)
CREATININE: 1.53 mg/dL — AB (ref 0.40–1.20)
Calcium: 10.3 mg/dL (ref 8.4–10.5)
Chloride: 106 mEq/L (ref 96–112)
GFR: 35.47 mL/min — ABNORMAL LOW (ref >60.00)
Glucose, Bld: 95 mg/dL (ref 70–99)
Potassium: 4.5 mEq/L (ref 3.5–5.1)
Sodium: 138 mEq/L (ref 135–145)
Total Protein: 6.6 g/dL (ref 6.0–8.3)

## 2015-01-07 LAB — URINE CULTURE
COLONY COUNT: NO GROWTH
ORGANISM ID, BACTERIA: NO GROWTH

## 2015-01-07 LAB — LIPID PANEL
CHOLESTEROL: 137 mg/dL (ref 0–200)
HDL: 40.2 mg/dL (ref >39.00)
LDL Cholesterol: 62 mg/dL (ref 0–99)
NonHDL: 96.8
TRIGLYCERIDES: 176 mg/dL — AB (ref 0.0–149.0)
Total CHOL/HDL Ratio: 3
VLDL: 35.2 mg/dL (ref 0.0–40.0)

## 2015-01-10 ENCOUNTER — Other Ambulatory Visit: Payer: Self-pay | Admitting: Internal Medicine

## 2015-01-11 NOTE — Telephone Encounter (Signed)
Refill

## 2015-01-12 NOTE — Telephone Encounter (Signed)
Ok to refill,  Authorized in epic and sent  

## 2015-01-13 ENCOUNTER — Encounter
Admission: RE | Disposition: A | Discharge status: Home / Self Care | Payer: Self-pay | Source: Ambulatory Visit | Attending: General Surgery

## 2015-01-13 ENCOUNTER — Encounter: Payer: Self-pay | Admitting: *Deleted

## 2015-01-13 ENCOUNTER — Ambulatory Visit: Payer: Federal, State, Local not specified - PPO | Admitting: Anesthesiology

## 2015-01-13 ENCOUNTER — Ambulatory Visit
Admission: RE | Admit: 2015-01-13 | Discharge: 2015-01-13 | Disposition: A | Discharge status: Home / Self Care | Payer: Federal, State, Local not specified - PPO | Source: Ambulatory Visit | Attending: General Surgery | Admitting: General Surgery

## 2015-01-13 DIAGNOSIS — G4730 Sleep apnea, unspecified: Secondary | ICD-10-CM | POA: Insufficient documentation

## 2015-01-13 DIAGNOSIS — E780 Pure hypercholesterolemia: Secondary | ICD-10-CM | POA: Insufficient documentation

## 2015-01-13 DIAGNOSIS — R1031 Right lower quadrant pain: Secondary | ICD-10-CM | POA: Diagnosis not present

## 2015-01-13 DIAGNOSIS — J329 Chronic sinusitis, unspecified: Secondary | ICD-10-CM | POA: Diagnosis not present

## 2015-01-13 DIAGNOSIS — Z79891 Long term (current) use of opiate analgesic: Secondary | ICD-10-CM | POA: Diagnosis not present

## 2015-01-13 DIAGNOSIS — M5136 Other intervertebral disc degeneration, lumbar region: Secondary | ICD-10-CM | POA: Insufficient documentation

## 2015-01-13 DIAGNOSIS — Z9989 Dependence on other enabling machines and devices: Secondary | ICD-10-CM | POA: Insufficient documentation

## 2015-01-13 DIAGNOSIS — I509 Heart failure, unspecified: Secondary | ICD-10-CM | POA: Insufficient documentation

## 2015-01-13 DIAGNOSIS — Z79899 Other long term (current) drug therapy: Secondary | ICD-10-CM | POA: Diagnosis not present

## 2015-01-13 DIAGNOSIS — K633 Ulcer of intestine: Secondary | ICD-10-CM | POA: Insufficient documentation

## 2015-01-13 DIAGNOSIS — E039 Hypothyroidism, unspecified: Secondary | ICD-10-CM | POA: Insufficient documentation

## 2015-01-13 DIAGNOSIS — I10 Essential (primary) hypertension: Secondary | ICD-10-CM | POA: Insufficient documentation

## 2015-01-13 DIAGNOSIS — Z87891 Personal history of nicotine dependence: Secondary | ICD-10-CM | POA: Diagnosis not present

## 2015-01-13 DIAGNOSIS — N289 Disorder of kidney and ureter, unspecified: Secondary | ICD-10-CM | POA: Diagnosis not present

## 2015-01-13 DIAGNOSIS — R933 Abnormal findings on diagnostic imaging of other parts of digestive tract: Secondary | ICD-10-CM | POA: Diagnosis not present

## 2015-01-13 DIAGNOSIS — J45909 Unspecified asthma, uncomplicated: Secondary | ICD-10-CM | POA: Insufficient documentation

## 2015-01-13 HISTORY — PX: COLONOSCOPY WITH PROPOFOL: SHX5780

## 2015-01-13 SURGERY — COLONOSCOPY WITH PROPOFOL
Anesthesia: General

## 2015-01-13 MED ORDER — ETOMIDATE 2 MG/ML IV SOLN
INTRAVENOUS | Status: DC | PRN
  Administered 2015-01-13: 16 mg via INTRAVENOUS
  Administered 2015-01-13: 10 mg via INTRAVENOUS
  Administered 2015-01-13: 16 mg via INTRAVENOUS
  Administered 2015-01-13: 14 mg via INTRAVENOUS

## 2015-01-13 MED ORDER — MIDAZOLAM HCL 2 MG/2ML IJ SOLN
INTRAMUSCULAR | Status: DC | PRN
  Administered 2015-01-13: 1 mg via INTRAVENOUS

## 2015-01-13 MED ORDER — SODIUM CHLORIDE 0.9 % IV SOLN
INTRAVENOUS | Status: DC
  Administered 2015-01-13: 09:00:00 via INTRAVENOUS

## 2015-01-13 MED ORDER — FENTANYL CITRATE (PF) 100 MCG/2ML IJ SOLN
INTRAMUSCULAR | Status: DC | PRN
  Administered 2015-01-13: 50 ug via INTRAVENOUS

## 2015-01-13 NOTE — Transfer of Care (Signed)
Immediate Anesthesia Transfer of Care Note  Patient: [name]  Procedure(s) Performed: Procedure(s): COLONOSCOPY WITH PROPOFOL (N/A)  Patient Location: PACU and Endoscopy Unit  Anesthesia Type:General  Level of Consciousness: awake and alert   Airway & Oxygen Therapy: Patient Spontanous Breathing and Patient connected to nasal cannula oxygen  Post-op Assessment: Report given to RN and Post -op Vital signs reviewed and stable  Post vital signs: Reviewed and stable  Last Vitals:  Filed Vitals:   01/13/15 0900  BP: 107/56  Pulse: 72  Temp: 37.6 C  Resp: 20    Complications: No apparent anesthesia complications

## 2015-01-13 NOTE — Op Note (Signed)
[facility] Gastroenterology Patient Name: [name] Procedure Date: 01/13/2015 9:16 AM MRN: [id number] Account #: [id number] Date of Birth: 1940-11-01 Admit Type: Outpatient Age: 74 Room: [facility] ENDO ROOM 1 Gender: Female Note Status: Finalized Procedure:         Colonoscopy Indications:       Abdominal pain in the right lower quadrant, Abnormal CT of                     the GI tract Providers:         Orlie Pollen, MD Referring MD:      Deborra Medina, MD (Referring MD) Medicines:         General Anesthesia Complications:     No immediate complications. Procedure:         Pre-Anesthesia Assessment:                    - General anesthesia under the supervision of an                     anesthesiologist was determined to be medically necessary                     for this procedure based on review of the patient's                     medical history, medications, and prior anesthesia history.                    After obtaining informed consent, the colonoscope was                     passed under direct vision. Throughout the procedure, the                     patient's blood pressure, pulse, and oxygen saturations                     were monitored continuously. The Olympus PCF-H180AL                     colonoscope ( S#: [id number] ) was introduced through the                     anus and advanced to the the terminal ileum, with                     identification of the ileocecal valve. The colonoscopy was                     performed without difficulty. The patient tolerated the                     procedure well. The quality of the bowel preparation was                     good. Findings:      The perianal and digital rectal examinations were normal.      Discontinuous areas of ulcerated mucosa were present in the cecum.       Biopsies were taken with a cold forceps for histology. Verification of       patient identification for the specimen was done.  Estimated blood loss:       none.      The exam  was otherwise without abnormality. Impression:        - Mucosal ulceration. Biopsied.                    - The examination was otherwise normal. Recommendation:    - Await pathology results.                    - Return to endoscopist at appointment to be scheduled. Procedure Code(s): --- Professional ---                    [phone], Colonoscopy, flexible; with biopsy, single or                     multiple Diagnosis Code(s): --- Professional ---                    K63.3, Ulcer of intestine                    R10.31, Right lower quadrant pain                    R93.3, Abnormal findings on diagnostic imaging of other                     parts of digestive tract CPT copyright 2014 American Medical Association. All rights reserved. The codes documented in this report are preliminary and upon coder review may  be revised to meet current compliance requirements. Orlie Pollen, MD 01/13/2015 10:17:15 AM This report has been signed electronically. Number of Addenda: 0 Note Initiated On: 01/13/2015 9:16 AM Scope Withdrawal Time: 0 hours 9 minutes 12 seconds  Total Procedure Duration: 0 hours 34 minutes 4 seconds       [facility]

## 2015-01-13 NOTE — Anesthesia Postprocedure Evaluation (Signed)
  Anesthesia Post-op Note  Patient: [name]  Procedure(s) Performed: Procedure(s): COLONOSCOPY WITH PROPOFOL (N/A)  Anesthesia type:General  Patient location: PACU  Post pain: Pain level controlled  Post assessment: Post-op Vital signs reviewed, Patient's Cardiovascular Status Stable, Respiratory Function Stable, Patent Airway and No signs of Nausea or vomiting  Post vital signs: Reviewed and stable  Last Vitals:  Filed Vitals:   01/13/15 1020  BP: 97/49  Pulse: 73  Temp: 36.3 C  Resp: 14    Level of consciousness: awake, alert  and patient cooperative  Complications: No apparent anesthesia complications

## 2015-01-13 NOTE — Interval H&P Note (Signed)
History and Physical Interval Note:  01/13/2015 9:21 AM  [name]  has presented today for surgery, with the diagnosis of ABNORMAL CT OF ABD, RLQ ABDOMINAL PAIN  The various methods of treatment have been discussed with the patient and family. After consideration of risks, benefits and other options for treatment, the patient has consented to  Procedure(s): COLONOSCOPY WITH PROPOFOL (N/A) as a surgical intervention .  The patient's history has been reviewed, patient examined, no change in status, stable for surgery.  I have reviewed the patient's chart and labs.  Questions were answered to the patient's satisfaction.     Drury Ardizzone G

## 2015-01-13 NOTE — H&P (View-Only) (Signed)
Patient ID: [name], female   DOB: Jul 17, 1941, 74 y.o.   MRN: [id number]  Chief Complaint  Patient presents with  . Abdominal Pain    HPI [name] is a 74 y.o. female.  She states she has been having abdominal pain since last week. The pain was right lower abdomen. She did have fever over the weekend. She had a CT scan 01-04-15. Her pain is much better now.  Bowels have been regular and daily, minimal bleeding from hemorrhoid. Denies nausea or vomiting.  Grandmother had stomach cancer.  HPI  Past Medical History  Diagnosis Date  . Degenerative disc disease     HERNITAED DISC BY MRI ON 07/07;S/P LAMINECTOMY,RID REPLACEMENT  . Brain aneurysm     S/P RESECTION  . Discoid lupus erythematosus eyelid     NEGATIVE SEROLOGIC MARKERS  . Hypercholesterolemia   . Chronic sinusitis   . Hypertension   . Dysrhythmia     PVC's occ.  . Pneumonia 05/30/2010    after receiving Propofol  . Headache(784.0)     migraines occ.  . CHF (congestive heart failure) 05/2010    after receiving Propofol  . Hypothyroidism     takes Synthroid  . Blood transfusion 2009    back surgery  . Asthma 06/2010    depending on what around  . Complication of anesthesia 05/30/2010    from colonoscopy-had Propofol-had a  . Complication of anesthesia     reaction-went into asthma-pneumonia,-  . Complication of anesthesia     then CHF and to ICU  . Sleep apnea     uses CPAP-8.4-oxygen suppliment  . Renal insufficiency     Patient states she has been told she has Stage I kidney disease    Past Surgical History  Procedure Laterality Date  . Cosmetic surgery    . Tonsillectomy  1963  . Back surgery  1968,1988,2002,2009    4 laminectomies and fusions  . Abdominal hysterectomy  1988    uterus only  . Brain surgery  2005    coiling of cranial aneurysum  . Excision morton's neuroma  1990    left foot  . Nasal sinus surgery  1977-2008  . Lipomas  1975    3 from right breast  . Tympanic membrane  repair  2009-last one    x5 times  . Total knee arthroplasty  07/10/2011    Procedure: TOTAL KNEE ARTHROPLASTY;  Surgeon: Gearlean Alf;  Location: [facility];  Service: Orthopedics;  Laterality: Right;  . Joint replacement  2013    Right knee,  Alucio  . Eye surgery      both cataracts -[place] 7/14  . Foot arthrodesis Right 05/22/2013    Procedure: RIGHT HALLUX METATARSAL PHALANGEAL JOINT ARTHRODESIS ;  Surgeon: Wylene Simmer, MD;  Location: [place];  Service: Orthopedics;  Laterality: Right;  . Right heart catheterization N/A 11/28/2013    Procedure: RIGHT HEART CATH;  Surgeon: Jolaine Artist, MD;  Location: [facility] CATH LAB;  Service: Cardiovascular;  Laterality: N/A;  . Colonoscopy  2008    Family History  Problem Relation Age of Onset  . Heart disease Mother   . Stroke Mother   . Alcohol abuse Other   . Cancer Sister     leukemia  . Cancer Brother     lung Ca, asbestos    Social History History  Substance Use Topics  . Smoking status: Former Smoker -- 0.50 packs/day for 40 years  Types: Cigarettes    Quit date: 07/24/2013  . Smokeless tobacco: Never Used     Comment: REMOTELY QUIT  TOBACCO USE  . Alcohol Use: Yes     Comment: occasional    Allergies  Allergen Reactions  . Penicillins Anaphylaxis    Septra ANTI-INFECTIVE AGENTS-MISC..WELTS,ITCHING  . Propranolol     chf  . Adhesive [Tape] Rash  . Kenalog [Triamcinolone Acetonide] Itching and Rash  . Septra [Bactrim] Swelling and Rash    Current Outpatient Prescriptions  Medication Sig Dispense Refill  . AMBIEN 5 MG tablet TAKE 1 TABLET BY MOUTH AT BEDTIME 30 tablet 3  . Ascorbic Acid (VITAMIN C) 1000 MG tablet Take 500 mg by mouth daily.     . benzonatate (TESSALON) 100 MG capsule TAKE 1 CAPSULE (100 MG TOTAL) BY MOUTH 2 (TWO) TIMES DAILY. 60 capsule 1  . CLARINEX 5 MG tablet TAKE 1 TABLET (5 MG TOTAL) BY MOUTH EVERY MORNING. 90 tablet 1  . CRESTOR 20 MG tablet TAKE 1 TABLET (20 MG TOTAL) BY MOUTH  DAILY. 30 tablet 5  . cyanocobalamin (,VITAMIN B-12,) 1000 MCG/ML injection For use with monthly B12 injections 10 mL 2  . diazepam (VALIUM) 5 MG tablet Take 1 tablet (5 mg total) by mouth 2 (two) times daily. As needed for anxiety 180 tablet 0  . doxepin (SINEQUAN) 25 MG capsule Take 2 capsules (50 mg total) by mouth at bedtime. 30 capsule 5  . doxepin (SINEQUAN) 25 MG capsule TAKE 2 CAPSULES (50 MG TOTAL) BY MOUTH AT BEDTIME. 180 capsule 0  . FeFum-FePo-FA-B Cmp-C-Zn-Mn-Cu (SE-TAN PLUS) 162-115.2-1 MG CAPS TAKE 1 CAPSULE BY MOUTH DAILY. 30 capsule 5  . isosorbide mononitrate (IMDUR) 60 MG 24 hr tablet TAKE 1 TABLET (60 MG TOTAL) BY MOUTH DAILY. 90 tablet 2  . LEXAPRO 10 MG tablet TAKE 1 TABLET (10 MG TOTAL) BY MOUTH DAILY. 30 tablet 2  . LIDODERM 5 % PLACE 3 PATCHES ONTO SKIN DAILY AND DISCARD WITH 12 HOURS OR AS DIRECTED 90 patch 5  . losartan (COZAAR) 100 MG tablet Take 1 tablet (100 mg total) by mouth daily. 90 tablet 2  . NEEDLE, DISP, 23 G (BD DISP NEEDLE) 23G X 1" MISC Uses to inject b12 into muscle once monthly 12 each 1  . NORVASC 10 MG tablet TAKE 1 TABLET (10 MG TOTAL) BY MOUTH DAILY. 30 tablet 5  . omega-3 acid ethyl esters (LOVAZA) 1 G capsule Take 2 capsules (2 g total) by mouth 2 (two) times daily. 360 capsule 1  . OVER THE COUNTER MEDICATION Take 1 capsule by mouth daily. Florify    . oxyCODONE-acetaminophen (PERCOCET) 10-325 MG per tablet Take 1 tablet by mouth every 8 (eight) hours as needed for pain. 90 tablet 0  . SINGULAIR 10 MG tablet TAKE 1 TABLET BY MOUTH AT BEDTIME 30 tablet 11  . sodium chloride (OCEAN) 0.65 % nasal spray Place 2 sprays into the nose as needed. Allergies      . spironolactone (ALDACTONE) 25 MG tablet TAKE 1 TABLET EVERY DAY 30 tablet 5  . SYNTHROID 150 MCG tablet TAKE 1 TABLET (150 MCG TOTAL) BY MOUTH DAILY. 30 tablet 7  . Vitamin D, Ergocalciferol, (DRISDOL) 50000 UNITS CAPS capsule Take 1 capsule (50,000 Units total) by mouth every 7 (seven) days. 12  capsule 0  . polyethylene glycol powder (GLYCOLAX/MIRALAX) powder 255 grams one bottle for colonoscopy prep 255 g 0   No current facility-administered medications for this visit.   Facility-Administered Medications  Ordered in Other Visits  Medication Dose Route Frequency Provider Last Rate Last Dose  . chlorhexidine (HIBICLENS) 4 % liquid 4 application  60 mL Topical Once Gaynelle Arabian, MD        Review of Systems Review of Systems  Constitutional: Negative.   Respiratory: Negative.   Cardiovascular: Negative.   Gastrointestinal: Positive for abdominal pain. Negative for nausea, vomiting, diarrhea and constipation.    Blood pressure 126/60, pulse 78, resp. rate 16, height 5' 2.5" (1.588 m), weight 220 lb (99.791 kg).  Physical Exam Physical Exam  Constitutional: She is oriented to person, place, and time. She appears well-developed and well-nourished.  Eyes: Conjunctivae are normal. No scleral icterus.  Neck: Neck supple.  Cardiovascular: Normal rate, regular rhythm and normal heart sounds.   Pulmonary/Chest: Effort normal and breath sounds normal.  Abdominal: Soft. Normal appearance and bowel sounds are normal. There is tenderness in the right lower quadrant.  Lymphadenopathy:    She has no cervical adenopathy.  Neurological: She is alert and oriented to person, place, and time.  Skin: Skin is warm and dry.    Data Reviewed Office notes and CT scan reviewed. CT showed thickening in wall of ascending colon. Pt states her appendix was removed at time of hysterectomy but CT showed what looked like a normal appendix.  Assessment    Abnormal CT imaging of right colon. Colonoscopy indicated. Pt is agreeable.    Plan    Colonoscopy with possible biopsy/polypectomy prn: Information regarding the procedure, including its potential risks and complications (including but not limited to perforation of the bowel, which may require emergency surgery to repair, and bleeding) was  verbally given to the patient. Educational information regarding lower instestinal endoscopy was given to the patient. Written instructions for how to complete the bowel prep using Miralax were provided. The importance of drinking ample fluids to avoid dehydration as a result of the prep emphasized.    Patient has been scheduled for a colonoscopy on 01-13-15 at [facility]. This patient has been asked to discontinue fish oil one week prior to procedure.   PCP:  Deirdre Pippins 01/06/2015, 12:59 PM

## 2015-01-13 NOTE — Anesthesia Preprocedure Evaluation (Signed)
Anesthesia Evaluation    History of Anesthesia Complications (+) history of anesthetic complications (? reaction to propofol)  Airway Mallampati: II  TM Distance: >3 FB Neck ROM: Full    Dental  (+) Partial Upper   Pulmonary asthma , sleep apnea, Continuous Positive Airway Pressure Ventilation and Oxygen sleep apnea , pneumonia -, resolved,          Cardiovascular hypertension, Pt. on medications + CAD and +CHF (history after propofol dosing)     Neuro/Psych  Headaches (none in a long time),    GI/Hepatic   Endo/Other  Hypothyroidism   Renal/GU Renal disease     Musculoskeletal   Abdominal   Peds  Hematology  (+) anemia ,   Anesthesia Other Findings   Reproductive/Obstetrics                             Anesthesia Physical Anesthesia Plan  ASA: III  Anesthesia Plan: General   Post-op Pain Management:    Induction: Intravenous  Airway Management Planned: Nasal Cannula  Additional Equipment:   Intra-op Plan:   Post-operative Plan:   Informed Consent: I have reviewed the patients History and Physical, chart, labs and discussed the procedure including the risks, benefits and alternatives for the proposed anesthesia with the patient or authorized representative who has indicated his/her understanding and acceptance.     Plan Discussed with:   Anesthesia Plan Comments:         Anesthesia Quick Evaluation

## 2015-01-13 NOTE — Anesthesia Procedure Notes (Signed)
Performed by: Jonna Clark Pre-anesthesia Checklist: Patient identified, Emergency Drugs available, Suction available, Patient being monitored and Timeout performed Patient Re-evaluated:Patient Re-evaluated prior to inductionOxygen Delivery Method: Nasal cannula Intubation Type: IV induction

## 2015-01-14 ENCOUNTER — Encounter: Payer: Self-pay | Admitting: General Surgery

## 2015-01-15 ENCOUNTER — Telehealth: Payer: Self-pay | Admitting: General Surgery

## 2015-01-15 LAB — SURGICAL PATHOLOGY

## 2015-01-15 NOTE — Telephone Encounter (Signed)
Pt returned my call.  She was advised on path report from biopsy of ulcer in cecum. Appears to ischemic in nature. 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 Pt ok with seeing Dr. Lucky Cowboy for this. I talked with Dr. Lucky Cowboy and his office will call pt for an appointment.  I need to see pt about 3-4 weeks after her angiography

## 2015-01-19 ENCOUNTER — Telehealth: Payer: Self-pay | Admitting: Internal Medicine

## 2015-01-19 NOTE — Telephone Encounter (Signed)
Pt said that she will be having an Angiogram and stent put in on 01/25/15.Marland Kitchen Pt request on rx oxyCODONE-acetaminophen (PERCOCET) 10-325 MG.[name]

## 2015-01-20 MED ORDER — OXYCODONE-ACETAMINOPHEN 10-325 MG PO TABS: 1.0000 | ORAL_TABLET | Freq: Three times a day (TID) | ORAL | Status: DC | PRN

## 2015-01-20 NOTE — Telephone Encounter (Signed)
Ok to refill,  printed rx  

## 2015-01-20 NOTE — Telephone Encounter (Signed)
Please advise Ok to fill last fill 12/21/14.

## 2015-01-20 NOTE — Telephone Encounter (Signed)
Patient notified and script placed at front desk. 

## 2015-01-21 NOTE — Telephone Encounter (Signed)
Mailed to pt

## 2015-01-25 ENCOUNTER — Ambulatory Visit: Payer: Medicare Other | Admitting: Internal Medicine

## 2015-01-25 ENCOUNTER — Encounter
Admission: RE | Disposition: A | Discharge status: Home / Self Care | Payer: Self-pay | Source: Ambulatory Visit | Attending: Vascular Surgery

## 2015-01-25 ENCOUNTER — Other Ambulatory Visit: Payer: Self-pay | Admitting: Internal Medicine

## 2015-01-25 ENCOUNTER — Encounter: Payer: Self-pay | Admitting: *Deleted

## 2015-01-25 ENCOUNTER — Ambulatory Visit
Admission: RE | Admit: 2015-01-25 | Discharge: 2015-01-25 | Disposition: A | Discharge status: Home / Self Care | Payer: Federal, State, Local not specified - PPO | Source: Ambulatory Visit | Attending: Vascular Surgery | Admitting: Vascular Surgery

## 2015-01-25 DIAGNOSIS — Z79899 Other long term (current) drug therapy: Secondary | ICD-10-CM | POA: Diagnosis not present

## 2015-01-25 DIAGNOSIS — K633 Ulcer of intestine: Secondary | ICD-10-CM | POA: Diagnosis not present

## 2015-01-25 DIAGNOSIS — R935 Abnormal findings on diagnostic imaging of other abdominal regions, including retroperitoneum: Secondary | ICD-10-CM

## 2015-01-25 DIAGNOSIS — K551 Chronic vascular disorders of intestine: Secondary | ICD-10-CM

## 2015-01-25 DIAGNOSIS — R1031 Right lower quadrant pain: Secondary | ICD-10-CM

## 2015-01-25 DIAGNOSIS — Z87891 Personal history of nicotine dependence: Secondary | ICD-10-CM | POA: Insufficient documentation

## 2015-01-25 DIAGNOSIS — I771 Stricture of artery: Secondary | ICD-10-CM

## 2015-01-25 HISTORY — PX: PERIPHERAL VASCULAR CATHETERIZATION: SHX172C

## 2015-01-25 LAB — CREATININE, SERUM
CREATININE: 1.48 mg/dL — AB (ref 0.44–1.00)
GFR calc Af Amer: 40 mL/min — ABNORMAL LOW (ref >60)
GFR calc non Af Amer: 34 mL/min — ABNORMAL LOW (ref >60)

## 2015-01-25 LAB — BUN: BUN: 34 mg/dL — ABNORMAL HIGH (ref 6–20)

## 2015-01-25 SURGERY — VISCERAL ANGIOGRAPHY
Anesthesia: Moderate Sedation

## 2015-01-25 MED ORDER — FENTANYL CITRATE (PF) 100 MCG/2ML IJ SOLN
INTRAMUSCULAR | Status: AC
  Filled 2015-01-25: qty 2

## 2015-01-25 MED ORDER — HYDROMORPHONE HCL 1 MG/ML IJ SOLN: 0.5000 mg | INTRAMUSCULAR | Status: DC | PRN

## 2015-01-25 MED ORDER — OXYCODONE-ACETAMINOPHEN 5-325 MG PO TABS: 1.0000 | ORAL_TABLET | ORAL | Status: DC | PRN

## 2015-01-25 MED ORDER — ACETAMINOPHEN 325 MG RE SUPP
325.0000 mg | RECTAL | Status: DC | PRN
  Filled 2015-01-25: qty 2

## 2015-01-25 MED ORDER — CEFAZOLIN SODIUM 1-5 GM-% IV SOLN
INTRAVENOUS | Status: AC
  Filled 2015-01-25: qty 50

## 2015-01-25 MED ORDER — HEPARIN SODIUM (PORCINE) 1000 UNIT/ML IJ SOLN
INTRAMUSCULAR | Status: AC
  Filled 2015-01-25: qty 1

## 2015-01-25 MED ORDER — FENTANYL CITRATE (PF) 100 MCG/2ML IJ SOLN
INTRAMUSCULAR | Status: DC | PRN
  Administered 2015-01-25 (×3): 100 ug via INTRAVENOUS

## 2015-01-25 MED ORDER — SODIUM CHLORIDE 0.9 % IV SOLN
INTRAVENOUS | Status: DC
  Administered 2015-01-25: 11:00:00 via INTRAVENOUS

## 2015-01-25 MED ORDER — ONDANSETRON HCL 4 MG/2ML IJ SOLN: 4.0000 mg | Freq: Four times a day (QID) | INTRAMUSCULAR | Status: DC | PRN

## 2015-01-25 MED ORDER — IOHEXOL 300 MG/ML  SOLN
INTRAMUSCULAR | Status: DC | PRN
  Administered 2015-01-25: 55 mL via INTRAVENOUS

## 2015-01-25 MED ORDER — CLONIDINE HCL 0.1 MG PO TABS: 0.2000 mg | ORAL_TABLET | ORAL | Status: DC | PRN

## 2015-01-25 MED ORDER — HYDROMORPHONE HCL 1 MG/ML IJ SOLN: 1.0000 mg | INTRAMUSCULAR | Status: DC | PRN

## 2015-01-25 MED ORDER — ACETAMINOPHEN 325 MG PO TABS
325.0000 mg | ORAL_TABLET | ORAL | Status: DC | PRN
Start: 2015-01-25 — End: 2015-01-27

## 2015-01-25 MED ORDER — ONDANSETRON HCL 4 MG/2ML IJ SOLN
4.0000 mg | INTRAMUSCULAR | Status: DC | PRN
Start: 2015-01-25 — End: 2015-01-27

## 2015-01-25 MED ORDER — CLINDAMYCIN PHOSPHATE 300 MG/50ML IV SOLN
INTRAVENOUS | Status: AC
Start: 2015-01-25 — End: 2015-01-25
  Administered 2015-01-25: 12:00:00
  Filled 2015-01-25: qty 50

## 2015-01-25 MED ORDER — MIDAZOLAM HCL 5 MG/5ML IJ SOLN
INTRAMUSCULAR | Status: AC
  Filled 2015-01-25: qty 5

## 2015-01-25 MED ORDER — LACTATED RINGERS IV SOLN: INTRAVENOUS | Status: DC

## 2015-01-25 MED ORDER — SODIUM BICARBONATE 8.4 % IV SOLN
INTRAVENOUS | Status: AC
  Filled 2015-01-25: qty 500

## 2015-01-25 MED ORDER — HEPARIN SODIUM (PORCINE) 1000 UNIT/ML IJ SOLN
INTRAMUSCULAR | Status: DC | PRN
  Administered 2015-01-25 (×2): 2000 [IU] via INTRAVENOUS

## 2015-01-25 MED ORDER — MIDAZOLAM HCL 2 MG/2ML IJ SOLN
INTRAMUSCULAR | Status: AC
  Filled 2015-01-25: qty 2

## 2015-01-25 MED ORDER — MIDAZOLAM HCL 2 MG/2ML IJ SOLN
INTRAMUSCULAR | Status: DC | PRN
  Administered 2015-01-25: 3 mg via INTRAVENOUS
  Administered 2015-01-25: 4 mg via INTRAVENOUS

## 2015-01-25 MED ORDER — GUAIFENESIN-DM 100-10 MG/5ML PO SYRP
15.0000 mL | ORAL_SOLUTION | ORAL | Status: DC | PRN
  Filled 2015-01-25: qty 15

## 2015-01-25 MED ORDER — SODIUM BICARBONATE BOLUS VIA INFUSION
INTRAVENOUS | Status: AC
  Administered 2015-01-25: 12:00:00 via INTRAVENOUS
  Filled 2015-01-25: qty 1

## 2015-01-25 MED ORDER — ATROPINE SULFATE 0.1 MG/ML IJ SOLN: 0.5000 mg | INTRAMUSCULAR | Status: DC | PRN

## 2015-01-25 MED ORDER — SODIUM CHLORIDE 0.9 % IJ SOLN
INTRAMUSCULAR | Status: AC
  Filled 2015-01-25: qty 15

## 2015-01-25 MED ORDER — LIDOCAINE-EPINEPHRINE (PF) 1 %-1:200000 IJ SOLN
INTRAMUSCULAR | Status: AC
  Filled 2015-01-25: qty 30

## 2015-01-25 MED ORDER — SODIUM CHLORIDE 0.9 % IV SOLN: 500.0000 mL | Freq: Once | INTRAVENOUS | Status: AC | PRN

## 2015-01-25 MED ORDER — HEPARIN (PORCINE) IN NACL 2-0.9 UNIT/ML-% IJ SOLN
INTRAMUSCULAR | Status: AC
  Filled 2015-01-25: qty 1000

## 2015-01-25 SURGICAL SUPPLY — 15 items
BAG DECANTER STRL (MISCELLANEOUS) ×3 IMPLANT
CATH BEACON 5 .035 65 C2 TIP (CATHETERS) ×3 IMPLANT
CATH LIMA 6F (CATHETERS) ×3 IMPLANT
CATH ROYAL FLUSH PIG 5F 70CM (CATHETERS) ×3 IMPLANT
DEVICE PRESTO INFLATION (MISCELLANEOUS) ×3 IMPLANT
DEVICE STARCLOSE SE CLOSURE (Vascular Products) ×3 IMPLANT
GLIDECATH 4FR STR (CATHETERS) ×3 IMPLANT
GLIDEWIRE STIFF .35X180X3 HYDR (WIRE) ×3 IMPLANT
PACK ANGIOGRAPHY (CUSTOM PROCEDURE TRAY) ×3 IMPLANT
SHEATH BRITE TIP 5FRX11 (SHEATH) ×3 IMPLANT
SHEATH BRITE TIP 6FRX11 (SHEATH) ×3 IMPLANT
STENT HERCULINK RX 6.5X18X135 (Permanent Stent) ×3 IMPLANT
SYR MEDRAD MARK V 150ML (SYRINGE) ×3 IMPLANT
WIRE J 3MM .035X145CM (WIRE) ×3 IMPLANT
WIRE SPARTACORE .014X190CM (WIRE) ×3 IMPLANT

## 2015-01-25 NOTE — CV Procedure (Signed)
[facility]  Percutaneous Study/Intervention Procedural Note   Date of Surgery: 01/25/2015 Surgeon(s):Aurthur Wingerter  Assistants:none Pre-operative Diagnosis: SMA stenosis, ischemic cecal ulcer Post-operative diagnosis:  Same  Procedure(s) Performed:  1.  Ultrasound guidance for vascular access right femoral artery  2.  Catheter placement into SMA from right femoral approach  3.  Aortogram and selective SMA angiogram  4.  Balloon expandable stent placement to the superior mesenteric artery using a 6.5 mm diameter x 18 mm length Herculink balloon expandable stent  5.  StarClose closure device right femoral artery    Indications:  Patient is referred from general surgery for evaluation for an ischemic cecal ulceration. She has undergone colonoscopy confirming this. CT scan demonstrates a significant SMA lesion and she is referred for further evaluation and treatment. Angiogram is performed to evaluate the lesion more thoroughly and potentially allow treatment.  Procedure:  The patient was identified and appropriate procedural time out was performed.  The patient was then placed supine on the table and prepped and draped in the usual sterile fashion.  Ultrasound was used to evaluate the right common femoral artery.  It was patent .  A digital ultrasound image was acquired.  A Seldinger needle was used to access the right common femoral artery under direct ultrasound guidance and a permanent image was performed.  A 0.035 J wire was advanced without resistance and a 5Fr sheath was placed.  Pigtail catheter was then placed into the aorta and initially an AP aortogram was performed which showed a low-lying origin of the left renal artery but no stenosis in either renal artery or the aorta. Hardware from her back surgeries made visualization a little difficult. Lateral projection view was performed which showed typical orientations of the celiac and SMA. Calcific lesion was seen in the  SMA and I exchanged for a C2 catheter for further evaluation of the SMA. Selective imaging of the superior mesenteric artery demonstrated about an 80-85% stenosis just beyond the origin. The patient was then given 4000 units of intravenous heparin and I exchanged for a 6 French sheath and a 6 [place] IM guide catheter to perform treatment. Across the lesion without difficulty with a Glidewire and then exchanged for a 0.014 wire. I then selected a 6.5 mm diameter by 18 mm length balloon expandable stent and deployed this across the stenosis at the origin of the superior mesenteric artery just back into the aorta in excellent location. The balloon was inflated to 12 atm. Less than 10% residual stenosis was seen on completion angiogram and I elected to terminate the procedure. The guide catheter was removed and oblique arteriogram was performed of the right femoral artery. StarClose closure device was deployed in the usual fashion with excellent hemostatic result.  Findings:   Aortogram:  Aorta patent without significant stenosis or aneurysm, renal arteries appeared widely patent  SMA:  Approximately 85% stenosis just beyond the origin, resolved with less than 10% residual stenosis after stent placement      Disposition: Patient was taken to the recovery room in stable condition having tolerated the procedure well.  Mariaceleste Herrera, MD 01/25/2015 12:48 PM

## 2015-01-25 NOTE — Telephone Encounter (Signed)
Ok refill? 

## 2015-01-25 NOTE — Discharge Instructions (Signed)

## 2015-01-25 NOTE — H&P (Signed)
[facility] History & Physical Update  The patient was interviewed and re-examined.  The patient's previous History and Physical has been reviewed and is unchanged.  There is no change in the plan of care.  Rebecca Motta, MD  01/25/2015, 10:19 AM

## 2015-01-26 ENCOUNTER — Encounter: Payer: Self-pay | Admitting: Vascular Surgery

## 2015-02-01 ENCOUNTER — Ambulatory Visit (INDEPENDENT_AMBULATORY_CARE_PROVIDER_SITE_OTHER): Payer: Federal, State, Local not specified - PPO | Admitting: Internal Medicine

## 2015-02-01 ENCOUNTER — Telehealth: Payer: Self-pay | Admitting: Internal Medicine

## 2015-02-01 ENCOUNTER — Encounter: Payer: Self-pay | Admitting: Internal Medicine

## 2015-02-01 VITALS — BP 104/60 | HR 72 | Temp 98.6°F | Resp 14 | Ht 63.0 in | Wt 224.0 lb

## 2015-02-01 DIAGNOSIS — R300 Dysuria: Secondary | ICD-10-CM | POA: Diagnosis not present

## 2015-02-01 DIAGNOSIS — R933 Abnormal findings on diagnostic imaging of other parts of digestive tract: Secondary | ICD-10-CM

## 2015-02-01 DIAGNOSIS — I700 Atherosclerosis of aorta: Secondary | ICD-10-CM | POA: Diagnosis not present

## 2015-02-01 DIAGNOSIS — N3090 Cystitis, unspecified without hematuria: Secondary | ICD-10-CM

## 2015-02-01 DIAGNOSIS — I2510 Atherosclerotic heart disease of native coronary artery without angina pectoris: Secondary | ICD-10-CM

## 2015-02-01 LAB — POCT URINALYSIS DIPSTICK
GLUCOSE UA: 100
Nitrite, UA: POSITIVE
Protein, UA: 100
SPEC GRAV UA: 1.01
Urobilinogen, UA: 2
pH, UA: 5.5

## 2015-02-01 MED ORDER — CIPROFLOXACIN HCL 250 MG PO TABS: 250.0000 mg | ORAL_TABLET | Freq: Two times a day (BID) | ORAL | Status: DC

## 2015-02-01 NOTE — Telephone Encounter (Signed)
Pt is scheduled for 6:30pm however she is wanting to have something sent to the pharmacy before then for the pain.  She started taking Azo yesterday.  Please advise

## 2015-02-01 NOTE — Progress Notes (Signed)
Pre-visit discussion using our clinic review tool. No additional management support is needed unless otherwise documented below in the visit note.  

## 2015-02-01 NOTE — Telephone Encounter (Signed)
PATINET DOESN'T NEED TO WAIT TIL 6:30.  OVERBVOOK EARLY AFTERNOON  FOR uti

## 2015-02-01 NOTE — Progress Notes (Signed)
Subjective:  Patient ID: [name], female    DOB: [birth year]  Age: 74 y.o. MRN: [id number]  CC: The primary encounter diagnosis was Aortic atherosclerosis. Diagnoses of Dysuria, Atherosclerosis of native coronary artery of native heart without angina pectoris, Cystitis, and Abnormal CT scan, colon were also pertinent to this visit.  HPI [name] presents for burning with urination and frequency,  And was worked in for this reason,. The symptoms have  been constant for the past 24 hours .  Started after church on Sunday.  Denies any recent travel or prolonged periods without voiding,  No swimming or hot tub use,  Not sexually active currently.  She does wonder if the symptoms are related to having an iv contrasted study on Monday,  5 days prior to the onset of symptoms. .  2) patient complains of ankles swelling . Had a laser procedure on the right leg by Dr Hulda Humphrey and thinks he did it on the wrong leg.   3) Patient requesting the results of the CT angiogram that was done last week by Dr. Lucky Cowboy.  ,  on July 6    4) Patient requesting discussion of atherosclerosis noted in the aorta as seen on recent on CT.   She has a mediterranean style diet and takes crestor  Outpatient Prescriptions Prior to Visit  Medication Sig Dispense Refill  . AMBIEN 5 MG tablet TAKE 1 TABLET BY MOUTH AT BEDTIME 30 tablet 3  . amLODipine (NORVASC) 10 MG tablet Take 10 mg by mouth daily.    . Ascorbic Acid (VITAMIN C) 1000 MG tablet Take 500 mg by mouth daily.     . Ascorbic Acid (VITAMIN C) 1000 MG tablet Take 1,000 mg by mouth daily.    . benzonatate (TESSALON) 100 MG capsule TAKE 1 CAPSULE (100 MG TOTAL) BY MOUTH 2 (TWO) TIMES DAILY. 60 capsule 1  . CLARINEX 5 MG tablet TAKE 1 TABLET (5 MG TOTAL) BY MOUTH EVERY MORNING. 90 tablet 1  . Coenzyme Q10 100 MG TABS Take 1 tablet by mouth daily.    . CRESTOR 20 MG tablet TAKE 1 TABLET (20 MG TOTAL) BY MOUTH DAILY. 30 tablet 5  . cyanocobalamin (,VITAMIN B-12,) 1000  MCG/ML injection For use with monthly B12 injections 10 mL 2  . desloratadine (CLARINEX) 5 MG tablet Take 5 mg by mouth daily.    . diazepam (VALIUM) 5 MG tablet Take 1 tablet (5 mg total) by mouth 2 (two) times daily. As needed for anxiety 180 tablet 0  . doxepin (SINEQUAN) 25 MG capsule Take 2 capsules (50 mg total) by mouth at bedtime. 30 capsule 5  . ergocalciferol (VITAMIN D2) 50000 UNITS capsule Take 50,000 Units by mouth once a week. Wednesday    . escitalopram (LEXAPRO) 10 MG tablet Take 10 mg by mouth daily.    Marland Kitchen FeFum-FePo-FA-B Cmp-C-Zn-Mn-Cu (SE-TAN PLUS) 162-115.2-1 MG CAPS TAKE 1 CAPSULE BY MOUTH DAILY. 30 capsule 5  . ibuprofen (ADVIL,MOTRIN) 200 MG tablet Take 200 mg by mouth every 6 (six) hours as needed for moderate pain.    . isosorbide mononitrate (IMDUR) 60 MG 24 hr tablet TAKE 1 TABLET (60 MG TOTAL) BY MOUTH DAILY. 90 tablet 2  . levothyroxine (SYNTHROID, LEVOTHROID) 150 MCG tablet Take 150 mcg by mouth daily before breakfast.    . lidocaine (LIDODERM) 5 % Place 3 patches onto the skin daily. Remove & Discard patch within 12 hours or as directed by MD    . losartan (  COZAAR) 100 MG tablet Take 1 tablet (100 mg total) by mouth daily. 90 tablet 2  . montelukast (SINGULAIR) 10 MG tablet Take 10 mg by mouth at bedtime.    Marland Kitchen NEEDLE, DISP, 23 G (BD DISP NEEDLE) 23G X 1" MISC Uses to inject b12 into muscle once monthly 12 each 1  . omega-3 acid ethyl esters (LOVAZA) 1 G capsule Take 2 capsules (2 g total) by mouth 2 (two) times daily. 360 capsule 1  . OVER THE COUNTER MEDICATION Take 1 capsule by mouth daily. Florify    . oxyCODONE-acetaminophen (PERCOCET) 10-325 MG per tablet Take 1 tablet by mouth every 8 (eight) hours as needed for pain. 90 tablet 0  . Prenatal Vit-Fe Fumarate-FA (PRENATAL 1 PLUS 1 PO) Take 1 tablet by mouth daily.    Marland Kitchen pyridOXINE (VITAMIN B-6) 100 MG tablet Take 100 mg by mouth daily.    . sodium chloride (OCEAN) 0.65 % nasal spray Place 2 sprays into the nose as  needed. Allergies      . spironolactone (ALDACTONE) 25 MG tablet TAKE 1 TABLET EVERY DAY 30 tablet 5  . SYNTHROID 150 MCG tablet TAKE 1 TABLET (150 MCG TOTAL) BY MOUTH DAILY. 30 tablet 7  . zolpidem (AMBIEN) 5 MG tablet Take 5 mg by mouth at bedtime as needed for sleep.     Facility-Administered Medications Prior to Visit  Medication Dose Route Frequency Provider Last Rate Last Dose  . chlorhexidine (HIBICLENS) 4 % liquid 4 application  60 mL Topical Once Gaynelle Arabian, MD        Review of Systems;  Patient denies headache, fevers, malaise, unintentional weight loss, skin rash, eye pain, sinus congestion and sinus pain, sore throat, dysphagia,  hemoptysis , cough, dyspnea, wheezing, chest pain, palpitations, orthopnea, edema, abdominal pain, nausea, melena, diarrhea, constipation, flank pain, dysuria, hematuria, urinary  Frequency, nocturia, numbness, tingling, seizures,  Focal weakness, Loss of consciousness,  Tremor, insomnia, depression, anxiety, and suicidal ideation.      Objective:  BP 104/60 mmHg  Pulse 72  Temp(Src) 98.6 F (37 C) (Oral)  Resp 14  Ht 5\' 3"  (1.6 m)  Wt 224 lb (101.606 kg)  BMI 39.69 kg/m2  SpO2 95%  BP Readings from Last 3 Encounters:  02/01/15 104/60  01/25/15 93/51  01/13/15 116/74    Wt Readings from Last 3 Encounters:  02/01/15 224 lb (101.606 kg)  01/25/15 190 lb (86.183 kg)  01/13/15 195 lb (88.451 kg)    General appearance: alert, cooperative and appears stated age Ears: normal TM's and external ear canals both ears Throat: lips, mucosa, and tongue normal; teeth and gums normal Neck: no adenopathy, no carotid bruit, supple, symmetrical, trachea midline and thyroid not enlarged, symmetric, no tenderness/mass/nodules Back: symmetric, no curvature. ROM normal. No CVA tenderness. Lungs: clear to auscultation bilaterally Heart: regular rate and rhythm, S1, S2 normal, no murmur, click, rub or gallop Abdomen: soft, non-tender; bowel sounds  normal; no masses,  no organomegaly Pulses: 2+ and symmetric Skin: Skin color, texture, turgor normal. No rashes or lesions Lymph nodes: Cervical, supraclavicular, and axillary nodes normal.  Lab Results  Component Value Date   HGBA1C 4.5* 04/04/2013   HGBA1C 5.8 04/18/2011    Lab Results  Component Value Date   CREATININE 1.48* 01/25/2015   CREATININE 1.53* 01/07/2015   CREATININE 1.41* 01/04/2015    Lab Results  Component Value Date   WBC 11.5* 01/04/2015   HGB 12.6 01/04/2015   HCT 37.4 01/04/2015   PLT 171.0  01/04/2015   GLUCOSE 95 01/07/2015   CHOL 137 01/07/2015   TRIG 176.0* 01/07/2015   HDL 40.20 01/07/2015   LDLDIRECT 64.7 01/01/2012   LDLCALC 62 01/07/2015   ALT 20 01/07/2015   AST 21 01/07/2015   NA 138 01/07/2015   K 4.5 01/07/2015   CL 106 01/07/2015   CREATININE 1.48* 01/25/2015   BUN 34* 01/25/2015   CO2 27 01/07/2015   TSH 0.79 08/20/2014   INR 1.03 11/28/2013   HGBA1C 4.5* 04/04/2013   MICROALBUR 1.1 04/04/2013    No results found.  Assessment & Plan:   Problem List Items Addressed This Visit    Abnormal CT scan, colon    She underwent recent diagnostic colonoscopy which failed to find a mass.       Coronary atherosclerosis    Again, asymptomatic, noted on CT.  Continue crestor,  Add asa.        Aortic atherosclerosis - Primary    With 85% stenosis of SMA by June 6 ct angiogram.  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  Less than 15% stenosis post procedure (Dew). Lipids are well controlled on current statin therapy. Advised to start taking a baby aspirin daily if she can tolerate it.    Lab Results  Component Value Date   CHOL 137 01/07/2015   HDL 40.20 01/07/2015   LDLCALC 62 01/07/2015   LDLDIRECT 64.7 01/01/2012   TRIG 176.0* 01/07/2015   CHOLHDL 3 01/07/2015   Lab Results  Component Value Date   ALT 20 01/07/2015   AST 21 01/07/2015   ALKPHOS 75 01/07/2015   BILITOT 0.5 01/07/2015         Cystitis    Empiric  treatment with Cipro pending urine culture.  Probiotic advised.        Other Visit Diagnoses    Dysuria        Relevant Orders    POCT urinalysis dipstick (Completed)      A total of 25 minutes of face to face time was spent with patient more than half of which was spent in counselling about the above mentioned conditions  and coordination of care   I am having [name] start on ciprofloxacin. I am also having her maintain her sodium chloride, NEEDLE (DISP) 23 G, doxepin, vitamin C, OVER THE COUNTER MEDICATION, cyanocobalamin, losartan, omega-3 acid ethyl esters, spironolactone, SE-TAN PLUS, CRESTOR, isosorbide mononitrate, AMBIEN, diazepam, CLARINEX, SYNTHROID, oxyCODONE-acetaminophen, benzonatate, amLODipine, vitamin C, desloratadine, escitalopram, Prenatal Vit-Fe Fumarate-FA (PRENATAL 1 PLUS 1 PO), levothyroxine, lidocaine, montelukast, zolpidem, pyridOXINE, Coenzyme Q10, ergocalciferol, and ibuprofen.  Meds ordered this encounter  Medications  . ciprofloxacin (CIPRO) 250 MG tablet    Sig: Take 1 tablet (250 mg total) by mouth 2 (two) times daily.    Dispense:  10 tablet    Refill:  0    There are no discontinued medications.  Follow-up: No Follow-up on file.   Crecencio Mc, MD

## 2015-02-01 NOTE — Telephone Encounter (Signed)
Pt called stating poss uti.[name] [phone] [phone]

## 2015-02-01 NOTE — Patient Instructions (Addendum)
Try the WASA crackers instead of ritz crackers: lower carb, higher fiber.  Your urine suggests you have a UTI,  I will treat you with Cipro for 5 days  Please continue  Your  probiotic  while you are on the antibiotic to prevent a serious antibiotic associated diarrhea  Called clostirudium dificile colitis and a vaginal yeast infection

## 2015-02-02 ENCOUNTER — Encounter: Payer: Self-pay | Admitting: Internal Medicine

## 2015-02-02 DIAGNOSIS — N3090 Cystitis, unspecified without hematuria: Secondary | ICD-10-CM | POA: Insufficient documentation

## 2015-02-02 NOTE — Assessment & Plan Note (Addendum)
With 85% stenosis of SMA by June 6 ct angiogram.  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  Less than 15% stenosis post procedure (Dew). Lipids are well controlled on current statin therapy. Advised to start taking a baby aspirin daily if she can tolerate it.    Lab Results  Component Value Date   CHOL 137 01/07/2015   HDL 40.20 01/07/2015   LDLCALC 62 01/07/2015   LDLDIRECT 64.7 01/01/2012   TRIG 176.0* 01/07/2015   CHOLHDL 3 01/07/2015   Lab Results  Component Value Date   ALT 20 01/07/2015   AST 21 01/07/2015   ALKPHOS 75 01/07/2015   BILITOT 0.5 01/07/2015

## 2015-02-02 NOTE — Assessment & Plan Note (Signed)
Empiric treatment with Cipro pending urine culture.  Probiotic advised.

## 2015-02-02 NOTE — Assessment & Plan Note (Signed)
She underwent recent diagnostic colonoscopy which failed to find a mass.

## 2015-02-02 NOTE — Assessment & Plan Note (Signed)
Again, asymptomatic, noted on CT.  Continue crestor,  Add asa.

## 2015-02-03 ENCOUNTER — Encounter: Payer: Self-pay | Admitting: Internal Medicine

## 2015-02-03 ENCOUNTER — Other Ambulatory Visit: Payer: Self-pay | Admitting: Internal Medicine

## 2015-02-03 LAB — URINE CULTURE

## 2015-02-03 MED ORDER — NITROFURANTOIN MONOHYD MACRO 100 MG PO CAPS: 100.0000 mg | ORAL_CAPSULE | Freq: Two times a day (BID) | ORAL | Status: DC

## 2015-02-04 ENCOUNTER — Other Ambulatory Visit: Payer: Self-pay | Admitting: Internal Medicine

## 2015-02-04 ENCOUNTER — Telehealth: Payer: Self-pay

## 2015-02-04 NOTE — Telephone Encounter (Signed)
Please disregard msg.  Patient has read mychart message and will pick up the new antibiotic

## 2015-02-04 NOTE — Telephone Encounter (Signed)
The pt called and stated her urinary symptoms are not better.  She is hoping to get advice on how to help.  She currently taking azo, drinking cranberry juice, and taking cipro.   Pt callback - [phone]

## 2015-02-11 ENCOUNTER — Telehealth: Payer: Self-pay | Admitting: *Deleted

## 2015-02-11 ENCOUNTER — Ambulatory Visit: Payer: Medicare Other | Admitting: Internal Medicine

## 2015-02-11 NOTE — Telephone Encounter (Signed)
Needs to have urine rechecked. I would recommend she keep appointment tomorrow with Dr. Derrel Nip. If symptoms severe, then urgent care tonight.

## 2015-02-11 NOTE — Telephone Encounter (Signed)
Pt called states she is experiencing burning and pain upon urination.  States she just completed Cipro for UTI.  Pt has appoint tomorrow at 3 pm however she is wanting something called in today.  Please advise in Dr Lupita Dawn absence.

## 2015-02-11 NOTE — Telephone Encounter (Signed)
Spoke with pt, advised of MDs message.  Pt verbalized understanding 

## 2015-02-12 ENCOUNTER — Encounter: Payer: Self-pay | Admitting: Internal Medicine

## 2015-02-12 ENCOUNTER — Ambulatory Visit (INDEPENDENT_AMBULATORY_CARE_PROVIDER_SITE_OTHER): Payer: Federal, State, Local not specified - PPO | Admitting: Internal Medicine

## 2015-02-12 VITALS — BP 120/67 | HR 72 | Temp 98.6°F | Resp 14 | Ht 63.0 in | Wt 219.8 lb

## 2015-02-12 DIAGNOSIS — E038 Other specified hypothyroidism: Secondary | ICD-10-CM | POA: Diagnosis not present

## 2015-02-12 DIAGNOSIS — E034 Atrophy of thyroid (acquired): Secondary | ICD-10-CM

## 2015-02-12 DIAGNOSIS — N3090 Cystitis, unspecified without hematuria: Secondary | ICD-10-CM

## 2015-02-12 DIAGNOSIS — R300 Dysuria: Secondary | ICD-10-CM

## 2015-02-12 DIAGNOSIS — I2510 Atherosclerotic heart disease of native coronary artery without angina pectoris: Secondary | ICD-10-CM

## 2015-02-12 DIAGNOSIS — N183 Chronic kidney disease, stage 3 unspecified: Secondary | ICD-10-CM

## 2015-02-12 DIAGNOSIS — E785 Hyperlipidemia, unspecified: Secondary | ICD-10-CM

## 2015-02-12 LAB — POCT URINALYSIS DIPSTICK
BILIRUBIN UA: NEGATIVE
Glucose, UA: NEGATIVE
Ketones, UA: NEGATIVE
NITRITE UA: POSITIVE
PH UA: 6.5
Protein, UA: NEGATIVE
SPEC GRAV UA: 1.01
Urobilinogen, UA: 0.2

## 2015-02-12 MED ORDER — PHENAZOPYRIDINE HCL 200 MG PO TABS: 200.0000 mg | ORAL_TABLET | Freq: Three times a day (TID) | ORAL | Status: DC | PRN

## 2015-02-12 MED ORDER — FLUCONAZOLE 150 MG PO TABS: 150.0000 mg | ORAL_TABLET | Freq: Every day | ORAL | Status: DC

## 2015-02-12 MED ORDER — KEFLEX 500 MG PO CAPS: 500.0000 mg | ORAL_CAPSULE | Freq: Four times a day (QID) | ORAL | Status: DC

## 2015-02-12 NOTE — Patient Instructions (Addendum)
WASA crackers   Continue your soy milk  Stop   the spironolactone. IT MAY BE CAUSING YOUR CR TO GO UP  Starting Keflex 4 times daily pending new culture results  Diflucan  For two days   Pyridium as needed for urethral pain

## 2015-02-12 NOTE — Progress Notes (Signed)
Subjective:  Patient ID: [name], female    DOB: November 17, 1940  Age: 74 y.o. MRN: [id number]  CC: The primary encounter diagnosis was Dysuria. Diagnoses of CKD (chronic kidney disease) stage 3, GFR 30-59 ml/min, Hyperlipidemia, Hypothyroidism due to acquired atrophy of thyroid, and Cystitis were also pertinent to this visit.  HPI [name] presents for follow up on persistent dysuria,  patient was treated on 6/13 for UTi with empiric cipro.  Culture came back E coli resistant to cipro and e mail was sent to change to nitrofurantoin , which she did.  Last dose was yesterday,  Urethral meatus is still very raw and she is quite uncomfortable. She has not had any diarrhea, or nausea.. Outpatient Prescriptions Prior to Visit  Medication Sig Dispense Refill  . AMBIEN 5 MG tablet TAKE 1 TABLET BY MOUTH AT BEDTIME 30 tablet 3  . amLODipine (NORVASC) 10 MG tablet Take 10 mg by mouth daily.    . Ascorbic Acid (VITAMIN C) 1000 MG tablet Take 500 mg by mouth daily.     . benzonatate (TESSALON) 100 MG capsule TAKE 1 CAPSULE (100 MG TOTAL) BY MOUTH 2 (TWO) TIMES DAILY. 60 capsule 1  . CLARINEX 5 MG tablet TAKE 1 TABLET (5 MG TOTAL) BY MOUTH EVERY MORNING. 90 tablet 1  . Coenzyme Q10 100 MG TABS Take 1 tablet by mouth daily.    . CRESTOR 20 MG tablet TAKE 1 TABLET (20 MG TOTAL) BY MOUTH DAILY. 30 tablet 5  . cyanocobalamin (,VITAMIN B-12,) 1000 MCG/ML injection For use with monthly B12 injections 10 mL 2  . doxepin (SINEQUAN) 25 MG capsule Take 2 capsules (50 mg total) by mouth at bedtime. 30 capsule 5  . ergocalciferol (VITAMIN D2) 50000 UNITS capsule Take 50,000 Units by mouth once a week. Wednesday    . escitalopram (LEXAPRO) 10 MG tablet Take 10 mg by mouth daily.    Marland Kitchen FeFum-FePo-FA-B Cmp-C-Zn-Mn-Cu (SE-TAN PLUS) 162-115.2-1 MG CAPS TAKE 1 CAPSULE BY MOUTH DAILY. 30 capsule 5  . isosorbide mononitrate (IMDUR) 60 MG 24 hr tablet TAKE 1 TABLET (60 MG TOTAL) BY MOUTH DAILY. 90 tablet 2  . levothyroxine  (SYNTHROID, LEVOTHROID) 150 MCG tablet Take 150 mcg by mouth daily before breakfast.    . lidocaine (LIDODERM) 5 % Place 3 patches onto the skin daily. Remove & Discard patch within 12 hours or as directed by MD    . losartan (COZAAR) 100 MG tablet Take 1 tablet (100 mg total) by mouth daily. 90 tablet 2  . LOVAZA 1 G capsule TAKE 2 CAPSULES (2 G TOTAL) BY MOUTH 2 (TWO) TIMES DAILY. 360 capsule 1  . montelukast (SINGULAIR) 10 MG tablet Take 10 mg by mouth at bedtime.    Marland Kitchen NEEDLE, DISP, 23 G (BD DISP NEEDLE) 23G X 1" MISC Uses to inject b12 into muscle once monthly 12 each 1  . OVER THE COUNTER MEDICATION Take 1 capsule by mouth daily. Florify    . oxyCODONE-acetaminophen (PERCOCET) 10-325 MG per tablet Take 1 tablet by mouth every 8 (eight) hours as needed for pain. 90 tablet 0  . Prenatal Vit-Fe Fumarate-FA (PRENATAL 1 PLUS 1 PO) Take 1 tablet by mouth daily.    Marland Kitchen pyridOXINE (VITAMIN B-6) 100 MG tablet Take 100 mg by mouth daily.    . sodium chloride (OCEAN) 0.65 % nasal spray Place 2 sprays into the nose as needed. Allergies      . SYNTHROID 150 MCG tablet TAKE 1 TABLET (150  MCG TOTAL) BY MOUTH DAILY. 30 tablet 7  . desloratadine (CLARINEX) 5 MG tablet Take 5 mg by mouth daily.    . diazepam (VALIUM) 5 MG tablet Take 1 tablet (5 mg total) by mouth 2 (two) times daily. As needed for anxiety 180 tablet 0  . spironolactone (ALDACTONE) 25 MG tablet TAKE 1 TABLET EVERY DAY 30 tablet 5  . ibuprofen (ADVIL,MOTRIN) 200 MG tablet Take 200 mg by mouth every 6 (six) hours as needed for moderate pain.    . Ascorbic Acid (VITAMIN C) 1000 MG tablet Take 1,000 mg by mouth daily.    . ciprofloxacin (CIPRO) 250 MG tablet Take 1 tablet (250 mg total) by mouth 2 (two) times daily. (Patient not taking: Reported on 02/12/2015) 10 tablet 0  . nitrofurantoin, macrocrystal-monohydrate, (MACROBID) 100 MG capsule Take 1 capsule (100 mg total) by mouth 2 (two) times daily. 14 capsule 0  . zolpidem (AMBIEN) 5 MG tablet  Take 5 mg by mouth at bedtime as needed for sleep.     Facility-Administered Medications Prior to Visit  Medication Dose Route Frequency Provider Last Rate Last Dose  . chlorhexidine (HIBICLENS) 4 % liquid 4 application  60 mL Topical Once Gaynelle Arabian, MD        Review of Systems;  Patient denies headache, fevers, malaise, unintentional weight loss, skin rash, eye pain, sinus congestion and sinus pain, sore throat, dysphagia,  hemoptysis , cough, dyspnea, wheezing, chest pain, palpitations, orthopnea, edema, abdominal pain, nausea, melena, diarrhea, constipation, flank pain, dysuria, hematuria, urinary  Frequency, nocturia, numbness, tingling, seizures,  Focal weakness, Loss of consciousness,  Tremor, insomnia, depression, anxiety, and suicidal ideation.      Objective:  BP 120/67 mmHg  Pulse 72  Temp(Src) 98.6 F (37 C) (Oral)  Resp 14  Ht 5\' 3"  (1.6 m)  Wt 219 lb 12 oz (99.678 kg)  BMI 38.94 kg/m2  SpO2 96%  BP Readings from Last 3 Encounters:  02/12/15 120/67  02/01/15 104/60  01/25/15 93/51    Wt Readings from Last 3 Encounters:  02/12/15 219 lb 12 oz (99.678 kg)  02/01/15 224 lb (101.606 kg)  01/25/15 190 lb (86.183 kg)    General appearance: alert, cooperative and appears stated age Ears: normal TM's and external ear canals both ears Throat: lips, mucosa, and tongue normal; teeth and gums normal Neck: no adenopathy, no carotid bruit, supple, symmetrical, trachea midline and thyroid not enlarged, symmetric, no tenderness/mass/nodules Back: symmetric, no curvature. ROM normal. No CVA tenderness. Lungs: clear to auscultation bilaterally Heart: regular rate and rhythm, S1, S2 normal, no murmur, click, rub or gallop Abdomen: soft, non-tender; bowel sounds normal; no masses,  no organomegaly Pulses: 2+ and symmetric Skin: Skin color, texture, turgor normal. No rashes or lesions Lymph nodes: Cervical, supraclavicular, and axillary nodes normal.  Lab Results    Component Value Date   HGBA1C 4.5* 04/04/2013   HGBA1C 5.8 04/18/2011    Lab Results  Component Value Date   CREATININE 1.48* 01/25/2015   CREATININE 1.53* 01/07/2015   CREATININE 1.41* 01/04/2015    Lab Results  Component Value Date   WBC 11.5* 01/04/2015   HGB 12.6 01/04/2015   HCT 37.4 01/04/2015   PLT 171.0 01/04/2015   GLUCOSE 95 01/07/2015   CHOL 137 01/07/2015   TRIG 176.0* 01/07/2015   HDL 40.20 01/07/2015   LDLDIRECT 64.7 01/01/2012   LDLCALC 62 01/07/2015   ALT 20 01/07/2015   AST 21 01/07/2015   NA 138 01/07/2015   K  4.5 01/07/2015   CL 106 01/07/2015   CREATININE 1.48* 01/25/2015   BUN 34* 01/25/2015   CO2 27 01/07/2015   TSH 0.79 08/20/2014   INR 1.03 11/28/2013   HGBA1C 4.5* 04/04/2013   MICROALBUR 1.1 04/04/2013    No results found.  Assessment & Plan:   Problem List Items Addressed This Visit      Unprioritized   CKD (chronic kidney disease) stage 3, GFR 30-59 ml/min    her GFR has decreased.  Advised to suspend the spironolactone and recheck Cr in 1-2 weeks       Relevant Orders   Comprehensive metabolic panel   Basic metabolic panel   Cystitis    She had persistent symptoms despite taking macrobid.  Given her multiple drug allergies and previous urine culture we treated empirically with cephalexin and with diflucan .  Fortunately the repeat culture which shows persistent UTI is sensitive to cephalexin       Hypothyroidism    Other Visit Diagnoses    Dysuria    -  Primary    Relevant Orders    POCT urinalysis dipstick (Completed)    Urinalysis, Routine w reflex microscopic (Completed)    Urine culture (Completed)    Hyperlipidemia           I have discontinued Ms. Ozturk's spironolactone, desloratadine, zolpidem, ciprofloxacin, and nitrofurantoin (macrocrystal-monohydrate). I am also having her start on phenazopyridine, fluconazole, and KEFLEX. Additionally, I am having her maintain her sodium chloride, NEEDLE (DISP) 23 G, doxepin,  vitamin C, OVER THE COUNTER MEDICATION, cyanocobalamin, losartan, SE-TAN PLUS, CRESTOR, isosorbide mononitrate, AMBIEN, CLARINEX, SYNTHROID, oxyCODONE-acetaminophen, benzonatate, amLODipine, escitalopram, Prenatal Vit-Fe Fumarate-FA (PRENATAL 1 PLUS 1 PO), levothyroxine, lidocaine, montelukast, pyridOXINE, Coenzyme Q10, ergocalciferol, ibuprofen, and LOVAZA.  Meds ordered this encounter  Medications  . phenazopyridine (PYRIDIUM) 200 MG tablet    Sig: Take 1 tablet (200 mg total) by mouth 3 (three) times daily as needed for pain.    Dispense:  10 tablet    Refill:  5  . fluconazole (DIFLUCAN) 150 MG tablet    Sig: Take 1 tablet (150 mg total) by mouth daily.    Dispense:  2 tablet    Refill:  1  . KEFLEX 500 MG capsule    Sig: Take 1 capsule (500 mg total) by mouth 4 (four) times daily.    Dispense:  28 capsule    Refill:  0    NAME BRAND ONLY    Medications Discontinued During This Encounter  Medication Reason  . Ascorbic Acid (VITAMIN C) 1683 MG tablet Duplicate  . ciprofloxacin (CIPRO) 250 MG tablet Change in therapy  . desloratadine (CLARINEX) 5 MG tablet Duplicate  . nitrofurantoin, macrocrystal-monohydrate, (MACROBID) 100 MG capsule Completed Course  . zolpidem (AMBIEN) 5 MG tablet Duplicate  . spironolactone (ALDACTONE) 25 MG tablet     Follow-up: No Follow-up on file.   Crecencio Mc, MD

## 2015-02-13 LAB — URINALYSIS, MICROSCOPIC ONLY
Casts: NONE SEEN
Crystals: NONE SEEN
SQUAMOUS EPITHELIAL / LPF: NONE SEEN

## 2015-02-13 LAB — URINALYSIS, ROUTINE W REFLEX MICROSCOPIC
Bilirubin Urine: NEGATIVE
Glucose, UA: NEGATIVE mg/dL
KETONES UR: NEGATIVE mg/dL
Nitrite: POSITIVE — AB
PROTEIN: NEGATIVE mg/dL
Specific Gravity, Urine: 1.009 (ref 1.005–1.030)
Urobilinogen, UA: 0.2 mg/dL (ref 0.0–1.0)
pH: 6 (ref 5.0–8.0)

## 2015-02-14 ENCOUNTER — Other Ambulatory Visit: Payer: Self-pay | Admitting: Internal Medicine

## 2015-02-15 ENCOUNTER — Encounter: Payer: Self-pay | Admitting: Internal Medicine

## 2015-02-15 LAB — URINE CULTURE

## 2015-02-15 NOTE — Telephone Encounter (Signed)
Last visit 02/12/15, ok refill?

## 2015-02-15 NOTE — Telephone Encounter (Signed)
Ok to refill,  Authorized in Dentist. please call in

## 2015-02-15 NOTE — Assessment & Plan Note (Signed)
her GFR has decreased.  Advised to suspend the spironolactone and recheck Cr in 1-2 weeks

## 2015-02-15 NOTE — Telephone Encounter (Signed)
Rx phoned into pharmacy.

## 2015-02-15 NOTE — Assessment & Plan Note (Signed)
She had persistent symptoms despite taking macrobid.  Given her multiple drug allergies and previous urine culture we treated empirically with cephalexin and with diflucan .  Fortunately the repeat culture which shows persistent UTI is sensitive to cephalexin

## 2015-02-16 ENCOUNTER — Other Ambulatory Visit: Payer: Self-pay | Admitting: Internal Medicine

## 2015-02-16 NOTE — Telephone Encounter (Signed)
Last visit 02/12/15, ok refill?

## 2015-02-18 NOTE — Telephone Encounter (Signed)
Faxed to pharmacy

## 2015-02-18 NOTE — Telephone Encounter (Signed)
Ok to refill,  printed rx  

## 2015-02-23 ENCOUNTER — Telehealth: Payer: Self-pay | Admitting: *Deleted

## 2015-02-23 MED ORDER — OXYCODONE-ACETAMINOPHEN 10-325 MG PO TABS: 1.0000 | ORAL_TABLET | Freq: Three times a day (TID) | ORAL | Status: DC | PRN

## 2015-02-23 NOTE — Telephone Encounter (Signed)
Spoke with pt, advised Rx would be ready 7.6.16 as requested, at time of lab appoint

## 2015-02-23 NOTE — Telephone Encounter (Signed)
Pt called requesting her Percocet refill.  Last OV 6.24.16.  Please advise refill.

## 2015-02-23 NOTE — Telephone Encounter (Signed)
90 day supply authorized and printed  

## 2015-02-24 ENCOUNTER — Encounter: Payer: Self-pay | Admitting: General Surgery

## 2015-02-24 ENCOUNTER — Other Ambulatory Visit (INDEPENDENT_AMBULATORY_CARE_PROVIDER_SITE_OTHER): Payer: Federal, State, Local not specified - PPO

## 2015-02-24 ENCOUNTER — Ambulatory Visit (INDEPENDENT_AMBULATORY_CARE_PROVIDER_SITE_OTHER): Payer: Federal, State, Local not specified - PPO | Admitting: General Surgery

## 2015-02-24 ENCOUNTER — Other Ambulatory Visit: Payer: Federal, State, Local not specified - PPO

## 2015-02-24 ENCOUNTER — Telehealth: Payer: Self-pay | Admitting: *Deleted

## 2015-02-24 VITALS — BP 130/76 | HR 76 | Resp 14 | Ht 63.0 in | Wt 221.0 lb

## 2015-02-24 DIAGNOSIS — N183 Chronic kidney disease, stage 3 unspecified: Secondary | ICD-10-CM

## 2015-02-24 DIAGNOSIS — N3000 Acute cystitis without hematuria: Secondary | ICD-10-CM

## 2015-02-24 DIAGNOSIS — I2510 Atherosclerotic heart disease of native coronary artery without angina pectoris: Secondary | ICD-10-CM | POA: Diagnosis not present

## 2015-02-24 DIAGNOSIS — K633 Ulcer of intestine: Secondary | ICD-10-CM | POA: Diagnosis not present

## 2015-02-24 DIAGNOSIS — R1031 Right lower quadrant pain: Secondary | ICD-10-CM

## 2015-02-24 LAB — RENAL FUNCTION PANEL
ALBUMIN: 4 g/dL (ref 3.5–5.2)
BUN: 27 mg/dL — ABNORMAL HIGH (ref 6–23)
CALCIUM: 10 mg/dL (ref 8.4–10.5)
CHLORIDE: 107 meq/L (ref 96–112)
CO2: 25 meq/L (ref 19–32)
Creatinine, Ser: 1.31 mg/dL — ABNORMAL HIGH (ref 0.40–1.20)
GFR: 42.41 mL/min — ABNORMAL LOW (ref >60.00)
Glucose, Bld: 121 mg/dL — ABNORMAL HIGH (ref 70–99)
POTASSIUM: 4.1 meq/L (ref 3.5–5.1)
Phosphorus: 3.1 mg/dL (ref 2.3–4.6)
SODIUM: 142 meq/L (ref 135–145)

## 2015-02-24 LAB — BASIC METABOLIC PANEL
BUN: 27 mg/dL — ABNORMAL HIGH (ref 6–23)
CO2: 25 meq/L (ref 19–32)
Calcium: 10 mg/dL (ref 8.4–10.5)
Chloride: 107 mEq/L (ref 96–112)
Creatinine, Ser: 1.31 mg/dL — ABNORMAL HIGH (ref 0.40–1.20)
GFR: 42.41 mL/min — AB (ref >60.00)
Glucose, Bld: 121 mg/dL — ABNORMAL HIGH (ref 70–99)
POTASSIUM: 4.1 meq/L (ref 3.5–5.1)
Sodium: 142 mEq/L (ref 135–145)

## 2015-02-24 LAB — LIPID PANEL
CHOLESTEROL: 136 mg/dL (ref 0–200)
HDL: 43.6 mg/dL (ref >39.00)
LDL Cholesterol: 66 mg/dL (ref 0–99)
NonHDL: 92.4
Total CHOL/HDL Ratio: 3
Triglycerides: 134 mg/dL (ref 0.0–149.0)
VLDL: 26.8 mg/dL (ref 0.0–40.0)

## 2015-02-24 LAB — URINALYSIS, ROUTINE W REFLEX MICROSCOPIC
Bilirubin Urine: NEGATIVE
HGB URINE DIPSTICK: NEGATIVE
Ketones, ur: NEGATIVE
Leukocytes, UA: NEGATIVE
NITRITE: NEGATIVE
PH: 7 (ref 5.0–8.0)
RBC / HPF: NONE SEEN (ref 0–?)
Specific Gravity, Urine: 1.01 (ref 1.000–1.030)
Total Protein, Urine: NEGATIVE
Urine Glucose: NEGATIVE
Urobilinogen, UA: 0.2 (ref 0.0–1.0)

## 2015-02-24 LAB — COMPREHENSIVE METABOLIC PANEL
ALBUMIN: 4 g/dL (ref 3.5–5.2)
ALT: 18 U/L (ref 0–35)
AST: 17 U/L (ref 0–37)
Alkaline Phosphatase: 82 U/L (ref 39–117)
BUN: 27 mg/dL — ABNORMAL HIGH (ref 6–23)
CALCIUM: 10 mg/dL (ref 8.4–10.5)
CO2: 25 meq/L (ref 19–32)
Chloride: 107 mEq/L (ref 96–112)
Creatinine, Ser: 1.31 mg/dL — ABNORMAL HIGH (ref 0.40–1.20)
GFR: 42.41 mL/min — ABNORMAL LOW (ref >60.00)
Glucose, Bld: 121 mg/dL — ABNORMAL HIGH (ref 70–99)
POTASSIUM: 4.1 meq/L (ref 3.5–5.1)
SODIUM: 142 meq/L (ref 135–145)
Total Bilirubin: 0.6 mg/dL (ref 0.2–1.2)
Total Protein: 6.5 g/dL (ref 6.0–8.3)

## 2015-02-24 MED ORDER — POLYETHYLENE GLYCOL 3350 17 GM/SCOOP PO POWD: ORAL | Status: DC

## 2015-02-24 NOTE — Patient Instructions (Addendum)
Colonoscopy A colonoscopy is an exam to look at the entire large intestine (colon). This exam can help find problems such as tumors, polyps, inflammation, and areas of bleeding. The exam takes about 1 hour.  [facility] CARE PROVIDER KNOW ABOUT:   Any allergies you have.  All medicines you are taking, including vitamins, herbs, eye drops, creams, and over-the-counter medicines.  Previous problems you or members of your family have had with the use of anesthetics.  Any blood disorders you have.  Previous surgeries you have had.  Medical conditions you have. RISKS AND COMPLICATIONS  Generally, this is a safe procedure. However, as with any procedure, complications can occur. Possible complications include:  Bleeding.  Tearing or rupture of the colon wall.  Reaction to medicines given during the exam.  Infection (rare). BEFORE THE PROCEDURE   Ask your health care provider about changing or stopping your regular medicines.  You may be prescribed an oral bowel prep. This involves drinking a large amount of medicated liquid, starting the day before your procedure. The liquid will cause you to have multiple loose stools until your stool is almost clear or light green. This cleans out your colon in preparation for the procedure.  Do not eat or drink anything else once you have started the bowel prep, unless your health care provider tells you it is safe to do so.  Arrange for someone to drive you home after the procedure. PROCEDURE   You will be given medicine to help you relax (sedative).  You will lie on your side with your knees bent.  A long, flexible tube with a light and camera on the end (colonoscope) will be inserted through the rectum and into the colon. The camera sends video back to a computer screen as it moves through the colon. The colonoscope also releases carbon dioxide gas to inflate the colon. This helps your health care provider see the area better.  During  the exam, your health care provider may take a small tissue sample (biopsy) to be examined under a microscope if any abnormalities are found.  The exam is finished when the entire colon has been viewed. AFTER THE PROCEDURE   Do not drive for 24 hours after the exam.  You may have a small amount of blood in your stool.  You may pass moderate amounts of gas and have mild abdominal cramping or bloating. This is caused by the gas used to inflate your colon during the exam.  Ask when your test results will be ready and how you will get your results. Make sure you get your test results. Document Released: 08/04/2000 Document Revised: 05/28/2013 Document Reviewed: 04/14/2013 [facility] Patient Information 2015 [place], [place]. This information is not intended to replace advice given to you by your health care provider. Make sure you discuss any questions you have with your health care provider.  Patient has been scheduled for a colonoscopy on 03-09-15 at [facility].

## 2015-02-24 NOTE — Telephone Encounter (Signed)
If she requested it, and left urine,  Yes,  Labs ordered.

## 2015-02-24 NOTE — Progress Notes (Signed)
Patient ID: [name], female   DOB: 02/26/1941, 74 y.o.   MRN: [id number]  Chief Complaint  Patient presents with  . Follow-up    visceral Angiography    HPI [name] is a 74 y.o. female here today for her follow up Visceral Angiography and stent placement of SMA done on 01/25/15. No current abdominal pain or bleeding    HPI  Past Medical History  Diagnosis Date  . Degenerative disc disease     HERNITAED DISC BY MRI ON 07/07;S/P LAMINECTOMY,RID REPLACEMENT  . Brain aneurysm     S/P RESECTION  . Discoid lupus erythematosus eyelid     NEGATIVE SEROLOGIC MARKERS  . Hypercholesterolemia   . Chronic sinusitis   . Hypertension   . Dysrhythmia     PVC's occ.  . Pneumonia 05/30/2010    after receiving Propofol  . Headache(784.0)     migraines occ.  . CHF (congestive heart failure) 05/2010    after receiving Propofol  . Hypothyroidism     takes Synthroid  . Blood transfusion 2009    back surgery  . Asthma 06/2010    depending on what around  . Complication of anesthesia 05/30/2010    from colonoscopy-had Propofol-had a  . Complication of anesthesia     reaction-went into asthma-pneumonia,-  . Complication of anesthesia     then CHF and to ICU  . Sleep apnea     uses CPAP-8.4-oxygen suppliment  . Renal insufficiency     Patient states she has been told she has Stage I kidney disease    Past Surgical History  Procedure Laterality Date  . Cosmetic surgery    . Tonsillectomy  1963  . Back surgery  1968,1988,2002,2009    4 laminectomies and fusions  . Abdominal hysterectomy  1988    uterus only  . Brain surgery  2005    coiling of cranial aneurysum  . Excision morton's neuroma  1990    left foot  . Nasal sinus surgery  1977-2008  . Lipomas  1975    3 from right breast  . Tympanic membrane repair  2009-last one    x5 times  . Total knee arthroplasty  07/10/2011    Procedure: TOTAL KNEE ARTHROPLASTY;  Surgeon: Gearlean Alf;  Location: [facility];  Service:  Orthopedics;  Laterality: Right;  . Joint replacement  2013    Right knee,  [place]  . Eye surgery      both cataracts -[place] 7/14  . Foot arthrodesis Right 05/22/2013    Procedure: RIGHT HALLUX METATARSAL PHALANGEAL JOINT ARTHRODESIS ;  Surgeon: Wylene Simmer, MD;  Location: [place];  Service: Orthopedics;  Laterality: Right;  . Right heart catheterization N/A 11/28/2013    Procedure: RIGHT HEART CATH;  Surgeon: Jolaine Artist, MD;  Location: [facility] C[facility] LAB;  Service: Cardiovascular;  Laterality: N/A;  . Colonoscopy  2008  . Colonoscopy with propofol N/A 01/13/2015    Procedure: COLONOSCOPY WITH PROPOFOL;  Surgeon: Christene Lye, MD;  Location: [facility] ENDOSCOPY;  Service: Endoscopy;  Laterality: N/A;  . Peripheral vascular catheterization N/A 01/25/2015    Procedure: Visceral Angiography;  Surgeon: Algernon Huxley, MD;  Location: [place] CV LAB;  Service: Cardiovascular;  Laterality: N/A;    Family History  Problem Relation Age of Onset  . Heart disease Mother   . Stroke Mother   . Alcohol abuse Other   . Cancer Sister     leukemia  . Cancer Brother  lung Ca, asbestos    Social History History  Substance Use Topics  . Smoking status: Never Smoker   . Smokeless tobacco: Never Used     Comment: REMOTELY QUIT  TOBACCO USE  . Alcohol Use: Yes     Comment: occasional    Allergies  Allergen Reactions  . Penicillins Anaphylaxis    Septra ANTI-INFECTIVE AGENTS-MISC..WELTS,ITCHING  . Penicillins Hives  . Propofol Other (See Comments)    O2 drops   . Propranolol     chf  . Triamcinolone Acetonide Hives  . Adhesive [Tape] Rash  . Kenalog [Triamcinolone Acetonide] Itching and Rash  . Septra [Bactrim] Swelling and Rash  . Sulfur Rash    Current Outpatient Prescriptions  Medication Sig Dispense Refill  . AMBIEN 5 MG tablet TAKE 1 TABLET BY MOUTH AT BEDTIME AS NEEDED FOR SLEEP 30 tablet 5  . amLODipine (NORVASC) 10 MG tablet Take 10 mg by mouth daily.     . Ascorbic Acid (VITAMIN C) 1000 MG tablet Take 500 mg by mouth daily.     . benzonatate (TESSALON) 100 MG capsule TAKE 1 CAPSULE (100 MG TOTAL) BY MOUTH 2 (TWO) TIMES DAILY. 60 capsule 1  . CLARINEX 5 MG tablet TAKE 1 TABLET (5 MG TOTAL) BY MOUTH EVERY MORNING. 90 tablet 1  . Coenzyme Q10 100 MG TABS Take 1 tablet by mouth daily.    . CRESTOR 20 MG tablet TAKE 1 TABLET (20 MG TOTAL) BY MOUTH DAILY. 30 tablet 5  . cyanocobalamin (,VITAMIN B-12,) 1000 MCG/ML injection For use with monthly B12 injections 10 mL 2  . diazepam (VALIUM) 5 MG tablet Take 1 tablet (5 mg total) by mouth 2 (two) times daily. As needed for anxiety or muscle spasm 180 tablet 0  . doxepin (SINEQUAN) 25 MG capsule Take 2 capsules (50 mg total) by mouth at bedtime. 30 capsule 5  . ergocalciferol (VITAMIN D2) 50000 UNITS capsule Take 50,000 Units by mouth once a week. Wednesday    . escitalopram (LEXAPRO) 10 MG tablet Take 10 mg by mouth daily.    Marland Kitchen FeFum-FePo-FA-B Cmp-C-Zn-Mn-Cu (SE-TAN PLUS) 162-115.2-1 MG CAPS TAKE 1 CAPSULE BY MOUTH DAILY. 30 capsule 5  . fluconazole (DIFLUCAN) 150 MG tablet Take 1 tablet (150 mg total) by mouth daily. 2 tablet 1  . isosorbide mononitrate (IMDUR) 60 MG 24 hr tablet TAKE 1 TABLET (60 MG TOTAL) BY MOUTH DAILY. 90 tablet 2  . levothyroxine (SYNTHROID, LEVOTHROID) 150 MCG tablet Take 150 mcg by mouth daily before breakfast.    . lidocaine (LIDODERM) 5 % Place 3 patches onto the skin daily. Remove & Discard patch within 12 hours or as directed by MD    . losartan (COZAAR) 100 MG tablet Take 1 tablet (100 mg total) by mouth daily. 90 tablet 2  . LOVAZA 1 G capsule TAKE 2 CAPSULES (2 G TOTAL) BY MOUTH 2 (TWO) TIMES DAILY. 360 capsule 1  . montelukast (SINGULAIR) 10 MG tablet Take 10 mg by mouth at bedtime.    Marland Kitchen NEEDLE, DISP, 23 G (BD DISP NEEDLE) 23G X 1" MISC Uses to inject b12 into muscle once monthly 12 each 1  . OVER THE COUNTER MEDICATION Take 1 capsule by mouth daily. Florify    .  oxyCODONE-acetaminophen (PERCOCET) 10-325 MG per tablet Take 1 tablet by mouth every 8 (eight) hours as needed for pain. May refill on or after April 26 2015 90 tablet 0  . Prenatal Vit-Fe Fumarate-FA (PRENATAL 1 PLUS 1 PO) Take 1  tablet by mouth daily.    Marland Kitchen pyridOXINE (VITAMIN B-6) 100 MG tablet Take 100 mg by mouth daily.    . sodium chloride (OCEAN) 0.65 % nasal spray Place 2 sprays into the nose as needed. Allergies      . polyethylene glycol powder (GLYCOLAX/MIRALAX) powder 255 grams one bottle for colonoscopy prep 255 g 0   No current facility-administered medications for this visit.   Facility-Administered Medications Ordered in Other Visits  Medication Dose Route Frequency Provider Last Rate Last Dose  . chlorhexidine (HIBICLENS) 4 % liquid 4 application  60 mL Topical Once Gaynelle Arabian, MD        Review of Systems Review of Systems  Constitutional: Negative.   Respiratory: Positive for cough.   Cardiovascular: Positive for leg swelling.  Gastrointestinal: Negative.     Blood pressure 130/76, pulse 76, resp. rate 14, height 5\' 3"  (1.6 m), weight 221 lb (100.245 kg).  Physical Exam Physical Exam  Constitutional: She is oriented to person, place, and time. She appears well-developed and well-nourished.  Eyes: Conjunctivae are normal. No scleral icterus.  Neck: Neck supple.  Cardiovascular: Normal rate, regular rhythm and normal heart sounds.   Pulmonary/Chest: Effort normal. She has wheezes (scattered bilaterally).  Abdominal: Soft. Normal appearance and bowel sounds are normal. There is no tenderness.  Lymphadenopathy:    She has no cervical adenopathy.  Neurological: She is alert and oriented to person, place, and time.  Skin: Skin is warm and dry.  Psychiatric: She has a normal mood and affect.    Data Reviewed Prior notes  Assessment    S/p visceral angiography with stent placement of SMA. Ischemic ulcer of cecum.    Plan for follow up colonoscopy to assess  the ulcer in cecum  Plan    Colonoscopy to be arranged.  Patient has been scheduled for a colonoscopy on 03-09-15 at [facility].     PCP:  Deirdre Pippins 02/24/2015, 1:53 PM

## 2015-02-24 NOTE — Telephone Encounter (Signed)
Pt came in for labs and would [facility] for you to run her urine ?

## 2015-02-25 ENCOUNTER — Other Ambulatory Visit: Payer: Self-pay | Admitting: Internal Medicine

## 2015-02-25 ENCOUNTER — Encounter: Payer: Self-pay | Admitting: Internal Medicine

## 2015-02-25 ENCOUNTER — Telehealth: Payer: Self-pay

## 2015-02-25 LAB — URINE CULTURE
Colony Count: NO GROWTH
Organism ID, Bacteria: NO GROWTH

## 2015-02-25 NOTE — Telephone Encounter (Signed)
Patient submitted a Ua on 02/24/15 and was advised by front desk a script had been called to pharmacy, Ua in chart but culture still pending.

## 2015-02-25 NOTE — Telephone Encounter (Signed)
Pt called again requesting a refill on Keflex.  Pt states her UTI has returned.  Please advise

## 2015-02-25 NOTE — Telephone Encounter (Signed)
And the UA was completely normal . NO antibiotic was ordered. Where did [name] get that information?

## 2015-02-25 NOTE — Telephone Encounter (Signed)
Not in chart

## 2015-02-25 NOTE — Telephone Encounter (Signed)
Refill denied,  She needs to come by and submit a urine sample.

## 2015-02-25 NOTE — Telephone Encounter (Signed)
See below .  Needs to submit urine for  ua and culture . appt not needed

## 2015-02-25 NOTE — Telephone Encounter (Signed)
Spoke with pt, she is upset that she was told this morning that Keflex was being called in to pharmacy.  I advised pt once again that her UA culture was not back and an antibiotic was not called in because we do not have the UA culture.  Pt stated "somebody needs to get their tail straight.  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 Pt further states that at her appoint with Dr Jamal Collin 7.6.16 he noticed wheezing and told her to be evaluated by PCP. Pt also requests her Creatine results.  Please advise if pt may be seen tomorrow 7.8.16.

## 2015-02-25 NOTE — Telephone Encounter (Signed)
Please advise of antibiotic, not one in chart.

## 2015-02-25 NOTE — Telephone Encounter (Signed)
Last OV and refill 6.24.16.  Please advise refill.

## 2015-02-25 NOTE — Telephone Encounter (Signed)
I do not have an appointment tomorrow, id one of the doctors has an opening please give her an appointment  Please reiterate to her that there was no evidence of a UTI on the UA from July 6, so I do NOT knwo why carolyn told her that an antibiotic was being sent to her pharmacy.    Her creatinine is 1.31 which is improved from June.

## 2015-02-25 NOTE — Telephone Encounter (Signed)
The patient called and asked about her urine being tested.  I relayed to the patient that an antibiotic was called into her pharmacy this am.

## 2015-02-26 ENCOUNTER — Ambulatory Visit
Admission: RE | Admit: 2015-02-26 | Discharge: 2015-02-26 | Disposition: A | Discharge status: Home / Self Care | Payer: Federal, State, Local not specified - PPO | Source: Ambulatory Visit | Attending: Internal Medicine | Admitting: Internal Medicine

## 2015-02-26 ENCOUNTER — Other Ambulatory Visit: Payer: Self-pay | Admitting: Pulmonary Disease

## 2015-02-26 ENCOUNTER — Ambulatory Visit (INDEPENDENT_AMBULATORY_CARE_PROVIDER_SITE_OTHER): Payer: Federal, State, Local not specified - PPO | Admitting: Internal Medicine

## 2015-02-26 VITALS — BP 132/74 | HR 84 | Temp 98.7°F | Resp 22

## 2015-02-26 DIAGNOSIS — J189 Pneumonia, unspecified organism: Secondary | ICD-10-CM

## 2015-02-26 DIAGNOSIS — R05 Cough: Secondary | ICD-10-CM

## 2015-02-26 DIAGNOSIS — R059 Cough, unspecified: Secondary | ICD-10-CM

## 2015-02-26 DIAGNOSIS — R399 Unspecified symptoms and signs involving the genitourinary system: Secondary | ICD-10-CM | POA: Diagnosis not present

## 2015-02-26 DIAGNOSIS — J441 Chronic obstructive pulmonary disease with (acute) exacerbation: Secondary | ICD-10-CM

## 2015-02-26 DIAGNOSIS — I2510 Atherosclerotic heart disease of native coronary artery without angina pectoris: Secondary | ICD-10-CM

## 2015-02-26 DIAGNOSIS — N3090 Cystitis, unspecified without hematuria: Secondary | ICD-10-CM | POA: Diagnosis not present

## 2015-02-26 DIAGNOSIS — J9811 Atelectasis: Secondary | ICD-10-CM | POA: Insufficient documentation

## 2015-02-26 LAB — POCT URINALYSIS DIPSTICK
BILIRUBIN UA: NEGATIVE
Glucose, UA: 100
Ketones, UA: NEGATIVE
Nitrite, UA: POSITIVE
PH UA: 6
Protein, UA: NEGATIVE
Spec Grav, UA: 1.01
Urobilinogen, UA: 1

## 2015-02-26 LAB — URINALYSIS, ROUTINE W REFLEX MICROSCOPIC
BILIRUBIN URINE: NEGATIVE
Ketones, ur: NEGATIVE
Nitrite: POSITIVE — AB
Urine Glucose: 100 — AB
Urobilinogen, UA: 2 — AB (ref 0.0–1.0)
pH: 6.5 (ref 5.0–8.0)

## 2015-02-26 MED ORDER — BENZONATATE 200 MG PO CAPS: 200.0000 mg | ORAL_CAPSULE | Freq: Three times a day (TID) | ORAL | Status: DC | PRN

## 2015-02-26 MED ORDER — AZITHROMYCIN 500 MG PO TABS: 500.0000 mg | ORAL_TABLET | Freq: Every day | ORAL | Status: DC

## 2015-02-26 MED ORDER — PREDNISONE 10 MG PO TABS: ORAL_TABLET | ORAL | Status: DC

## 2015-02-26 MED ORDER — BUDESONIDE-FORMOTEROL FUMARATE 80-4.5 MCG/ACT IN AERO: 2.0000 | INHALATION_SPRAY | Freq: Two times a day (BID) | RESPIRATORY_TRACT | Status: DC

## 2015-02-26 NOTE — Progress Notes (Signed)
Pre visit review using our clinic review tool, if applicable. No additional management support is needed unless otherwise documented below in the visit note. 

## 2015-02-26 NOTE — Telephone Encounter (Signed)
I will apologize to [name] when she comes in.  I glanced over the med refill and noticed the keflex was ordered, but did not see the "medicaiton refused due to needing an appointment".  Dr.Tullo, that is where this information came from- and I apologize for the error.   I will also apologize to the pt for this error.   Thank you all for being understanding when human error happens.  I hope our patients are understanding also.

## 2015-02-26 NOTE — Progress Notes (Signed)
Subjective:  Patient ID: [name], female    DOB: [birth year]  Age: 74 y.o. MRN: [id number]  CC: The primary encounter diagnosis was UTI symptoms. Diagnoses of Cough, Cystitis, CAP (community acquired pneumonia), and COPD exacerbation were also pertinent to this visit.  HPI [name] presents for   1)  PERSISTENT urethral pain .  Patient was recently treated for cystitis with 3 rounds of antibiotics, initially cipro on 6/13, changed to [place] due to culture results , which was finished on 02/12/15 . Repeat ua and culture was done due to persistent symptoms  And she was treated with Keflex  Qid 500 mg on June 24 for 7 days   Along with 2 days of diflucan.   Patient submitted another urine specimen on July 6th which was normal, and Urine culture was also normal.  She is concerned today that she did not receive appropriate treatment .    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  Has been using her nebulizer with albuterol  MDI which she started using since Sunday     Outpatient Prescriptions Prior to Visit  Medication Sig Dispense Refill  . AMBIEN 5 MG tablet TAKE 1 TABLET BY MOUTH AT BEDTIME AS NEEDED FOR SLEEP 30 tablet 5  . amLODipine (NORVASC) 10 MG tablet Take 10 mg by mouth daily.    . Ascorbic Acid (VITAMIN C) 1000 MG tablet Take 500 mg by mouth daily.     . benzonatate (TESSALON) 100 MG capsule TAKE 1 CAPSULE (100 MG TOTAL) BY MOUTH 2 (TWO) TIMES DAILY. 60 capsule 1  . CLARINEX 5 MG tablet TAKE 1 TABLET (5 MG TOTAL) BY MOUTH EVERY MORNING. 90 tablet 1  . Coenzyme Q10 100 MG TABS Take 1 tablet by mouth daily.     . CRESTOR 20 MG tablet TAKE 1 TABLET (20 MG TOTAL) BY MOUTH DAILY. 30 tablet 5  . cyanocobalamin (,VITAMIN B-12,) 1000 MCG/ML injection For use with monthly B12 injections 10 mL 2  . diazepam (VALIUM) 5 MG tablet Take 1 tablet (5 mg total) by mouth 2 (two) times daily. As needed for anxiety or muscle spasm 180 tablet 0  . doxepin (SINEQUAN) 25 MG capsule Take 2 capsules (50 mg total) by mouth at bedtime. 30 capsule 5  . ergocalciferol (VITAMIN D2) 50000 UNITS capsule Take 50,000 Units by mouth once a week. Wednesday    . escitalopram (LEXAPRO) 10 MG tablet Take 10 mg by mouth daily.    Marland Kitchen FeFum-FePo-FA-B Cmp-C-Zn-Mn-Cu (SE-TAN PLUS) 162-115.2-1 MG CAPS TAKE 1 CAPSULE BY MOUTH DAILY. 30 capsule 5  . fluconazole (DIFLUCAN) 150 MG tablet Take 1 tablet (150 mg total) by mouth daily. 2 tablet 1  . isosorbide mononitrate (IMDUR) 60 MG 24 hr tablet TAKE 1 TABLET (60 MG TOTAL) BY MOUTH DAILY. 90 tablet 2  . levothyroxine (SYNTHROID, LEVOTHROID) 150 MCG tablet Take 150 mcg by mouth daily before breakfast.    . lidocaine (LIDODERM) 5 % Place 3 patches onto the skin daily. Remove & Discard patch within 12 hours or as directed by MD    .  losartan (COZAAR) 100 MG tablet Take 1 tablet (100 mg total) by mouth daily. 90 tablet 2  . LOVAZA 1 G capsule TAKE 2 CAPSULES (2 G TOTAL) BY MOUTH 2 (TWO) TIMES DAILY. 360 capsule 1  . montelukast (SINGULAIR) 10 MG tablet Take 10 mg by mouth at bedtime.    Marland Kitchen NEEDLE, DISP, 23 G (BD DISP NEEDLE) 23G X 1" MISC Uses to inject b12 into muscle once monthly 12 each 1  . OVER THE COUNTER MEDICATION Take 1 capsule by mouth daily. Florify    . oxyCODONE-acetaminophen (PERCOCET) 10-325 MG per tablet Take 1 tablet by mouth every 8 (eight) hours as needed for pain. May refill on or after April 26 2015 90 tablet 0  . polyethylene glycol powder (GLYCOLAX/MIRALAX) powder 255 grams one bottle for colonoscopy prep 255 g 0  . Prenatal Vit-Fe Fumarate-FA (PRENATAL 1 PLUS 1 PO) Take 1 tablet  by mouth daily.    Marland Kitchen pyridOXINE (VITAMIN B-6) 100 MG tablet Take 100 mg by mouth daily.    . sodium chloride (OCEAN) 0.65 % nasal spray Place 2 sprays into the nose as needed. Allergies       Facility-Administered Medications Prior to Visit  Medication Dose Route Frequency Provider Last Rate Last Dose  . chlorhexidine (HIBICLENS) 4 % liquid 4 application  60 mL Topical Once Gaynelle Arabian, MD        Review of Systems;  Patient denies headache, fevers, malaise, unintentional weight loss, skin rash, eye pain, sinus congestion and sinus pain, sore throat, dysphagia,  hemoptysis , cough, dyspnea, wheezing, chest pain, palpitations, orthopnea, edema, abdominal pain, nausea, Lewis, diarrhea, constipation, flank pain, dysuria, hematuria, urinary  Frequency, nocturia, numbness, tingling, seizures,  Focal weakness, Loss of consciousness,  Tremor, insomnia, depression, anxiety, and suicidal ideation.      Objective:  BP 132/74 mmHg  Pulse 84  Temp(Src) 98.7 F (37.1 C) (Oral)  Resp 22  SpO2 94%  BP Readings from Last 3 Encounters:  02/26/15 132/74  02/24/15 130/76  02/12/15 120/67    Wt Readings from Last 3 Encounters:  02/24/15 221 lb (100.245 kg)  02/12/15 219 lb 12 oz (99.678 kg)  02/01/15 224 lb (101.606 kg)    General appearance: alert, cooperative and appears stated age Ears: normal TM's and external ear canals both ears Throat: lips, mucosa, and tongue normal; teeth and gums normal Neck: no adenopathy, no carotid bruit, supple, symmetrical, trachea midline and thyroid not enlarged, symmetric, no tenderness/mass/nodules Back: symmetric, no curvature. ROM normal. No CVA tenderness. Lungs: clear to auscultation bilaterally Heart: regular rate and rhythm, S1, S2 normal, no murmur, click, rub or gallop Abdomen: soft, non-tender; bowel sounds normal; no masses,  no organomegaly Pulses: 2+ and symmetric Skin: Skin color, texture, turgor normal. No rashes or lesions Lymph nodes:  Cervical, supraclavicular, and axillary nodes normal.  Lab Results  Component Value Date   HGBA1C 4.5* 04/04/2013   HGBA1C 5.8 04/18/2011    Lab Results  Component Value Date   CREATININE 1.31* 02/24/2015   CREATININE 1.31* 02/24/2015   CREATININE 1.31* 02/24/2015    Lab Results  Component Value Date   WBC 11.5* 01/04/2015   HGB 12.6 01/04/2015   HCT 37.4 01/04/2015   PLT 171.0 01/04/2015   GLUCOSE 121* 02/24/2015   GLUCOSE 121* 02/24/2015   GLUCOSE 121* 02/24/2015   CHOL 136 02/24/2015   TRIG 134.0 02/24/2015   HDL 43.60 02/24/2015   LDLDIRECT 64.7 01/01/2012   LDLCALC 66 02/24/2015   ALT 18  02/24/2015   AST 17 02/24/2015   NA 142 02/24/2015   NA 142 02/24/2015   NA 142 02/24/2015   K 4.1 02/24/2015   K 4.1 02/24/2015   K 4.1 02/24/2015   CL 107 02/24/2015   CL 107 02/24/2015   CL 107 02/24/2015   CREATININE 1.31* 02/24/2015   CREATININE 1.31* 02/24/2015   CREATININE 1.31* 02/24/2015   BUN 27* 02/24/2015   BUN 27* 02/24/2015   BUN 27* 02/24/2015   CO2 25 02/24/2015   CO2 25 02/24/2015   CO2 25 02/24/2015   TSH 0.79 08/20/2014   INR 1.03 11/28/2013   HGBA1C 4.5* 04/04/2013   MICROALBUR 1.1 04/04/2013    No results found.  Assessment & Plan:   Problem List Items Addressed This Visit      Unprioritized   Cystitis    Recurrent , by today's UA.  Awaiting urine culture given recurrence in short time .       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 Empiric azithromycin was started but may need to change to levaquin pending legionella Ag and urine culture        Relevant Medications   azithromycin (ZITHROMAX) 500 MG tablet   benzonatate (TESSALON) 200 MG capsule   budesonide-formoterol (SYMBICORT) 80-4.5 MCG/ACT inhaler   COPD exacerbation    Secondary to lobar pneumonia,  Steroid taper, azithromycin and symbicort  added.       Relevant Medications   predniSONE (DELTASONE) 10 MG tablet   azithromycin (ZITHROMAX) 500 MG tablet   benzonatate (TESSALON) 200 MG capsule   budesonide-formoterol (SYMBICORT) 80-4.5 MCG/ACT inhaler   Cough   Relevant Orders   DG Chest 2 View (Completed)   Legionella Antigen, Urine (Completed)    Other Visit Diagnoses    UTI symptoms    -  Primary    Relevant Medications    azithromycin (ZITHROMAX) 500 MG tablet    Other Relevant Orders    POCT urinalysis dipstick (Completed)    Urine culture (Completed)    Urinalysis, Routine w reflex microscopic (Completed)       I am having [name] start on predniSONE, azithromycin, benzonatate, and budesonide-formoterol. I am also having her maintain her sodium chloride, NEEDLE (DISP) 23 G, doxepin, vitamin C, OVER THE COUNTER MEDICATION, cyanocobalamin, losartan, SE-TAN PLUS, CRESTOR, isosorbide mononitrate, CLARINEX, benzonatate, amLODipine, escitalopram, Prenatal Vit-Fe Fumarate-FA (PRENATAL 1 PLUS 1 PO), levothyroxine, lidocaine, montelukast, pyridOXINE, Coenzyme Q10, ergocalciferol, LOVAZA, fluconazole, diazepam, AMBIEN, oxyCODONE-acetaminophen, and polyethylene glycol powder.  Meds ordered this encounter  Medications  . predniSONE (DELTASONE) 10 MG tablet    Sig: 6 tablets on Day 1 , then reduce by 1 tablet daily until gone    Dispense:  21 tablet    Refill:  0  . azithromycin (ZITHROMAX) 500 MG tablet    Sig: Take 1 tablet (500 mg total) by mouth daily.    Dispense:  7 tablet    Refill:  0  . benzonatate (TESSALON) 200 MG capsule    Sig: Take 1 capsule (200 mg total) by mouth 3 (three) times daily as needed for cough.    Dispense:  60 capsule    Refill:  1  . budesonide-formoterol (SYMBICORT) 80-4.5 MCG/ACT inhaler    Sig: Inhale 2 puffs into the lungs 2 (two) times daily.    Dispense:  1 Inhaler  Refill:  12   A total of 40 minutes was spent with patient more than half of which was spent in counseling patient  on the above mentioned issues , reviewing and explaining recent labs and imaging studies done, and coordination of care.  There are no discontinued medications.  Follow-up: No Follow-up on file.   Crecencio Mc, MD

## 2015-02-26 NOTE — Telephone Encounter (Signed)
Spoke with pt advised of MDs message.  Pt verbalized understanding and will be here at 1:30 today to be seen

## 2015-02-26 NOTE — Patient Instructions (Addendum)
Your urine test today will be repeated.  Based on Tuesday's urine culture, you received an adequate amount of antibiotic and the infection had cleared  You may use Pyridium for your urethral discomfort    For your respiratory infection:   I am  STARTING YOU ON AZITHROMYCIN and a prednisone taper and would like you to get a chest x ray today  You may use the proventil inhaler up to four times daily  I have added Symbicort 2 puffs twice daily   Use the cough capsules as well

## 2015-02-27 ENCOUNTER — Other Ambulatory Visit: Payer: Self-pay | Admitting: Internal Medicine

## 2015-02-27 ENCOUNTER — Encounter: Payer: Self-pay | Admitting: Internal Medicine

## 2015-02-28 DIAGNOSIS — J441 Chronic obstructive pulmonary disease with (acute) exacerbation: Secondary | ICD-10-CM | POA: Insufficient documentation

## 2015-02-28 DIAGNOSIS — J189 Pneumonia, unspecified organism: Secondary | ICD-10-CM | POA: Insufficient documentation

## 2015-02-28 NOTE — Assessment & Plan Note (Signed)
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 Empiric azithromycin was started but may need to change to levaquin pending legionella Ag and urine culture

## 2015-02-28 NOTE — Assessment & Plan Note (Signed)
Secondary to lobar pneumonia,  Steroid taper, azithromycin and symbicort added.

## 2015-02-28 NOTE — Assessment & Plan Note (Signed)
Recurrent , by today's UA.  Awaiting urine culture given recurrence in short time .

## 2015-03-01 ENCOUNTER — Encounter: Payer: Self-pay | Admitting: Internal Medicine

## 2015-03-01 ENCOUNTER — Telehealth: Payer: Self-pay | Admitting: *Deleted

## 2015-03-01 ENCOUNTER — Other Ambulatory Visit: Payer: Self-pay | Admitting: Internal Medicine

## 2015-03-01 ENCOUNTER — Telehealth: Payer: Self-pay | Admitting: Internal Medicine

## 2015-03-01 LAB — LEGIONELLA ANTIGEN, URINE
RESULTS - LGAGUR: NEGATIVE
Result - LGAGUR: DETECTED
Result - LGAGUR: NOT DETECTED

## 2015-03-01 LAB — URINE CULTURE

## 2015-03-01 MED ORDER — FOSFOMYCIN TROMETHAMINE 3 G PO PACK: 3.0000 g | PACK | Freq: Once | ORAL | Status: DC

## 2015-03-01 NOTE — Telephone Encounter (Signed)
Sent her mychart message on Saturday . Pneumonia confirmed

## 2015-03-01 NOTE — Telephone Encounter (Signed)
Pt called requesting Chest Xray results.  Please advise results.

## 2015-03-01 NOTE — Telephone Encounter (Signed)
Notified pt of results and advised pt of new medication sent to pharmacy for her to pick up, advised pt to continue Azithromycin until completed course

## 2015-03-02 ENCOUNTER — Telehealth: Payer: Self-pay

## 2015-03-02 ENCOUNTER — Telehealth: Payer: Self-pay | Admitting: Internal Medicine

## 2015-03-02 NOTE — Telephone Encounter (Signed)
Pt needs a f/u appointment for the pneumonia and continue taking medication until gone, per Dr. Derrel Nip.

## 2015-03-02 NOTE — Telephone Encounter (Signed)
Pt called and stated that she will be finishing her prednisone and azithromycin. Pt wanted to know is she needs to continue taking the azithromycin because she doesn't think her pneumonia has cleared up. If pt needs to continue the azithromycin that she will need a rx. Pt also want to know if she should still have her colonoscopy done at the end to of the month. Please advise pt/msn

## 2015-03-02 NOTE — Telephone Encounter (Signed)
Patient called and wanted to cancel her Colonoscopy scheduled for 03/09/15. She states that she has pneumonia and is currently being treated for this by Dr Derrel Nip. She will be seeing Dr Derrel Nip on 03/03/15. She states that she will call back to reschedule once she is feeling better. Trish in Endoscopy notified of cancellation.

## 2015-03-04 ENCOUNTER — Other Ambulatory Visit: Payer: Self-pay | Admitting: *Deleted

## 2015-03-04 ENCOUNTER — Telehealth: Payer: Self-pay | Admitting: Internal Medicine

## 2015-03-04 ENCOUNTER — Other Ambulatory Visit: Payer: Self-pay | Admitting: Internal Medicine

## 2015-03-04 ENCOUNTER — Ambulatory Visit
Admission: RE | Admit: 2015-03-04 | Discharge: 2015-03-04 | Disposition: A | Discharge status: Home / Self Care | Payer: Federal, State, Local not specified - PPO | Source: Ambulatory Visit | Attending: Internal Medicine | Admitting: Internal Medicine

## 2015-03-04 ENCOUNTER — Ambulatory Visit (INDEPENDENT_AMBULATORY_CARE_PROVIDER_SITE_OTHER): Payer: Federal, State, Local not specified - PPO | Admitting: Internal Medicine

## 2015-03-04 ENCOUNTER — Encounter: Payer: Self-pay | Admitting: Internal Medicine

## 2015-03-04 VITALS — BP 132/60 | HR 78 | Temp 98.4°F | Resp 14 | Ht 63.0 in | Wt 222.2 lb

## 2015-03-04 DIAGNOSIS — Z09 Encounter for follow-up examination after completed treatment for conditions other than malignant neoplasm: Secondary | ICD-10-CM | POA: Diagnosis not present

## 2015-03-04 DIAGNOSIS — J189 Pneumonia, unspecified organism: Secondary | ICD-10-CM

## 2015-03-04 DIAGNOSIS — I2510 Atherosclerotic heart disease of native coronary artery without angina pectoris: Secondary | ICD-10-CM

## 2015-03-04 DIAGNOSIS — D638 Anemia in other chronic diseases classified elsewhere: Secondary | ICD-10-CM

## 2015-03-04 MED ORDER — NYSTATIN 100000 UNIT/ML MT SUSP: 500000.0000 [IU] | Freq: Four times a day (QID) | OROMUCOSAL | Status: DC

## 2015-03-04 MED ORDER — METHYLPREDNISOLONE ACETATE 80 MG/ML IJ SUSP
40.0000 mg | Freq: Once | INTRAMUSCULAR | Status: AC
  Administered 2015-03-04: 40 mg via INTRAMUSCULAR

## 2015-03-04 MED ORDER — PREDNISONE 10 MG PO TABS
ORAL_TABLET | ORAL | Status: DC
Start: 2015-03-04 — End: 2015-03-16

## 2015-03-04 MED ORDER — HYDROCOD POLST-CPM POLST ER 10-8 MG/5ML PO SUER: 5.0000 mL | Freq: Every evening | ORAL | Status: DC | PRN

## 2015-03-04 MED ORDER — CYANOCOBALAMIN 1000 MCG/ML IJ SOLN
1000.0000 ug | Freq: Once | INTRAMUSCULAR | Status: AC
  Administered 2015-03-04: 1000 ug via INTRAMUSCULAR

## 2015-03-04 NOTE — Progress Notes (Signed)
Subjective:  Patient ID: [name], female    DOB: [birth year]  Age: 74 y.o. MRN: [id number]  CC: The primary encounter diagnosis was CAP (community acquired pneumonia). A diagnosis of Anemia in other chronic diseases classified elsewhere was also pertinent to this visit.  HPI [name] presents for follow up on RLL pneumonia diagnoseed July 8 and treated with azithromycin 500 mg daily x 7 days, Symbicort MDI and and steroid taper for COPD exacerbation .  Patient returns today requesting continuation of antibiotics because she has not    uti resovled. With one dose of fosfomycin.    She is requesting a B12 injection . She has a history of B12 deficiency anemia  Outpatient Prescriptions Prior to Visit  Medication Sig Dispense Refill  . AMBIEN 5 MG tablet TAKE 1 TABLET BY MOUTH AT BEDTIME AS NEEDED FOR SLEEP 30 tablet 5  . amLODipine (NORVASC) 10 MG tablet Take 10 mg by mouth daily.    . Ascorbic Acid (VITAMIN C) 1000 MG tablet Take 500 mg by mouth daily.     . benzonatate (TESSALON) 100 MG capsule TAKE 1 CAPSULE (100 MG TOTAL) BY MOUTH 2 (TWO) TIMES DAILY. 60 capsule 1  . benzonatate (TESSALON) 200 MG capsule Take 1 capsule (200 mg total) by mouth 3 (three) times daily as needed for cough. 60 capsule 1  . budesonide-formoterol (SYMBICORT) 80-4.5 MCG/ACT inhaler Inhale 2 puffs into the lungs 2 (two) times daily. 1 Inhaler 12  . CLARINEX 5 MG tablet TAKE 1 TABLET (5 MG TOTAL) BY MOUTH EVERY MORNING. 90 tablet 1  . Coenzyme Q10 100 MG TABS Take 1 tablet by mouth daily.    . CRESTOR 20 MG tablet TAKE 1 TABLET (20 MG TOTAL) BY MOUTH DAILY. 30 tablet 5  . cyanocobalamin (,VITAMIN B-12,) 1000 MCG/ML injection For use with monthly B12 injections 10 mL 2  . diazepam (VALIUM) 5 MG tablet Take 1 tablet (5 mg total) by mouth 2 (two) times daily. As needed for anxiety or muscle spasm 180 tablet 0  . doxepin (SINEQUAN) 25 MG capsule Take 2 capsules (50 mg total) by mouth at bedtime. 30 capsule 5  .  ergocalciferol (VITAMIN D2) 50000 UNITS capsule Take 50,000 Units by mouth once a week. Wednesday    . escitalopram (LEXAPRO) 10 MG tablet Take 10 mg by mouth daily.    Marland Kitchen FeFum-FePo-FA-B Cmp-C-Zn-Mn-Cu (SE-TAN PLUS) 162-115.2-1 MG CAPS TAKE 1 CAPSULE BY MOUTH DAILY. 30 capsule 5  . fluconazole (DIFLUCAN) 150 MG tablet Take 1 tablet (150 mg total) by mouth daily. 2 tablet 1  . fosfomycin (MONUROL) 3 G PACK Take 3 g by mouth once. 3 packet 0  . isosorbide mononitrate (IMDUR) 60 MG 24 hr tablet TAKE 1 TABLET (60 MG TOTAL) BY MOUTH DAILY. 90 tablet 2  . levothyroxine (SYNTHROID, LEVOTHROID) 150 MCG tablet Take 150 mcg by mouth daily before breakfast.    . lidocaine (LIDODERM) 5 % Place 3 patches onto the skin daily. Remove & Discard patch within 12 hours or as directed by MD    . losartan (COZAAR) 100 MG tablet Take 1 tablet (100 mg total) by mouth daily. 90 tablet 2  . LOVAZA 1 G capsule TAKE 2 CAPSULES (2 G TOTAL) BY MOUTH 2 (TWO) TIMES DAILY. 360 capsule 1  . montelukast (SINGULAIR) 10 MG tablet Take 10 mg by mouth at bedtime.    Marland Kitchen NEEDLE, DISP, 23 G (BD DISP NEEDLE) 23G X 1" MISC Uses to inject b12  into muscle once monthly 12 each 1  . NORVASC 10 MG tablet TAKE 1 TABLET (10 MG TOTAL) BY MOUTH DAILY. 30 tablet 5  . OVER THE COUNTER MEDICATION Take 1 capsule by mouth daily. Florify    . oxyCODONE-acetaminophen (PERCOCET) 10-325 MG per tablet Take 1 tablet by mouth every 8 (eight) hours as needed for pain. May refill on or after April 26 2015 90 tablet 0  . polyethylene glycol powder (GLYCOLAX/MIRALAX) powder 255 grams one bottle for colonoscopy prep 255 g 0  . predniSONE (DELTASONE) 10 MG tablet 6 tablets on Day 1 , then reduce by 1 tablet daily until gone 21 tablet 0  . Prenatal Vit-Fe Fumarate-FA (PRENATAL 1 PLUS 1 PO) Take 1 tablet by mouth daily.    Marland Kitchen pyridOXINE (VITAMIN B-6) 100 MG tablet Take 100 mg by mouth daily.    . sodium chloride (OCEAN) 0.65 % nasal spray Place 2 sprays into the nose  as needed. Allergies      . azithromycin (ZITHROMAX) 500 MG tablet Take 1 tablet (500 mg total) by mouth daily. (Patient not taking: Reported on 03/04/2015) 7 tablet 0   Facility-Administered Medications Prior to Visit  Medication Dose Route Frequency Provider Last Rate Last Dose  . chlorhexidine (HIBICLENS) 4 % liquid 4 application  60 mL Topical Once Gaynelle Arabian, MD        Review of Systems;  Patient denies headache, fevers, malaise, unintentional weight loss, skin rash, eye pain, sinus congestion and sinus pain, sore throat, dysphagia,  hemoptysis , cough, dyspnea, wheezing, chest pain, palpitations, orthopnea, edema, abdominal pain, nausea, melena, diarrhea, constipation, flank pain, dysuria, hematuria, urinary  Frequency, nocturia, numbness, tingling, seizures,  Focal weakness, Loss of consciousness,  Tremor, insomnia, depression, anxiety, and suicidal ideation.      Objective:  BP 132/60 mmHg  Pulse 78  Temp(Src) 98.4 F (36.9 C) (Oral)  Resp 14  Ht 5\' 3"  (1.6 m)  Wt 222 lb 4 oz (100.812 kg)  BMI 39.38 kg/m2  SpO2 97%  BP Readings from Last 3 Encounters:  03/04/15 132/60  02/26/15 132/74  02/24/15 130/76    Wt Readings from Last 3 Encounters:  03/04/15 222 lb 4 oz (100.812 kg)  02/24/15 221 lb (100.245 kg)  02/12/15 219 lb 12 oz (99.678 kg)    General appearance: alert, cooperative and appears stated age Ears: normal TM's and external ear canals both ears Throat: lips, mucosa, and tongue normal; teeth and gums normal Neck: no adenopathy, no carotid bruit, supple, symmetrical, trachea midline and thyroid not enlarged, symmetric, no tenderness/mass/nodules Back: symmetric, no curvature. ROM normal. No CVA tenderness. Lungs: clear to auscultation bilaterally Heart: regular rate and rhythm, S1, S2 normal, no murmur, click, rub or gallop Abdomen: soft, non-tender; bowel sounds normal; no masses,  no organomegaly Pulses: 2+ and symmetric Skin: Skin color, texture,  turgor normal. No rashes or lesions Lymph nodes: Cervical, supraclavicular, and axillary nodes normal.  Lab Results  Component Value Date   HGBA1C 4.5* 04/04/2013   HGBA1C 5.8 04/18/2011    Lab Results  Component Value Date   CREATININE 1.31* 02/24/2015   CREATININE 1.31* 02/24/2015   CREATININE 1.31* 02/24/2015    Lab Results  Component Value Date   WBC 11.5* 01/04/2015   HGB 12.6 01/04/2015   HCT 37.4 01/04/2015   PLT 171.0 01/04/2015   GLUCOSE 121* 02/24/2015   GLUCOSE 121* 02/24/2015   GLUCOSE 121* 02/24/2015   CHOL 136 02/24/2015   TRIG 134.0 02/24/2015   HDL  43.60 02/24/2015   LDLDIRECT 64.7 01/01/2012   LDLCALC 66 02/24/2015   ALT 18 02/24/2015   AST 17 02/24/2015   NA 142 02/24/2015   NA 142 02/24/2015   NA 142 02/24/2015   K 4.1 02/24/2015   K 4.1 02/24/2015   K 4.1 02/24/2015   CL 107 02/24/2015   CL 107 02/24/2015   CL 107 02/24/2015   CREATININE 1.31* 02/24/2015   CREATININE 1.31* 02/24/2015   CREATININE 1.31* 02/24/2015   BUN 27* 02/24/2015   BUN 27* 02/24/2015   BUN 27* 02/24/2015   CO2 25 02/24/2015   CO2 25 02/24/2015   CO2 25 02/24/2015   TSH 0.79 08/20/2014   INR 1.03 11/28/2013   HGBA1C 4.5* 04/04/2013   MICROALBUR 1.1 04/04/2013    Dg Chest 2 View  02/26/2015   CLINICAL DATA:  Cough.  EXAM: CHEST  2 VIEW  COMPARISON:  None.  FINDINGS: Mediastinum and hilar structures are normal. Right lower lobe infiltrate consistent pneumonia. Left base subsegmental atelectasis. Heart size is normal. Degenerative changes thoracolumbar spine with scoliosis. Prior lumbar spine fusion.  IMPRESSION: 1.  Right base mild infiltrate consistent with pneumonia.  2. Mild bibasilar subsegmental atelectasis.   Electronically Signed   By: Marcello Moores  Register   On: 02/26/2015 14:47    Assessment & Plan:   Problem List Items Addressed This Visit      Unprioritized   CAP (community acquired pneumonia) - Primary    She is still wheezing and mildy hypoxic with  ambulation with sats dropping to 90%.   ,  Repeat chest x ray wasdone and showed resolved LLL infiltrate.  Reassured patient that she does not need another round of abx since sh  is not febrile or having purulent sputum,,  But continue prednisone taper foand inhlaed dilators/steroids for asthma component.       Relevant Medications   chlorpheniramine-HYDROcodone (TUSSIONEX PENNKINETIC ER) 10-8 MG/5ML SUER   methylPREDNISolone acetate (DEPO-MEDROL) injection 40 mg (Completed)   Other Relevant Orders   DG Chest 2 View (Completed)   Anemia   Relevant Medications   cyanocobalamin ((VITAMIN B-12)) injection 1,000 mcg (Completed)      I am having Ms. Caplin start on chlorpheniramine-HYDROcodone and predniSONE. I am also having her maintain her sodium chloride, NEEDLE (DISP) 23 G, doxepin, vitamin C, OVER THE COUNTER MEDICATION, cyanocobalamin, losartan, SE-TAN PLUS, CRESTOR, isosorbide mononitrate, CLARINEX, benzonatate, amLODipine, escitalopram, Prenatal Vit-Fe Fumarate-FA (PRENATAL 1 PLUS 1 PO), levothyroxine, lidocaine, montelukast, pyridOXINE, Coenzyme Q10, ergocalciferol, LOVAZA, fluconazole, diazepam, AMBIEN, oxyCODONE-acetaminophen, polyethylene glycol powder, predniSONE, azithromycin, benzonatate, budesonide-formoterol, NORVASC, and fosfomycin. We administered cyanocobalamin and methylPREDNISolone acetate.  Meds ordered this encounter  Medications  . chlorpheniramine-HYDROcodone (TUSSIONEX PENNKINETIC ER) 10-8 MG/5ML SUER    Sig: Take 5 mLs by mouth at bedtime as needed for cough.    Dispense:  140 mL    Refill:  0  . predniSONE (DELTASONE) 10 MG tablet    Sig: 6 tablets on Day 1 , then reduce by 1 tablet daily until gone    Dispense:  21 tablet    Refill:  0  . cyanocobalamin ((VITAMIN B-12)) injection 1,000 mcg    Sig:   . methylPREDNISolone acetate (DEPO-MEDROL) injection 40 mg    Sig:     There are no discontinued medications.  Follow-up: No Follow-up on file.   Crecencio Mc, MD

## 2015-03-04 NOTE — Telephone Encounter (Signed)
Pt called states she asked Juliann Pulse for Nystatin refill for thrush.  States Rx has not been sent to pharmacy.  Please advise

## 2015-03-04 NOTE — Patient Instructions (Signed)
I am repeating your chest x ray today to see if the pneumonia is worsening  If is worse,  I will call in Levaquin to your local pharmacy  Please repeat the steroid taper  I have prescribed Tussionex for your nighttime agitation and cough

## 2015-03-04 NOTE — Telephone Encounter (Signed)
Spoke with pt, advised Rx ready for pick up 

## 2015-03-04 NOTE — Telephone Encounter (Signed)
Pt seen today.  Please advise refill

## 2015-03-04 NOTE — Telephone Encounter (Signed)
Patient notified MD calling in script.

## 2015-03-04 NOTE — Telephone Encounter (Signed)
Spironolactone rx denied ,  It was adversely affecting her kidney function

## 2015-03-04 NOTE — Telephone Encounter (Signed)
rx sent

## 2015-03-04 NOTE — Telephone Encounter (Signed)
Patient would like Nystatin mouth wash swish and swallow for mouth due to ABX and prednisone she has been taking causing thrush. Forgot to mention during appointment.

## 2015-03-05 ENCOUNTER — Telehealth: Payer: Self-pay | Admitting: Internal Medicine

## 2015-03-05 NOTE — Telephone Encounter (Signed)
Patient had chest X-ray on 03/04/15 please advise of results in chart ok to tell patient pneumonia is resolved.

## 2015-03-05 NOTE — Telephone Encounter (Signed)
Patient notified and voiced understanding.

## 2015-03-05 NOTE — Telephone Encounter (Signed)
Xray showed that left lower lobe pneumonia has resolved.

## 2015-03-06 ENCOUNTER — Encounter: Payer: Self-pay | Admitting: Internal Medicine

## 2015-03-07 NOTE — Assessment & Plan Note (Signed)
She is still wheezing and mildy hypoxic with ambulation with sats dropping to 90%.   ,  Repeat chest x ray wasdone and showed resolved LLL infiltrate.  Reassured patient that she does not need another round of abx since sh  is not febrile or having purulent sputum,,  But continue prednisone taper foand inhlaed dilators/steroids for asthma component.

## 2015-03-09 ENCOUNTER — Ambulatory Visit
Admission: RE | Admit: 2015-03-09 | Payer: Federal, State, Local not specified - PPO | Source: Ambulatory Visit | Admitting: General Surgery

## 2015-03-09 ENCOUNTER — Encounter: Admission: RE | Payer: Self-pay | Source: Ambulatory Visit

## 2015-03-09 SURGERY — COLONOSCOPY WITH PROPOFOL
Anesthesia: General

## 2015-03-10 ENCOUNTER — Other Ambulatory Visit: Payer: Self-pay | Admitting: Internal Medicine

## 2015-03-10 NOTE — Telephone Encounter (Signed)
Will Refill one time only,  Needs to  Be seen if persistent thrush

## 2015-03-10 NOTE — Telephone Encounter (Signed)
Last refill of 162mL 7.14.16.  Please advise refill

## 2015-03-11 ENCOUNTER — Telehealth: Payer: Self-pay | Admitting: Internal Medicine

## 2015-03-11 ENCOUNTER — Telehealth: Payer: Self-pay | Admitting: *Deleted

## 2015-03-11 NOTE — Telephone Encounter (Signed)
Pt called on 7.20.16, left message, states the pneumonia has not resolved.  I called pt this morning to request whether she wanted to be re evaluated.  Pt states she feels better today and she did not want to be re evaluated.  Pt states she will call the first of the week if she needs to be seen.

## 2015-03-11 NOTE — Telephone Encounter (Signed)
Fyi patient scheduled for Tuesday at 1.45 she just wants to be re- evaluated she said no hurry she just anxious to feel better . Denies wheezing or fever.

## 2015-03-11 NOTE — Telephone Encounter (Signed)
Having pain under her right scapula. Patient feels she needs to be recheck . Patient stated that this is not an emergency. Per the patient she is eating healthy ,she is eating grapes.

## 2015-03-16 ENCOUNTER — Encounter: Payer: Self-pay | Admitting: Internal Medicine

## 2015-03-16 ENCOUNTER — Ambulatory Visit (INDEPENDENT_AMBULATORY_CARE_PROVIDER_SITE_OTHER): Payer: Federal, State, Local not specified - PPO | Admitting: Internal Medicine

## 2015-03-16 VITALS — BP 108/60 | HR 75 | Temp 98.6°F | Resp 14 | Ht 63.0 in | Wt 223.2 lb

## 2015-03-16 DIAGNOSIS — I2510 Atherosclerotic heart disease of native coronary artery without angina pectoris: Secondary | ICD-10-CM

## 2015-03-16 DIAGNOSIS — J189 Pneumonia, unspecified organism: Secondary | ICD-10-CM | POA: Diagnosis not present

## 2015-03-16 NOTE — Patient Instructions (Addendum)
You have completely recovered from your pneumonia and are cleared for repeat colonoscopy  You no longer have thrush.  I would continue daily cranberry tablets to prevent urinary tract infections

## 2015-03-16 NOTE — Progress Notes (Signed)
Subjective:  Patient ID: [name], female    DOB: Jul 25, 1941  Age: 74 y.o. MRN: [id number]  CC: The encounter diagnosis was CAP (community acquired pneumonia).  HPI [name] presents for follow Up on pneumonia diagnosed and treated July 8th with azithromycin 500 mg dailyc 7 days.  She was scheduled fro repeat colonoscopy and is requesting clearance to reschedule the procedure given her history of f right sided ischemic colitis noted on prior.   Her breathing is back to normal  Despite the heat.  NO diarrhea,  Sleeping well.  ,   Worried that she stills have thrush,  She has self treated four times   Outpatient Prescriptions Prior to Visit  Medication Sig Dispense Refill  . AMBIEN 5 MG tablet TAKE 1 TABLET BY MOUTH AT BEDTIME AS NEEDED FOR SLEEP 30 tablet 5  . amLODipine (NORVASC) 10 MG tablet Take 10 mg by mouth daily.    . Ascorbic Acid (VITAMIN C) 1000 MG tablet Take 500 mg by mouth daily.     . benzonatate (TESSALON) 100 MG capsule TAKE 1 CAPSULE (100 MG TOTAL) BY MOUTH 2 (TWO) TIMES DAILY. 60 capsule 1  . benzonatate (TESSALON) 200 MG capsule Take 1 capsule (200 mg total) by mouth 3 (three) times daily as needed for cough. 60 capsule 1  . budesonide-formoterol (SYMBICORT) 80-4.5 MCG/ACT inhaler Inhale 2 puffs into the lungs 2 (two) times daily. 1 Inhaler 12  . CLARINEX 5 MG tablet TAKE 1 TABLET (5 MG TOTAL) BY MOUTH EVERY MORNING. 90 tablet 1  . Coenzyme Q10 100 MG TABS Take 1 tablet by mouth daily.    . CRESTOR 20 MG tablet TAKE 1 TABLET (20 MG TOTAL) BY MOUTH DAILY. 30 tablet 5  . cyanocobalamin (,VITAMIN B-12,) 1000 MCG/ML injection For use with monthly B12 injections 10 mL 2  . diazepam (VALIUM) 5 MG tablet Take 1 tablet (5 mg total) by mouth 2 (two) times daily. As needed for anxiety or muscle spasm 180 tablet 0  . doxepin (SINEQUAN) 25 MG capsule Take 2 capsules (50 mg total) by mouth at bedtime. 30 capsule 5  . ergocalciferol (VITAMIN D2) 50000 UNITS capsule Take 50,000  Units by mouth once a week. Wednesday    . escitalopram (LEXAPRO) 10 MG tablet Take 10 mg by mouth daily.    Marland Kitchen FeFum-FePo-FA-B Cmp-C-Zn-Mn-Cu (SE-TAN PLUS) 162-115.2-1 MG CAPS TAKE 1 CAPSULE BY MOUTH DAILY. 30 capsule 5  . fluconazole (DIFLUCAN) 150 MG tablet Take 1 tablet (150 mg total) by mouth daily. 2 tablet 1  . fosfomycin (MONUROL) 3 G PACK Take 3 g by mouth once. 3 packet 0  . isosorbide mononitrate (IMDUR) 60 MG 24 hr tablet TAKE 1 TABLET (60 MG TOTAL) BY MOUTH DAILY. 90 tablet 2  . levothyroxine (SYNTHROID, LEVOTHROID) 150 MCG tablet Take 150 mcg by mouth daily before breakfast.    . lidocaine (LIDODERM) 5 % Place 3 patches onto the skin daily. Remove & Discard patch within 12 hours or as directed by MD    . LIDODERM 5 % PLACE 3 PATCHES ONTO SKIN DAILY AND DISCARD WITH 12 HOURS OR AS DIRECTED 90 patch 5  . losartan (COZAAR) 100 MG tablet Take 1 tablet (100 mg total) by mouth daily. 90 tablet 2  . LOVAZA 1 G capsule TAKE 2 CAPSULES (2 G TOTAL) BY MOUTH 2 (TWO) TIMES DAILY. 360 capsule 1  . montelukast (SINGULAIR) 10 MG tablet Take 10 mg by mouth at bedtime.    Marland Kitchen  NEEDLE, DISP, 23 G (BD DISP NEEDLE) 23G X 1" MISC Uses to inject b12 into muscle once monthly 12 each 1  . NORVASC 10 MG tablet TAKE 1 TABLET (10 MG TOTAL) BY MOUTH DAILY. 30 tablet 5  . nystatin (MYCOSTATIN) 100000 UNIT/ML suspension TAKE 5 MLS (500,000 UNITS TOTAL) BY MOUTH 4 (FOUR) TIMES DAILY. 120 mL 0  . OVER THE COUNTER MEDICATION Take 1 capsule by mouth daily. Florify    . oxyCODONE-acetaminophen (PERCOCET) 10-325 MG per tablet Take 1 tablet by mouth every 8 (eight) hours as needed for pain. May refill on or after April 26 2015 90 tablet 0  . polyethylene glycol powder (GLYCOLAX/MIRALAX) powder 255 grams one bottle for colonoscopy prep 255 g 0  . Prenatal Vit-Fe Fumarate-FA (PRENATAL 1 PLUS 1 PO) Take 1 tablet by mouth daily.    Marland Kitchen pyridOXINE (VITAMIN B-6) 100 MG tablet Take 100 mg by mouth daily.    . sodium chloride  (OCEAN) 0.65 % nasal spray Place 2 sprays into the nose as needed. Allergies      . azithromycin (ZITHROMAX) 500 MG tablet Take 1 tablet (500 mg total) by mouth daily. (Patient not taking: Reported on 03/04/2015) 7 tablet 0  . chlorpheniramine-HYDROcodone (TUSSIONEX PENNKINETIC ER) 10-8 MG/5ML SUER Take 5 mLs by mouth at bedtime as needed for cough. 140 mL 0  . predniSONE (DELTASONE) 10 MG tablet 6 tablets on Day 1 , then reduce by 1 tablet daily until gone 21 tablet 0  . predniSONE (DELTASONE) 10 MG tablet 6 tablets on Day 1 , then reduce by 1 tablet daily until gone 21 tablet 0   Facility-Administered Medications Prior to Visit  Medication Dose Route Frequency Provider Last Rate Last Dose  . chlorhexidine (HIBICLENS) 4 % liquid 4 application  60 mL Topical Once Gaynelle Arabian, MD        Review of Systems;  Patient denies headache, fevers, malaise, unintentional weight loss, skin rash, eye pain, sinus congestion and sinus pain, sore throat, dysphagia,  hemoptysis , cough, dyspnea, wheezing, chest pain, palpitations, orthopnea, edema, abdominal pain, nausea, melena, diarrhea, constipation, flank pain, dysuria, hematuria, urinary  Frequency, nocturia, numbness, tingling, seizures,  Focal weakness, Loss of consciousness,  Tremor, insomnia, depression, anxiety, and suicidal ideation.      Objective:  BP 108/60 mmHg  Pulse 75  Temp(Src) 98.6 F (37 C) (Oral)  Resp 14  Ht 5\' 3"  (1.6 m)  Wt 223 lb 4 oz (101.266 kg)  BMI 39.56 kg/m2  SpO2 98%  BP Readings from Last 3 Encounters:  03/16/15 108/60  03/04/15 132/60  02/26/15 132/74    Wt Readings from Last 3 Encounters:  03/16/15 223 lb 4 oz (101.266 kg)  03/04/15 222 lb 4 oz (100.812 kg)  02/24/15 221 lb (100.245 kg)    General appearance: alert, cooperative and appears stated age Ears: normal TM's and external ear canals both ears Throat: lips, mucosa, and tongue normal; teeth and gums normal Neck: no adenopathy, no carotid bruit,  supple, symmetrical, trachea midline and thyroid not enlarged, symmetric, no tenderness/mass/nodules Back: symmetric, no curvature. ROM normal. No CVA tenderness. Lungs: clear to auscultation bilaterally Heart: regular rate and rhythm, S1, S2 normal, no murmur, click, rub or gallop Abdomen: soft, non-tender; bowel sounds normal; no masses,  no organomegaly Pulses: 2+ and symmetric Skin: Skin color, texture, turgor normal. No rashes or lesions Lymph nodes: Cervical, supraclavicular, and axillary nodes normal.  Lab Results  Component Value Date   HGBA1C 4.5* 04/04/2013  HGBA1C 5.8 04/18/2011    Lab Results  Component Value Date   CREATININE 1.31* 02/24/2015   CREATININE 1.31* 02/24/2015   CREATININE 1.31* 02/24/2015    Lab Results  Component Value Date   WBC 11.5* 01/04/2015   HGB 12.6 01/04/2015   HCT 37.4 01/04/2015   PLT 171.0 01/04/2015   GLUCOSE 121* 02/24/2015   GLUCOSE 121* 02/24/2015   GLUCOSE 121* 02/24/2015   CHOL 136 02/24/2015   TRIG 134.0 02/24/2015   HDL 43.60 02/24/2015   LDLDIRECT 64.7 01/01/2012   LDLCALC 66 02/24/2015   ALT 18 02/24/2015   AST 17 02/24/2015   NA 142 02/24/2015   NA 142 02/24/2015   NA 142 02/24/2015   K 4.1 02/24/2015   K 4.1 02/24/2015   K 4.1 02/24/2015   CL 107 02/24/2015   CL 107 02/24/2015   CL 107 02/24/2015   CREATININE 1.31* 02/24/2015   CREATININE 1.31* 02/24/2015   CREATININE 1.31* 02/24/2015   BUN 27* 02/24/2015   BUN 27* 02/24/2015   BUN 27* 02/24/2015   CO2 25 02/24/2015   CO2 25 02/24/2015   CO2 25 02/24/2015   TSH 0.79 08/20/2014   INR 1.03 11/28/2013   HGBA1C 4.5* 04/04/2013   MICROALBUR 1.1 04/04/2013    Dg Chest 2 View  03/04/2015   CLINICAL DATA:  Follow up RLL PNA, wheezing  EXAM: CHEST  2 VIEW  COMPARISON:  02/26/2015  FINDINGS: Opacity noted in the posterior right lower lobe on the prior study has essentially resolved. There is some residual linear and reticular lung base opacity most consistent  with a combination of subsegmental atelectasis and scarring. There are no new areas of lung opacity. Lungs are mildly hyperexpanded. No pleural effusion or pneumothorax.  Cardiac silhouette is normal in size. Normal mediastinal and hilar contours.  Bony thorax is demineralized. Orthopedic hardware from a lumbar spine posterior fusion is partly imaged.  IMPRESSION: 1. Resolved left lower lobe pneumonia. 2. No acute cardiopulmonary disease.   Electronically Signed   By: Lajean Manes M.D.   On: 03/04/2015 15:31    Assessment & Plan:   Problem List Items Addressed This Visit      Unprioritized   CAP (community acquired pneumonia) - Primary    resolved clinically and by repeat chest x ray .  She is cleared to have follow up colonoscopy.          I have discontinued Ms. Chittum's predniSONE, azithromycin, chlorpheniramine-HYDROcodone, and predniSONE. I am also having her maintain her sodium chloride, NEEDLE (DISP) 23 G, doxepin, vitamin C, OVER THE COUNTER MEDICATION, cyanocobalamin, losartan, SE-TAN PLUS, CRESTOR, isosorbide mononitrate, CLARINEX, benzonatate, amLODipine, escitalopram, Prenatal Vit-Fe Fumarate-FA (PRENATAL 1 PLUS 1 PO), levothyroxine, lidocaine, montelukast, pyridOXINE, Coenzyme Q10, ergocalciferol, LOVAZA, fluconazole, diazepam, AMBIEN, oxyCODONE-acetaminophen, polyethylene glycol powder, benzonatate, budesonide-formoterol, NORVASC, fosfomycin, LIDODERM, and nystatin.  No orders of the defined types were placed in this encounter.    Medications Discontinued During This Encounter  Medication Reason  . azithromycin (ZITHROMAX) 500 MG tablet Completed Course  . chlorpheniramine-HYDROcodone (TUSSIONEX PENNKINETIC ER) 10-8 MG/5ML SUER Patient Preference  . predniSONE (DELTASONE) 10 MG tablet Error  . predniSONE (DELTASONE) 10 MG tablet Error    Follow-up: Return in about 3 months (around 06/16/2015).   Crecencio Mc, MD

## 2015-03-16 NOTE — Progress Notes (Signed)
Pre-visit discussion using our clinic review tool. No additional management support is needed unless otherwise documented below in the visit note.  

## 2015-03-17 ENCOUNTER — Telehealth: Payer: Self-pay | Admitting: *Deleted

## 2015-03-17 NOTE — Telephone Encounter (Signed)
Patient has been scheduled for a colonoscopy on 03-24-15 at [facility].

## 2015-03-18 NOTE — Assessment & Plan Note (Addendum)
resolved clinically and by repeat chest x ray .  She is cleared to have follow up colonoscopy.

## 2015-03-22 ENCOUNTER — Other Ambulatory Visit: Payer: Self-pay | Admitting: Internal Medicine

## 2015-03-23 DIAGNOSIS — K633 Ulcer of intestine: Secondary | ICD-10-CM | POA: Diagnosis not present

## 2015-03-23 DIAGNOSIS — Z1211 Encounter for screening for malignant neoplasm of colon: Secondary | ICD-10-CM | POA: Diagnosis present

## 2015-03-23 DIAGNOSIS — I10 Essential (primary) hypertension: Secondary | ICD-10-CM | POA: Diagnosis not present

## 2015-03-23 NOTE — Anesthesia Preprocedure Evaluation (Addendum)
Anesthesia Evaluation    Reviewed: Allergy & Precautions, H&P , NPO status , Patient's Chart, lab work & pertinent test results  History of Anesthesia Complications (+) history of anesthetic complications (? reaction to propofol)  Airway Mallampati: III  TM Distance: >3 FB Neck ROM: limited    Dental  (+) Partial Upper, Poor Dentition   Pulmonary shortness of breath and with exertion, asthma , sleep apnea, Continuous Positive Airway Pressure Ventilation and Oxygen sleep apnea , pneumonia -, resolved, COPD breath sounds clear to auscultation  + decreased breath sounds      Cardiovascular Exercise Tolerance: Poor hypertension, Pt. on medications + CAD, + Peripheral Vascular Disease and +CHF (history after propofol dosing) - Past MI and - CABG Normal cardiovascular exam+ dysrhythmias + Valvular Problems/Murmurs Rhythm:regular Rate:Normal     Neuro/Psych  Headaches (none in a long time), negative psych ROS   GI/Hepatic negative GI ROS, Neg liver ROS,   Endo/Other  Hypothyroidism   Renal/GU Renal disease  negative genitourinary   Musculoskeletal   Abdominal   Peds  Hematology negative hematology ROS (+) anemia ,   Anesthesia Other Findings Past Medical History:   Degenerative disc disease                                      Comment:HERNITAED DISC BY MRI ON 07/07;S/P               LAMINECTOMY,RID REPLACEMENT   Brain aneurysm                                                 Comment:S/P RESECTION   Discoid lupus erythematosus eyelid                             Comment:NEGATIVE SEROLOGIC MARKERS   Hypercholesterolemia                                         Chronic sinusitis                                            Hypertension                                                 Dysrhythmia                                                    Comment:PVC's occ.   Pneumonia                                       05/30/2010  Comment:after receiving Propofol   Headache(784.0)  Comment:migraines occ.   CHF (congestive heart failure)                  05/2010        Comment:after receiving Propofol   Hypothyroidism                                                 Comment:takes Synthroid   Blood transfusion                               2009           Comment:back surgery   Asthma                                          06/2010        Comment:depending on what around   Complication of anesthesia                      05/30/2010     Comment:from colonoscopy-had Propofol-had a   Complication of anesthesia                                     Comment:reaction-went into asthma-pneumonia,-   Complication of anesthesia                                     Comment:then CHF and to ICU   Sleep apnea                                                    Comment:uses CPAP-8.4-oxygen suppliment   Renal insufficiency                                            Comment:Patient states she has been told she has Stage               I kidney disease   Reproductive/Obstetrics negative OB ROS                           Anesthesia Physical  Anesthesia Plan  ASA: III  Anesthesia Plan: General   Post-op Pain Management:    Induction: Intravenous  Airway Management Planned: Nasal Cannula  Additional Equipment:   Intra-op Plan:   Post-operative Plan:   Informed Consent: I have reviewed the patients History and Physical, chart, labs and discussed the procedure including the risks, benefits and alternatives for the proposed anesthesia with the patient or authorized representative who has indicated his/her understanding and acceptance.   Dental Advisory Given  Plan Discussed with: Anesthesiologist, Surgeon and CRNA  Anesthesia Plan Comments:        Anesthesia Quick Evaluation

## 2015-03-24 ENCOUNTER — Ambulatory Visit: Payer: Federal, State, Local not specified - PPO | Admitting: Anesthesiology

## 2015-03-24 ENCOUNTER — Encounter
Admission: RE | Disposition: A | Discharge status: Home / Self Care | Payer: Self-pay | Source: Ambulatory Visit | Attending: General Surgery

## 2015-03-24 ENCOUNTER — Encounter: Payer: Self-pay | Admitting: *Deleted

## 2015-03-24 ENCOUNTER — Ambulatory Visit
Admission: RE | Admit: 2015-03-24 | Discharge: 2015-03-24 | Disposition: A | Discharge status: Home / Self Care | Payer: Federal, State, Local not specified - PPO | Source: Ambulatory Visit | Attending: General Surgery | Admitting: General Surgery

## 2015-03-24 DIAGNOSIS — I10 Essential (primary) hypertension: Secondary | ICD-10-CM | POA: Insufficient documentation

## 2015-03-24 DIAGNOSIS — Z1211 Encounter for screening for malignant neoplasm of colon: Secondary | ICD-10-CM | POA: Insufficient documentation

## 2015-03-24 DIAGNOSIS — K633 Ulcer of intestine: Secondary | ICD-10-CM | POA: Diagnosis not present

## 2015-03-24 HISTORY — PX: COLONOSCOPY WITH PROPOFOL: SHX5780

## 2015-03-24 SURGERY — COLONOSCOPY WITH PROPOFOL
Anesthesia: General

## 2015-03-24 MED ORDER — MIDAZOLAM HCL 2 MG/2ML IJ SOLN
INTRAMUSCULAR | Status: DC | PRN
  Administered 2015-03-24: 2 mg via INTRAVENOUS

## 2015-03-24 MED ORDER — ETOMIDATE 2 MG/ML IV SOLN
INTRAVENOUS | Status: DC | PRN
  Administered 2015-03-24: 6 mg via INTRAVENOUS
  Administered 2015-03-24: 8 mg via INTRAVENOUS
  Administered 2015-03-24 (×2): 6 mg via INTRAVENOUS
  Administered 2015-03-24: 8 mg via INTRAVENOUS
  Administered 2015-03-24: 6 mg via INTRAVENOUS
  Administered 2015-03-24: 12 mg via INTRAVENOUS
  Administered 2015-03-24: 14 mg via INTRAVENOUS

## 2015-03-24 MED ORDER — LACTATED RINGERS IV SOLN
INTRAVENOUS | Status: DC
  Administered 2015-03-24: 10:00:00 via INTRAVENOUS

## 2015-03-24 MED ORDER — FENTANYL CITRATE (PF) 100 MCG/2ML IJ SOLN
INTRAMUSCULAR | Status: DC | PRN
  Administered 2015-03-24 (×2): 50 ug via INTRAVENOUS

## 2015-03-24 MED ORDER — ONDANSETRON HCL 4 MG/2ML IJ SOLN
INTRAMUSCULAR | Status: DC | PRN
  Administered 2015-03-24: 4 mg via INTRAVENOUS

## 2015-03-24 MED ORDER — LIDOCAINE HCL (CARDIAC) 20 MG/ML IV SOLN
INTRAVENOUS | Status: DC | PRN
  Administered 2015-03-24: 20 mg via INTRAVENOUS

## 2015-03-24 NOTE — H&P (View-Only) (Signed)
Patient ID: [name], female   DOB: Jun 22, 1941, 74 y.o.   MRN: [id number]  Chief Complaint  Patient presents with  . Follow-up    visceral Angiography    HPI [name] is a 74 y.o. female here today for her follow up Visceral Angiography and stent placement of SMA done on 01/25/15. No current abdominal pain or bleeding    HPI  Past Medical History  Diagnosis Date  . Degenerative disc disease     HERNITAED DISC BY MRI ON 07/07;S/P LAMINECTOMY,RID REPLACEMENT  . Brain aneurysm     S/P RESECTION  . Discoid lupus erythematosus eyelid     NEGATIVE SEROLOGIC MARKERS  . Hypercholesterolemia   . Chronic sinusitis   . Hypertension   . Dysrhythmia     PVC's occ.  . Pneumonia 05/30/2010    after receiving Propofol  . Headache(784.0)     migraines occ.  . CHF (congestive heart failure) 05/2010    after receiving Propofol  . Hypothyroidism     takes Synthroid  . Blood transfusion 2009    back surgery  . Asthma 06/2010    depending on what around  . Complication of anesthesia 05/30/2010    from colonoscopy-had Propofol-had a  . Complication of anesthesia     reaction-went into asthma-pneumonia,-  . Complication of anesthesia     then CHF and to ICU  . Sleep apnea     uses CPAP-8.4-oxygen suppliment  . Renal insufficiency     Patient states she has been told she has Stage I kidney disease    Past Surgical History  Procedure Laterality Date  . Cosmetic surgery    . Tonsillectomy  1963  . Back surgery  1968,1988,2002,2009    4 laminectomies and fusions  . Abdominal hysterectomy  1988    uterus only  . Brain surgery  2005    coiling of cranial aneurysum  . Excision morton's neuroma  1990    left foot  . Nasal sinus surgery  1977-2008  . Lipomas  1975    3 from right breast  . Tympanic membrane repair  2009-last one    x5 times  . Total knee arthroplasty  07/10/2011    Procedure: TOTAL KNEE ARTHROPLASTY;  Surgeon: Gearlean Alf;  Location: [facility];  Service:  Orthopedics;  Laterality: Right;  . Joint replacement  2013    Right knee,  [place]  . Eye surgery      both cataracts -[place] 7/14  . Foot arthrodesis Right 05/22/2013    Procedure: RIGHT HALLUX METATARSAL PHALANGEAL JOINT ARTHRODESIS ;  Surgeon: Wylene Simmer, MD;  Location: [place] [facility];  Service: Orthopedics;  Laterality: Right;  . Right heart catheterization N/A 11/28/2013    Procedure: RIGHT HEART CATH;  Surgeon: Jolaine Artist, MD;  Location: [facility];  Service: Cardiovascular;  Laterality: N/A;  . Colonoscopy  2008  . Colonoscopy with propofol N/A 01/13/2015    Procedure: COLONOSCOPY WITH PROPOFOL;  Surgeon: Christene Lye, MD;  Location: [facility] END[facility];  Service: Endoscopy;  Laterality: N/A;  . Peripheral vascular catheterization N/A 01/25/2015    Procedure: Visceral Angiography;  Surgeon: Algernon Huxley, MD;  Location: [place] [facility];  Service: Cardiovascular;  Laterality: N/A;    Family History  Problem Relation Age of Onset  . Heart disease Mother   . Stroke Mother   . Alcohol abuse Other   . Cancer Sister     leukemia  . Cancer Brother  lung Ca, asbestos    Social History History  Substance Use Topics  . Smoking status: Never Smoker   . Smokeless tobacco: Never Used     Comment: REMOTELY QUIT  TOBACCO USE  . Alcohol Use: Yes     Comment: occasional    Allergies  Allergen Reactions  . Penicillins Anaphylaxis    Septra ANTI-INFECTIVE AGENTS-MISC..WELTS,ITCHING  . Penicillins Hives  . Propofol Other (See Comments)    O2 drops   . Propranolol     chf  . Triamcinolone Acetonide Hives  . Adhesive [Tape] Rash  . Kenalog [Triamcinolone Acetonide] Itching and Rash  . Septra [Bactrim] Swelling and Rash  . Sulfur Rash    Current Outpatient Prescriptions  Medication Sig Dispense Refill  . AMBIEN 5 MG tablet TAKE 1 TABLET BY MOUTH AT BEDTIME AS NEEDED FOR SLEEP 30 tablet 5  . amLODipine (NORVASC) 10 MG tablet Take 10 mg by mouth daily.     . Ascorbic Acid (VITAMIN C) 1000 MG tablet Take 500 mg by mouth daily.     . benzonatate (TESSALON) 100 MG capsule TAKE 1 CAPSULE (100 MG TOTAL) BY MOUTH 2 (TWO) TIMES DAILY. 60 capsule 1  . CLARINEX 5 MG tablet TAKE 1 TABLET (5 MG TOTAL) BY MOUTH EVERY MORNING. 90 tablet 1  . Coenzyme Q10 100 MG TABS Take 1 tablet by mouth daily.    . CRESTOR 20 MG tablet TAKE 1 TABLET (20 MG TOTAL) BY MOUTH DAILY. 30 tablet 5  . cyanocobalamin (,VITAMIN B-12,) 1000 MCG/ML injection For use with monthly B12 injections 10 mL 2  . diazepam (VALIUM) 5 MG tablet Take 1 tablet (5 mg total) by mouth 2 (two) times daily. As needed for anxiety or muscle spasm 180 tablet 0  . doxepin (SINEQUAN) 25 MG capsule Take 2 capsules (50 mg total) by mouth at bedtime. 30 capsule 5  . ergocalciferol (VITAMIN D2) 50000 UNITS capsule Take 50,000 Units by mouth once a week. Wednesday    . escitalopram (LEXAPRO) 10 MG tablet Take 10 mg by mouth daily.    Marland Kitchen FeFum-FePo-FA-B Cmp-C-Zn-Mn-Cu (SE-TAN PLUS) 162-115.2-1 MG CAPS TAKE 1 CAPSULE BY MOUTH DAILY. 30 capsule 5  . fluconazole (DIFLUCAN) 150 MG tablet Take 1 tablet (150 mg total) by mouth daily. 2 tablet 1  . isosorbide mononitrate (IMDUR) 60 MG 24 hr tablet TAKE 1 TABLET (60 MG TOTAL) BY MOUTH DAILY. 90 tablet 2  . levothyroxine (SYNTHROID, LEVOTHROID) 150 MCG tablet Take 150 mcg by mouth daily before breakfast.    . lidocaine (LIDODERM) 5 % Place 3 patches onto the skin daily. Remove & Discard patch within 12 hours or as directed by MD    . losartan (COZAAR) 100 MG tablet Take 1 tablet (100 mg total) by mouth daily. 90 tablet 2  . LOVAZA 1 G capsule TAKE 2 CAPSULES (2 G TOTAL) BY MOUTH 2 (TWO) TIMES DAILY. 360 capsule 1  . montelukast (SINGULAIR) 10 MG tablet Take 10 mg by mouth at bedtime.    Marland Kitchen NEEDLE, DISP, 23 G (BD DISP NEEDLE) 23G X 1" MISC Uses to inject b12 into muscle once monthly 12 each 1  . OVER THE COUNTER MEDICATION Take 1 capsule by mouth daily. Florify    .  oxyCODONE-acetaminophen (PERCOCET) 10-325 MG per tablet Take 1 tablet by mouth every 8 (eight) hours as needed for pain. May refill on or after April 26 2015 90 tablet 0  . Prenatal Vit-Fe Fumarate-FA (PRENATAL 1 PLUS 1 PO) Take 1  tablet by mouth daily.    Marland Kitchen pyridOXINE (VITAMIN B-6) 100 MG tablet Take 100 mg by mouth daily.    . sodium chloride (OCEAN) 0.65 % nasal spray Place 2 sprays into the nose as needed. Allergies      . polyethylene glycol powder (GLYCOLAX/MIRALAX) powder 255 grams one bottle for colonoscopy prep 255 g 0   No current facility-administered medications for this visit.   Facility-Administered Medications Ordered in Other Visits  Medication Dose Route Frequency Provider Last Rate Last Dose  . chlorhexidine (HIBICLENS) 4 % liquid 4 application  60 mL Topical Once Gaynelle Arabian, MD        Review of Systems Review of Systems  Constitutional: Negative.   Respiratory: Positive for cough.   Cardiovascular: Positive for leg swelling.  Gastrointestinal: Negative.     Blood pressure 130/76, pulse 76, resp. rate 14, height 5\' 3"  (1.6 m), weight 221 lb (100.245 kg).  Physical Exam Physical Exam  Constitutional: She is oriented to person, place, and time. She appears well-developed and well-nourished.  Eyes: Conjunctivae are normal. No scleral icterus.  Neck: Neck supple.  Cardiovascular: Normal rate, regular rhythm and normal heart sounds.   Pulmonary/Chest: Effort normal. She has wheezes (scattered bilaterally).  Abdominal: Soft. Normal appearance and bowel sounds are normal. There is no tenderness.  Lymphadenopathy:    She has no cervical adenopathy.  Neurological: She is alert and oriented to person, place, and time.  Skin: Skin is warm and dry.  Psychiatric: She has a normal mood and affect.    Data Reviewed Prior notes  Assessment    S/p visceral angiography with stent placement of SMA. Ischemic ulcer of cecum.    Plan for follow up colonoscopy to assess  the ulcer in cecum  Plan    Colonoscopy to be arranged.  Patient has been scheduled for a colonoscopy on 03-09-15 at [facility].     PCP:  Deirdre Pippins 02/24/2015, 1:53 PM

## 2015-03-24 NOTE — Anesthesia Postprocedure Evaluation (Signed)
  Anesthesia Post-op Note  Patient: [name]  Procedure(s) Performed: Procedure(s): COLONOSCOPY WITH PROPOFOL (N/A)  Anesthesia type:General  Patient location: PACU  Post pain: Pain level controlled  Post assessment: Post-op Vital signs reviewed, Patient's Cardiovascular Status Stable, Respiratory Function Stable, Patent Airway and No signs of Nausea or vomiting  Post vital signs: Reviewed and stable  Last Vitals:  Filed Vitals:   03/24/15 1110  BP: 115/73  Pulse: 71  Temp:   Resp: 23    Level of consciousness: awake, alert  and patient cooperative  Complications: No apparent anesthesia complications

## 2015-03-24 NOTE — Op Note (Signed)
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 Procedure:         Pre-Anesthesia Assessment:                    - General anesthesia under the supervision of an                     anesthesiologist was determined to be medically necessary                     for this procedure based on review of the patient's                     medical history, medications, and prior anesthesia history.                    After obtaining informed consent, the colonoscope was                     passed under direct vision. Throughout the procedure, the                     patient's blood pressure, pulse, and oxygen saturations                     were monitored continuously. The Colonoscope was                     introduced through the anus and advanced to the the cecum,                     identified by the ileocecal valve. The colonoscopy was                     performed without difficulty. The patient tolerated the                     procedure well. The quality of the bowel preparation was                     good. Findings:      The perianal and digital rectal examinations were normal.      The entire examined colon appeared normal. Cecal areaa was normal.       Priorr ulcer here has heeled completely Impression:        - The entire examined colon is normal.                    - No specimens collected. Recommendation:    - Return to primary care physician as previously scheduled. Procedure  Code(s): --- Professional ---                    [phone], Colonoscopy, flexible; diagnostic, including  collection of specimen(s) by brushing or washing, when                     performed (separate procedure) CPT copyright 2014 [employer]. All rights reserved. The codes documented in this report are preliminary and upon coder review may  be revised to meet current compliance requirements. Orlie Pollen, MD 03/24/2015 10:49:35 AM This report has been signed electronically. Number of Addenda: 0 Note Initiated On: 03/24/2015 10:09 AM Scope Withdrawal Time: 0 hours 1 minute 12 seconds  Total Procedure Duration: 0 hours 30 minutes 10 seconds       [facility]

## 2015-03-24 NOTE — Telephone Encounter (Signed)
Mailed unread my chart message to patient

## 2015-03-24 NOTE — Transfer of Care (Signed)
Immediate Anesthesia Transfer of Care Note  Patient: [name]  Procedure(s) Performed: Procedure(s): COLONOSCOPY WITH PROPOFOL (N/A)  Patient Location: PACU and Endoscopy Unit  Anesthesia Type:General  Level of Consciousness: sedated  Airway & Oxygen Therapy: Patient Spontanous Breathing and Patient connected to nasal cannula oxygen  Post-op Assessment: Report given to RN and Post -op Vital signs reviewed and stable  Post vital signs: Reviewed and stable  Last Vitals:  Filed Vitals:   03/24/15 1046  BP:   Pulse:   Temp: 62.0 C    Complications: No apparent anesthesia complications

## 2015-03-24 NOTE — Interval H&P Note (Signed)
History and Physical Interval Note:  03/24/2015 8:49 AM  [name]  has presented today for surgery, with the diagnosis of ISCHEMA ULCER CECUM  The various methods of treatment have been discussed with the patient and family. After consideration of risks, benefits and other options for treatment, the patient has consented to  Procedure(s): COLONOSCOPY WITH PROPOFOL (N/A) as a surgical intervention .  The patient's history has been reviewed, patient examined, no change in status, stable for surgery.  I have reviewed the patient's chart and labs.  Questions were answered to the patient's satisfaction.     [name]

## 2015-03-25 ENCOUNTER — Other Ambulatory Visit: Payer: Self-pay | Admitting: Internal Medicine

## 2015-03-25 ENCOUNTER — Encounter: Payer: Self-pay | Admitting: General Surgery

## 2015-04-14 ENCOUNTER — Other Ambulatory Visit: Payer: Self-pay | Admitting: Internal Medicine

## 2015-04-22 ENCOUNTER — Other Ambulatory Visit: Payer: Self-pay | Admitting: Internal Medicine

## 2015-04-22 NOTE — Telephone Encounter (Signed)
Ok to refill,  Refill sent  

## 2015-04-22 NOTE — Telephone Encounter (Signed)
Last OV 7.26.16.  Please advise refill. 

## 2015-04-26 NOTE — Telephone Encounter (Signed)
Encounter closed

## 2015-04-29 ENCOUNTER — Other Ambulatory Visit: Payer: Self-pay | Admitting: Neurosurgery

## 2015-04-29 DIAGNOSIS — M48061 Spinal stenosis, lumbar region without neurogenic claudication: Secondary | ICD-10-CM

## 2015-05-01 ENCOUNTER — Other Ambulatory Visit: Payer: Self-pay | Admitting: Internal Medicine

## 2015-05-11 ENCOUNTER — Ambulatory Visit
Admission: RE | Admit: 2015-05-11 | Discharge: 2015-05-11 | Disposition: A | Discharge status: Home / Self Care | Payer: Medicare Other | Source: Ambulatory Visit | Attending: Neurosurgery | Admitting: Neurosurgery

## 2015-05-11 DIAGNOSIS — M48061 Spinal stenosis, lumbar region without neurogenic claudication: Secondary | ICD-10-CM

## 2015-05-11 MED ORDER — DIAZEPAM 5 MG PO TABS: 10.0000 mg | ORAL_TABLET | Freq: Once | ORAL | Status: AC

## 2015-05-11 MED ORDER — MEPERIDINE HCL 100 MG/ML IJ SOLN
100.0000 mg | Freq: Once | INTRAMUSCULAR | Status: AC
  Administered 2015-05-11: 100 mg via INTRAMUSCULAR

## 2015-05-11 MED ORDER — ONDANSETRON HCL 4 MG/2ML IJ SOLN: 4.0000 mg | Freq: Four times a day (QID) | INTRAMUSCULAR | Status: DC | PRN

## 2015-05-11 MED ORDER — ONDANSETRON HCL 4 MG/2ML IJ SOLN
4.0000 mg | Freq: Once | INTRAMUSCULAR | Status: AC
Start: 2015-05-11 — End: 2015-05-11
  Administered 2015-05-11: 4 mg via INTRAMUSCULAR

## 2015-05-11 MED ORDER — DIAZEPAM 5 MG PO TABS: 10.0000 mg | ORAL_TABLET | Freq: Once | ORAL | Status: DC

## 2015-05-11 MED ORDER — IOHEXOL 180 MG/ML  SOLN
15.0000 mL | Freq: Once | INTRAMUSCULAR | Status: DC | PRN
  Administered 2015-05-11: 15 mL via INTRATHECAL

## 2015-05-11 NOTE — Discharge Instructions (Signed)
Myelogram Discharge Instructions  1. Go home and rest quietly for the next 24 hours.  It is important to lie flat for the next 24 hours.  Get up only to go to the restroom.  You may lie in the bed or on a couch on your back, your stomach, your left side or your right side.  You may have one pillow under your head.  You may have pillows between your knees while you are on your side or under your knees while you are on your back.  2. DO NOT drive today.  Recline the seat as far back as it will go, while still wearing your seat belt, on the way home.  3. You may get up to go to the bathroom as needed.  You may sit up for 10 minutes to eat.  You may resume your normal diet and medications unless otherwise indicated.  Drink lots of extra fluids today and tomorrow.  4. The incidence of headache, nausea, or vomiting is about 5% (one in 20 patients).  If you develop a headache, lie flat and drink plenty of fluids until the headache goes away.  Caffeinated beverages may be helpful.  If you develop severe nausea and vomiting or a headache that does not go away with flat bed rest, call [phone].  5. You may resume normal activities after your 24 hours of bed rest is over; however, do not exert yourself strongly or do any heavy lifting tomorrow. If when you get up you have a headache when standing, go back to bed and force fluids for another 24 hours.  6. Call your physician for a follow-up appointment.  The results of your myelogram will be sent directly to your physician by the following day.  7. If you have any questions or if complications develop after you arrive home, please call [phone].  Discharge instructions have been explained to the patient.  The patient, or the person responsible for the patient, fully understands these instructions.       May resume [place] on Sept. 21, 2016, after 9:30 am.

## 2015-05-14 ENCOUNTER — Other Ambulatory Visit: Payer: Self-pay | Admitting: Internal Medicine

## 2015-05-14 NOTE — Telephone Encounter (Signed)
Last OV 6.27.16, last refill 7.26.16.  Please advise refill

## 2015-05-14 NOTE — Telephone Encounter (Signed)
  A refill for 30 days with refills,  No more 90 day refills on controlled substances .

## 2015-05-24 DIAGNOSIS — J310 Chronic rhinitis: Secondary | ICD-10-CM | POA: Insufficient documentation

## 2015-05-25 ENCOUNTER — Other Ambulatory Visit: Payer: Self-pay

## 2015-05-25 ENCOUNTER — Telehealth: Payer: Self-pay | Admitting: Internal Medicine

## 2015-05-25 NOTE — Telephone Encounter (Signed)
I have forwarded this to Dr. Derrel Nip for review.

## 2015-05-25 NOTE — Telephone Encounter (Signed)
Please advise refill? 

## 2015-05-25 NOTE — Telephone Encounter (Signed)
Pt called about her pecocet medication prescription to pick up. Pt states she can pick it up tomorrow. Thank You!

## 2015-05-26 ENCOUNTER — Telehealth: Payer: Self-pay | Admitting: *Deleted

## 2015-05-26 ENCOUNTER — Other Ambulatory Visit: Payer: Self-pay | Admitting: Internal Medicine

## 2015-05-26 MED ORDER — OXYCODONE-ACETAMINOPHEN 10-325 MG PO TABS: 1.0000 | ORAL_TABLET | Freq: Three times a day (TID) | ORAL | Status: DC | PRN

## 2015-05-26 NOTE — Telephone Encounter (Signed)
Last fill 04/26/15 ok to fill?

## 2015-05-26 NOTE — Telephone Encounter (Signed)
Ok to refill,  printed rx  

## 2015-05-26 NOTE — Telephone Encounter (Signed)
Patient has called for the second time requesting her Rx refill for her percocet-Patient stated that her husband will be here to pick this up today.

## 2015-05-26 NOTE — Telephone Encounter (Signed)
Ok to refill,  printed rx . Please make appt with patient to discuss concurrent use of valium and percxcet so we can discuss safer ALTERNATIVES

## 2015-05-26 NOTE — Telephone Encounter (Signed)
Refill faxed

## 2015-05-31 ENCOUNTER — Ambulatory Visit (INDEPENDENT_AMBULATORY_CARE_PROVIDER_SITE_OTHER): Payer: Federal, State, Local not specified - PPO | Admitting: Internal Medicine

## 2015-05-31 ENCOUNTER — Encounter: Payer: Self-pay | Admitting: Internal Medicine

## 2015-05-31 VITALS — BP 120/70 | HR 74 | Temp 98.1°F | Resp 12 | Ht 63.0 in | Wt 221.1 lb

## 2015-05-31 DIAGNOSIS — F325 Major depressive disorder, single episode, in full remission: Secondary | ICD-10-CM

## 2015-05-31 DIAGNOSIS — M549 Dorsalgia, unspecified: Secondary | ICD-10-CM

## 2015-05-31 DIAGNOSIS — E669 Obesity, unspecified: Secondary | ICD-10-CM

## 2015-05-31 DIAGNOSIS — G4701 Insomnia due to medical condition: Secondary | ICD-10-CM | POA: Diagnosis not present

## 2015-05-31 DIAGNOSIS — R233 Spontaneous ecchymoses: Secondary | ICD-10-CM

## 2015-05-31 DIAGNOSIS — Z981 Arthrodesis status: Secondary | ICD-10-CM | POA: Diagnosis not present

## 2015-05-31 DIAGNOSIS — I2510 Atherosclerotic heart disease of native coronary artery without angina pectoris: Secondary | ICD-10-CM

## 2015-05-31 DIAGNOSIS — G8929 Other chronic pain: Secondary | ICD-10-CM

## 2015-05-31 MED ORDER — BACLOFEN 10 MG PO TABS: 10.0000 mg | ORAL_TABLET | Freq: Three times a day (TID) | ORAL | Status: DC

## 2015-05-31 MED ORDER — PHENTERMINE HCL 37.5 MG PO TABS
ORAL_TABLET | ORAL | Status: DC
Start: 2015-05-31 — End: 2016-03-29

## 2015-05-31 NOTE — Patient Instructions (Addendum)
I am asking you to change the valium to baclofen for muscle spasm  Because it is safer than using valium since you are also taking oxycodone.  You can take 2 if needed,  maximum dose 20 mg    I have authorized the use of phentermine for 3 months.  Please have your vital signs checked a week after starting, and return to see me in 3 months  Take 1/2 tablet in the morning,  The second half by 2 PM if needed to avoid insomnia.  Or take the whole tablet in the morning,  Goal weight loss is  5% of your starting weight by the end of the  3 months   For you ,  Based on today's reading, is 11  lbs   Please get your BP checked after you start it .  Reduce the lexapro to 1/2 tablet daily for one week,  Then 1/2 tablet every other day for one week then stop

## 2015-05-31 NOTE — Progress Notes (Signed)
Subjective:  Patient ID: [name], female    DOB: 04-23-1941  Age: 74 y.o. MRN: [id number]  CC: The primary encounter diagnosis was Obesity (BMI 30-39.9). Diagnoses of Chronic back pain greater than 3 months duration, Insomnia secondary to chronic pain, Status post lumbar spinal fusion, Major depressive disorder with single episode, in remission ([place]), and Petechial rash were also pertinent to this visit.  HPI [name] presents for MEDICATION management of chronic low back pain with prior lumbar fusions  Since her last visit she has had worsening low back pian despite use of scheduled narcotics and underwent neurosurgical evaluation with a lumbar myelogram on Sept 20th ordered by Earle Gell.  Spinal stenosis was not seen.    She continues to lead an active life but does not exercise regularly due to back pain .  She has been using 5 mg valium an average of once daily for muscle spasm, and has no prior TRIAL OF SKELAXIN,  TIZANIDIne or BACLOFEN .  She is taking oxycodone/APAP 10/325 three times daily ,  Which is a stable dose for her,  She has gained weight and is frustrated since she does not feel that she eats poorly.   However her daily activities do involve baking and cooking and she would like to resume phentermine to help her lose weight.  She is committted to finding an exercise she can tolerate and has a home recumbent exercise bike alon gwith a treadmill. Previous trial of phentermine resulted in 30 lb wt loss before she discontinued it use.    Outpatient Prescriptions Prior to Visit  Medication Sig Dispense Refill  . AMBIEN 5 MG tablet TAKE 1 TABLET BY MOUTH AT BEDTIME AS NEEDED FOR SLEEP 30 tablet 5  . amLODipine (NORVASC) 10 MG tablet Take 10 mg by mouth daily.    . Ascorbic Acid (VITAMIN C) 1000 MG tablet Take 500 mg by mouth daily.     . benzonatate (TESSALON) 100 MG capsule TAKE 1 CAPSULE (100 MG TOTAL) BY MOUTH 2 (TWO) TIMES DAILY. 60 capsule 1  . budesonide-formoterol  (SYMBICORT) 80-4.5 MCG/ACT inhaler Inhale 2 puffs into the lungs 2 (two) times daily. 1 Inhaler 12  . CLARINEX 5 MG tablet TAKE 1 TABLET (5 MG TOTAL) BY MOUTH EVERY MORNING. 90 tablet 4  . Coenzyme Q10 100 MG TABS Take 1 tablet by mouth daily.    Marland Kitchen COZAAR 50 MG tablet TAKE 1 TABLET BY MOUTH TWICE A DAY 180 tablet 4  . CRESTOR 20 MG tablet TAKE 1 TABLET (20 MG TOTAL) BY MOUTH DAILY. 30 tablet 0  . doxepin (SINEQUAN) 25 MG capsule Take 2 capsules (50 mg total) by mouth at bedtime. 30 capsule 5  . ergocalciferol (VITAMIN D2) 50000 UNITS capsule Take 50,000 Units by mouth once a week. Wednesday    . escitalopram (LEXAPRO) 10 MG tablet TAKE 1 TABLET (10 MG TOTAL) BY MOUTH DAILY. 30 tablet 10  . FeFum-FePo-FA-B Cmp-C-Zn-Mn-Cu (SE-TAN PLUS) 162-115.2-1 MG CAPS TAKE 1 CAPSULE BY MOUTH DAILY. 30 capsule 10  . isosorbide mononitrate (IMDUR) 60 MG 24 hr tablet TAKE 1 TABLET (60 MG TOTAL) BY MOUTH DAILY. 90 tablet 4  . levothyroxine (SYNTHROID, LEVOTHROID) 150 MCG tablet Take 150 mcg by mouth daily before breakfast.    . LEXAPRO 10 MG tablet TAKE 1 TABLET (10 MG TOTAL) BY MOUTH DAILY. 30 tablet 10  . LIDODERM 5 % PLACE 3 PATCHES ONTO SKIN DAILY AND DISCARD WITH 12 HOURS OR AS DIRECTED 90 patch  5  . LOVAZA 1 G capsule TAKE 2 CAPSULES (2 G TOTAL) BY MOUTH 2 (TWO) TIMES DAILY. 360 capsule 3  . montelukast (SINGULAIR) 10 MG tablet Take 10 mg by mouth at bedtime.    Marland Kitchen NEEDLE, DISP, 23 G (BD DISP NEEDLE) 23G X 1" MISC Uses to inject b12 into muscle once monthly 12 each 1  . NORVASC 10 MG tablet TAKE 1 TABLET (10 MG TOTAL) BY MOUTH DAILY. 30 tablet 5  . nystatin (MYCOSTATIN) 100000 UNIT/ML suspension TAKE 5 MLS (500,000 UNITS TOTAL) BY MOUTH 4 (FOUR) TIMES DAILY. 120 mL 0  . OVER THE COUNTER MEDICATION Take 1 capsule by mouth daily. Florify    . oxyCODONE-acetaminophen (PERCOCET) 10-325 MG tablet Take 1 tablet by mouth every 8 (eight) hours as needed for pain. May refill on or after May 26 2015 90 tablet 0  .  polyethylene glycol powder (GLYCOLAX/MIRALAX) powder 255 grams one bottle for colonoscopy prep 255 g 0  . Prenatal Vit-Fe Fumarate-FA (PRENATAL 1 PLUS 1 PO) Take 1 tablet by mouth daily.    Marland Kitchen pyridOXINE (VITAMIN B-6) 100 MG tablet Take 100 mg by mouth daily.    . sodium chloride (OCEAN) 0.65 % nasal spray Place 2 sprays into the nose as needed. Allergies      . spironolactone (ALDACTONE) 25 MG tablet TAKE 1 TABLET EVERY DAY 30 tablet 10  . Vitamin D, Ergocalciferol, (DRISDOL) 50000 UNITS CAPS capsule TAKE 1 CAPSULE (50,000 UNITS TOTAL) BY MOUTH EVERY 7 (SEVEN) DAYS. 12 capsule 0  . FeFum-FePo-FA-B Cmp-C-Zn-Mn-Cu (SE-TAN PLUS) 162-115.2-1 MG CAPS TAKE 1 CAPSULE BY MOUTH DAILY. 30 capsule 5  . LEXAPRO 10 MG tablet TAKE 1 TABLET (10 MG TOTAL) BY MOUTH DAILY. 30 tablet 10  . lidocaine (LIDODERM) 5 % Place 3 patches onto the skin daily. Remove & Discard patch within 12 hours or as directed by MD    . LIDODERM 5 % PLACE 3 PATCHES ONTO SKIN DAILY AND DISCARD WITH 12 HOURS OR AS DIRECTED 90 patch 5  . VALIUM 5 MG tablet TAKE 1 TABLET BY MOUTH TWICE A DAY 180 tablet 0  . cyanocobalamin (,VITAMIN B-12,) 1000 MCG/ML injection For use with monthly B12 injections 10 mL 2  . benzonatate (TESSALON) 200 MG capsule TAKE 1 CAPSULE (200 MG TOTAL) BY MOUTH 3 (THREE) TIMES DAILY AS NEEDED FOR COUGH. 60 capsule 1  . CLARINEX 5 MG tablet TAKE 1 TABLET (5 MG TOTAL) BY MOUTH EVERY MORNING. 90 tablet 4  . CRESTOR 20 MG tablet TAKE 1 TABLET (20 MG TOTAL) BY MOUTH DAILY. 30 tablet 10  . CRESTOR 20 MG tablet TAKE 1 TABLET (20 MG TOTAL) BY MOUTH DAILY. 30 tablet 10  . CRESTOR 20 MG tablet TAKE 1 TABLET (20 MG TOTAL) BY MOUTH DAILY. 30 tablet 10  . doxepin (SINEQUAN) 25 MG capsule TAKE 2 CAPSULES (50 MG TOTAL) BY MOUTH AT BEDTIME. 180 capsule 0  . fluconazole (DIFLUCAN) 150 MG tablet Take 1 tablet (150 mg total) by mouth daily. (Patient not taking: Reported on 03/24/2015) 2 tablet 1  . fosfomycin (MONUROL) 3 G PACK Take 3 g by  mouth once. (Patient not taking: Reported on 03/24/2015) 3 packet 0   Facility-Administered Medications Prior to Visit  Medication Dose Route Frequency Provider Last Rate Last Dose  . chlorhexidine (HIBICLENS) 4 % liquid 4 application  60 mL Topical Once Gaynelle Arabian, MD        Review of Systems;  Patient denies headache, fevers, malaise, unintentional weight  loss, skin rash, eye pain, sinus congestion and sinus pain, sore throat, dysphagia,  hemoptysis , cough, dyspnea, wheezing, chest pain, palpitations, orthopnea, edema, abdominal pain, nausea, melena, diarrhea, constipation, flank pain, dysuria, hematuria, urinary  Frequency, nocturia, numbness, tingling, seizures,  Focal weakness, Loss of consciousness,  Tremor, insomnia, depression, anxiety, and suicidal ideation.      Objective:  BP 120/70 mmHg  Pulse 74  Temp(Src) 98.1 F (36.7 C) (Oral)  Resp 12  Ht 5\' 3"  (1.6 m)  Wt 221 lb 2 oz (100.302 kg)  BMI 39.18 kg/m2  SpO2 96%  BP Readings from Last 3 Encounters:  05/31/15 120/70  05/11/15 154/62  03/24/15 115/73    Wt Readings from Last 3 Encounters:  05/31/15 221 lb 2 oz (100.302 kg)  03/16/15 223 lb 4 oz (101.266 kg)  03/04/15 222 lb 4 oz (100.812 kg)    General appearance: alert, cooperative and appears stated age Neck: no adenopathy, no carotid bruit, supple, symmetrical, trachea midline and thyroid not enlarged, symmetric, no tenderness/mass/nodules Back: symmetric, no curvature. ROM restricted. No CVA tenderness. Lungs: clear to auscultation bilaterally Heart: regular rate and rhythm, S1, S2 normal, no murmur, click, rub or gallop Abdomen: soft, non-tender; bowel sounds normal; no masses,  no organomegaly Pulses: 2+ and symmetric Skin: brawny skin changes,  New left lower extremity annular macular erythematous lesion with yellow center, asymptomatic.  Lymph nodes: Cervical, supraclavicular, and axillary nodes normal.  Lab Results  Component Value Date   HGBA1C  4.5* 04/04/2013   HGBA1C 5.8 04/18/2011    Lab Results  Component Value Date   CREATININE 1.31* 02/24/2015   CREATININE 1.31* 02/24/2015   CREATININE 1.31* 02/24/2015    Lab Results  Component Value Date   WBC 11.5* 01/04/2015   HGB 12.6 01/04/2015   HCT 37.4 01/04/2015   PLT 171.0 01/04/2015   GLUCOSE 121* 02/24/2015   GLUCOSE 121* 02/24/2015   GLUCOSE 121* 02/24/2015   CHOL 136 02/24/2015   TRIG 134.0 02/24/2015   HDL 43.60 02/24/2015   LDLDIRECT 64.7 01/01/2012   LDLCALC 66 02/24/2015   ALT 18 02/24/2015   AST 17 02/24/2015   NA 142 02/24/2015   NA 142 02/24/2015   NA 142 02/24/2015   K 4.1 02/24/2015   K 4.1 02/24/2015   K 4.1 02/24/2015   CL 107 02/24/2015   CL 107 02/24/2015   CL 107 02/24/2015   CREATININE 1.31* 02/24/2015   CREATININE 1.31* 02/24/2015   CREATININE 1.31* 02/24/2015   BUN 27* 02/24/2015   BUN 27* 02/24/2015   BUN 27* 02/24/2015   CO2 25 02/24/2015   CO2 25 02/24/2015   CO2 25 02/24/2015   TSH 0.79 08/20/2014   INR 1.03 11/28/2013   HGBA1C 4.5* 04/04/2013   MICROALBUR 1.1 04/04/2013    Ct Lumbar Spine W Contrast  05/11/2015   CLINICAL DATA:  Progressive chronic lumbago/low back pain following prior fusions.  EXAM: LUMBAR MYELOGRAM  FLUOROSCOPY TIME:  1 minutes 39 seconds  Fifteen fluoroscopic spot films were obtained.  PROCEDURE: After thorough discussion of risks and benefits of the procedure including bleeding, infection, injury to nerves, blood vessels, adjacent structures as well as headache and CSF leak, written and oral informed consent was obtained. Consent was obtained by Dr. Dereck Ligas. Time out form was completed.  Patient was positioned prone on the fluoroscopy table. Local anesthesia was provided with 1% lidocaine without epinephrine after prepped and draped in the usual sterile fashion. Puncture was performed at L3 using a  3 1/2 inch spinal via midline approach. Using a single pass through the dura, the needle was placed  within the thecal sac, with return of clear CSF. 15 mL of Omnipaque-180 was injected into the thecal sac, with normal opacification of the nerve roots and cauda equina consistent with free flow within the subarachnoid space.  I personally performed the lumbar puncture and administered the intrathecal contrast. I also personally supervised acquisition of the myelogram images.  TECHNIQUE: Contiguous axial images were obtained through the Lumbar spine after the intrathecal infusion of infusion. Coronal and sagittal reconstructions were obtained of the axial image sets.  COMPARISON:  None  FINDINGS: LUMBAR MYELOGRAM FINDINGS:  L2 through L5 posterior lumbar interbody fusion with interconnecting rod at L3. On the standing upright images, there is a mild levoconvex curve of the thoracolumbar spine. There is no recurrent spinal stenosis at the fusion levels. No pathologic motion is identified with flexion and extension maneuvers. There is little if any motion in the lumbar spine with most of the motion occurring at the hips.  Grade I retrolisthesis of L1 on L2 measures 2 mm.  CT LUMBAR MYELOGRAM FINDINGS:  Segmentation: 5 lumbar type vertebral bodies. PLIF from L2 through L5.  Alignment:  Mild levoconvex curve with the apex at L2.  Vertebrae: No aggressive osseous lesions. Vertebral body height is preserved.  Conus medullaris: Normal termination at L1.  Paraspinal tissues: Dense atherosclerosis. Superior mesenteric artery stent. Bilateral sacroiliac joint degenerative disease. Dysmorphic appearance of the medial RIGHT iliac bone, probably representing a bone graft harvesting site. This appears unchanged from 12/25/2014.  Disc levels:  T11-T12: Shallow circumferential disc bulge without stenosis. Mild RIGHT facet arthrosis.  T12-L1:  Negative.  L1-L2: Small RIGHT central disc protrusion barely indenting the thecal sac. Mild bilateral facet arthrosis. The foramina appear adequately patent. Central canal is patent.  L2-L3:  Laminectomy with decompression. Clumping of nerve roots consistent with arachnoiditis in the posterior RIGHT central canal. This continues inferiorly. No recurrent stenosis. No hardware complication from posterior rod and screw fixation. No findings of pseudoarthrosis. Good incorporation of interbody bone graft with probable tiny amount of bridging bone centrally.  L3-L4: Solid fusion. No hardware complication. No recurrent stenosis. Wide posterior decompression.  L4-L5: Solid fusion. No complicating features. No hardware complication. Wide posterior decompression. No recurrent stenosis.  L5-S1: Solid posterolateral fusion. No stenosis. LEFT laminectomy with decompression of the central canal.  IMPRESSION: 1. Technically successful lumbar puncture for lumbar myelogram. 2. L2 through L5 posterior lumbar interbody fusion. Good incorporation of bone graft at L2-L3 with a probable tiny amount of central bridging bone. No findings of pseudoarthrosis. 3. Solid fusion from L3 through S1 without recurrent stenosis. 4. Mild L1-L2 degenerative disc disease with small RIGHT paracentral protrusion but no significant stenosis. 5. Arachnoiditis.   Electronically Signed   By: Dereck Ligas M.D.   On: 05/11/2015 13:03   Dg Myelography Lumbar Inj Lumbosacral  05/11/2015   CLINICAL DATA:  Progressive chronic lumbago/low back pain following prior fusions.  EXAM: LUMBAR MYELOGRAM  FLUOROSCOPY TIME:  1 minutes 39 seconds  Fifteen fluoroscopic spot films were obtained.  PROCEDURE: After thorough discussion of risks and benefits of the procedure including bleeding, infection, injury to nerves, blood vessels, adjacent structures as well as headache and CSF leak, written and oral informed consent was obtained. Consent was obtained by Dr. Dereck Ligas. Time out form was completed.  Patient was positioned prone on the fluoroscopy table. Local anesthesia was provided with 1% lidocaine without epinephrine  after prepped and draped in the  usual sterile fashion. Puncture was performed at L3 using a 3 1/2 inch spinal via midline approach. Using a single pass through the dura, the needle was placed within the thecal sac, with return of clear CSF. 15 mL of Omnipaque-180 was injected into the thecal sac, with normal opacification of the nerve roots and cauda equina consistent with free flow within the subarachnoid space.  I personally performed the lumbar puncture and administered the intrathecal contrast. I also personally supervised acquisition of the myelogram images.  TECHNIQUE: Contiguous axial images were obtained through the Lumbar spine after the intrathecal infusion of infusion. Coronal and sagittal reconstructions were obtained of the axial image sets.  COMPARISON:  None  FINDINGS: LUMBAR MYELOGRAM FINDINGS:  L2 through L5 posterior lumbar interbody fusion with interconnecting rod at L3. On the standing upright images, there is a mild levoconvex curve of the thoracolumbar spine. There is no recurrent spinal stenosis at the fusion levels. No pathologic motion is identified with flexion and extension maneuvers. There is little if any motion in the lumbar spine with most of the motion occurring at the hips.  Grade I retrolisthesis of L1 on L2 measures 2 mm.  CT LUMBAR MYELOGRAM FINDINGS:  Segmentation: 5 lumbar type vertebral bodies. PLIF from L2 through L5.  Alignment:  Mild levoconvex curve with the apex at L2.  Vertebrae: No aggressive osseous lesions. Vertebral body height is preserved.  Conus medullaris: Normal termination at L1.  Paraspinal tissues: Dense atherosclerosis. Superior mesenteric artery stent. Bilateral sacroiliac joint degenerative disease. Dysmorphic appearance of the medial RIGHT iliac bone, probably representing a bone graft harvesting site. This appears unchanged from 12/25/2014.  Disc levels:  T11-T12: Shallow circumferential disc bulge without stenosis. Mild RIGHT facet arthrosis.  T12-L1:  Negative.  L1-L2: Small RIGHT  central disc protrusion barely indenting the thecal sac. Mild bilateral facet arthrosis. The foramina appear adequately patent. Central canal is patent.  L2-L3: Laminectomy with decompression. Clumping of nerve roots consistent with arachnoiditis in the posterior RIGHT central canal. This continues inferiorly. No recurrent stenosis. No hardware complication from posterior rod and screw fixation. No findings of pseudoarthrosis. Good incorporation of interbody bone graft with probable tiny amount of bridging bone centrally.  L3-L4: Solid fusion. No hardware complication. No recurrent stenosis. Wide posterior decompression.  L4-L5: Solid fusion. No complicating features. No hardware complication. Wide posterior decompression. No recurrent stenosis.  L5-S1: Solid posterolateral fusion. No stenosis. LEFT laminectomy with decompression of the central canal.  IMPRESSION: 1. Technically successful lumbar puncture for lumbar myelogram. 2. L2 through L5 posterior lumbar interbody fusion. Good incorporation of bone graft at L2-L3 with a probable tiny amount of central bridging bone. No findings of pseudoarthrosis. 3. Solid fusion from L3 through S1 without recurrent stenosis. 4. Mild L1-L2 degenerative disc disease with small RIGHT paracentral protrusion but no significant stenosis. 5. Arachnoiditis.   Electronically Signed   By: Dereck Ligas M.D.   On: 05/11/2015 13:03    Assessment & Plan:   Problem List Items Addressed This Visit    Chronic back pain greater than 3 months duration    Symptoms are worse and neurosurgical evaluation has bee done with myelogram.   She is now using three percocet daily. Monthly rx has been increased to  90.  Valium has been discouraged in favor of a non benzodiazepine for muscle spasm as of today's visit.  Baclofen has been prescribed due to drug coverage.  Relevant Medications   baclofen (LIORESAL) 10 MG tablet   Obesity (BMI 30-39.9) - Primary    I have addressed  BMI  and recommended wt loss of 10% of body weight over the next 6 months using a low glycemic index diet and regular exercise a minimum of 5 days per week. She has had difficulty losing weight due to increased appetite and is requesting a trial of  Phentermine.  She is aware of the possible side effects and risks and understands that    The medication will be discontinued if she has not lost 5% of her body weight over the next 3 months, which , based on today's weight is 11 lbs.       Relevant Medications   phentermine (ADIPEX-P) 37.5 MG tablet   Insomnia secondary to chronic pain    Managed with ambien and sinequan.  The risks and benefits of ambien use were reviewed were discussed with patient today including excessive sedation leading to respiratory depression,  impaired thinking/driving, and addiction.  Patient was advised to avoid concurrent use with alcohol, to use medication only as needed and not to share with others  .        Relevant Medications   baclofen (LIORESAL) 10 MG tablet   Status post lumbar spinal fusion   Major depressive disorder with single episode, in remission [facility])    She has no linger symptoms and would like to d/c lexapro.  2 week taper advised.       Petechial rash    She has one macule on left lower leg.  Asymptomatic , with no history of tick exposure.  Observation recommended and if persistnet x 2 weeks, refer to dermatology         A total of 40 minutes was spent with patient more than half of which was spent in counseling patient on the above mentioned issues , reviewing and explaining recent labs and imaging studies done, and coordination of care.  I have discontinued Ms. Hoffert's lidocaine, fluconazole, fosfomycin, and VALIUM. I am also having her start on baclofen. Additionally, I am having her maintain her sodium chloride, NEEDLE (DISP) 23 G, doxepin, vitamin C, OVER THE COUNTER MEDICATION, cyanocobalamin, benzonatate, amLODipine, Prenatal Vit-Fe Fumarate-FA  (PRENATAL 1 PLUS 1 PO), levothyroxine, montelukast, pyridOXINE, Coenzyme Q10, ergocalciferol, AMBIEN, polyethylene glycol powder, budesonide-formoterol, NORVASC, nystatin, Vitamin D (Ergocalciferol), isosorbide mononitrate, CLARINEX, SE-TAN PLUS, LIDODERM, spironolactone, COZAAR, LEXAPRO, LOVAZA, escitalopram, CRESTOR, oxyCODONE-acetaminophen, and phentermine.  Meds ordered this encounter  Medications  . baclofen (LIORESAL) 10 MG tablet    Sig: Take 1 tablet (10 mg total) by mouth 3 (three) times daily.    Dispense:  30 each    Refill:  0  . phentermine (ADIPEX-P) 37.5 MG tablet    Sig: 1/2 tablet once or twice daily for appetite suppression    Dispense:  30 tablet    Refill:  2    Medications Discontinued During This Encounter  Medication Reason  . benzonatate (TESSALON) 200 MG capsule Error  . CLARINEX 5 MG tablet Error  . CRESTOR 20 MG tablet Error  . CRESTOR 20 MG tablet Duplicate  . CRESTOR 20 MG tablet Error  . doxepin (SINEQUAN) 25 MG capsule Error  . fluconazole (DIFLUCAN) 150 MG tablet Error  . LIDODERM 5 %   . lidocaine (LIDODERM) 5 %   . LEXAPRO 10 MG tablet   . fluconazole (DIFLUCAN) 150 MG tablet Error  . fosfomycin (MONUROL) 3 G PACK   . FeFum-FePo-FA-B Cmp-C-Zn-Mn-Cu (  SE-TAN PLUS) 162-115.2-1 MG CAPS   . VALIUM 5 MG tablet     Follow-up: No Follow-up on file.   Crecencio Mc, MD

## 2015-06-01 DIAGNOSIS — Z981 Arthrodesis status: Secondary | ICD-10-CM | POA: Insufficient documentation

## 2015-06-01 DIAGNOSIS — F325 Major depressive disorder, single episode, in full remission: Secondary | ICD-10-CM | POA: Insufficient documentation

## 2015-06-01 DIAGNOSIS — R233 Spontaneous ecchymoses: Secondary | ICD-10-CM | POA: Insufficient documentation

## 2015-06-01 NOTE — Assessment & Plan Note (Signed)
She has one macule on left lower leg.  Asymptomatic , with no history of tick exposure.  Observation recommended and if persistnet x 2 weeks, refer to dermatology

## 2015-06-01 NOTE — Assessment & Plan Note (Signed)
She has no linger symptoms and would like to d/c lexapro.  2 week taper advised.

## 2015-06-01 NOTE — Assessment & Plan Note (Signed)
Symptoms are worse and neurosurgical evaluation has bee done with myelogram.   She is now using three percocet daily. Monthly rx has been increased to  90.  Valium has been discouraged in favor of a non benzodiazepine for muscle spasm as of today's visit.  Baclofen has been prescribed due to drug coverage.

## 2015-06-01 NOTE — Assessment & Plan Note (Signed)
Managed with ambien and sinequan.  The risks and benefits of ambien use were reviewed were discussed with patient today including excessive sedation leading to respiratory depression,  impaired thinking/driving, and addiction.  Patient was advised to avoid concurrent use with alcohol, to use medication only as needed and not to share with others  .

## 2015-06-01 NOTE — Assessment & Plan Note (Signed)
I have addressed  BMI and recommended wt loss of 10% of body weight over the next 6 months using a low glycemic index diet and regular exercise a minimum of 5 days per week. She has had difficulty losing weight due to increased appetite and is requesting a trial of  Phentermine.  She is aware of the possible side effects and risks and understands that    The medication will be discontinued if she has not lost 5% of her body weight over the next 3 months, which , based on today's weight is 11 lbs.

## 2015-06-21 ENCOUNTER — Other Ambulatory Visit: Payer: Self-pay | Admitting: Internal Medicine

## 2015-06-23 ENCOUNTER — Other Ambulatory Visit: Payer: Self-pay | Admitting: Internal Medicine

## 2015-06-23 NOTE — Telephone Encounter (Signed)
Last OV was 10.10.16 for a F/U, no OV scheduled. Please advise refill

## 2015-06-24 ENCOUNTER — Other Ambulatory Visit: Payer: Self-pay | Admitting: Internal Medicine

## 2015-06-24 MED ORDER — OXYCODONE-ACETAMINOPHEN 10-325 MG PO TABS: 1.0000 | ORAL_TABLET | Freq: Three times a day (TID) | ORAL | Status: DC | PRN

## 2015-06-24 NOTE — Telephone Encounter (Signed)
Nov, Dec and Jan refills for oxycodone printed. Baclofen listed as intolerance

## 2015-06-24 NOTE — Telephone Encounter (Signed)
Pt called about needing a prescription for oxyCODONE-acetaminophen (PERCOCET) 10-325 MG tablet. And the muscle relaxer that she takes makes her feel like she's in a fog. Thank You!

## 2015-06-25 ENCOUNTER — Telehealth: Payer: Self-pay | Admitting: *Deleted

## 2015-06-25 NOTE — Telephone Encounter (Signed)
Patient has called requesting a call back about  Her medication update on oxycodone Rx.

## 2015-06-25 NOTE — Telephone Encounter (Signed)
[name] called patient to pick up Rx

## 2015-06-26 NOTE — Telephone Encounter (Signed)
Ok to refill,  Refill sent  

## 2015-06-28 ENCOUNTER — Other Ambulatory Visit: Payer: Self-pay

## 2015-06-28 NOTE — Telephone Encounter (Signed)
[name] called patient on Friday regarding this, see additional note.

## 2015-06-28 NOTE — Telephone Encounter (Signed)
Pt requesting refill on

## 2015-07-07 ENCOUNTER — Other Ambulatory Visit: Payer: Self-pay | Admitting: Internal Medicine

## 2015-07-18 ENCOUNTER — Other Ambulatory Visit: Payer: Self-pay | Admitting: Internal Medicine

## 2015-08-02 ENCOUNTER — Other Ambulatory Visit: Payer: Self-pay | Admitting: Internal Medicine

## 2015-08-03 NOTE — Telephone Encounter (Signed)
Refilled  for 30 days only.  If cough persists, needs to be seen

## 2015-08-07 ENCOUNTER — Other Ambulatory Visit: Payer: Self-pay | Admitting: Internal Medicine

## 2015-08-10 ENCOUNTER — Other Ambulatory Visit: Payer: Self-pay | Admitting: Internal Medicine

## 2015-08-11 NOTE — Telephone Encounter (Signed)
Ok to refill,  Authorized in epic 

## 2015-09-06 ENCOUNTER — Encounter: Payer: Self-pay | Admitting: Internal Medicine

## 2015-09-06 ENCOUNTER — Ambulatory Visit (INDEPENDENT_AMBULATORY_CARE_PROVIDER_SITE_OTHER): Payer: Federal, State, Local not specified - PPO | Admitting: Internal Medicine

## 2015-09-06 VITALS — BP 126/62 | HR 66 | Temp 98.0°F | Resp 12 | Ht 63.0 in | Wt 227.5 lb

## 2015-09-06 DIAGNOSIS — N342 Other urethritis: Secondary | ICD-10-CM

## 2015-09-06 DIAGNOSIS — B372 Candidiasis of skin and nail: Secondary | ICD-10-CM | POA: Diagnosis not present

## 2015-09-06 DIAGNOSIS — E785 Hyperlipidemia, unspecified: Secondary | ICD-10-CM

## 2015-09-06 DIAGNOSIS — E559 Vitamin D deficiency, unspecified: Secondary | ICD-10-CM

## 2015-09-06 DIAGNOSIS — R300 Dysuria: Secondary | ICD-10-CM

## 2015-09-06 DIAGNOSIS — N390 Urinary tract infection, site not specified: Secondary | ICD-10-CM | POA: Insufficient documentation

## 2015-09-06 DIAGNOSIS — E034 Atrophy of thyroid (acquired): Secondary | ICD-10-CM

## 2015-09-06 DIAGNOSIS — Z79899 Other long term (current) drug therapy: Secondary | ICD-10-CM

## 2015-09-06 DIAGNOSIS — M25572 Pain in left ankle and joints of left foot: Secondary | ICD-10-CM | POA: Diagnosis not present

## 2015-09-06 DIAGNOSIS — M25571 Pain in right ankle and joints of right foot: Secondary | ICD-10-CM

## 2015-09-06 DIAGNOSIS — E038 Other specified hypothyroidism: Secondary | ICD-10-CM

## 2015-09-06 LAB — POCT URINALYSIS DIPSTICK
Bilirubin, UA: NEGATIVE
Glucose, UA: NEGATIVE
Ketones, UA: NEGATIVE
Nitrite, UA: POSITIVE
RBC UA: NEGATIVE
SPEC GRAV UA: 1.015
UROBILINOGEN UA: 0.2
pH, UA: 5.5

## 2015-09-06 MED ORDER — NYSTATIN 100000 UNIT/GM EX POWD: Freq: Two times a day (BID) | CUTANEOUS | Status: DC

## 2015-09-06 MED ORDER — FOSFOMYCIN TROMETHAMINE 3 G PO PACK: 3.0000 g | PACK | Freq: Once | ORAL | Status: DC

## 2015-09-06 MED ORDER — OXYCODONE-ACETAMINOPHEN 10-325 MG PO TABS: 1.0000 | ORAL_TABLET | Freq: Three times a day (TID) | ORAL | Status: DC | PRN

## 2015-09-06 NOTE — Progress Notes (Signed)
Subjective:  Patient ID: [name], female    DOB: Mar 19, 1941  Age: 75 y.o. MRN: [id number]  CC: The primary encounter diagnosis was Candidiasis of skin. Diagnoses of Dysuria, Infective urethritis, Pain in joint, ankle and foot, left, Vitamin D deficiency, Hyperlipidemia, Hypothyroidism due to acquired atrophy of thyroid, Long-term use of high-risk medication, and Pain in joint, ankle and foot, right were also pertinent to this visit.  HPI [name] presents for evaluation of multiple complaints  1) dysuria x 3 days. No recent use of antibiotics,  No recent travel or swimming pool use. Denies flank pain, fevers and nausea  ,    2) Lower abdominal rash occurring in the crease of her pannus. Has been applying medication for tinea (lamisil) with no improvement.  Rash is confined to the crease, and is pruritic  3) Foot pain.  Occurred  after fall  5 days ago, occurred at home after tripping over a spool of yarn.  She did not note any bruising, but the  foot swelled on the Lateral side near the 5th MT head and  To lateral malleolus .  The pain is made worse with weight bearing.        Outpatient Prescriptions Prior to Visit  Medication Sig Dispense Refill  . AMBIEN 5 MG tablet TAKE 1 TABLET BY MOUTH AT BEDTIME AS NEEDED FOR SLEEP 30 tablet 2  . amLODipine (NORVASC) 10 MG tablet Take 10 mg by mouth daily.    . Ascorbic Acid (VITAMIN C) 1000 MG tablet Take 500 mg by mouth daily.     . benzonatate (TESSALON) 200 MG capsule TAKE 1 CAPSULE (200 MG TOTAL) BY MOUTH 3 (THREE) TIMES DAILY AS NEEDED FOR COUGH. 60 capsule 1  . budesonide-formoterol (SYMBICORT) 80-4.5 MCG/ACT inhaler Inhale 2 puffs into the lungs 2 (two) times daily. 1 Inhaler 12  . CLARINEX 5 MG tablet TAKE 1 TABLET (5 MG TOTAL) BY MOUTH EVERY MORNING. 90 tablet 4  . Coenzyme Q10 100 MG TABS Take 1 tablet by mouth daily.    Marland Kitchen COZAAR 50 MG tablet TAKE 1 TABLET BY MOUTH TWICE A DAY 180 tablet 4  . CRESTOR 20 MG tablet TAKE 1 TABLET  (20 MG TOTAL) BY MOUTH DAILY. 30 tablet 0  . cyanocobalamin (,VITAMIN B-12,) 1000 MCG/ML injection For use with monthly B12 injections 10 mL 2  . doxepin (SINEQUAN) 25 MG capsule Take 2 capsules (50 mg total) by mouth at bedtime. 30 capsule 5  . doxepin (SINEQUAN) 25 MG capsule TAKE 2 CAPSULES (50 MG TOTAL) BY MOUTH AT BEDTIME. 180 capsule 1  . ergocalciferol (VITAMIN D2) 50000 UNITS capsule Take 50,000 Units by mouth once a week. Wednesday    . escitalopram (LEXAPRO) 10 MG tablet TAKE 1 TABLET (10 MG TOTAL) BY MOUTH DAILY. 30 tablet 10  . FeFum-FePo-FA-B Cmp-C-Zn-Mn-Cu (SE-TAN PLUS) 162-115.2-1 MG CAPS TAKE 1 CAPSULE BY MOUTH DAILY. 30 capsule 10  . isosorbide mononitrate (IMDUR) 60 MG 24 hr tablet TAKE 1 TABLET (60 MG TOTAL) BY MOUTH DAILY. 90 tablet 4  . levothyroxine (SYNTHROID, LEVOTHROID) 150 MCG tablet Take 150 mcg by mouth daily before breakfast.    . LEXAPRO 10 MG tablet TAKE 1 TABLET (10 MG TOTAL) BY MOUTH DAILY. 30 tablet 10  . LIDODERM 5 % PLACE 3 PATCHES ONTO SKIN DAILY AND DISCARD WITH 12 HOURS OR AS DIRECTED 90 patch 5  . LOVAZA 1 G capsule TAKE 2 CAPSULES (2 G TOTAL) BY MOUTH 2 (TWO) TIMES DAILY.  360 capsule 3  . montelukast (SINGULAIR) 10 MG tablet Take 10 mg by mouth at bedtime.    Marland Kitchen NEEDLE, DISP, 23 G (BD DISP NEEDLE) 23G X 1" MISC Uses to inject b12 into muscle once monthly 12 each 1  . NORVASC 10 MG tablet TAKE 1 TABLET (10 MG TOTAL) BY MOUTH DAILY. 30 tablet 5  . nystatin (MYCOSTATIN) 100000 UNIT/ML suspension TAKE 5 MLS (500,000 UNITS TOTAL) BY MOUTH 4 (FOUR) TIMES DAILY. 120 mL 0  . OVER THE COUNTER MEDICATION Take 1 capsule by mouth daily. Florify    . polyethylene glycol powder (GLYCOLAX/MIRALAX) powder 255 grams one bottle for colonoscopy prep 255 g 0  . Prenatal Vit-Fe Fumarate-FA (PRENATAL 1 PLUS 1 PO) Take 1 tablet by mouth daily.    Marland Kitchen pyridOXINE (VITAMIN B-6) 100 MG tablet Take 100 mg by mouth daily.    Marland Kitchen SINGULAIR 10 MG tablet TAKE 1 TABLET BY MOUTH AT BEDTIME 30  tablet 6  . sodium chloride (OCEAN) 0.65 % nasal spray Place 2 sprays into the nose as needed. Allergies      . SYNTHROID 150 MCG tablet TAKE 1 TABLET (150 MCG TOTAL) BY MOUTH DAILY. 30 tablet 7  . Vitamin D, Ergocalciferol, (DRISDOL) 50000 UNITS CAPS capsule TAKE 1 CAPSULE (50,000 UNITS TOTAL) BY MOUTH EVERY 7 (SEVEN) DAYS. 12 capsule 0  . oxyCODONE-acetaminophen (PERCOCET) 10-325 MG tablet Take 1 tablet by mouth every 8 (eight) hours as needed for pain. May refill on or after June 26 2015 90 tablet 0  . oxyCODONE-acetaminophen (PERCOCET) 10-325 MG tablet Take 1 tablet by mouth every 8 (eight) hours as needed for pain. May refill on or after August 26 2015 90 tablet 0  . phentermine (ADIPEX-P) 37.5 MG tablet 1/2 tablet once or twice daily for appetite suppression (Patient not taking: Reported on 09/06/2015) 30 tablet 2  . spironolactone (ALDACTONE) 25 MG tablet TAKE 1 TABLET EVERY DAY (Patient not taking: Reported on 09/06/2015) 30 tablet 10  . benzonatate (TESSALON) 100 MG capsule TAKE 1 CAPSULE (100 MG TOTAL) BY MOUTH 2 (TWO) TIMES DAILY. 60 capsule 1   Facility-Administered Medications Prior to Visit  Medication Dose Route Frequency Provider Last Rate Last Dose  . chlorhexidine (HIBICLENS) 4 % liquid 4 application  60 mL Topical Once Gaynelle Arabian, MD        Review of Systems;  Patient denies headache, fevers, malaise, unintentional weight loss, skin rash, eye pain, sinus congestion and sinus pain, sore throat, dysphagia,  hemoptysis , cough, dyspnea, wheezing, chest pain, palpitations, orthopnea, edema, abdominal pain, nausea, melena, diarrhea, constipation, flank pain, dysuria, hematuria, urinary  Frequency, nocturia, numbness, tingling, seizures,  Focal weakness, Loss of consciousness,  Tremor, insomnia, depression, anxiety, and suicidal ideation.      Objective:  BP 126/62 mmHg  Pulse 66  Temp(Src) 98 F (36.7 C) (Oral)  Resp 12  Ht 5\' 3"  (1.6 m)  Wt 227 lb 8 oz (103.193 kg)   BMI 40.31 kg/m2  SpO2 94%  BP Readings from Last 3 Encounters:  09/06/15 126/62  05/31/15 120/70  05/11/15 154/62    Wt Readings from Last 3 Encounters:  09/06/15 227 lb 8 oz (103.193 kg)  05/31/15 221 lb 2 oz (100.302 kg)  03/16/15 223 lb 4 oz (101.266 kg)    General appearance: alert, cooperative and appears stated age Neck: no adenopathy, no carotid bruit, supple, symmetrical, trachea midline and thyroid not enlarged, symmetric, no tenderness/mass/nodules Back: symmetric, no curvature. ROM normal. No CVA tenderness. Lungs:  clear to auscultation bilaterally Heart: regular rate and rhythm, S1, S2 normal, no murmur, click, rub or gallop Abdomen: soft, non-tender; bowel sounds normal; no masses,  no organomegaly Pulses: 2+ and symmetric Skin: erythematous macular rash in the abdominal crease.Skin color, texture, turgor elsewhere is  normal.  Lymph nodes: Cervical, supraclavicular, and axillary nodes normal. MSK:  Right foot tender to palpation over palmar surface of 5th MT head, and tender over the lateral malleolus   Lab Results  Component Value Date   HGBA1C 4.5* 04/04/2013   HGBA1C 5.8 04/18/2011    Lab Results  Component Value Date   CREATININE 1.31* 02/24/2015   CREATININE 1.31* 02/24/2015   CREATININE 1.31* 02/24/2015    Lab Results  Component Value Date   WBC 11.5* 01/04/2015   HGB 12.6 01/04/2015   HCT 37.4 01/04/2015   PLT 171.0 01/04/2015   GLUCOSE 121* 02/24/2015   GLUCOSE 121* 02/24/2015   GLUCOSE 121* 02/24/2015   CHOL 136 02/24/2015   TRIG 134.0 02/24/2015   HDL 43.60 02/24/2015   LDLDIRECT 64.7 01/01/2012   LDLCALC 66 02/24/2015   ALT 18 02/24/2015   AST 17 02/24/2015   NA 142 02/24/2015   NA 142 02/24/2015   NA 142 02/24/2015   K 4.1 02/24/2015   K 4.1 02/24/2015   K 4.1 02/24/2015   CL 107 02/24/2015   CL 107 02/24/2015   CL 107 02/24/2015   CREATININE 1.31* 02/24/2015   CREATININE 1.31* 02/24/2015   CREATININE 1.31* 02/24/2015   BUN  27* 02/24/2015   BUN 27* 02/24/2015   BUN 27* 02/24/2015   CO2 25 02/24/2015   CO2 25 02/24/2015   CO2 25 02/24/2015   TSH 0.79 08/20/2014   INR 1.03 11/28/2013   HGBA1C 4.5* 04/04/2013   MICROALBUR 1.1 04/04/2013    Ct Lumbar Spine W Contrast  05/11/2015  CLINICAL DATA:  Progressive chronic lumbago/low back pain following prior fusions. EXAM: LUMBAR MYELOGRAM FLUOROSCOPY TIME:  1 minutes 39 seconds Fifteen fluoroscopic spot films were obtained. PROCEDURE: After thorough discussion of risks and benefits of the procedure including bleeding, infection, injury to nerves, blood vessels, adjacent structures as well as headache and CSF leak, written and oral informed consent was obtained. Consent was obtained by Dr. Dereck Ligas. Time out form was completed. Patient was positioned prone on the fluoroscopy table. Local anesthesia was provided with 1% lidocaine without epinephrine after prepped and draped in the usual sterile fashion. Puncture was performed at L3 using a 3 1/2 inch spinal via midline approach. Using a single pass through the dura, the needle was placed within the thecal sac, with return of clear CSF. 15 mL of Omnipaque-180 was injected into the thecal sac, with normal opacification of the nerve roots and cauda equina consistent with free flow within the subarachnoid space. I personally performed the lumbar puncture and administered the intrathecal contrast. I also personally supervised acquisition of the myelogram images. TECHNIQUE: Contiguous axial images were obtained through the Lumbar spine after the intrathecal infusion of infusion. Coronal and sagittal reconstructions were obtained of the axial image sets. COMPARISON:  None FINDINGS: LUMBAR MYELOGRAM FINDINGS: L2 through L5 posterior lumbar interbody fusion with interconnecting rod at L3. On the standing upright images, there is a mild levoconvex curve of the thoracolumbar spine. There is no recurrent spinal stenosis at the fusion  levels. No pathologic motion is identified with flexion and extension maneuvers. There is little if any motion in the lumbar spine with most of the motion occurring  at the hips. Grade I retrolisthesis of L1 on L2 measures 2 mm. CT LUMBAR MYELOGRAM FINDINGS: Segmentation: 5 lumbar type vertebral bodies. PLIF from L2 through L5. Alignment:  Mild levoconvex curve with the apex at L2. Vertebrae: No aggressive osseous lesions. Vertebral body height is preserved. Conus medullaris: Normal termination at L1. Paraspinal tissues: Dense atherosclerosis. Superior mesenteric artery stent. Bilateral sacroiliac joint degenerative disease. Dysmorphic appearance of the medial RIGHT iliac bone, probably representing a bone graft harvesting site. This appears unchanged from 12/25/2014. Disc levels: T11-T12: Shallow circumferential disc bulge without stenosis. Mild RIGHT facet arthrosis. T12-L1:  Negative. L1-L2: Small RIGHT central disc protrusion barely indenting the thecal sac. Mild bilateral facet arthrosis. The foramina appear adequately patent. Central canal is patent. L2-L3: Laminectomy with decompression. Clumping of nerve roots consistent with arachnoiditis in the posterior RIGHT central canal. This continues inferiorly. No recurrent stenosis. No hardware complication from posterior rod and screw fixation. No findings of pseudoarthrosis. Good incorporation of interbody bone graft with probable tiny amount of bridging bone centrally. L3-L4: Solid fusion. No hardware complication. No recurrent stenosis. Wide posterior decompression. L4-L5: Solid fusion. No complicating features. No hardware complication. Wide posterior decompression. No recurrent stenosis. L5-S1: Solid posterolateral fusion. No stenosis. LEFT laminectomy with decompression of the central canal. IMPRESSION: 1. Technically successful lumbar puncture for lumbar myelogram. 2. L2 through L5 posterior lumbar interbody fusion. Good incorporation of bone graft at L2-L3  with a probable tiny amount of central bridging bone. No findings of pseudoarthrosis. 3. Solid fusion from L3 through S1 without recurrent stenosis. 4. Mild L1-L2 degenerative disc disease with small RIGHT paracentral protrusion but no significant stenosis. 5. Arachnoiditis. Electronically Signed   By: Dereck Ligas M.D.   On: 05/11/2015 13:03   Dg Myelography Lumbar Inj Lumbosacral  05/11/2015  CLINICAL DATA:  Progressive chronic lumbago/low back pain following prior fusions. EXAM: LUMBAR MYELOGRAM FLUOROSCOPY TIME:  1 minutes 39 seconds Fifteen fluoroscopic spot films were obtained. PROCEDURE: After thorough discussion of risks and benefits of the procedure including bleeding, infection, injury to nerves, blood vessels, adjacent structures as well as headache and CSF leak, written and oral informed consent was obtained. Consent was obtained by Dr. Dereck Ligas. Time out form was completed. Patient was positioned prone on the fluoroscopy table. Local anesthesia was provided with 1% lidocaine without epinephrine after prepped and draped in the usual sterile fashion. Puncture was performed at L3 using a 3 1/2 inch spinal via midline approach. Using a single pass through the dura, the needle was placed within the thecal sac, with return of clear CSF. 15 mL of Omnipaque-180 was injected into the thecal sac, with normal opacification of the nerve roots and cauda equina consistent with free flow within the subarachnoid space. I personally performed the lumbar puncture and administered the intrathecal contrast. I also personally supervised acquisition of the myelogram images. TECHNIQUE: Contiguous axial images were obtained through the Lumbar spine after the intrathecal infusion of infusion. Coronal and sagittal reconstructions were obtained of the axial image sets. COMPARISON:  None FINDINGS: LUMBAR MYELOGRAM FINDINGS: L2 through L5 posterior lumbar interbody fusion with interconnecting rod at L3. On the standing  upright images, there is a mild levoconvex curve of the thoracolumbar spine. There is no recurrent spinal stenosis at the fusion levels. No pathologic motion is identified with flexion and extension maneuvers. There is little if any motion in the lumbar spine with most of the motion occurring at the hips. Grade I retrolisthesis of L1 on L2 measures 2 mm.  CT LUMBAR MYELOGRAM FINDINGS: Segmentation: 5 lumbar type vertebral bodies. PLIF from L2 through L5. Alignment:  Mild levoconvex curve with the apex at L2. Vertebrae: No aggressive osseous lesions. Vertebral body height is preserved. Conus medullaris: Normal termination at L1. Paraspinal tissues: Dense atherosclerosis. Superior mesenteric artery stent. Bilateral sacroiliac joint degenerative disease. Dysmorphic appearance of the medial RIGHT iliac bone, probably representing a bone graft harvesting site. This appears unchanged from 12/25/2014. Disc levels: T11-T12: Shallow circumferential disc bulge without stenosis. Mild RIGHT facet arthrosis. T12-L1:  Negative. L1-L2: Small RIGHT central disc protrusion barely indenting the thecal sac. Mild bilateral facet arthrosis. The foramina appear adequately patent. Central canal is patent. L2-L3: Laminectomy with decompression. Clumping of nerve roots consistent with arachnoiditis in the posterior RIGHT central canal. This continues inferiorly. No recurrent stenosis. No hardware complication from posterior rod and screw fixation. No findings of pseudoarthrosis. Good incorporation of interbody bone graft with probable tiny amount of bridging bone centrally. L3-L4: Solid fusion. No hardware complication. No recurrent stenosis. Wide posterior decompression. L4-L5: Solid fusion. No complicating features. No hardware complication. Wide posterior decompression. No recurrent stenosis. L5-S1: Solid posterolateral fusion. No stenosis. LEFT laminectomy with decompression of the central canal. IMPRESSION: 1. Technically successful  lumbar puncture for lumbar myelogram. 2. L2 through L5 posterior lumbar interbody fusion. Good incorporation of bone graft at L2-L3 with a probable tiny amount of central bridging bone. No findings of pseudoarthrosis. 3. Solid fusion from L3 through S1 without recurrent stenosis. 4. Mild L1-L2 degenerative disc disease with small RIGHT paracentral protrusion but no significant stenosis. 5. Arachnoiditis. Electronically Signed   By: Dereck Ligas M.D.   On: 05/11/2015 13:03    Assessment & Plan:   Problem List Items Addressed This Visit    Candidiasis of skin - Primary    Occuring in the crease of her abdominal pannus  Nystatin powder bid until resolved ; the advised to use Gold Bond Medicated powder.       Relevant Medications   nystatin (MYCOSTATIN) powder   UTI (urinary tract infection)    Empiric treatment with fosfomycin. Culture is pending       Relevant Medications   nystatin (MYCOSTATIN) powder   fosfomycin (MONUROL) 3 g PACK   Other Relevant Orders   CBC with Differential/Platelet   Pain in joint, ankle and foot    Plain films ordered to rule out MT or navicular bone fracture.       Relevant Orders   DG Foot Complete Left   Hypothyroidism   Relevant Orders   TSH    Other Visit Diagnoses    Dysuria        Relevant Orders    POCT Urinalysis Dipstick (Completed)    Urine Culture    Urinalysis, Routine w reflex microscopic (Completed)    Vitamin D deficiency        Relevant Orders    VITAMIN D 25 Hydroxy (Vit-D Deficiency, Fractures)    Hyperlipidemia        Relevant Orders    Lipid panel    Long-term use of high-risk medication        Relevant Orders    Comprehensive metabolic panel       I have changed Ms. Benison's oxyCODONE-acetaminophen and oxyCODONE-acetaminophen. I am also having her start on nystatin and fosfomycin. Additionally, I am having her maintain her sodium chloride, NEEDLE (DISP) 23 G, doxepin, vitamin C, OVER THE COUNTER MEDICATION,  cyanocobalamin, amLODipine, Prenatal Vit-Fe Fumarate-FA (PRENATAL 1 PLUS 1 PO), levothyroxine, montelukast,  pyridOXINE, Coenzyme Q10, ergocalciferol, polyethylene glycol powder, budesonide-formoterol, nystatin, isosorbide mononitrate, CLARINEX, SE-TAN PLUS, LIDODERM, spironolactone, COZAAR, LEXAPRO, LOVAZA, escitalopram, CRESTOR, phentermine, doxepin, Vitamin D (Ergocalciferol), SINGULAIR, NORVASC, benzonatate, SYNTHROID, and AMBIEN.  Meds ordered this encounter  Medications  . nystatin (MYCOSTATIN) powder    Sig: Apply topically 2 (two) times daily. Until rash has resolved    Dispense:  15 g    Refill:  0  . fosfomycin (MONUROL) 3 g PACK    Sig: Take 3 g by mouth once.    Dispense:  1 g    Refill:  0  . oxyCODONE-acetaminophen (PERCOCET) 10-325 MG tablet    Sig: Take 1 tablet by mouth every 8 (eight) hours as needed for pain. May refill on or after October 24 2015    Dispense:  90 tablet    Refill:  0  . oxyCODONE-acetaminophen (PERCOCET) 10-325 MG tablet    Sig: Take 1 tablet by mouth every 8 (eight) hours as needed for pain. May refill on or after Sep 26 2015    Dispense:  90 tablet    Refill:  0   A total of 25 minutes of face to face time was spent with patient more than half of which was spent in counselling about the above mentioned conditions  and coordination of care   Medications Discontinued During This Encounter  Medication Reason  . benzonatate (TESSALON) 100 MG capsule Change in therapy  . oxyCODONE-acetaminophen (PERCOCET) 10-325 MG tablet Reorder  . oxyCODONE-acetaminophen (PERCOCET) 10-325 MG tablet Reorder    Follow-up: No Follow-up on file.   Crecencio Mc, MD

## 2015-09-06 NOTE — Progress Notes (Signed)
Pre-visit discussion using our clinic review tool. No additional management support is needed unless otherwise documented below in the visit note.  

## 2015-09-06 NOTE — Patient Instructions (Addendum)
Fosfomycin for the UTI   Nystatin twice daily for the rash,  Once resolved,  swtch to daily use of Gold Bond powder medicated   [place] films of left foot tomorrow   Return for fasting labs and thyroid check this month

## 2015-09-07 ENCOUNTER — Ambulatory Visit: Payer: Federal, State, Local not specified - PPO | Admitting: Family Medicine

## 2015-09-07 ENCOUNTER — Encounter: Payer: Self-pay | Admitting: Internal Medicine

## 2015-09-07 DIAGNOSIS — M25579 Pain in unspecified ankle and joints of unspecified foot: Secondary | ICD-10-CM | POA: Insufficient documentation

## 2015-09-07 LAB — URINALYSIS, ROUTINE W REFLEX MICROSCOPIC
BILIRUBIN URINE: NEGATIVE
Hgb urine dipstick: NEGATIVE
Ketones, ur: NEGATIVE
NITRITE: POSITIVE — AB
PH: 6 (ref 5.0–8.0)
Specific Gravity, Urine: 1.01 (ref 1.000–1.030)
TOTAL PROTEIN, URINE-UPE24: 30 — AB
Urine Glucose: NEGATIVE
Urobilinogen, UA: 0.2 (ref 0.0–1.0)

## 2015-09-07 NOTE — Assessment & Plan Note (Signed)
Plain films ordered to rule out MT or navicular bone fracture.

## 2015-09-07 NOTE — Assessment & Plan Note (Signed)
Empiric treatment with fosfomycin. Culture is pending

## 2015-09-07 NOTE — Assessment & Plan Note (Signed)
Occuring in the crease of her abdominal pannus  Nystatin powder bid until resolved ; the advised to use Gold Bond Medicated powder.

## 2015-09-10 LAB — URINE CULTURE: Colony Count: >=100000

## 2015-09-28 ENCOUNTER — Other Ambulatory Visit: Payer: Self-pay | Admitting: Internal Medicine

## 2015-09-29 NOTE — Telephone Encounter (Signed)
Tessalon was last refilled on 08/03/15, please advise if you want it refilled. Thanks

## 2015-09-29 NOTE — Telephone Encounter (Signed)
Ok to refill,  Refill sent  

## 2015-10-05 ENCOUNTER — Other Ambulatory Visit: Payer: Self-pay | Admitting: Internal Medicine

## 2015-10-05 ENCOUNTER — Other Ambulatory Visit (INDEPENDENT_AMBULATORY_CARE_PROVIDER_SITE_OTHER): Payer: Federal, State, Local not specified - PPO

## 2015-10-05 DIAGNOSIS — E038 Other specified hypothyroidism: Secondary | ICD-10-CM

## 2015-10-05 DIAGNOSIS — E785 Hyperlipidemia, unspecified: Secondary | ICD-10-CM | POA: Diagnosis not present

## 2015-10-05 DIAGNOSIS — E034 Atrophy of thyroid (acquired): Secondary | ICD-10-CM | POA: Diagnosis not present

## 2015-10-05 DIAGNOSIS — Z79899 Other long term (current) drug therapy: Secondary | ICD-10-CM | POA: Diagnosis not present

## 2015-10-05 DIAGNOSIS — E559 Vitamin D deficiency, unspecified: Secondary | ICD-10-CM | POA: Diagnosis not present

## 2015-10-05 DIAGNOSIS — N342 Other urethritis: Secondary | ICD-10-CM | POA: Diagnosis not present

## 2015-10-05 LAB — COMPREHENSIVE METABOLIC PANEL
ALBUMIN: 4.1 g/dL (ref 3.5–5.2)
ALT: 22 U/L (ref 0–35)
AST: 18 U/L (ref 0–37)
Alkaline Phosphatase: 76 U/L (ref 39–117)
BUN: 22 mg/dL (ref 6–23)
CHLORIDE: 109 meq/L (ref 96–112)
CO2: 26 meq/L (ref 19–32)
Calcium: 10 mg/dL (ref 8.4–10.5)
Creatinine, Ser: 1.13 mg/dL (ref 0.40–1.20)
GFR: 50.21 mL/min — ABNORMAL LOW (ref >60.00)
Glucose, Bld: 109 mg/dL — ABNORMAL HIGH (ref 70–99)
POTASSIUM: 3.8 meq/L (ref 3.5–5.1)
SODIUM: 144 meq/L (ref 135–145)
Total Bilirubin: 0.8 mg/dL (ref 0.2–1.2)
Total Protein: 6.3 g/dL (ref 6.0–8.3)

## 2015-10-05 LAB — LIPID PANEL
CHOL/HDL RATIO: 3
Cholesterol: 151 mg/dL (ref 0–200)
HDL: 52.8 mg/dL (ref >39.00)
LDL CALC: 71 mg/dL (ref 0–99)
NONHDL: 98.41
Triglycerides: 137 mg/dL (ref 0.0–149.0)
VLDL: 27.4 mg/dL (ref 0.0–40.0)

## 2015-10-05 LAB — VITAMIN D 25 HYDROXY (VIT D DEFICIENCY, FRACTURES): VITD: 41.5 ng/mL (ref 30.00–100.00)

## 2015-10-05 LAB — CBC WITH DIFFERENTIAL/PLATELET
BASOS PCT: 0.5 % (ref 0.0–3.0)
Basophils Absolute: 0 10*3/uL (ref 0.0–0.1)
EOS PCT: 2.3 % (ref 0.0–5.0)
Eosinophils Absolute: 0.2 10*3/uL (ref 0.0–0.7)
HEMATOCRIT: 39.9 % (ref 36.0–46.0)
HEMOGLOBIN: 13.6 g/dL (ref 12.0–15.0)
LYMPHS PCT: 37 % (ref 12.0–46.0)
Lymphs Abs: 2.4 10*3/uL (ref 0.7–4.0)
MCHC: 34.2 g/dL (ref 30.0–36.0)
MCV: 92.9 fl (ref 78.0–100.0)
MONO ABS: 0.5 10*3/uL (ref 0.1–1.0)
Monocytes Relative: 8.4 % (ref 3.0–12.0)
Neutro Abs: 3.3 10*3/uL (ref 1.4–7.7)
Neutrophils Relative %: 51.8 % (ref 43.0–77.0)
Platelets: 146 10*3/uL — ABNORMAL LOW (ref 150.0–400.0)
RBC: 4.29 Mil/uL (ref 3.87–5.11)
RDW: 12.7 % (ref 11.5–15.5)
WBC: 6.5 10*3/uL (ref 4.0–10.5)

## 2015-10-05 LAB — TSH: TSH: 0.11 u[IU]/mL — AB (ref 0.35–4.50)

## 2015-10-06 NOTE — Telephone Encounter (Signed)
This medication was discontinued. I tried calling patient but phone as been busy every time. Please advise refill?

## 2015-10-06 NOTE — Telephone Encounter (Signed)
No refills.  Patient taking oxycodone

## 2015-10-07 ENCOUNTER — Other Ambulatory Visit: Payer: Self-pay | Admitting: Internal Medicine

## 2015-10-07 ENCOUNTER — Encounter: Payer: Self-pay | Admitting: Internal Medicine

## 2015-10-07 ENCOUNTER — Telehealth: Payer: Self-pay | Admitting: *Deleted

## 2015-10-07 DIAGNOSIS — E034 Atrophy of thyroid (acquired): Secondary | ICD-10-CM

## 2015-10-07 MED ORDER — SYNTHROID 137 MCG PO TABS: 137.0000 ug | ORAL_TABLET | Freq: Every day | ORAL | Status: DC

## 2015-10-07 NOTE — Telephone Encounter (Signed)
Patient stated that she received a call,from the office on 10/06/2015 She did not have a message  Pt. Contact [phone]

## 2015-10-07 NOTE — Assessment & Plan Note (Signed)
Dose reduced to 137 mcg for  Lab Results  Component Value Date   TSH 0.11* 10/05/2015

## 2015-10-08 NOTE — Telephone Encounter (Signed)
Pt called returned your call. Call pt @ [phone]. Thank you!

## 2015-10-08 NOTE — Telephone Encounter (Signed)
Pt notified of test results and sent copy in the mail, requested by the pt

## 2015-10-08 NOTE — Telephone Encounter (Signed)
LMOMTCB

## 2015-10-22 ENCOUNTER — Other Ambulatory Visit: Payer: Self-pay | Admitting: Internal Medicine

## 2015-10-22 NOTE — Telephone Encounter (Signed)
Mailed unread message to patient.  

## 2015-10-28 ENCOUNTER — Telehealth: Payer: Self-pay | Admitting: Internal Medicine

## 2015-10-28 NOTE — Telephone Encounter (Signed)
Pt is having pain on the right side where her stent is located. Pt would like to be seen as soon as possible. Could we get her in to see Dr. Derrel Nip?   Please call pt at home number.

## 2015-10-29 NOTE — Telephone Encounter (Signed)
I called and spoke with the pt and she stated that she was going to call where she got the stent placed to see if she needed to be seen. I Stated that if she needed anything to call us back. Pt expressed that she is not in any pain and that the pain resolved on its own.

## 2015-11-03 ENCOUNTER — Other Ambulatory Visit: Payer: Self-pay | Admitting: Internal Medicine

## 2015-11-03 NOTE — Telephone Encounter (Signed)
REFILLED

## 2015-11-03 NOTE — Telephone Encounter (Signed)
Please advise refill.  Thanks 

## 2015-11-05 IMAGING — CR DG CHEST 2V
1 series · 2 of 2 positions shown · non-contrast
Comparison: None.

CLINICAL DATA: Cough.

EXAM:
CHEST  2 VIEW

[Series 1: dg chest 2 view · 0.14mm/px · 2 of 2 slices shown]
[im 1/2]
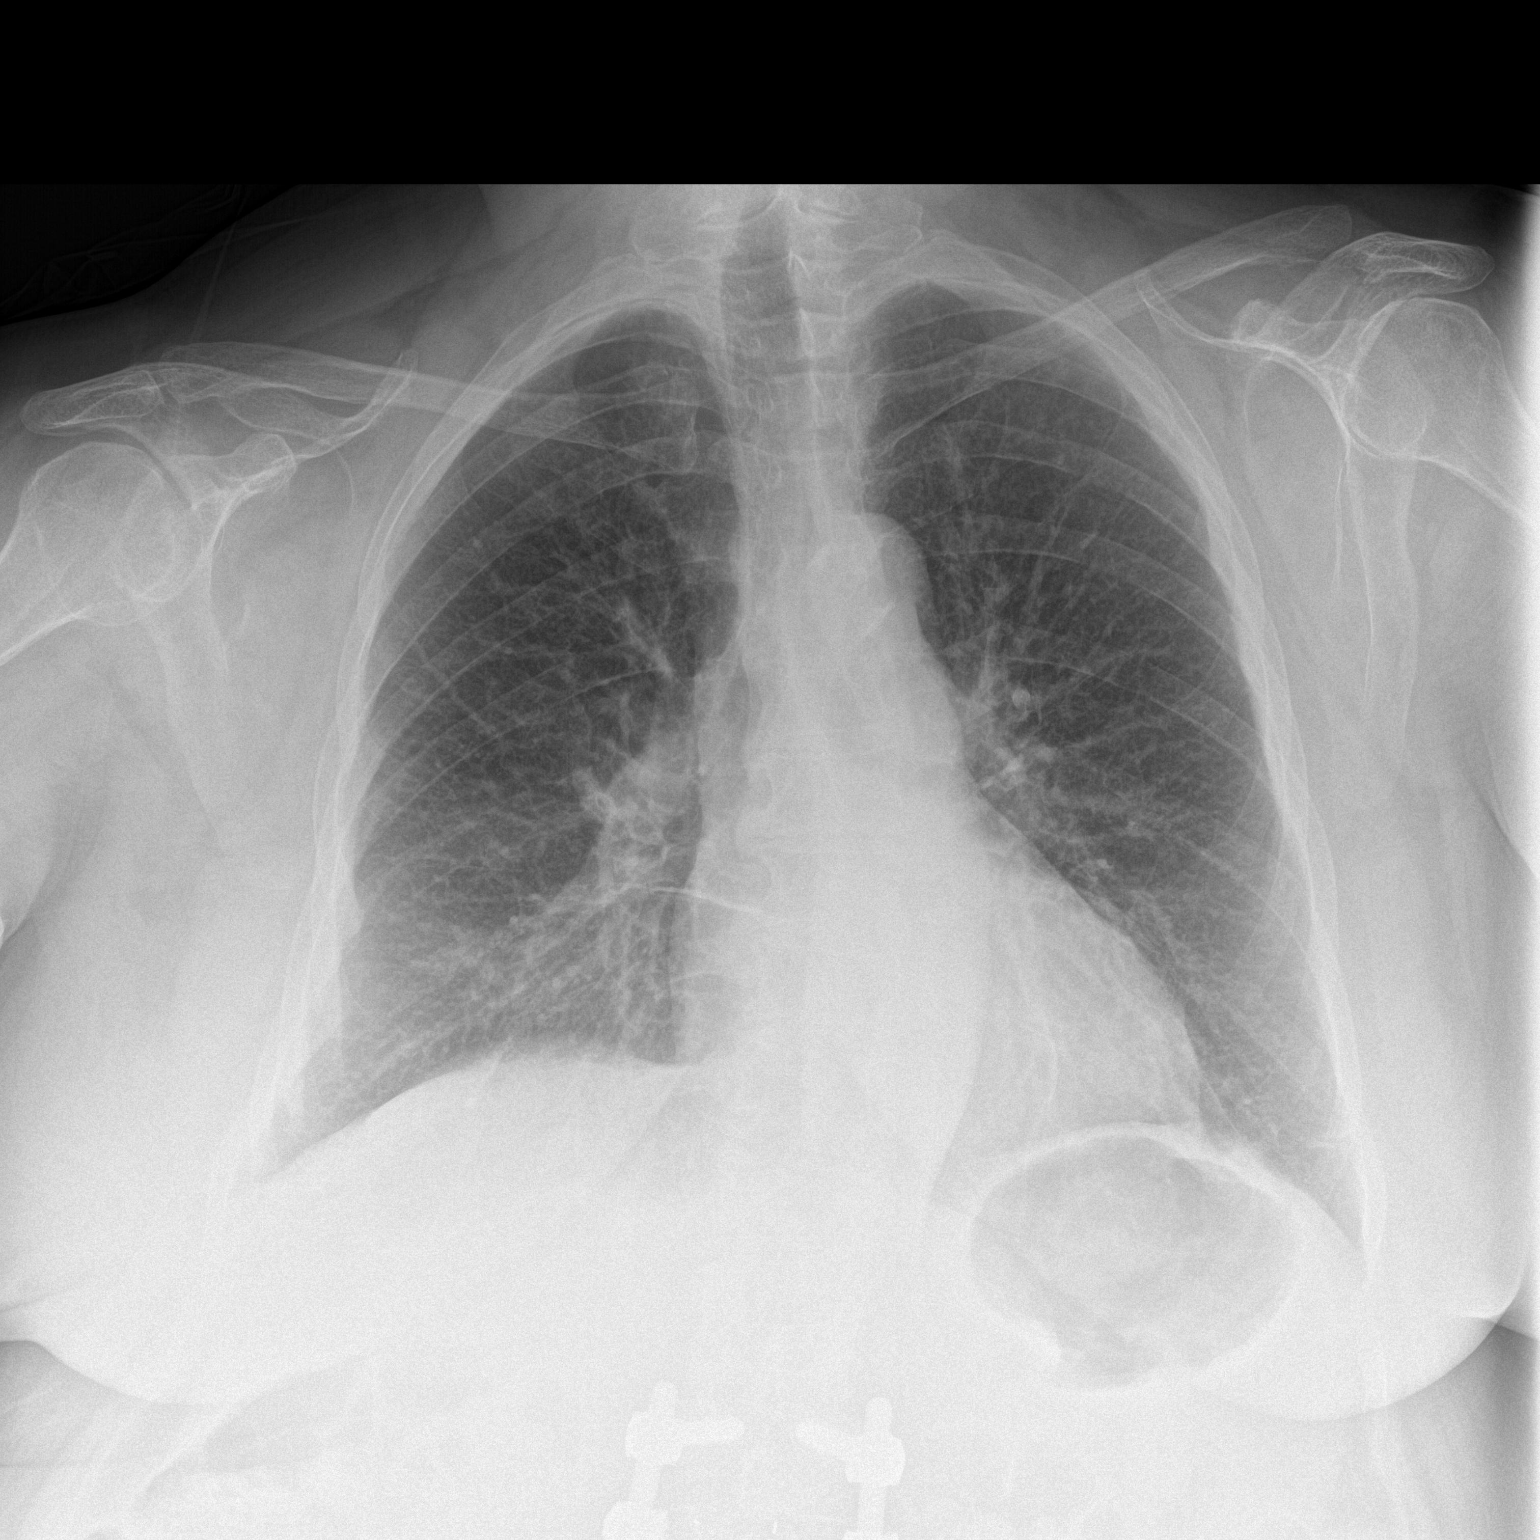
[im 2/2]
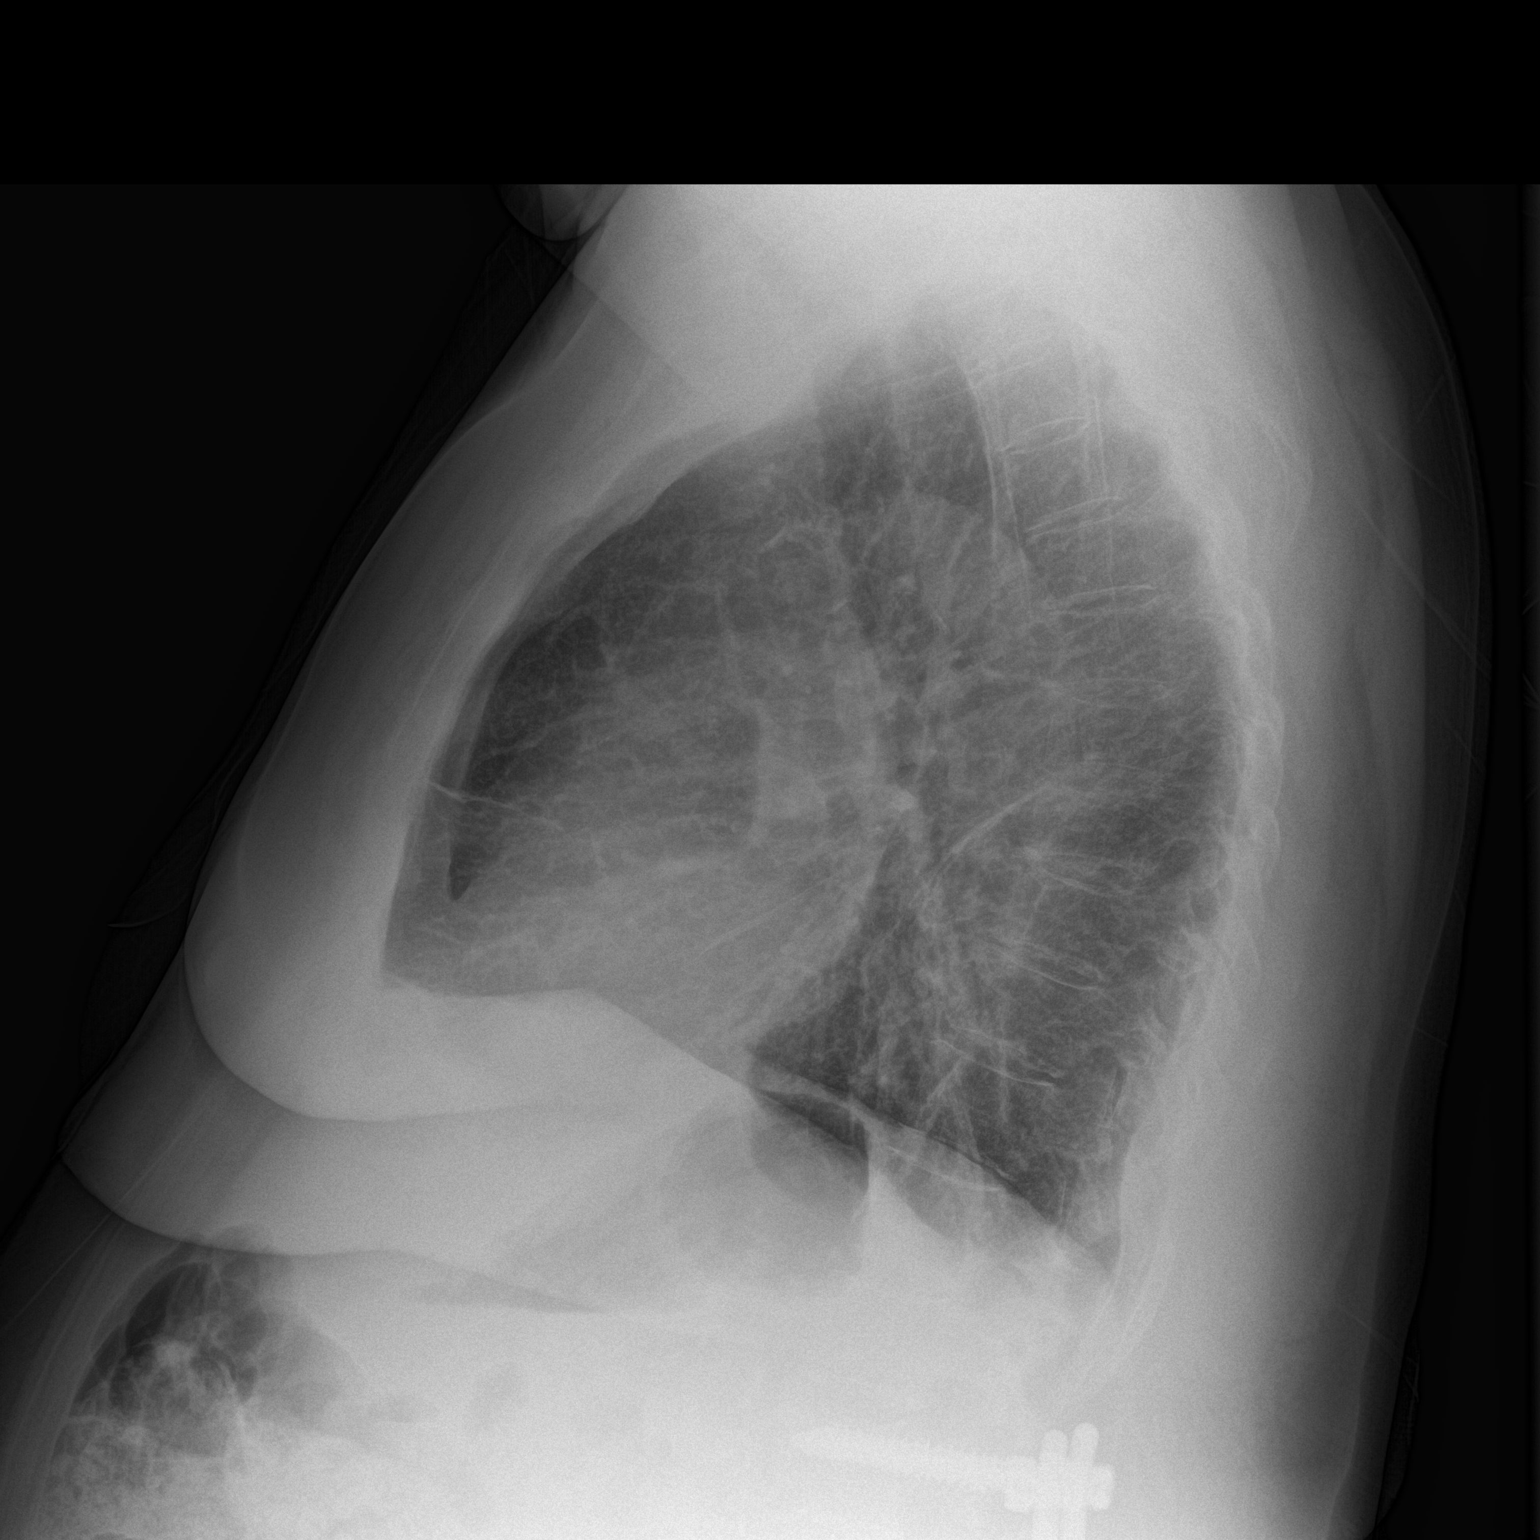

[2 of 2 positions shown; findings below may reference images not displayed]

FINDINGS: Mediastinum and hilar structures are normal. Right lower lobe
infiltrate consistent pneumonia. Left base subsegmental atelectasis.
Heart size is normal. Degenerative changes thoracolumbar spine with
scoliosis. Prior lumbar spine fusion.
IMPRESSION: 1.  Right base mild infiltrate consistent with pneumonia.

2. Mild bibasilar subsegmental atelectasis.

## 2015-11-09 ENCOUNTER — Other Ambulatory Visit: Payer: Self-pay | Admitting: Internal Medicine

## 2015-11-09 NOTE — Telephone Encounter (Signed)
Refilled 07/2015 with three refills. Please advise?

## 2015-11-09 NOTE — Telephone Encounter (Signed)
Ok to refill,  printed 

## 2015-11-10 NOTE — Telephone Encounter (Signed)
Refill faxed

## 2015-11-15 ENCOUNTER — Ambulatory Visit (INDEPENDENT_AMBULATORY_CARE_PROVIDER_SITE_OTHER): Payer: Federal, State, Local not specified - PPO | Admitting: Podiatry

## 2015-11-15 ENCOUNTER — Encounter: Payer: Self-pay | Admitting: Podiatry

## 2015-11-15 ENCOUNTER — Ambulatory Visit (INDEPENDENT_AMBULATORY_CARE_PROVIDER_SITE_OTHER): Payer: Federal, State, Local not specified - PPO

## 2015-11-15 VITALS — BP 113/56 | HR 62 | Resp 12

## 2015-11-15 DIAGNOSIS — M7751 Other enthesopathy of right foot: Secondary | ICD-10-CM

## 2015-11-15 DIAGNOSIS — M778 Other enthesopathies, not elsewhere classified: Secondary | ICD-10-CM

## 2015-11-15 DIAGNOSIS — G5782 Other specified mononeuropathies of left lower limb: Secondary | ICD-10-CM

## 2015-11-15 DIAGNOSIS — G5762 Lesion of plantar nerve, left lower limb: Secondary | ICD-10-CM | POA: Diagnosis not present

## 2015-11-15 DIAGNOSIS — M779 Enthesopathy, unspecified: Secondary | ICD-10-CM

## 2015-11-15 DIAGNOSIS — M79673 Pain in unspecified foot: Secondary | ICD-10-CM | POA: Diagnosis not present

## 2015-11-15 NOTE — Progress Notes (Signed)
   Subjective:    Patient ID: [name], female    DOB: 23-May-1941, 75 y.o.   MRN: [id number]  HPI: She presents today with a several week duration of pain to the forefoot left. States that several years ago she broke her fourth metatarsal and just never healed correctly. She states that she has pain in that forefoot on a regular basis. She is also concerned about swelling and pain to the anterolateral aspect of the right ankle and foot. She states this too has been present for quite some time. She had her first metatarsophalangeal joint fused by Dr. Doran Durand.    Review of Systems  Constitutional: Positive for fatigue and unexpected weight change.  HENT: Positive for hearing loss and sinus pressure.   Cardiovascular: Positive for leg swelling.  Gastrointestinal: Positive for abdominal pain.  Musculoskeletal: Positive for myalgias, back pain, joint swelling and gait problem.       Objective:   Physical Exam: Vital signs are stable alert and oriented 3. No apparent distress. Pulses are strongly palpable bilateral. Neurologic sensorium is intact per Semmes-Weinstein monofilament. Deep tendon reflexes are intact bilateral and muscle strength +5 over 5 dorsiflexes plantar flexors and inverters everters all into the musculature is intact. Orthopedic evaluation demonstrates all joints distal to the ankle for range of motion without crepitation. This is with the exception of the first metatarsophalangeal joint. She has pain on end range of motion of the subtalar joint right foot. And a palpable Mulder's click to the third interdigital space of the left foot. No open lesions or wounds are noted.      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  Plan: Discussed etiology pathology conservative versus surgical therapies. At this point injected both sites with Kenalog and local anesthetic after sterile Betadine skin prep. She tolerated  this procedure well and I will follow-up with her in 1 month. We discussed appropriate shoe gear stretching exercises ice therapy and shoe gear modifications.

## 2015-11-17 ENCOUNTER — Other Ambulatory Visit: Payer: Self-pay | Admitting: Internal Medicine

## 2015-11-17 ENCOUNTER — Ambulatory Visit: Payer: Medicare Other | Admitting: Podiatry

## 2015-11-18 ENCOUNTER — Other Ambulatory Visit (INDEPENDENT_AMBULATORY_CARE_PROVIDER_SITE_OTHER): Payer: Federal, State, Local not specified - PPO

## 2015-11-18 ENCOUNTER — Other Ambulatory Visit: Payer: Self-pay | Admitting: Internal Medicine

## 2015-11-18 ENCOUNTER — Telehealth: Payer: Self-pay | Admitting: *Deleted

## 2015-11-18 DIAGNOSIS — E034 Atrophy of thyroid (acquired): Secondary | ICD-10-CM | POA: Diagnosis not present

## 2015-11-18 DIAGNOSIS — E038 Other specified hypothyroidism: Secondary | ICD-10-CM | POA: Diagnosis not present

## 2015-11-18 NOTE — Telephone Encounter (Signed)
TSH only

## 2015-11-18 NOTE — Telephone Encounter (Signed)
Labs and dx?  

## 2015-11-19 ENCOUNTER — Other Ambulatory Visit: Payer: Federal, State, Local not specified - PPO

## 2015-11-19 LAB — TSH: TSH: 0.78 u[IU]/mL (ref 0.35–4.50)

## 2015-11-22 ENCOUNTER — Encounter: Payer: Self-pay | Admitting: Internal Medicine

## 2015-11-23 ENCOUNTER — Telehealth: Payer: Self-pay | Admitting: Internal Medicine

## 2015-11-23 NOTE — Telephone Encounter (Signed)
Left message needing refill on oxyCODONE-acetaminophen (PERCOCET) 10-325 MG tablet  Wanting results from her thyroid test

## 2015-11-23 NOTE — Telephone Encounter (Signed)
Medication was last refilled on 1/16.  Please advise refill. thanks

## 2015-11-24 ENCOUNTER — Other Ambulatory Visit: Payer: Self-pay

## 2015-11-24 MED ORDER — OXYCODONE-ACETAMINOPHEN 10-325 MG PO TABS: 1.0000 | ORAL_TABLET | Freq: Three times a day (TID) | ORAL | Status: DC | PRN

## 2015-11-24 NOTE — Telephone Encounter (Signed)
Placed script at front desk ready for pick up.

## 2015-11-24 NOTE — Telephone Encounter (Signed)
Called patient, reviewed TSH with her.

## 2015-11-24 NOTE — Telephone Encounter (Signed)
See chart for thyroid results.  Refilled oxy for April 5 per chart

## 2015-12-07 NOTE — Telephone Encounter (Signed)
Mailed unread message to patient.  

## 2015-12-11 ENCOUNTER — Other Ambulatory Visit: Payer: Self-pay | Admitting: Internal Medicine

## 2015-12-16 ENCOUNTER — Other Ambulatory Visit: Payer: Self-pay | Admitting: Internal Medicine

## 2015-12-20 ENCOUNTER — Telehealth: Payer: Self-pay | Admitting: Internal Medicine

## 2015-12-20 MED ORDER — OXYCODONE-ACETAMINOPHEN 10-325 MG PO TABS: 1.0000 | ORAL_TABLET | Freq: Three times a day (TID) | ORAL | Status: DC | PRN

## 2015-12-20 NOTE — Telephone Encounter (Signed)
Last refill was on 11/24/15, please advise? thanks

## 2015-12-20 NOTE — Telephone Encounter (Signed)
Pt was wondering if Dr. Derrel Nip would write her a refill on percocet since  her husband will be in town and can pick up the prescription.

## 2015-12-20 NOTE — Telephone Encounter (Signed)
Yes, will Refill for 30 days only.  OFFICE VISIT NEEDED prior to any more refills

## 2015-12-20 NOTE — Telephone Encounter (Signed)
Spoke with the patient, verbalized understanding for f/u appt for more refills.  Husband will pick it up. thanks

## 2016-01-04 ENCOUNTER — Other Ambulatory Visit: Payer: Self-pay

## 2016-01-04 MED ORDER — MONTELUKAST SODIUM 10 MG PO TABS: 10.0000 mg | ORAL_TABLET | Freq: Every day | ORAL | Status: DC

## 2016-01-10 ENCOUNTER — Other Ambulatory Visit: Payer: Self-pay

## 2016-01-10 ENCOUNTER — Encounter: Payer: Self-pay | Admitting: Internal Medicine

## 2016-01-10 ENCOUNTER — Ambulatory Visit (INDEPENDENT_AMBULATORY_CARE_PROVIDER_SITE_OTHER): Payer: Federal, State, Local not specified - PPO | Admitting: Internal Medicine

## 2016-01-10 VITALS — BP 156/72 | HR 89 | Temp 98.2°F | Resp 16 | Ht 63.0 in | Wt 233.5 lb

## 2016-01-10 DIAGNOSIS — M549 Dorsalgia, unspecified: Secondary | ICD-10-CM

## 2016-01-10 DIAGNOSIS — F1120 Opioid dependence, uncomplicated: Secondary | ICD-10-CM

## 2016-01-10 DIAGNOSIS — G8929 Other chronic pain: Secondary | ICD-10-CM

## 2016-01-10 DIAGNOSIS — N342 Other urethritis: Secondary | ICD-10-CM

## 2016-01-10 DIAGNOSIS — R300 Dysuria: Secondary | ICD-10-CM | POA: Diagnosis not present

## 2016-01-10 LAB — POCT URINALYSIS DIPSTICK
BILIRUBIN UA: NEGATIVE
Glucose, UA: NEGATIVE
KETONES UA: NEGATIVE
Nitrite, UA: NEGATIVE
PH UA: 6.5
RBC UA: NEGATIVE
Spec Grav, UA: 1.01
Urobilinogen, UA: 0.2

## 2016-01-10 MED ORDER — MONTELUKAST SODIUM 10 MG PO TABS: 10.0000 mg | ORAL_TABLET | Freq: Every day | ORAL | Status: AC

## 2016-01-10 MED ORDER — OXYCODONE-ACETAMINOPHEN 10-325 MG PO TABS: 1.0000 | ORAL_TABLET | Freq: Three times a day (TID) | ORAL | Status: DC | PRN

## 2016-01-10 MED ORDER — FOSFOMYCIN TROMETHAMINE 3 G PO PACK: 3.0000 g | PACK | Freq: Once | ORAL | Status: DC

## 2016-01-10 MED ORDER — ROSUVASTATIN CALCIUM 20 MG PO TABS: ORAL_TABLET | ORAL | Status: DC

## 2016-01-10 NOTE — Progress Notes (Signed)
Subjective:  Patient ID: [name], female    DOB: 21-Oct-1940  Age: 75 y.o. MRN: [id number]  CC: The primary encounter diagnosis was Dysuria. Diagnoses of Infective urethritis, Chronic back pain greater than 3 months duration, and Chronic narcotic dependence ([place]) were also pertinent to this visit.  HPI [name] presents for management of chronic back pain and new onset dysuria.  She has a history of chronic back pain secondary to spinal stenosis with prior L2-L5 interbody  Fusion, but since last September has had worsening of pain with numbness and tingling radiating to left legg.  She had a myelogram done in Oct 2016 which showed no loosening of hardware or recurrent stenosis .   She manages her pain with use of generic percocet up to 3 daily for severe back pain.  She has been back to her neurosurgeon and is starting PT for lower back May 31 ordered by Arnoldo Morale.  Her pain improves with supine position with legs raised   But is aggravated by housework and lifting. Discussed her chronic use and need to begin weaning down form 3 daily to 2 daily.    Refills for June, July and August given #90 each     Last seen in January , treated for UTI with fosfomycin due to multiple  drug allergies .  Worked well, tolerated medications.  Symptoms currently have been present for a week, and include dysuria and frequency.    Outpatient Prescriptions Prior to Visit  Medication Sig Dispense Refill  . AMBIEN 5 MG tablet TAKE 1 TABLET AT BEDTIME AS NEEDED FOR SLEEP 30 tablet 2  . amLODipine (NORVASC) 10 MG tablet Take 10 mg by mouth daily. Reported on 01/10/2016    . Ascorbic Acid (VITAMIN C) 1000 MG tablet Take 500 mg by mouth daily.     . benzonatate (TESSALON) 200 MG capsule TAKE 1 CAPSULE (200 MG TOTAL) BY MOUTH 3 (THREE) TIMES DAILY AS NEEDED FOR COUGH. 60 capsule 0  . ciprofloxacin (CIPRO) 750 MG tablet TAKE 1 TABLET (750 MG TOTAL) BY MOUTH TWO (2) TIMES A DAY. FOR 15 DAYS  1  . CLARINEX 5 MG tablet  TAKE 1 TABLET (5 MG TOTAL) BY MOUTH EVERY MORNING. 90 tablet 4  . Coenzyme Q10 100 MG TABS Take 1 tablet by mouth daily.    Marland Kitchen COZAAR 50 MG tablet TAKE 1 TABLET BY MOUTH TWICE A DAY 180 tablet 4  . cyanocobalamin (,VITAMIN B-12,) 1000 MCG/ML injection FOR USE WITH MONTHLY B12 INJECTIONS 10 mL 1  . doxepin (SINEQUAN) 25 MG capsule Take 2 capsules (50 mg total) by mouth at bedtime. 30 capsule 5  . doxepin (SINEQUAN) 25 MG capsule TAKE 2 CAPSULES (50 MG TOTAL) BY MOUTH AT BEDTIME. 180 capsule 1  . escitalopram (LEXAPRO) 10 MG tablet TAKE 1 TABLET (10 MG TOTAL) BY MOUTH DAILY. 30 tablet 10  . FeFum-FePo-FA-B Cmp-C-Zn-Mn-Cu (SE-TAN PLUS) 162-115.2-1 MG CAPS TAKE 1 CAPSULE BY MOUTH DAILY. 30 capsule 10  . fluticasone (FLONASE) 50 MCG/ACT nasal spray     . isosorbide mononitrate (IMDUR) 60 MG 24 hr tablet TAKE 1 TABLET (60 MG TOTAL) BY MOUTH DAILY. 90 tablet 4  . LEXAPRO 10 MG tablet TAKE 1 TABLET (10 MG TOTAL) BY MOUTH DAILY. 30 tablet 10  . LIDODERM 5 % PLACE 3 PATCHES ONTO SKIN DAILY AND DISCARD WITH 12 HOURS OR AS DIRECTED 90 patch 5  . LOVAZA 1 G capsule TAKE 2 CAPSULES (2 G TOTAL) BY MOUTH 2 (  TWO) TIMES DAILY. 360 capsule 3  . NEEDLE, DISP, 23 G (BD DISP NEEDLE) 23G X 1" MISC Uses to inject b12 into muscle once monthly 12 each 1  . NORVASC 10 MG tablet TAKE 1 TABLET (10 MG TOTAL) BY MOUTH DAILY. 30 tablet 5  . nystatin (MYCOSTATIN) 100000 UNIT/ML suspension TAKE 5 MLS (500,000 UNITS TOTAL) BY MOUTH 4 (FOUR) TIMES DAILY. 120 mL 0  . nystatin (MYCOSTATIN) powder Apply topically 2 (two) times daily. Until rash has resolved 15 g 0  . OVER THE COUNTER MEDICATION Take 1 capsule by mouth daily. Florify    . phentermine (ADIPEX-P) 37.5 MG tablet 1/2 tablet once or twice daily for appetite suppression 30 tablet 2  . polyethylene glycol powder (GLYCOLAX/MIRALAX) powder 255 grams one bottle for colonoscopy prep 255 g 0  . Prenatal Vit-Fe Fumarate-FA (PRENATAL 1 PLUS 1 PO) Take 1 tablet by mouth daily.    Marland Kitchen  PREVIDENT 5000 DRY MOUTH 1.1 % GEL dental gel     . pyridOXINE (VITAMIN B-6) 100 MG tablet Take 100 mg by mouth daily.    . sodium chloride (OCEAN) 0.65 % nasal spray Place 2 sprays into the nose as needed. Allergies      . spironolactone (ALDACTONE) 25 MG tablet TAKE 1 TABLET EVERY DAY 30 tablet 10  . SYNTHROID 137 MCG tablet Take 1 tablet (137 mcg total) by mouth daily before breakfast. 90 tablet 1  . SYNTHROID 150 MCG tablet     . tobramycin (NEBCIN) 80 MG/2ML SOLN injection     . Vitamin D, Ergocalciferol, (DRISDOL) 50000 units CAPS capsule TAKE 1 CAPSULE (50,000 UNITS TOTAL) BY MOUTH EVERY 7 (SEVEN) DAYS. 12 capsule 0  . VOLTAREN 1 % GEL     . CRESTOR 20 MG tablet TAKE 1 TABLET (20 MG TOTAL) BY MOUTH DAILY. 30 tablet 0  . fosfomycin (MONUROL) 3 g PACK Take 3 g by mouth once. 1 g 0  . montelukast (SINGULAIR) 10 MG tablet Take 1 tablet (10 mg total) by mouth at bedtime. 30 tablet 6  . oxyCODONE-acetaminophen (PERCOCET) 10-325 MG tablet Take 1 tablet by mouth every 8 (eight) hours as needed for pain. May refill on or after October 24 2015 90 tablet 0  . oxyCODONE-acetaminophen (PERCOCET) 10-325 MG tablet Take 1 tablet by mouth every 8 (eight) hours as needed for pain. May refill on or after Dec 24 2015 90 tablet 0  . VALIUM 5 MG tablet TAKE 1 TABLET TWICE A DAY 60 tablet 1  . budesonide-formoterol (SYMBICORT) 80-4.5 MCG/ACT inhaler Inhale 2 puffs into the lungs 2 (two) times daily. (Patient not taking: Reported on 01/10/2016) 1 Inhaler 12   Facility-Administered Medications Prior to Visit  Medication Dose Route Frequency Provider Last Rate Last Dose  . chlorhexidine (HIBICLENS) 4 % liquid 4 application  60 mL Topical Once Gaynelle Arabian, MD        Review of Systems;  Patient denies headache, fevers, malaise, unintentional weight loss, skin rash, eye pain, sinus congestion and sinus pain, sore throat, dysphagia,  hemoptysis , cough, dyspnea, wheezing, chest pain, palpitations, orthopnea, edema,  abdominal pain, nausea, melena, diarrhea, constipation, flank pain, dysuria, hematuria, urinary  Frequency, nocturia, numbness, tingling, seizures,  Focal weakness, Loss of consciousness,  Tremor, insomnia, depression, anxiety, and suicidal ideation.      Objective:  BP 156/72 mmHg  Pulse 89  Temp(Src) 98.2 F (36.8 C) (Oral)  Resp 16  Ht 5\' 3"  (1.6 m)  Wt 233 lb 8 oz (  105.915 kg)  BMI 41.37 kg/m2  SpO2 95%  BP Readings from Last 3 Encounters:  01/10/16 156/72  11/15/15 113/56  09/06/15 126/62    Wt Readings from Last 3 Encounters:  01/10/16 233 lb 8 oz (105.915 kg)  09/06/15 227 lb 8 oz (103.193 kg)  05/31/15 221 lb 2 oz (100.302 kg)    General appearance: alert, cooperative and appears stated age Neck: no adenopathy, no carotid bruit, supple, symmetrical, trachea midline and thyroid not enlarged, symmetric, no tenderness/mass/nodules Back: symmetric, no curvature. ROM normal. No CVA tenderness. Lungs: occasional wheezes, otherwise clear to auscultation bilaterally Heart: regular rate and rhythm, S1, S2 normal, no murmur, click, rub or gallop Abdomen: soft, non-tender; bowel sounds normal; no masses,  no organomegaly Pulses: 2+ and symmetric Skin: Skin color, texture, turgor normal. No rashes or lesions Lymph nodes: Cervical, supraclavicular, and axillary nodes normal.   Ct Lumbar Spine W Contrast  05/11/2015  CLINICAL DATA:  Progressive chronic lumbago/low back pain following prior fusions. EXAM: LUMBAR MYELOGRAM FLUOROSCOPY TIME:  1 minutes 39 seconds Fifteen fluoroscopic spot films were obtained. PROCEDURE: After thorough discussion of risks and benefits of the procedure including bleeding, infection, injury to nerves, blood vessels, adjacent structures as well as headache and CSF leak, written and oral informed consent was obtained. Consent was obtained by Dr. Dereck Ligas. Time out form was completed. Patient was positioned prone on the fluoroscopy table. Local  anesthesia was provided with 1% lidocaine without epinephrine after prepped and draped in the usual sterile fashion. Puncture was performed at L3 using a 3 1/2 inch spinal via midline approach. Using a single pass through the dura, the needle was placed within the thecal sac, with return of clear CSF. 15 mL of Omnipaque-180 was injected into the thecal sac, with normal opacification of the nerve roots and cauda equina consistent with free flow within the subarachnoid space. I personally performed the lumbar puncture and administered the intrathecal contrast. I also personally supervised acquisition of the myelogram images. TECHNIQUE: Contiguous axial images were obtained through the Lumbar spine after the intrathecal infusion of infusion. Coronal and sagittal reconstructions were obtained of the axial image sets. COMPARISON:  None FINDINGS: LUMBAR MYELOGRAM FINDINGS: L2 through L5 posterior lumbar interbody fusion with interconnecting rod at L3. On the standing upright images, there is a mild levoconvex curve of the thoracolumbar spine. There is no recurrent spinal stenosis at the fusion levels. No pathologic motion is identified with flexion and extension maneuvers. There is little if any motion in the lumbar spine with most of the motion occurring at the hips. Grade I retrolisthesis of L1 on L2 measures 2 mm. CT LUMBAR MYELOGRAM FINDINGS: Segmentation: 5 lumbar type vertebral bodies. PLIF from L2 through L5. Alignment:  Mild levoconvex curve with the apex at L2. Vertebrae: No aggressive osseous lesions. Vertebral body height is preserved. Conus medullaris: Normal termination at L1. Paraspinal tissues: Dense atherosclerosis. Superior mesenteric artery stent. Bilateral sacroiliac joint degenerative disease. Dysmorphic appearance of the medial RIGHT iliac bone, probably representing a bone graft harvesting site. This appears unchanged from 12/25/2014. Disc levels: T11-T12: Shallow circumferential disc bulge without  stenosis. Mild RIGHT facet arthrosis. T12-L1:  Negative. L1-L2: Small RIGHT central disc protrusion barely indenting the thecal sac. Mild bilateral facet arthrosis. The foramina appear adequately patent. Central canal is patent. L2-L3: Laminectomy with decompression. Clumping of nerve roots consistent with arachnoiditis in the posterior RIGHT central canal. This continues inferiorly. No recurrent stenosis. No hardware complication from posterior rod and screw fixation.  No findings of pseudoarthrosis. Good incorporation of interbody bone graft with probable tiny amount of bridging bone centrally. L3-L4: Solid fusion. No hardware complication. No recurrent stenosis. Wide posterior decompression. L4-L5: Solid fusion. No complicating features. No hardware complication. Wide posterior decompression. No recurrent stenosis. L5-S1: Solid posterolateral fusion. No stenosis. LEFT laminectomy with decompression of the central canal. IMPRESSION: 1. Technically successful lumbar puncture for lumbar myelogram. 2. L2 through L5 posterior lumbar interbody fusion. Good incorporation of bone graft at L2-L3 with a probable tiny amount of central bridging bone. No findings of pseudoarthrosis. 3. Solid fusion from L3 through S1 without recurrent stenosis. 4. Mild L1-L2 degenerative disc disease with small RIGHT paracentral protrusion but no significant stenosis. 5. Arachnoiditis. Electronically Signed   By: Dereck Ligas M.D.   On: 05/11/2015 13:03   Dg Myelography Lumbar Inj Lumbosacral  05/11/2015  CLINICAL DATA:  Progressive chronic lumbago/low back pain following prior fusions. EXAM: LUMBAR MYELOGRAM FLUOROSCOPY TIME:  1 minutes 39 seconds Fifteen fluoroscopic spot films were obtained. PROCEDURE: After thorough discussion of risks and benefits of the procedure including bleeding, infection, injury to nerves, blood vessels, adjacent structures as well as headache and CSF leak, written and oral informed consent was obtained.  Consent was obtained by Dr. Dereck Ligas. Time out form was completed. Patient was positioned prone on the fluoroscopy table. Local anesthesia was provided with 1% lidocaine without epinephrine after prepped and draped in the usual sterile fashion. Puncture was performed at L3 using a 3 1/2 inch spinal via midline approach. Using a single pass through the dura, the needle was placed within the thecal sac, with return of clear CSF. 15 mL of Omnipaque-180 was injected into the thecal sac, with normal opacification of the nerve roots and cauda equina consistent with free flow within the subarachnoid space. I personally performed the lumbar puncture and administered the intrathecal contrast. I also personally supervised acquisition of the myelogram images. TECHNIQUE: Contiguous axial images were obtained through the Lumbar spine after the intrathecal infusion of infusion. Coronal and sagittal reconstructions were obtained of the axial image sets. COMPARISON:  None FINDINGS: LUMBAR MYELOGRAM FINDINGS: L2 through L5 posterior lumbar interbody fusion with interconnecting rod at L3. On the standing upright images, there is a mild levoconvex curve of the thoracolumbar spine. There is no recurrent spinal stenosis at the fusion levels. No pathologic motion is identified with flexion and extension maneuvers. There is little if any motion in the lumbar spine with most of the motion occurring at the hips. Grade I retrolisthesis of L1 on L2 measures 2 mm. CT LUMBAR MYELOGRAM FINDINGS: Segmentation: 5 lumbar type vertebral bodies. PLIF from L2 through L5. Alignment:  Mild levoconvex curve with the apex at L2. Vertebrae: No aggressive osseous lesions. Vertebral body height is preserved. Conus medullaris: Normal termination at L1. Paraspinal tissues: Dense atherosclerosis. Superior mesenteric artery stent. Bilateral sacroiliac joint degenerative disease. Dysmorphic appearance of the medial RIGHT iliac bone, probably representing a  bone graft harvesting site. This appears unchanged from 12/25/2014. Disc levels: T11-T12: Shallow circumferential disc bulge without stenosis. Mild RIGHT facet arthrosis. T12-L1:  Negative. L1-L2: Small RIGHT central disc protrusion barely indenting the thecal sac. Mild bilateral facet arthrosis. The foramina appear adequately patent. Central canal is patent. L2-L3: Laminectomy with decompression. Clumping of nerve roots consistent with arachnoiditis in the posterior RIGHT central canal. This continues inferiorly. No recurrent stenosis. No hardware complication from posterior rod and screw fixation. No findings of pseudoarthrosis. Good incorporation of interbody bone graft with probable tiny  amount of bridging bone centrally. L3-L4: Solid fusion. No hardware complication. No recurrent stenosis. Wide posterior decompression. L4-L5: Solid fusion. No complicating features. No hardware complication. Wide posterior decompression. No recurrent stenosis. L5-S1: Solid posterolateral fusion. No stenosis. LEFT laminectomy with decompression of the central canal. IMPRESSION: 1. Technically successful lumbar puncture for lumbar myelogram. 2. L2 through L5 posterior lumbar interbody fusion. Good incorporation of bone graft at L2-L3 with a probable tiny amount of central bridging bone. No findings of pseudoarthrosis. 3. Solid fusion from L3 through S1 without recurrent stenosis. 4. Mild L1-L2 degenerative disc disease with small RIGHT paracentral protrusion but no significant stenosis. 5. Arachnoiditis. Electronically Signed   By: Dereck Ligas M.D.   On: 05/11/2015 13:03    Assessment & Plan:   Problem List Items Addressed This Visit    Chronic back pain greater than 3 months duration    Discussed with patient the need to start weaning her from chronic daily use of percocet given the normal myelogram and CT that were done last October.  She will starts PT at the end of the month and will return in 3 months . Refills  given .        Relevant Medications   oxyCODONE-acetaminophen (PERCOCET) 10-325 MG tablet   oxyCODONE-acetaminophen (PERCOCET) 10-325 MG tablet   UTI (urinary tract infection)    Recurrent , last one in January.  Will repeat fosfomycin empirically pending cultures.       Relevant Medications   fosfomycin (MONUROL) 3 g PACK   Other Relevant Orders   Urinalysis, Routine w reflex microscopic (not at [facility])   Urine culture   Chronic narcotic dependence ([place])    Refills on valium denied due to concurrent use of Percocet for back pain.  Need for eventual weaning discussed with patient today  She will return in 3 months       Other Visit Diagnoses    Dysuria    -  Primary    Relevant Orders    POCT Urinalysis Dipstick (Completed)    Urinalysis, Routine w reflex microscopic (not at [facility])    Urine culture       I have discontinued [name]'s VALIUM and oxyCODONE-acetaminophen. I have also changed her oxyCODONE-acetaminophen and oxyCODONE-acetaminophen. Additionally, I am having her maintain her sodium chloride, NEEDLE (DISP) 23 G, doxepin, vitamin C, OVER THE COUNTER MEDICATION, amLODipine, Prenatal Vit-Fe Fumarate-FA (PRENATAL 1 PLUS 1 PO), pyridOXINE, Coenzyme Q10, polyethylene glycol powder, budesonide-formoterol, nystatin, isosorbide mononitrate, CLARINEX, SE-TAN PLUS, spironolactone, COZAAR, LEXAPRO, LOVAZA, escitalopram, phentermine, NORVASC, nystatin, SYNTHROID, cyanocobalamin, LIDODERM, AMBIEN, ciprofloxacin, VOLTAREN, fluticasone, SYNTHROID, PREVIDENT 5000 DRY MOUTH, tobramycin, doxepin, benzonatate, Vitamin D (Ergocalciferol), and fosfomycin.  Meds ordered this encounter  Medications  . fosfomycin (MONUROL) 3 g PACK    Sig: Take 3 g by mouth once.    Dispense:  1 g    Refill:  0  . oxyCODONE-acetaminophen (PERCOCET) 10-325 MG tablet    Sig: Take 1 tablet by mouth every 8 (eight) hours as needed for pain. May refill on or after January 24 2016    Dispense:  90 tablet    Refill:  0    . DISCONTD: oxyCODONE-acetaminophen (PERCOCET) 10-325 MG tablet    Sig: Take 1 tablet by mouth every 8 (eight) hours as needed for pain. May refill on or after February 23 2016    Dispense:  90 tablet    Refill:  0  . oxyCODONE-acetaminophen (PERCOCET) 10-325 MG tablet    Sig: Take 1  tablet by mouth every 8 (eight) hours as needed for pain. May refill on or after March 25 2016    Dispense:  90 tablet    Refill:  0   A total of 25 minutes of face to face time was spent with patient more than half of which was spent in counselling about the above mentioned conditions  and coordination of care  Medications Discontinued During This Encounter  Medication Reason  . fosfomycin (MONUROL) 3 g PACK Reorder  . oxyCODONE-acetaminophen (PERCOCET) 10-325 MG tablet Reorder  . oxyCODONE-acetaminophen (PERCOCET) 10-325 MG tablet Reorder  . oxyCODONE-acetaminophen (PERCOCET) 10-325 MG tablet Reorder  . VALIUM 5 MG tablet     Follow-up: No Follow-up on file.   Crecencio Mc, MD

## 2016-01-10 NOTE — Patient Instructions (Addendum)
Fosfomycin refilled for treatment of UTI  Culture pending;  Will advise you if additional antibiotics are needed   Refills of percocet for June July and August are give  Please try to use as few as possible   Return for fasting labs in early August

## 2016-01-11 DIAGNOSIS — F1120 Opioid dependence, uncomplicated: Secondary | ICD-10-CM | POA: Insufficient documentation

## 2016-01-11 LAB — URINALYSIS, ROUTINE W REFLEX MICROSCOPIC
BILIRUBIN URINE: NEGATIVE
HGB URINE DIPSTICK: NEGATIVE
KETONES UR: NEGATIVE
NITRITE: NEGATIVE
RBC / HPF: NONE SEEN (ref 0–?)
Specific Gravity, Urine: <=1.005 — AB (ref 1.000–1.030)
Urine Glucose: NEGATIVE
Urobilinogen, UA: 0.2 (ref 0.0–1.0)
pH: 6.5 (ref 5.0–8.0)

## 2016-01-11 NOTE — Assessment & Plan Note (Signed)
Refills on valium denied due to concurrent use of Percocet for back pain.  Need for eventual weaning discussed with patient today  She will return in 3 months

## 2016-01-11 NOTE — Assessment & Plan Note (Signed)
Recurrent , last one in January.  Will repeat fosfomycin empirically pending cultures.

## 2016-01-11 NOTE — Assessment & Plan Note (Signed)
Discussed with patient the need to start weaning her from chronic daily use of percocet given the normal myelogram and CT that were done last October.  She will starts PT at the end of the month and will return in 3 months . Refills given .

## 2016-01-12 ENCOUNTER — Encounter: Payer: Self-pay | Admitting: Internal Medicine

## 2016-01-12 LAB — URINE CULTURE
COLONY COUNT: NO GROWTH
ORGANISM ID, BACTERIA: NO GROWTH

## 2016-01-19 ENCOUNTER — Ambulatory Visit: Payer: Federal, State, Local not specified - PPO | Admitting: Physical Therapy

## 2016-01-24 ENCOUNTER — Encounter: Payer: Federal, State, Local not specified - PPO | Admitting: Physical Therapy

## 2016-01-25 ENCOUNTER — Other Ambulatory Visit: Payer: Self-pay | Admitting: Internal Medicine

## 2016-01-26 ENCOUNTER — Encounter: Payer: Federal, State, Local not specified - PPO | Admitting: Physical Therapy

## 2016-01-31 ENCOUNTER — Encounter: Payer: Federal, State, Local not specified - PPO | Admitting: Physical Therapy

## 2016-02-02 ENCOUNTER — Encounter: Payer: Federal, State, Local not specified - PPO | Admitting: Physical Therapy

## 2016-02-03 ENCOUNTER — Other Ambulatory Visit: Payer: Self-pay | Admitting: Internal Medicine

## 2016-02-07 ENCOUNTER — Ambulatory Visit: Payer: Federal, State, Local not specified - PPO | Admitting: Physical Therapy

## 2016-02-09 ENCOUNTER — Encounter: Payer: Federal, State, Local not specified - PPO | Admitting: Physical Therapy

## 2016-02-14 ENCOUNTER — Encounter: Payer: Federal, State, Local not specified - PPO | Admitting: Physical Therapy

## 2016-02-15 ENCOUNTER — Other Ambulatory Visit: Payer: Self-pay | Admitting: Internal Medicine

## 2016-02-16 ENCOUNTER — Encounter: Payer: Federal, State, Local not specified - PPO | Admitting: Physical Therapy

## 2016-02-21 ENCOUNTER — Encounter: Payer: Federal, State, Local not specified - PPO | Admitting: Physical Therapy

## 2016-02-23 ENCOUNTER — Other Ambulatory Visit: Payer: Self-pay | Admitting: Internal Medicine

## 2016-02-23 ENCOUNTER — Encounter: Payer: Federal, State, Local not specified - PPO | Admitting: Physical Therapy

## 2016-02-25 ENCOUNTER — Other Ambulatory Visit: Payer: Self-pay | Admitting: Internal Medicine

## 2016-02-25 NOTE — Telephone Encounter (Signed)
refilled 11/09/15 and patient last seen on 01/10/16. Please advise?

## 2016-02-25 NOTE — Telephone Encounter (Signed)
REFILLED

## 2016-02-27 ENCOUNTER — Other Ambulatory Visit: Payer: Self-pay | Admitting: Internal Medicine

## 2016-02-28 ENCOUNTER — Encounter: Payer: Federal, State, Local not specified - PPO | Admitting: Physical Therapy

## 2016-03-01 ENCOUNTER — Encounter: Payer: Federal, State, Local not specified - PPO | Admitting: Physical Therapy

## 2016-03-06 ENCOUNTER — Encounter: Payer: Federal, State, Local not specified - PPO | Admitting: Internal Medicine

## 2016-03-08 ENCOUNTER — Telehealth: Payer: Self-pay | Admitting: *Deleted

## 2016-03-08 ENCOUNTER — Encounter: Payer: Self-pay | Admitting: Physical Therapy

## 2016-03-08 ENCOUNTER — Ambulatory Visit: Payer: Federal, State, Local not specified - PPO | Attending: Neurosurgery | Admitting: Physical Therapy

## 2016-03-08 DIAGNOSIS — M545 Low back pain, unspecified: Secondary | ICD-10-CM

## 2016-03-08 MED ORDER — VITAMIN D (ERGOCALCIFEROL) 1.25 MG (50000 UNIT) PO CAPS: ORAL_CAPSULE | ORAL | Status: DC

## 2016-03-08 MED ORDER — BENZONATATE 200 MG PO CAPS: ORAL_CAPSULE | ORAL | Status: DC

## 2016-03-08 NOTE — Therapy (Signed)
[place] MAIN [facility] SERVICES [address] [place], [place], [address] Phone: [phone]   Fax:  [phone]  Physical Therapy Evaluation  Patient Details  Name: [name] MRN: [id number] Date of Birth: Jun 23, 1941 Referring Provider: Newman Pies  Encounter Date: 03/08/2016    Past Medical History  Diagnosis Date  . Degenerative disc disease     HERNITAED DISC BY MRI ON 07/07;S/P LAMINECTOMY,RID REPLACEMENT  . Brain aneurysm     S/P RESECTION  . Discoid lupus erythematosus eyelid     NEGATIVE SEROLOGIC MARKERS  . Hypercholesterolemia   . Chronic sinusitis   . Hypertension   . Dysrhythmia     PVC's occ.  . Pneumonia 05/30/2010    after receiving Propofol  . Headache(784.0)     migraines occ.  . CHF (congestive heart failure) ([place]) 05/2010    after receiving Propofol  . Hypothyroidism     takes Synthroid  . Blood transfusion 2009    back surgery  . Asthma 06/2010    depending on what around  . Complication of anesthesia 05/30/2010    from colonoscopy-had Propofol-had a  . Complication of anesthesia     reaction-went into asthma-pneumonia,-  . Complication of anesthesia     then CHF and to ICU  . Sleep apnea     uses CPAP-8.4-oxygen suppliment  . Renal insufficiency     Patient states she has been told she has Stage I kidney disease  . Systemic lupus erythematosus ([place]) 05/15/2012    Past Surgical History  Procedure Laterality Date  . Cosmetic surgery    . Tonsillectomy  1963  . Back surgery  1968,1988,2002,2009    4 laminectomies and fusions  . Abdominal hysterectomy  1988    uterus only  . Brain surgery  2005    coiling of cranial aneurysum  . Excision morton's neuroma  1990    left foot  . Nasal sinus surgery  1977-2008  . Lipomas  1975    3 from right breast  . Tympanic membrane repair  2009-last one    x5 times  . Total knee arthroplasty  07/10/2011    Procedure: TOTAL KNEE ARTHROPLASTY;  Surgeon:  Gearlean Alf;  Location: [facility];  Service: Orthopedics;  Laterality: Right;  . Joint replacement  2013    Right knee,  [place]  . Eye surgery      both cataracts -[place] 7/14  . Foot arthrodesis Right 05/22/2013    Procedure: RIGHT HALLUX METATARSAL PHALANGEAL JOINT ARTHRODESIS ;  Surgeon: Wylene Simmer, MD;  Location: [place];  Service: Orthopedics;  Laterality: Right;  . Right heart catheterization N/A 11/28/2013    Procedure: RIGHT HEART CATH;  Surgeon: Jolaine Artist, MD;  Location: [facility] CATH LAB;  Service: Cardiovascular;  Laterality: N/A;  . Colonoscopy  2008  . Colonoscopy with propofol N/A 01/13/2015    Procedure: COLONOSCOPY WITH PROPOFOL;  Surgeon: Christene Lye, MD;  Location: [facility] ENDOSCOPY;  Service: Endoscopy;  Laterality: N/A;  . Peripheral vascular catheterization N/A 01/25/2015    Procedure: Visceral Angiography;  Surgeon: Algernon Huxley, MD;  Location: [place] CV LAB;  Service: Cardiovascular;  Laterality: N/A;  . Colonoscopy with propofol N/A 03/24/2015    Procedure: COLONOSCOPY WITH PROPOFOL;  Surgeon: Christene Lye, MD;  Location: [facility] ENDOSCOPY;  Service: Endoscopy;  Laterality: N/A;    There were no vitals filed for this visit.       Subjective Assessment - 03/08/16  1139    Subjective Patient has chronic back and she has had 5 surgeries on her back and the last one was 2009. She is coming for treatment for a TENS unit.    Pertinent History [place] was on a vent for 2 weeks she had pneumonia, TKR RLE,    Limitations Sitting;Lifting;Standing;Walking   How long can you sit comfortably? 3 hours   How long can you stand comfortably? 1 hour   How long can you walk comfortably? 15 minutes   Patient Stated Goals have less pain.    Currently in Pain? Yes   Pain Score 8    Pain Location Back   Pain Descriptors / Indicators Aching   Pain Type Chronic pain   Pain Frequency Constant   Aggravating Factors  standing, walking, lifting, carrying    Pain Relieving Factors heat, rest            [facility] PT Assessment - 03/08/16 0001    Assessment   Medical Diagnosis low back pain   Referring Provider JENKINS, JEFFREY   Onset Date/Surgical Date 01/26/16   Hand Dominance Right   Prior Therapy --  11 years ago   Precautions   Precautions Fall   Restrictions   Weight Bearing Restrictions No   Balance Screen   Has the patient fallen in the past 6 months Yes   How many times? 1   Has the patient had a decrease in activity level because of a fear of falling?  Yes   Is the patient reluctant to leave their home because of a fear of falling?  No   Home Environment   Living Environment Private residence   Living Arrangements Spouse/significant other   Available Help at Discharge Family   Type of [place] to enter   Entrance Stairs-Number of Steps 5   Entrance Stairs-Rails Right;Left;Can reach both   Walker Two level   Alternate Level Stairs-Number of Steps 12   Alternate [place] - 2 wheels;Cane - single point;Wheelchair - manual   Prior Function   Level of Independence Independent   Vocation Retired   Teacher, music, sew, bake   Cognition   Overall Cognitive Status Within Functional Limits for tasks assessed   Attention Focused      PAIN: 8 /10 low back  POSTURE: WFL   PROM/AROM: WFL trunk is painful with flex/ext/ SBL/SBR  STRENGTH:  Graded on a 0-5 scale Muscle Group Left Right  Shoulder flex    Shoulder Abd    Shoulder Ext    Shoulder IR/ER    Elbow    Wrist/hand    Hip Flex 4/5 4/5  Hip Abd 4/5 3/5  Hip Add    Hip Ext 3/5 3/5  Hip IR/ER    Knee Flex 5/5 5/5  Knee Ext 5/5 5/5  Ankle DF 5/5 5/5  Ankle PF     SENSATION: numbness intermittent LLE hip to toes       FUNCTIONAL MOBILITY: guarded     GAIT: ambulates without AD and has antalgic gait with limp  OUTCOME MEASURES: TEST Outcome Interpretation  5 times sit<>stand  11.86sec >60 yo, >15 sec indicates increased risk for falls  10 meter walk test    .91           m/s <1.0 m/s indicates increased risk for falls; limited community ambulator  PT Education - 03/08/16 1142    Education provided Yes   Education Details TENS   Person(s) Educated Patient   Methods Explanation   Comprehension Verbalized understanding             PT Long Term Goals - 03/08/16 1212    PT LONG TERM GOAL #1   Title Patient will understand how to use the TENS unit for back pain    Time 1   Period Days   Status Achieved               Plan - 03/08/16 1200    Clinical Impression Statement Patient has chronic low back pain and is here to get a TENS unit for her chronic low back pain. She has difficutly with standing, walking, bending , lifting and carrying.    Rehab Potential Fair   PT Frequency One time visit   Consulted and Agree with Plan of Care Patient      Patient will benefit from skilled therapeutic intervention in order to improve the following deficits and impairments:  Difficulty walking, Pain, Obesity, Impaired flexibility, Decreased activity tolerance  Visit Diagnosis: Bilateral low back pain without sciatica     Problem List Patient Active Problem List   Diagnosis Date Noted  . Chronic narcotic dependence ([place]) 01/11/2016  . Pain in joint, ankle and foot 09/07/2015  . Candidiasis of skin 09/06/2015  . UTI (urinary tract infection) 09/06/2015  . Status post lumbar spinal fusion 06/01/2015  . Major depressive disorder with single episode, in remission ([place]) 06/01/2015  . Chronic rhinitis 05/24/2015  . CAP (community acquired pneumonia) 02/28/2015  . COPD exacerbation ([place]) 02/28/2015  . Cystitis 02/02/2015  . 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 CKD (chronic kidney disease) stage 3, GFR 30-59 ml/min 01/07/2015  . Abdominal pain of unknown cause 01/05/2015  . Coronary  atherosclerosis 01/05/2015  . Aortic atherosclerosis ([place]) 01/05/2015  . Screening for osteoporosis 08/22/2014  . Stress incontinence 04/25/2014  . 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 Murmur 12/31/2013  . Bradycardia 12/31/2013  . Nocturnal hypoxia 10/09/2013  . Shortness of breath 09/23/2013  . History of bacterial pneumonia 09/01/2013  . Infection by Legionella pneumophila ([place]) 09/01/2013  . History of tobacco abuse 09/01/2013  . Other and unspecified hyperlipidemia 03/28/2013  . Preoperative clearance 03/28/2013  . Encounter for other preprocedural examination 03/28/2013  . Routine general medical examination at a health care facility 03/27/2013  . OSA on CPAP 03/27/2013  . Cough 03/13/2013  . Insomnia secondary to chronic pain 03/13/2013  . Allergic rhinitis 03/13/2013  . 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 Congestive heart failure ([place]) 05/15/2012  . Clinical depression 05/15/2012  . Hearing loss of left ear 05/15/2012  . Adult hypothyroidism 05/15/2012  . Systemic lupus erythematosus ([place]) 05/15/2012  . Obesity (BMI 30-39.9) 03/10/2012  . Monoclonal gammopathy of undetermined significance 03/10/2012  . Anemia 01/01/2012  . Hypothyroidism 01/01/2012  . Osteoarthritis of right knee 07/12/2011  . Brain aneurysm 04/18/2011  . Chronic back pain greater than 3 months duration 04/18/2011  . Breast screening, unspecified 04/18/2011  . Encounter for other screening for malignant neoplasm of breast 04/18/2011  . Hypertension 04/07/2011  Alanson Puls, PT, DPT  [place], Minette Headland S 03/08/2016, 12:14 PM  [place] MAIN [facility] SERVICES [address] [place], [place], [address] Phone: [phone]   Fax:  [phone]  Name: [name] MRN: [id number] Date of  Birth: 1941-04-21

## 2016-03-08 NOTE — Telephone Encounter (Signed)
Requesting Benzonatate 200mg -Take 1 capsule by mouth 3 times daily as needed for cough.                    Vit D2 50,000-Take 1 capsule by mouth every days. Last refill:02/23/16;#60,0                12/16/15;12,0 Last OV:01/10/16 Please advise.//AB/CMA

## 2016-03-08 NOTE — Telephone Encounter (Signed)
IF SHE HAS GONE THROUGH #60 BENZONATATE CAPSULES SINCE July 5 SHE NEEDS TO BE SEEN,  REFILLED FOR AUGUST 5.  Marland Kitchen  VIT D REFILLED

## 2016-03-09 NOTE — Telephone Encounter (Signed)
Attempted to reach patient, line busy not able to leave a message, thanks

## 2016-03-13 ENCOUNTER — Encounter: Payer: Federal, State, Local not specified - PPO | Admitting: Physical Therapy

## 2016-03-14 ENCOUNTER — Other Ambulatory Visit: Payer: Self-pay

## 2016-03-14 MED ORDER — ESCITALOPRAM OXALATE 10 MG PO TABS: 10.0000 mg | ORAL_TABLET | Freq: Every day | ORAL | 10 refills | Status: DC

## 2016-03-14 MED ORDER — SE-TAN PLUS 162-115.2-1 MG PO CAPS: 1.0000 | ORAL_CAPSULE | Freq: Every day | ORAL | 10 refills | Status: DC

## 2016-03-14 NOTE — Telephone Encounter (Signed)
DREFILLED

## 2016-03-14 NOTE — Telephone Encounter (Signed)
LOV 01/10/2016. Renaldo Fiddler, CMA

## 2016-03-15 ENCOUNTER — Encounter: Payer: Federal, State, Local not specified - PPO | Admitting: Physical Therapy

## 2016-03-17 ENCOUNTER — Telehealth: Payer: Self-pay | Admitting: *Deleted

## 2016-03-17 MED ORDER — "SYRINGE/NEEDLE (DISP) 25G X 1"" 3 ML MISC": 1 refills | Status: DC

## 2016-03-17 NOTE — Telephone Encounter (Signed)
Patient called and states that she needs a prescription sent to her pharmacy for needles so she can give herself her B12 injection. Her pharmacy is [facility] in [place].

## 2016-03-17 NOTE — Telephone Encounter (Signed)
Rx sent.t hanks

## 2016-03-20 ENCOUNTER — Encounter: Payer: Federal, State, Local not specified - PPO | Admitting: Physical Therapy

## 2016-03-22 ENCOUNTER — Encounter: Payer: Federal, State, Local not specified - PPO | Admitting: Physical Therapy

## 2016-03-24 ENCOUNTER — Other Ambulatory Visit: Payer: Self-pay | Admitting: *Deleted

## 2016-03-24 NOTE — Telephone Encounter (Signed)
Patient requested a medication refill for oxycodone  

## 2016-03-24 NOTE — Telephone Encounter (Signed)
Patient last filled on 5/22, #90, Please advise refill, thanks

## 2016-03-26 ENCOUNTER — Other Ambulatory Visit: Payer: Self-pay | Admitting: Internal Medicine

## 2016-03-27 ENCOUNTER — Encounter: Payer: Federal, State, Local not specified - PPO | Admitting: Physical Therapy

## 2016-03-28 NOTE — Telephone Encounter (Signed)
Patient was given a refill for her oxycodone on May 22 that was for future Refill on or after March 25 2016.  I cannot replace that.    Please print the following  In a letter format to give to patient  Or you an read it to her :  Dear Ms Batrez:  Given the recent passage of federal legislation to reduce the prescribing of narcotics by physicians, I feel it is necessary to advise or remind my patients of my policy going forward :  1) Prescriptions for narcotics cannot be replaced if lost. 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  2) Recent passage of the STOP law restricts physicians from writing narcotics prescriptions  for management of acute pain to a period of 5 days or less.  This will require an office visit.  3) Refills of narcotics for chronic pain will be reevaluated on a case by case basis by me, and if continued,  Will require office visits every 3 months so that the [place] Controlled Substance database can be reviewed during the visit. This is required to ensure that I do not lose my license.  4) Prescriptions for narcotics  must be picked up by the patient or by an authorized friend/family member; they cannot be faxed to pharmacies.  5) When I am out of the office, narcotics prescriptions will not be refilled by another physician . 6) This letter in no way nullifies the [place] narcotics agreement you have signed in the past.    Regards,   Deborra Medina, MD

## 2016-03-28 NOTE — Telephone Encounter (Signed)
Denied to pharmacy and mailed letter to patient, unable to reach via phone, thanks

## 2016-03-29 ENCOUNTER — Encounter: Payer: Federal, State, Local not specified - PPO | Admitting: Physical Therapy

## 2016-03-29 ENCOUNTER — Ambulatory Visit (INDEPENDENT_AMBULATORY_CARE_PROVIDER_SITE_OTHER): Payer: Federal, State, Local not specified - PPO | Admitting: Internal Medicine

## 2016-03-29 DIAGNOSIS — R072 Precordial pain: Secondary | ICD-10-CM

## 2016-03-29 MED ORDER — NITROGLYCERIN 0.4 MG SL SUBL: 0.4000 mg | SUBLINGUAL_TABLET | SUBLINGUAL | 3 refills | Status: AC | PRN

## 2016-03-29 NOTE — Patient Instructions (Addendum)
Your recent episode of chest pain sounds like a bad attack of gas.  Get some Beano and carry with you,  Take 2 tablets at the begininng of any meal containing  a healthy vegetable.  You can try Mylanta Gas for immediate relief.  If this does not work,    You should take a nitroglycerin

## 2016-03-29 NOTE — Progress Notes (Signed)
Subjective:  Patient ID: [name], female    DOB: 16-Dec-1940  Age: 75 y.o. MRN: [id number]  CC: The encounter diagnosis was Precordial pain.  HPI [name] presents for  Evaluation of an episode SSCP that occurred one week ago .  Occurred 6 hours after eating.  Dinner had been an  Omelette with onions and green peppers.  occurrend as a pain between breasts, , sudden onset, around MN after going to bed,  radiated  To both jaws,  Relieved after excessive burping,  Has had another subsequent episode that occurred  while sitting quietly,.  Has CAD,  Prior stent. Last eval was 2016 after hospitalization  .  Diet reviewed.  She is eating a lot of broccoli and cauliflower,  Handful of nuts daily , eggs,  Protein drinks,  Smoothies with spinach .  She has had weight  loss intentionally, 17 lbs following Dr Remo Lipps Gundry's diet the [employer].    Outpatient Medications Prior to Visit  Medication Sig Dispense Refill  . AMBIEN 5 MG tablet TAKE 1 TABLET BY MOUTH AT BEDTIME AS NEEDED FOR SLEEP 30 tablet 2  . amLODipine (NORVASC) 10 MG tablet Take 10 mg by mouth daily. Reported on 01/10/2016    . Ascorbic Acid (VITAMIN C) 1000 MG tablet Take 500 mg by mouth daily.     . benzonatate (TESSALON) 200 MG capsule TAKE 1 CAPSULE (200 MG TOTAL) BY MOUTH 3 (THREE) TIMES DAILY AS NEEDED FOR COUGH. 60 capsule 0  . CLARINEX 5 MG tablet TAKE 1 TABLET (5 MG TOTAL) BY MOUTH EVERY MORNING. 90 tablet 4  . Coenzyme Q10 100 MG TABS Take 1 tablet by mouth daily.    Marland Kitchen COZAAR 50 MG tablet TAKE 1 TABLET BY MOUTH TWICE A DAY 180 tablet 4  . cyanocobalamin (,VITAMIN B-12,) 1000 MCG/ML injection FOR USE WITH MONTHLY B12 INJECTIONS 10 mL 1  . doxepin (SINEQUAN) 25 MG capsule TAKE 2 CAPSULES (50 MG TOTAL) BY MOUTH AT BEDTIME. 180 capsule 1  . escitalopram (LEXAPRO) 10 MG tablet Take 1 tablet (10 mg total) by mouth daily. 30 tablet 10  . fluticasone (FLONASE) 50 MCG/ACT nasal spray     . isosorbide mononitrate (IMDUR) 60 MG  24 hr tablet TAKE 1 TABLET (60 MG TOTAL) BY MOUTH DAILY. 90 tablet 4  . LEXAPRO 10 MG tablet TAKE 1 TABLET (10 MG TOTAL) BY MOUTH DAILY. 30 tablet 10  . LIDODERM 5 % PLACE 3 PATCHES ONTO SKIN DAILY AND DISCARD WITH 12 HOURS OR AS DIRECTED 90 patch 5  . LOVAZA 1 G capsule TAKE 2 CAPSULES (2 G TOTAL) BY MOUTH 2 (TWO) TIMES DAILY. 360 capsule 3  . montelukast (SINGULAIR) 10 MG tablet Take 1 tablet (10 mg total) by mouth at bedtime. 90 tablet 1  . NEEDLE, DISP, 23 G (BD DISP NEEDLE) 23G X 1" MISC Uses to inject b12 into muscle once monthly 12 each 1  . NORVASC 10 MG tablet TAKE 1 TABLET (10 MG TOTAL) BY MOUTH DAILY. 30 tablet 5  . OVER THE COUNTER MEDICATION Take 1 capsule by mouth daily. Florify    . oxyCODONE-acetaminophen (PERCOCET) 10-325 MG tablet Take 1 tablet by mouth every 8 (eight) hours as needed for pain. May refill on or after March 25 2016 90 tablet 0  . polyethylene glycol powder (GLYCOLAX/MIRALAX) powder 255 grams one bottle for colonoscopy prep 255 g 0  . Prenatal Vit-Fe Fumarate-FA (PRENATAL 1 PLUS 1 PO) Take 1 tablet  by mouth daily.    Marland Kitchen PREVIDENT 5000 DRY MOUTH 1.1 % GEL dental gel     . pyridOXINE (VITAMIN B-6) 100 MG tablet Take 100 mg by mouth daily.    . rosuvastatin (CRESTOR) 20 MG tablet TAKE 1 TABLET (20 MG TOTAL) BY MOUTH DAILY. 90 tablet 1  . sodium chloride (OCEAN) 0.65 % nasal spray Place 2 sprays into the nose as needed. Allergies      . spironolactone (ALDACTONE) 25 MG tablet TAKE 1 TABLET EVERY DAY 30 tablet 10  . SYNTHROID 137 MCG tablet TAKE 1 TABLET (137 MCG TOTAL) BY MOUTH DAILY BEFORE BREAKFAST. 90 tablet 1  . SYRINGE-NEEDLE, DISP, 3 ML 25G X 1" 3 ML MISC Please dispense so patient can administer B12 injections monthly 50 each 1  . tobramycin (NEBCIN) 80 MG/2ML SOLN injection     . VALIUM 5 MG tablet TAKE 1 TABLET TWICE A DAY 60 tablet 1  . Vitamin D, Ergocalciferol, (DRISDOL) 50000 units CAPS capsule TAKE 1 CAPSULE (50,000 UNITS TOTAL) BY MOUTH EVERY 7 (SEVEN)  DAYS. 12 capsule 0  . ciprofloxacin (CIPRO) 750 MG tablet TAKE 1 TABLET (750 MG TOTAL) BY MOUTH TWO (2) TIMES A DAY. FOR 15 DAYS  1  . doxepin (SINEQUAN) 25 MG capsule Take 2 capsules (50 mg total) by mouth at bedtime. 30 capsule 5  . FeFum-FePo-FA-B Cmp-C-Zn-Mn-Cu (SE-TAN PLUS) 162-115.2-1 MG CAPS Take 1 capsule by mouth daily. 30 capsule 10  . fosfomycin (MONUROL) 3 g PACK Take 3 g by mouth once. 1 g 0  . nystatin (MYCOSTATIN) 100000 UNIT/ML suspension TAKE 5 MLS (500,000 UNITS TOTAL) BY MOUTH 4 (FOUR) TIMES DAILY. 120 mL 0  . nystatin (MYCOSTATIN) powder Apply topically 2 (two) times daily. Until rash has resolved 15 g 0  . oxyCODONE-acetaminophen (PERCOCET) 10-325 MG tablet Take 1 tablet by mouth every 8 (eight) hours as needed for pain. May refill on or after January 24 2016 90 tablet 0  . phentermine (ADIPEX-P) 37.5 MG tablet 1/2 tablet once or twice daily for appetite suppression 30 tablet 2  . SYMBICORT 80-4.5 MCG/ACT inhaler INHALE 2 PUFFS INTO THE LUNGS 2 (TWO) TIMES DAILY. 10.2 Inhaler 12  . SYNTHROID 150 MCG tablet     . VOLTAREN 1 % GEL      Facility-Administered Medications Prior to Visit  Medication Dose Route Frequency Provider Last Rate Last Dose  . chlorhexidine (HIBICLENS) 4 % liquid 4 application  60 mL Topical Once Gaynelle Arabian, MD        Review of Systems;  Patient denies headache, fevers, malaise, unintentional weight loss, skin rash, eye pain, sinus congestion and sinus pain, sore throat, dysphagia,  hemoptysis , cough, dyspnea, wheezing, chest pain, palpitations, orthopnea, edema, abdominal pain, nausea, melena, diarrhea, constipation, flank pain, dysuria, hematuria, urinary  Frequency, nocturia, numbness, tingling, seizures,  Focal weakness, Loss of consciousness,  Tremor, insomnia, depression, anxiety, and suicidal ideation.      Objective:  BP 138/66   Pulse 80   Temp 98.9 F (37.2 C)   Resp 18   Wt 223 lb (101.2 kg)   BMI 39.50 kg/m   BP Readings from Last  3 Encounters:  03/29/16 138/66  01/10/16 (!) 156/72  11/15/15 (!) 113/56    Wt Readings from Last 3 Encounters:  03/29/16 223 lb (101.2 kg)  01/10/16 233 lb 8 oz (105.9 kg)  09/06/15 227 lb 8 oz (103.2 kg)    General appearance: alert, cooperative and appears stated age Ears: normal  TM's and external ear canals both ears Throat: lips, mucosa, and tongue normal; teeth and gums normal Neck: no adenopathy, no carotid bruit, supple, symmetrical, trachea midline and thyroid not enlarged, symmetric, no tenderness/mass/nodules Back: symmetric, no curvature. ROM normal. No CVA tenderness. Lungs: clear to auscultation bilaterally Heart: regular rate and rhythm, S1, S2 normal, no murmur, click, rub or gallop Abdomen: soft, non-tender; bowel sounds normal; no masses,  no organomegaly Pulses: 2+ and symmetric Skin: Skin color, texture, turgor normal. No rashes or lesions Lymph nodes: Cervical, supraclavicular, and axillary nodes normal.  Lab Results  Component Value Date   HGBA1C 4.5 (L) 04/04/2013   HGBA1C 5.8 04/18/2011    Lab Results  Component Value Date   CREATININE 1.13 10/05/2015   CREATININE 1.31 (H) 02/24/2015   CREATININE 1.31 (H) 02/24/2015   CREATININE 1.31 (H) 02/24/2015    Lab Results  Component Value Date   WBC 6.5 10/05/2015   HGB 13.6 10/05/2015   HCT 39.9 10/05/2015   PLT 146.0 (L) 10/05/2015   GLUCOSE 109 (H) 10/05/2015   CHOL 151 10/05/2015   TRIG 137.0 10/05/2015   HDL 52.80 10/05/2015   LDLDIRECT 64.7 01/01/2012   LDLCALC 71 10/05/2015   ALT 22 10/05/2015   AST 18 10/05/2015   NA 144 10/05/2015   K 3.8 10/05/2015   CL 109 10/05/2015   CREATININE 1.13 10/05/2015   BUN 22 10/05/2015   CO2 26 10/05/2015   TSH 0.78 11/19/2015   INR 1.03 11/28/2013   HGBA1C 4.5 (L) 04/04/2013   MICROALBUR 1.1 04/04/2013    Ct Lumbar Spine W Contrast  Result Date: 05/11/2015 CLINICAL DATA:  Progressive chronic lumbago/low back pain following prior fusions. EXAM:  LUMBAR MYELOGRAM FLUOROSCOPY TIME:  1 minutes 39 seconds Fifteen fluoroscopic spot films were obtained. PROCEDURE: After thorough discussion of risks and benefits of the procedure including bleeding, infection, injury to nerves, blood vessels, adjacent structures as well as headache and CSF leak, written and oral informed consent was obtained. Consent was obtained by Dr. Dereck Ligas. Time out form was completed. Patient was positioned prone on the fluoroscopy table. Local anesthesia was provided with 1% lidocaine without epinephrine after prepped and draped in the usual sterile fashion. Puncture was performed at L3 using a 3 1/2 inch spinal via midline approach. Using a single pass through the dura, the needle was placed within the thecal sac, with return of clear CSF. 15 mL of Omnipaque-180 was injected into the thecal sac, with normal opacification of the nerve roots and cauda equina consistent with free flow within the subarachnoid space. I personally performed the lumbar puncture and administered the intrathecal contrast. I also personally supervised acquisition of the myelogram images. TECHNIQUE: Contiguous axial images were obtained through the Lumbar spine after the intrathecal infusion of infusion. Coronal and sagittal reconstructions were obtained of the axial image sets. COMPARISON:  None FINDINGS: LUMBAR MYELOGRAM FINDINGS: L2 through L5 posterior lumbar interbody fusion with interconnecting rod at L3. On the standing upright images, there is a mild levoconvex curve of the thoracolumbar spine. There is no recurrent spinal stenosis at the fusion levels. No pathologic motion is identified with flexion and extension maneuvers. There is little if any motion in the lumbar spine with most of the motion occurring at the hips. Grade I retrolisthesis of L1 on L2 measures 2 mm. CT LUMBAR MYELOGRAM FINDINGS: Segmentation: 5 lumbar type vertebral bodies. PLIF from L2 through L5. Alignment:  Mild levoconvex curve  with the apex at L2. Vertebrae: No  aggressive osseous lesions. Vertebral body height is preserved. Conus medullaris: Normal termination at L1. Paraspinal tissues: Dense atherosclerosis. Superior mesenteric artery stent. Bilateral sacroiliac joint degenerative disease. Dysmorphic appearance of the medial RIGHT iliac bone, probably representing a bone graft harvesting site. This appears unchanged from 12/25/2014. Disc levels: T11-T12: Shallow circumferential disc bulge without stenosis. Mild RIGHT facet arthrosis. T12-L1:  Negative. L1-L2: Small RIGHT central disc protrusion barely indenting the thecal sac. Mild bilateral facet arthrosis. The foramina appear adequately patent. Central canal is patent. L2-L3: Laminectomy with decompression. Clumping of nerve roots consistent with arachnoiditis in the posterior RIGHT central canal. This continues inferiorly. No recurrent stenosis. No hardware complication from posterior rod and screw fixation. No findings of pseudoarthrosis. Good incorporation of interbody bone graft with probable tiny amount of bridging bone centrally. L3-L4: Solid fusion. No hardware complication. No recurrent stenosis. Wide posterior decompression. L4-L5: Solid fusion. No complicating features. No hardware complication. Wide posterior decompression. No recurrent stenosis. L5-S1: Solid posterolateral fusion. No stenosis. LEFT laminectomy with decompression of the central canal. IMPRESSION: 1. Technically successful lumbar puncture for lumbar myelogram. 2. L2 through L5 posterior lumbar interbody fusion. Good incorporation of bone graft at L2-L3 with a probable tiny amount of central bridging bone. No findings of pseudoarthrosis. 3. Solid fusion from L3 through S1 without recurrent stenosis. 4. Mild L1-L2 degenerative disc disease with small RIGHT paracentral protrusion but no significant stenosis. 5. Arachnoiditis. Electronically Signed   By: Dereck Ligas M.D.   On: 05/11/2015 13:03   Dg  Myelography Lumbar Inj Lumbosacral  Result Date: 05/11/2015 CLINICAL DATA:  Progressive chronic lumbago/low back pain following prior fusions. EXAM: LUMBAR MYELOGRAM FLUOROSCOPY TIME:  1 minutes 39 seconds Fifteen fluoroscopic spot films were obtained. PROCEDURE: After thorough discussion of risks and benefits of the procedure including bleeding, infection, injury to nerves, blood vessels, adjacent structures as well as headache and CSF leak, written and oral informed consent was obtained. Consent was obtained by Dr. Dereck Ligas. Time out form was completed. Patient was positioned prone on the fluoroscopy table. Local anesthesia was provided with 1% lidocaine without epinephrine after prepped and draped in the usual sterile fashion. Puncture was performed at L3 using a 3 1/2 inch spinal via midline approach. Using a single pass through the dura, the needle was placed within the thecal sac, with return of clear CSF. 15 mL of Omnipaque-180 was injected into the thecal sac, with normal opacification of the nerve roots and cauda equina consistent with free flow within the subarachnoid space. I personally performed the lumbar puncture and administered the intrathecal contrast. I also personally supervised acquisition of the myelogram images. TECHNIQUE: Contiguous axial images were obtained through the Lumbar spine after the intrathecal infusion of infusion. Coronal and sagittal reconstructions were obtained of the axial image sets. COMPARISON:  None FINDINGS: LUMBAR MYELOGRAM FINDINGS: L2 through L5 posterior lumbar interbody fusion with interconnecting rod at L3. On the standing upright images, there is a mild levoconvex curve of the thoracolumbar spine. There is no recurrent spinal stenosis at the fusion levels. No pathologic motion is identified with flexion and extension maneuvers. There is little if any motion in the lumbar spine with most of the motion occurring at the hips. Grade I retrolisthesis of L1 on L2  measures 2 mm. CT LUMBAR MYELOGRAM FINDINGS: Segmentation: 5 lumbar type vertebral bodies. PLIF from L2 through L5. Alignment:  Mild levoconvex curve with the apex at L2. Vertebrae: No aggressive osseous lesions. Vertebral body height is preserved. Conus medullaris: Normal termination  at L1. Paraspinal tissues: Dense atherosclerosis. Superior mesenteric artery stent. Bilateral sacroiliac joint degenerative disease. Dysmorphic appearance of the medial RIGHT iliac bone, probably representing a bone graft harvesting site. This appears unchanged from 12/25/2014. Disc levels: T11-T12: Shallow circumferential disc bulge without stenosis. Mild RIGHT facet arthrosis. T12-L1:  Negative. L1-L2: Small RIGHT central disc protrusion barely indenting the thecal sac. Mild bilateral facet arthrosis. The foramina appear adequately patent. Central canal is patent. L2-L3: Laminectomy with decompression. Clumping of nerve roots consistent with arachnoiditis in the posterior RIGHT central canal. This continues inferiorly. No recurrent stenosis. No hardware complication from posterior rod and screw fixation. No findings of pseudoarthrosis. Good incorporation of interbody bone graft with probable tiny amount of bridging bone centrally. L3-L4: Solid fusion. No hardware complication. No recurrent stenosis. Wide posterior decompression. L4-L5: Solid fusion. No complicating features. No hardware complication. Wide posterior decompression. No recurrent stenosis. L5-S1: Solid posterolateral fusion. No stenosis. LEFT laminectomy with decompression of the central canal. IMPRESSION: 1. Technically successful lumbar puncture for lumbar myelogram. 2. L2 through L5 posterior lumbar interbody fusion. Good incorporation of bone graft at L2-L3 with a probable tiny amount of central bridging bone. No findings of pseudoarthrosis. 3. Solid fusion from L3 through S1 without recurrent stenosis. 4. Mild L1-L2 degenerative disc disease with small RIGHT  paracentral protrusion but no significant stenosis. 5. Arachnoiditis. Electronically Signed   By: Dereck Ligas M.D.   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  Advised to use Beano with meals, and Mylanta Gas prn. If no improvement,  Prn NTG       Other Visit Diagnoses   None.    A total of 25 minutes of face to face time was spent with patient more than half of which was spent in counselling about the above mentioned conditions  and coordination of care   I have discontinued Ms. Shenefield's nystatin, phentermine, nystatin, ciprofloxacin, VOLTAREN, fosfomycin, SYMBICORT, and SE-TAN PLUS. I am also having her start on nitroGLYCERIN. Additionally, I am having her maintain her sodium chloride, NEEDLE (DISP) 23 G, vitamin C, OVER THE COUNTER MEDICATION, amLODipine, Prenatal Vit-Fe Fumarate-FA (PRENATAL 1 PLUS 1 PO), pyridOXINE, Coenzyme Q10, polyethylene glycol powder, isosorbide mononitrate, CLARINEX, spironolactone, COZAAR, LEXAPRO, LOVAZA, cyanocobalamin, LIDODERM, fluticasone, PREVIDENT 5000 DRY MOUTH, tobramycin, doxepin, oxyCODONE-acetaminophen, rosuvastatin, montelukast, NORVASC, VALIUM, AMBIEN, Vitamin D (Ergocalciferol), benzonatate, escitalopram, SYRINGE-NEEDLE (DISP) 3 ML, and SYNTHROID.  Meds ordered this encounter  Medications  . nitroGLYCERIN (NITROSTAT) 0.4 MG SL tablet    Sig: Place 1 tablet (0.4 mg total) under the tongue every 5 (five) minutes as needed for chest pain.    Dispense:  50 tablet    Refill:  3    Medications Discontinued During This Encounter  Medication Reason  . ciprofloxacin (CIPRO) 750 MG tablet Completed Course  . doxepin (SINEQUAN) 25 MG capsule Duplicate  . VOLTAREN 1 % GEL Ineffective  . SYNTHROID [id number] MCG tablet Duplicate  . SYMBICORT 80-4.5 MCG/ACT inhaler Completed Course  . fosfomycin (MONUROL) 3 g PACK Completed  Course  . nystatin (MYCOSTATIN) 100000 UNIT/ML suspension Change in therapy  . nystatin (MYCOSTATIN) powder Completed Course  . oxyCODONE-acetaminophen (PERCOCET) 123XX123 MG tablet Duplicate  . phentermine (ADIPEX-P) 37.5 MG tablet Completed Course  . FeFum-FePo-FA-B Cmp-C-Zn-Mn-Cu (SE-TAN PLUS) 0000000 MG CAPS Duplicate    Follow-up: No Follow-up on file.   Vonda Harth,  Aris Everts, MD

## 2016-03-29 NOTE — Progress Notes (Signed)
Pre visit review using our clinic review tool, if applicable. No additional management support is needed unless otherwise documented below in the visit note. 

## 2016-03-31 DIAGNOSIS — R079 Chest pain, unspecified: Secondary | ICD-10-CM | POA: Insufficient documentation

## 2016-03-31 NOTE — Assessment & Plan Note (Signed)
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  Advised to use Beano with meals, and Mylanta Gas prn. If no improvement,  Prn NTG

## 2016-04-03 ENCOUNTER — Encounter: Payer: Federal, State, Local not specified - PPO | Admitting: Physical Therapy

## 2016-04-03 ENCOUNTER — Other Ambulatory Visit: Payer: Self-pay | Admitting: Internal Medicine

## 2016-04-03 NOTE — Telephone Encounter (Signed)
Pt was sent in rx on 03/08/2016 with instructions to be filled on or after 03/25/2016. I called pharmacy to see when pt last picked up rx. Pt picked rx up 03/14/2016. Pt is now requesting another rx to be sent in. Pt did not note cough on latest OV on 03/29/2016.

## 2016-04-05 ENCOUNTER — Encounter: Payer: Federal, State, Local not specified - PPO | Admitting: Physical Therapy

## 2016-04-08 ENCOUNTER — Emergency Department
Admission: EM | Admit: 2016-04-08 | Discharge: 2016-04-08 | Disposition: A | Discharge status: Home / Self Care | Payer: Federal, State, Local not specified - PPO | Attending: Emergency Medicine | Admitting: Emergency Medicine

## 2016-04-08 DIAGNOSIS — Z87891 Personal history of nicotine dependence: Secondary | ICD-10-CM | POA: Insufficient documentation

## 2016-04-08 DIAGNOSIS — Z79899 Other long term (current) drug therapy: Secondary | ICD-10-CM | POA: Diagnosis not present

## 2016-04-08 DIAGNOSIS — R040 Epistaxis: Secondary | ICD-10-CM | POA: Diagnosis present

## 2016-04-08 DIAGNOSIS — E039 Hypothyroidism, unspecified: Secondary | ICD-10-CM | POA: Insufficient documentation

## 2016-04-08 DIAGNOSIS — I509 Heart failure, unspecified: Secondary | ICD-10-CM | POA: Insufficient documentation

## 2016-04-08 DIAGNOSIS — J45909 Unspecified asthma, uncomplicated: Secondary | ICD-10-CM | POA: Insufficient documentation

## 2016-04-08 DIAGNOSIS — N183 Chronic kidney disease, stage 3 (moderate): Secondary | ICD-10-CM | POA: Diagnosis not present

## 2016-04-08 DIAGNOSIS — J449 Chronic obstructive pulmonary disease, unspecified: Secondary | ICD-10-CM | POA: Insufficient documentation

## 2016-04-08 DIAGNOSIS — I2510 Atherosclerotic heart disease of native coronary artery without angina pectoris: Secondary | ICD-10-CM | POA: Diagnosis not present

## 2016-04-08 DIAGNOSIS — I130 Hypertensive heart and chronic kidney disease with heart failure and stage 1 through stage 4 chronic kidney disease, or unspecified chronic kidney disease: Secondary | ICD-10-CM | POA: Diagnosis not present

## 2016-04-08 LAB — BASIC METABOLIC PANEL
Anion gap: 8 (ref 5–15)
BUN: 25 mg/dL — ABNORMAL HIGH (ref 6–20)
CALCIUM: 9.8 mg/dL (ref 8.9–10.3)
CO2: 23 mmol/L (ref 22–32)
CREATININE: 1.54 mg/dL — AB (ref 0.44–1.00)
Chloride: 108 mmol/L (ref 101–111)
GFR calc Af Amer: 37 mL/min — ABNORMAL LOW (ref >60)
GFR calc non Af Amer: 32 mL/min — ABNORMAL LOW (ref >60)
GLUCOSE: 98 mg/dL (ref 65–99)
Potassium: 4 mmol/L (ref 3.5–5.1)
Sodium: 139 mmol/L (ref 135–145)

## 2016-04-08 LAB — CBC
HEMATOCRIT: 39.9 % (ref 35.0–47.0)
Hemoglobin: 13.8 g/dL (ref 12.0–16.0)
MCH: 33.2 pg (ref 26.0–34.0)
MCHC: 34.7 g/dL (ref 32.0–36.0)
MCV: 95.7 fL (ref 80.0–100.0)
Platelets: 156 10*3/uL (ref 150–440)
RBC: 4.17 MIL/uL (ref 3.80–5.20)
RDW: 12.1 % (ref 11.5–14.5)
WBC: 8.7 10*3/uL (ref 3.6–11.0)

## 2016-04-08 MED ORDER — OXYMETAZOLINE HCL 0.05 % NA SOLN
NASAL | Status: AC
  Filled 2016-04-08: qty 15

## 2016-04-08 MED ORDER — LIDOCAINE VISCOUS 2 % MT SOLN
OROMUCOSAL | Status: AC
  Filled 2016-04-08: qty 15

## 2016-04-08 MED ORDER — LIDOCAINE HCL 2 % EX GEL
CUTANEOUS | Status: AC
  Filled 2016-04-08: qty 10

## 2016-04-08 MED ORDER — CLINDAMYCIN HCL 300 MG PO CAPS: 300.0000 mg | ORAL_CAPSULE | Freq: Three times a day (TID) | ORAL | 0 refills | Status: AC

## 2016-04-08 NOTE — ED Notes (Signed)
Report to David, RN.

## 2016-04-08 NOTE — ED Notes (Signed)
Dr. Dahlia Client at bedside with intervention in efforts to have nose bleed to stop. Pt tolerated well.

## 2016-04-08 NOTE — ED Notes (Signed)
Pt nose clamped at this time.

## 2016-04-08 NOTE — ED Triage Notes (Signed)
Pt arrives to ER via [facility] from home c/o epistaxis that began at 12:10AM today after "irrigating my sinus'". Pt unable to get bleeding to stop. Pt takes 81mg  ASA daily, denies blood thinners. Pt alert and oriented X4, active, cooperative, pt in NAD. RR even and unlabored, color WNL.

## 2016-04-08 NOTE — ED Provider Notes (Signed)
[facility] Emergency Department Provider Note   ____________________________________________   First MD Initiated Contact with Patient 04/08/16 0216     (approximate)  I have reviewed the triage vital signs and the nursing notes.   HISTORY  Chief Complaint Epistaxis    HPI [name] is a 75 y.o. female who comes into the hospital today with a nosebleed. The patient reports that she was irrigating her nose because it was cauterized week ago. She reports that she was able to irrigate it yesterday without any problem and did some gentle irrigation tonight and her nose started bleeding profusely. The bleeding started around 12:10 AM. She reports that she's had bleeding before but has never been this bad. It's been pouring out blood. The patient takes aspirin 81 mg. She feels blood going down the back of her throat. She had her cautery done at [facility] by ENT. The patient reports that she is starting to feel a little bit lightheaded. The patient is here for evaluation and treatment of her nosebleed.   Past Medical History:  Diagnosis Date  . Asthma 06/2010   depending on what around  . Blood transfusion 2009   back surgery  . Brain aneurysm    S/P RESECTION  . CHF (congestive heart failure) ([place]) 05/2010   after receiving Propofol  . Chronic sinusitis   . Complication of anesthesia 05/30/2010   from colonoscopy-had Propofol-had a  . Complication of anesthesia    reaction-went into asthma-pneumonia,-  . Complication of anesthesia    then CHF and to ICU  . Degenerative disc disease    HERNITAED DISC BY MRI ON 07/07;S/P LAMINECTOMY,RID REPLACEMENT  . Discoid lupus erythematosus eyelid    NEGATIVE SEROLOGIC MARKERS  . Dysrhythmia    PVC's occ.  Marland Kitchen Headache(784.0)    migraines occ.  Marland Kitchen Hypercholesterolemia   . Hypertension   . Hypothyroidism    takes Synthroid  . Pneumonia 05/30/2010   after receiving Propofol  . Renal insufficiency    Patient states  she has been told she has Stage I kidney disease  . Sleep apnea    uses CPAP-8.4-oxygen suppliment  . Systemic lupus erythematosus ([place]) 05/15/2012    Patient Active Problem List   Diagnosis Date Noted  . Chest pain 03/31/2016  . Chronic narcotic dependence ([place]) 01/11/2016  . Pain in joint, ankle and foot 09/07/2015  . Candidiasis of skin 09/06/2015  . UTI (urinary tract infection) 09/06/2015  . Status post lumbar spinal fusion 06/01/2015  . Major depressive disorder with single episode, in remission ([place]) 06/01/2015  . Chronic rhinitis 05/24/2015  . CAP (community acquired pneumonia) 02/28/2015  . COPD exacerbation ([place]) 02/28/2015  . Cystitis 02/02/2015  . 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 CKD (chronic kidney disease) stage 3, GFR 30-59 ml/min 01/07/2015  . Abdominal pain of unknown cause 01/05/2015  . Coronary atherosclerosis 01/05/2015  . Aortic atherosclerosis ([place]) 01/05/2015  . Screening for osteoporosis 08/22/2014  . Stress incontinence 04/25/2014  . 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 Murmur 12/31/2013  . Bradycardia 12/31/2013  . Nocturnal hypoxia 10/09/2013  . Shortness of breath 09/23/2013  . History of bacterial pneumonia 09/01/2013  . Infection by Legionella pneumophila ([place]) 09/01/2013  . History of tobacco abuse 09/01/2013  . Other and unspecified hyperlipidemia 03/28/2013  . Preoperative clearance 03/28/2013  . Encounter for other preprocedural examination 03/28/2013  . Routine general medical examination at a health care facility 03/27/2013  .  OSA on CPAP 03/27/2013  . Cough 03/13/2013  . Insomnia secondary to chronic pain 03/13/2013  . Allergic rhinitis 03/13/2013  . 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 Congestive heart failure ([place]) 05/15/2012  . Clinical depression 05/15/2012  . Hearing loss of left ear 05/15/2012  . Adult hypothyroidism 05/15/2012  . Systemic lupus  erythematosus ([place]) 05/15/2012  . Obesity (BMI 30-39.9) 03/10/2012  . Monoclonal gammopathy of undetermined significance 03/10/2012  . Anemia 01/01/2012  . Hypothyroidism 01/01/2012  . Osteoarthritis of right knee 07/12/2011  . Brain aneurysm 04/18/2011  . Chronic back pain greater than 3 months duration 04/18/2011  . Breast screening, unspecified 04/18/2011  . Encounter for other screening for malignant neoplasm of breast 04/18/2011  . Hypertension 04/07/2011    Past Surgical History:  Procedure Laterality Date  . ABDOMINAL HYSTERECTOMY  1988   uterus only  . BACK SURGERY  1968,1988,2002,2009   4 laminectomies and fusions  . BRAIN SURGERY  2005   coiling of cranial aneurysum  . COLONOSCOPY  2008  . COLONOSCOPY WITH PROPOFOL N/A 01/13/2015   Procedure: COLONOSCOPY WITH PROPOFOL;  Surgeon: Christene Lye, MD;  Location: [facility] ENDOSCOPY;  Service: Endoscopy;  Laterality: N/A;  . COLONOSCOPY WITH PROPOFOL N/A 03/24/2015   Procedure: COLONOSCOPY WITH PROPOFOL;  Surgeon: Christene Lye, MD;  Location: [facility] ENDOSCOPY;  Service: Endoscopy;  Laterality: N/A;  . COSMETIC SURGERY    . [place]   left foot  . EYE SURGERY     both cataracts -[place] 7/14  . FOOT ARTHRODESIS Right 05/22/2013   Procedure: RIGHT HALLUX METATARSAL PHALANGEAL JOINT ARTHRODESIS ;  Surgeon: Wylene Simmer, MD;  Location: [place];  Service: Orthopedics;  Laterality: Right;  . JOINT REPLACEMENT  2013   Right knee,  [place]  . lipomas  1975   3 from right breast  . NASAL SINUS SURGERY  1977-2008  . PERIPHERAL VASCULAR CATHETERIZATION N/A 01/25/2015   Procedure: Visceral Angiography;  Surgeon: Algernon Huxley, MD;  Location: [place] CV LAB;  Service: Cardiovascular;  Laterality: N/A;  . RIGHT HEART CATHETERIZATION N/A 11/28/2013   Procedure: RIGHT HEART CATH;  Surgeon: Jolaine Artist, MD;  Location[facility] CATH LAB;  Service: Cardiovascular;  Laterality: N/A;  .  TONSILLECTOMY  1963  . TOTAL KNEE ARTHROPLASTY  07/10/2011   Procedure: TOTAL KNEE ARTHROPLASTY;  Surgeon: Gearlean Alf;  Location: [facility] ORS;  Service: Orthopedics;  Laterality: Right;  . TYMPANIC MEMBRANE REPAIR  2009-last one   x5 times    Prior to Admission medications   Medication Sig Start Date End Date Taking? Authorizing Provider  AMBIEN 5 MG tablet TAKE 1 TABLET BY MOUTH AT BEDTIME AS NEEDED FOR SLEEP 02/25/16   Crecencio Mc, MD  amLODipine (NORVASC) 10 MG tablet Take 10 mg by mouth daily. Reported on 01/10/2016    Historical Provider, MD  Ascorbic Acid (VITAMIN C) 1000 MG tablet Take 500 mg by mouth daily.     Historical Provider, MD  benzonatate (TESSALON) 200 MG capsule TAKE 1 CAPSULE (200 MG TOTAL) BY MOUTH 3 (THREE) TIMES DAILY AS NEEDED FOR COUGH. 03/08/16   Crecencio Mc, MD  CLARINEX 5 MG tablet TAKE 1 TABLET (5 MG TOTAL) BY MOUTH EVERY MORNING. 05/04/15   Crecencio Mc, MD  clindamycin (CLEOCIN) 300 MG capsule Take 1 capsule (300 mg total) by mouth 3 (three) times daily. 04/08/16 04/13/16  Loney Hering, MD  Coenzyme Q10 100 MG TABS Take 1  tablet by mouth daily.    Historical Provider, MD  COZAAR 50 MG tablet TAKE 1 TABLET BY MOUTH TWICE A DAY 05/04/15   Crecencio Mc, MD  cyanocobalamin (,VITAMIN B-12,) 1000 MCG/ML injection FOR USE WITH MONTHLY B12 INJECTIONS 10/25/15   Crecencio Mc, MD  doxepin (SINEQUAN) 25 MG capsule TAKE 2 CAPSULES (50 MG TOTAL) BY MOUTH AT BEDTIME. 12/13/15   Crecencio Mc, MD  escitalopram (LEXAPRO) 10 MG tablet Take 1 tablet (10 mg total) by mouth daily. 03/14/16   Crecencio Mc, MD  fluticasone Asencion Islam) 50 MCG/ACT nasal spray  11/13/15   Historical Provider, MD  isosorbide mononitrate (IMDUR) 60 MG 24 hr tablet TAKE 1 TABLET (60 MG TOTAL) BY MOUTH DAILY. 05/04/15   Crecencio Mc, MD  LEXAPRO 10 MG tablet TAKE 1 TABLET (10 MG TOTAL) BY MOUTH DAILY. 05/04/15   Crecencio Mc, MD  LIDODERM 5 % PLACE 3 PATCHES ONTO SKIN DAILY AND DISCARD WITH 12 HOURS  OR AS DIRECTED 11/03/15   Crecencio Mc, MD  LOVAZA 1 G capsule TAKE 2 CAPSULES (2 G TOTAL) BY MOUTH 2 (TWO) TIMES DAILY. 05/04/15   Crecencio Mc, MD  montelukast (SINGULAIR) 10 MG tablet Take 1 tablet (10 mg total) by mouth at bedtime. 01/10/16   Crecencio Mc, MD  NEEDLE, DISP, 23 G (BD DISP NEEDLE) 23G X 1" MISC Uses to inject b12 into muscle once monthly 07/24/13   Crecencio Mc, MD  nitroGLYCERIN (NITROSTAT) 0.4 MG SL tablet Place 1 tablet (0.4 mg total) under the tongue every 5 (five) minutes as needed for chest pain. 03/29/16   Crecencio Mc, MD  NORVASC 10 MG tablet TAKE 1 TABLET (10 MG TOTAL) BY MOUTH DAILY. 02/03/16   Crecencio Mc, MD  OVER THE COUNTER MEDICATION Take 1 capsule by mouth daily. Florify    Historical Provider, MD  oxyCODONE-acetaminophen (PERCOCET) 10-325 MG tablet Take 1 tablet by mouth every 8 (eight) hours as needed for pain. May refill on or after March 25 2016 01/10/16   Crecencio Mc, MD  polyethylene glycol powder (GLYCOLAX/MIRALAX) powder 255 grams one bottle for colonoscopy prep 02/24/15   Seeplaputhur Robinette Haines, MD  Prenatal Vit-Fe Fumarate-FA (PRENATAL 1 PLUS 1 PO) Take 1 tablet by mouth daily.    Historical Provider, MD  PREVIDENT 5000 DRY MOUTH 1.1 % GEL dental gel  09/28/15   Historical Provider, MD  pyridOXINE (VITAMIN B-6) 100 MG tablet Take 100 mg by mouth daily.    Historical Provider, MD  rosuvastatin (CRESTOR) 20 MG tablet TAKE 1 TABLET (20 MG TOTAL) BY MOUTH DAILY. 01/10/16   Crecencio Mc, MD  sodium chloride (OCEAN) 0.65 % nasal spray Place 2 sprays into the nose as needed. Allergies      Historical Provider, MD  spironolactone (ALDACTONE) 25 MG tablet TAKE 1 TABLET EVERY DAY 05/04/15   Crecencio Mc, MD  SYNTHROID 137 MCG tablet TAKE 1 TABLET (137 MCG TOTAL) BY MOUTH DAILY BEFORE BREAKFAST. 03/27/16   Crecencio Mc, MD  SYRINGE-NEEDLE, DISP, 3 ML 25G X 1" 3 ML MISC Please dispense so patient can administer B12 injections monthly 03/17/16   Crecencio Mc,  MD  tobramycin (NEBCIN) 80 MG/2ML SOLN injection  10/05/15   Historical Provider, MD  VALIUM 5 MG tablet TAKE 1 TABLET TWICE A DAY 02/15/16   Crecencio Mc, MD  Vitamin D, Ergocalciferol, (DRISDOL) 50000 units CAPS capsule TAKE 1 CAPSULE (50,000 UNITS  TOTAL) BY MOUTH EVERY 7 (SEVEN) DAYS. 03/08/16   Crecencio Mc, MD    Allergies Penicillins; Penicillins; Propofol; Propranolol; Triamcinolone acetonide; Adhesive [tape]; Kenalog [triamcinolone acetonide]; Septra [bactrim]; and Sulfur  Family History  Problem Relation Age of Onset  . Heart disease Mother   . Stroke Mother   . Alcohol abuse Other   . Cancer Sister     leukemia  . Cancer Brother     lung Ca, asbestos    Social History Social History  Substance Use Topics  . Smoking status: Never Smoker  . Smokeless tobacco: Never Used     Comment: REMOTELY QUIT  TOBACCO USE  . Alcohol use Yes     Comment: occasional    Review of Systems Constitutional: No fever/chills Eyes: No visual changes. ENT: Nose bleed. Cardiovascular: Denies chest pain. Respiratory: Denies shortness of breath. Gastrointestinal: No abdominal pain.  No nausea, no vomiting.  No diarrhea.  No constipation. Genitourinary: Negative for dysuria. Musculoskeletal: Negative for back pain. Skin: Negative for rash. Neurological: Lightheadedness.  10-point ROS otherwise negative.  ____________________________________________   PHYSICAL EXAM:  VITAL SIGNS: ED Triage Vitals  Enc Vitals Group     BP 04/08/16 0159 (!) 151/67     Pulse Rate 04/08/16 0159 71     Resp 04/08/16 0159 18     Temp 04/08/16 0159 98.2 F (36.8 C)     Temp Source 04/08/16 0159 Oral     SpO2 04/08/16 0159 96 %     Weight 04/08/16 0201 200 lb (90.7 kg)     Height 04/08/16 0201 5\' 3"  (1.6 m)     Head Circumference --      Peak Flow --      Pain Score --      Pain Loc --      Pain Edu? --      Excl. in [place]? --     Constitutional: Alert and oriented. Well appearing and in  Moderate distress. Eyes: Conjunctivae are normal. PERRL. EOMI. Head: Atraumatic. Nose: Bleeding from both nationals left greater than right Mouth/Throat: Mucous membranes are moist.  Oropharynx non-erythematous. Clot in the left oropharynx Cardiovascular: Normal rate, regular rhythm. Grossly normal heart sounds.  Good peripheral circulation. Respiratory: Normal respiratory effort.  No retractions. Lungs CTAB. Gastrointestinal: Soft and nontender. No distention. Positive bowel sounds Musculoskeletal: No lower extremity tenderness nor edema.  Neurologic:  Normal speech and language.  Skin:  Skin is warm, dry and intact. Marland Kitchen Psychiatric: Mood and affect are normal.   ____________________________________________   LABS (all labs ordered are listed, but only abnormal results are displayed)  Labs Reviewed  BASIC METABOLIC PANEL - Abnormal; Notable for the following:       Result Value   BUN 25 (*)    Creatinine, Ser 1.54 (*)    GFR calc non Af Amer 32 (*)    GFR calc Af Amer 37 (*)    All other components within normal limits  CBC   ____________________________________________  EKG  None ____________________________________________  RADIOLOGY  None ____________________________________________   PROCEDURES  Procedure(s) performed: None  Procedures  Critical Care performed: No  ____________________________________________   INITIAL IMPRESSION / ASSESSMENT AND PLAN / ED COURSE  Pertinent labs & imaging results that were available during my care of the patient were reviewed by me and considered in my medical decision making (see chart for details).  This is a 75 year old female who comes into the hospital today with a nosebleed. The patient is unable to  get the nosebleed to stop. I did pack the patient's nostrils with Murocel packing. I initially packed the left and then when the patient started bleeding on the right eye placed packing in the right. The patient did  continue to have some mild tripping so I placed a clamp to help facilitate clotting. I then called Dr. Tami Ribas who suggested placing ice pack on the patient's nose. I will check some blood work and reassess the patient.  Clinical Course   After using the ice pack the patient reports that the bleeding did seem to stop. She was able to move around without any return of epistaxis. The patient reports that this time she is ready to go home. The patient's blood pressure did get down to 130s over 50s but did go back up to 150s over 60s. Since the patient's bleeding is improved and her blood work is unremarkable I will discharge her home. Her vital signs are also stable at this time.  ____________________________________________   FINAL CLINICAL IMPRESSION(S) / ED DIAGNOSES  Final diagnoses:  Epistaxis      NEW MEDICATIONS STARTED DURING THIS VISIT:  Discharge Medication List as of 04/08/2016  5:28 AM    START taking these medications   Details  clindamycin (CLEOCIN) 300 MG capsule Take 1 capsule (300 mg total) by mouth 3 (three) times daily., Starting Sat 04/08/2016, Until Thu 04/13/2016, Print         Note:  This document was prepared using Dragon voice recognition software and may include unintentional dictation errors.    Loney Hering, MD 04/08/16 [phone]

## 2016-04-08 NOTE — ED Notes (Signed)
MD at bedside. 

## 2016-04-10 DIAGNOSIS — R040 Epistaxis: Secondary | ICD-10-CM | POA: Insufficient documentation

## 2016-04-18 ENCOUNTER — Other Ambulatory Visit: Payer: Self-pay | Admitting: Internal Medicine

## 2016-04-19 NOTE — Telephone Encounter (Signed)
REFILLED

## 2016-04-19 NOTE — Telephone Encounter (Signed)
Last fill 03/11/16.

## 2016-04-27 ENCOUNTER — Other Ambulatory Visit: Payer: Self-pay | Admitting: Internal Medicine

## 2016-04-27 DIAGNOSIS — N3090 Cystitis, unspecified without hematuria: Secondary | ICD-10-CM

## 2016-04-28 ENCOUNTER — Other Ambulatory Visit: Payer: Self-pay | Admitting: Internal Medicine

## 2016-04-28 NOTE — Telephone Encounter (Signed)
Pt called about wanting to get a refill for oxyCODONE-acetaminophen (PERCOCET) 10-325 MG tablet.  Call pt when it's ready @ [phone]. Thank you!

## 2016-04-28 NOTE — Telephone Encounter (Signed)
Refill request for Percocet, last seen [id number], last filled [id number].  Please advise.

## 2016-04-30 ENCOUNTER — Other Ambulatory Visit: Payer: Self-pay | Admitting: Internal Medicine

## 2016-04-30 MED ORDER — OXYCODONE-ACETAMINOPHEN 10-325 MG PO TABS: 1.0000 | ORAL_TABLET | Freq: Two times a day (BID) | ORAL | 0 refills | Status: DC | PRN

## 2016-04-30 NOTE — Telephone Encounter (Signed)
As discussed at her visit in May .  I will begin weaning her from Percocet given normal CT and myelogram .  Qty reduced to #60 this time

## 2016-05-01 ENCOUNTER — Telehealth: Payer: Self-pay | Admitting: *Deleted

## 2016-05-01 NOTE — Telephone Encounter (Signed)
Rx sent to pharmacy   

## 2016-05-01 NOTE — Telephone Encounter (Signed)
rx ready for pick up... Attempted to call patient but unable to get response phone kept ringing

## 2016-05-01 NOTE — Telephone Encounter (Signed)
Patient  Requested an update on med refill Pt contact [phone]

## 2016-05-04 ENCOUNTER — Telehealth: Payer: Self-pay | Admitting: Internal Medicine

## 2016-05-04 NOTE — Telephone Encounter (Signed)
Have we received a PA from the pharmacy?

## 2016-05-04 NOTE — Telephone Encounter (Signed)
Pt's husband was in, pt needs authorization on her AMBIEN 5 MG tablet, a year is up and the insurance was authorization to refill. Please call pt at [phone].

## 2016-05-05 NOTE — Telephone Encounter (Signed)
Medication was picked up by husband Mr. Gladstone

## 2016-05-10 NOTE — Telephone Encounter (Signed)
I called the patient to inform her that her medication was ready for pick up .

## 2016-05-10 NOTE — Telephone Encounter (Signed)
Called the pharmacy to clarify what was needed, they stated that the patient's plan rejected the Ambien due to exceeding the plan amount.  Called the number provided [phone], spoke with Verdis Frederickson regarding the issue, approved from March 11, 2016 to Sept 20th 2018.

## 2016-05-12 ENCOUNTER — Other Ambulatory Visit: Payer: Self-pay

## 2016-05-12 ENCOUNTER — Other Ambulatory Visit: Payer: Self-pay | Admitting: *Deleted

## 2016-05-12 MED ORDER — AMLODIPINE BESYLATE 10 MG PO TABS: ORAL_TABLET | ORAL | 1 refills | Status: DC

## 2016-05-12 MED ORDER — ESCITALOPRAM OXALATE 10 MG PO TABS: 10.0000 mg | ORAL_TABLET | Freq: Every day | ORAL | 10 refills | Status: DC

## 2016-05-12 NOTE — Telephone Encounter (Signed)
Medication has been refilled.

## 2016-05-18 ENCOUNTER — Encounter: Payer: Federal, State, Local not specified - PPO | Admitting: Internal Medicine

## 2016-05-19 ENCOUNTER — Other Ambulatory Visit: Payer: Self-pay | Admitting: Internal Medicine

## 2016-05-23 ENCOUNTER — Other Ambulatory Visit: Payer: Self-pay | Admitting: Internal Medicine

## 2016-05-23 MED ORDER — ESCITALOPRAM OXALATE 10 MG PO TABS: ORAL_TABLET | ORAL | 2 refills | Status: DC

## 2016-05-24 ENCOUNTER — Other Ambulatory Visit: Payer: Self-pay

## 2016-05-24 ENCOUNTER — Telehealth: Payer: Self-pay | Admitting: Internal Medicine

## 2016-05-24 MED ORDER — OXYCODONE-ACETAMINOPHEN 10-325 MG PO TABS: 1.0000 | ORAL_TABLET | Freq: Two times a day (BID) | ORAL | 0 refills | Status: DC | PRN

## 2016-05-24 NOTE — Telephone Encounter (Signed)
Pt called and asked if she can have 3 or 4 oxyCODONE-acetaminophen (PERCOCET) 10-325 MG tablet to hold her over till appt on 10/23. Appt was reschedule from 9/28. Thank you!  Call pt [phone]

## 2016-05-24 NOTE — Telephone Encounter (Signed)
Last fill 04/30/16 last OV 03/29/16

## 2016-05-24 NOTE — Telephone Encounter (Signed)
FULL REFILL OF #60 GIVEN

## 2016-05-25 NOTE — Telephone Encounter (Signed)
Patient notified and voiced understanding script placed at front desk.

## 2016-05-29 ENCOUNTER — Other Ambulatory Visit: Payer: Self-pay | Admitting: Internal Medicine

## 2016-05-29 DIAGNOSIS — H7012 Chronic mastoiditis, left ear: Secondary | ICD-10-CM | POA: Insufficient documentation

## 2016-06-01 ENCOUNTER — Other Ambulatory Visit: Payer: Self-pay | Admitting: Internal Medicine

## 2016-06-01 NOTE — Telephone Encounter (Signed)
Last filled 04/04/16. Last OV 03/29/16. Physical scheduled 06/12/16.

## 2016-06-02 NOTE — Telephone Encounter (Signed)
faxed

## 2016-06-08 ENCOUNTER — Other Ambulatory Visit: Payer: Self-pay | Admitting: Internal Medicine

## 2016-06-09 ENCOUNTER — Other Ambulatory Visit (INDEPENDENT_AMBULATORY_CARE_PROVIDER_SITE_OTHER): Payer: Federal, State, Local not specified - PPO

## 2016-06-09 ENCOUNTER — Telehealth: Payer: Self-pay | Admitting: Internal Medicine

## 2016-06-09 DIAGNOSIS — R300 Dysuria: Secondary | ICD-10-CM | POA: Diagnosis not present

## 2016-06-09 LAB — POCT URINALYSIS DIPSTICK
BILIRUBIN UA: NEGATIVE
Glucose, UA: NEGATIVE
KETONES UA: NEGATIVE
Nitrite, UA: NEGATIVE
PH UA: 6
PROTEIN UA: 100
RBC UA: NEGATIVE
Urobilinogen, UA: 0.2

## 2016-06-09 NOTE — Telephone Encounter (Signed)
Please schedule lab and call patient labs in.

## 2016-06-09 NOTE — Telephone Encounter (Signed)
Ok. Thank you Pt is scheduled.  °

## 2016-06-09 NOTE — Telephone Encounter (Signed)
Pt called about possibly having a UTI. Pt just wants to come in and give a urine sample without being seen. Please advise?  Call pt @ [phone]. Thank you!

## 2016-06-10 LAB — URINALYSIS, ROUTINE W REFLEX MICROSCOPIC
Bilirubin Urine: NEGATIVE
GLUCOSE, UA: NEGATIVE
HGB URINE DIPSTICK: NEGATIVE
Ketones, ur: NEGATIVE
NITRITE: NEGATIVE
PH: 6 (ref 5.0–8.0)
SPECIFIC GRAVITY, URINE: 1.006 (ref 1.001–1.035)

## 2016-06-10 LAB — URINALYSIS, MICROSCOPIC ONLY
CASTS: NONE SEEN [LPF]
CRYSTALS: NONE SEEN [HPF]
RBC / HPF: NONE SEEN RBC/HPF (ref <=2)
SQUAMOUS EPITHELIAL / LPF: NONE SEEN [HPF] (ref <=5)
Yeast: NONE SEEN [HPF]

## 2016-06-12 ENCOUNTER — Ambulatory Visit (INDEPENDENT_AMBULATORY_CARE_PROVIDER_SITE_OTHER): Payer: Federal, State, Local not specified - PPO | Admitting: Internal Medicine

## 2016-06-12 ENCOUNTER — Telehealth: Payer: Self-pay

## 2016-06-12 VITALS — BP 124/64 | HR 66 | Temp 98.2°F | Resp 12 | Ht 63.0 in | Wt 213.8 lb

## 2016-06-12 DIAGNOSIS — N3000 Acute cystitis without hematuria: Secondary | ICD-10-CM

## 2016-06-12 DIAGNOSIS — N183 Chronic kidney disease, stage 3 unspecified: Secondary | ICD-10-CM

## 2016-06-12 DIAGNOSIS — I700 Atherosclerosis of aorta: Secondary | ICD-10-CM

## 2016-06-12 DIAGNOSIS — R7301 Impaired fasting glucose: Secondary | ICD-10-CM | POA: Diagnosis not present

## 2016-06-12 DIAGNOSIS — Z1239 Encounter for other screening for malignant neoplasm of breast: Secondary | ICD-10-CM

## 2016-06-12 DIAGNOSIS — E559 Vitamin D deficiency, unspecified: Secondary | ICD-10-CM | POA: Diagnosis not present

## 2016-06-12 DIAGNOSIS — Z0000 Encounter for general adult medical examination without abnormal findings: Secondary | ICD-10-CM

## 2016-06-12 DIAGNOSIS — D638 Anemia in other chronic diseases classified elsewhere: Secondary | ICD-10-CM

## 2016-06-12 DIAGNOSIS — E039 Hypothyroidism, unspecified: Secondary | ICD-10-CM

## 2016-06-12 DIAGNOSIS — E782 Mixed hyperlipidemia: Secondary | ICD-10-CM | POA: Diagnosis not present

## 2016-06-12 DIAGNOSIS — Z981 Arthrodesis status: Secondary | ICD-10-CM

## 2016-06-12 DIAGNOSIS — Z01818 Encounter for other preprocedural examination: Secondary | ICD-10-CM

## 2016-06-12 DIAGNOSIS — F325 Major depressive disorder, single episode, in full remission: Secondary | ICD-10-CM

## 2016-06-12 DIAGNOSIS — E669 Obesity, unspecified: Secondary | ICD-10-CM

## 2016-06-12 DIAGNOSIS — I10 Essential (primary) hypertension: Secondary | ICD-10-CM

## 2016-06-12 DIAGNOSIS — I2510 Atherosclerotic heart disease of native coronary artery without angina pectoris: Secondary | ICD-10-CM

## 2016-06-12 DIAGNOSIS — Z780 Asymptomatic menopausal state: Secondary | ICD-10-CM

## 2016-06-12 DIAGNOSIS — Z1231 Encounter for screening mammogram for malignant neoplasm of breast: Secondary | ICD-10-CM | POA: Diagnosis not present

## 2016-06-12 DIAGNOSIS — G8929 Other chronic pain: Secondary | ICD-10-CM

## 2016-06-12 DIAGNOSIS — G4701 Insomnia due to medical condition: Secondary | ICD-10-CM

## 2016-06-12 LAB — URINE CULTURE

## 2016-06-12 MED ORDER — PHENAZOPYRIDINE HCL 200 MG PO TABS
200.0000 mg | ORAL_TABLET | Freq: Three times a day (TID) | ORAL | 0 refills | Status: DC | PRN
Start: 2016-06-12 — End: 2016-06-20

## 2016-06-12 MED ORDER — FOSFOMYCIN TROMETHAMINE 3 G PO PACK: 3.0000 g | PACK | Freq: Once | ORAL | 0 refills | Status: AC

## 2016-06-12 MED ORDER — OXYCODONE-ACETAMINOPHEN 10-325 MG PO TABS: 1.0000 | ORAL_TABLET | Freq: Three times a day (TID) | ORAL | 0 refills | Status: DC | PRN

## 2016-06-12 NOTE — Progress Notes (Signed)
Pre-visit discussion using our clinic review tool. No additional management support is needed unless otherwise documented below in the visit note.  

## 2016-06-12 NOTE — Telephone Encounter (Signed)
done

## 2016-06-12 NOTE — Patient Instructions (Signed)
You have a  UTI that is resistant to cipro,  I have sent Fosfomycin to your pharmacy  congratulations on the weight loss!  Mammogram and DEXA scan have been ordered   Menopause is a normal process in which your reproductive ability comes to an end. This process happens gradually over a span of months to years, usually between the ages of 57 and 68. Menopause is complete when you have missed 12 consecutive menstrual periods. It is important to talk with your health care provider about some of the most common conditions that affect postmenopausal women, such as heart disease, cancer, and bone loss (osteoporosis). Adopting a healthy lifestyle and getting preventive care can help to promote your health and wellness. Those actions can also lower your chances of developing some of these common conditions. WHAT SHOULD I KNOW ABOUT MENOPAUSE? During menopause, you may experience a number of symptoms, such as:  Moderate-to-severe hot flashes.  Night sweats.  Decrease in sex drive.  Mood swings.  Headaches.  Tiredness.  Irritability.  Memory problems.  Insomnia. Choosing to treat or not to treat menopausal changes is an individual decision that you make with your health care provider. WHAT SHOULD I KNOW ABOUT HORMONE REPLACEMENT THERAPY AND SUPPLEMENTS? Hormone therapy products are effective for treating symptoms that are associated with menopause, such as hot flashes and night sweats. Hormone replacement carries certain risks, especially as you become older. If you are thinking about using estrogen or estrogen with progestin treatments, discuss the benefits and risks with your health care provider. WHAT SHOULD I KNOW ABOUT HEART DISEASE AND STROKE? Heart disease, heart attack, and stroke become more likely as you age. This may be due, in part, to the hormonal changes that your body experiences during menopause. These can affect how your body processes dietary fats, triglycerides, and  cholesterol. Heart attack and stroke are both medical emergencies. There are many things that you can do to help prevent heart disease and stroke:  Have your blood pressure checked at least every 1-2 years. High blood pressure causes heart disease and increases the risk of stroke.  If you are 58-2 years old, ask your health care provider if you should take aspirin to prevent a heart attack or a stroke.  Do not use any tobacco products, including cigarettes, chewing tobacco, or electronic cigarettes. If you need help quitting, ask your health care provider.  It is important to eat a healthy diet and maintain a healthy weight.  Be sure to include plenty of vegetables, fruits, low-fat dairy products, and lean protein.  Avoid eating foods that are high in solid fats, added sugars, or salt (sodium).  Get regular exercise. This is one of the most important things that you can do for your health.  Try to exercise for at least 150 minutes each week. The type of exercise that you do should increase your heart rate and make you sweat. This is known as moderate-intensity exercise.  Try to do strengthening exercises at least twice each week. Do these in addition to the moderate-intensity exercise.  Know your numbers.Ask your health care provider to check your cholesterol and your blood glucose. Continue to have your blood tested as directed by your health care provider. WHAT SHOULD I KNOW ABOUT CANCER SCREENING? There are several types of cancer. Take the following steps to reduce your risk and to catch any cancer development as early as possible. Breast Cancer  Practice breast self-awareness.  This means understanding how your breasts normally appear and  feel.  It also means doing regular breast self-exams. Let your health care provider know about any changes, no matter how small.  If you are 98 or older, have a clinician do a breast exam (clinical breast exam or CBE) every year. Depending on  your age, family history, and medical history, it may be recommended that you also have a yearly breast X-ray (mammogram).  If you have a family history of breast cancer, talk with your health care provider about genetic screening.  If you are at high risk for breast cancer, talk with your health care provider about having an MRI and a mammogram every year.  Breast cancer (BRCA) gene test is recommended for women who have family members with BRCA-related cancers. Results of the assessment will determine the need for genetic counseling and BRCA1 and for BRCA2 testing. BRCA-related cancers include these types:  Breast. This occurs in males or females.  Ovarian.  Tubal. This may also be called fallopian tube cancer.  Cancer of the abdominal or pelvic lining (peritoneal cancer).  Prostate.  Pancreatic. Cervical, Uterine, and Ovarian Cancer Your health care provider may recommend that you be screened regularly for cancer of the pelvic organs. These include your ovaries, uterus, and vagina. This screening involves a pelvic exam, which includes checking for microscopic changes to the surface of your cervix (Pap test).  For women ages 21-65, health care providers may recommend a pelvic exam and a Pap test every three years. For women ages 12-65, they may recommend the Pap test and pelvic exam, combined with testing for human papilloma virus (HPV), every five years. Some types of HPV increase your risk of cervical cancer. Testing for HPV may also be done on women of any age who have unclear Pap test results.  Other health care providers may not recommend any screening for nonpregnant women who are considered low risk for pelvic cancer and have no symptoms. Ask your health care provider if a screening pelvic exam is right for you.  If you have had past treatment for cervical cancer or a condition that could lead to cancer, you need Pap tests and screening for cancer for at least 20 years after your  treatment. If Pap tests have been discontinued for you, your risk factors (such as having a new sexual partner) need to be reassessed to determine if you should start having screenings again. Some women have medical problems that increase the chance of getting cervical cancer. In these cases, your health care provider may recommend that you have screening and Pap tests more often.  If you have a family history of uterine cancer or ovarian cancer, talk with your health care provider about genetic screening.  If you have vaginal bleeding after reaching menopause, tell your health care provider.  There are currently no reliable tests available to screen for ovarian cancer. Lung Cancer Lung cancer screening is recommended for adults 11-82 years old who are at high risk for lung cancer because of a history of smoking. A yearly low-dose CT scan of the lungs is recommended if you:  Currently smoke.  Have a history of at least 30 pack-years of smoking and you currently smoke or have quit within the past 15 years. A pack-year is smoking an average of one pack of cigarettes per day for one year. Yearly screening should:  Continue until it has been 15 years since you quit.  Stop if you develop a health problem that would prevent you from having lung cancer treatment.  Colorectal Cancer  This type of cancer can be detected and can often be prevented.  Routine colorectal cancer screening usually begins at age 66 and continues through age 22.  If you have risk factors for colon cancer, your health care provider may recommend that you be screened at an earlier age.  If you have a family history of colorectal cancer, talk with your health care provider about genetic screening.  Your health care provider may also recommend using home test kits to check for hidden blood in your stool.  A small camera at the end of a tube can be used to examine your colon directly (sigmoidoscopy or colonoscopy). This is  done to check for the earliest forms of colorectal cancer.  Direct examination of the colon should be repeated every 5-10 years until age 69. However, if early forms of precancerous polyps or small growths are found or if you have a family history or genetic risk for colorectal cancer, you may need to be screened more often. Skin Cancer  Check your skin from head to toe regularly.  Monitor any moles. Be sure to tell your health care provider:  About any new moles or changes in moles, especially if there is a change in a mole's shape or color.  If you have a mole that is larger than the size of a pencil eraser.  If any of your family members has a history of skin cancer, especially at a young age, talk with your health care provider about genetic screening.  Always use sunscreen. Apply sunscreen liberally and repeatedly throughout the day.  Whenever you are outside, protect yourself by wearing long sleeves, pants, a wide-brimmed hat, and sunglasses. WHAT SHOULD I KNOW ABOUT OSTEOPOROSIS? Osteoporosis is a condition in which bone destruction happens more quickly than new bone creation. After menopause, you may be at an increased risk for osteoporosis. To help prevent osteoporosis or the bone fractures that can happen because of osteoporosis, the following is recommended:  If you are 31-29 years old, get at least 1,000 mg of calcium and at least 600 mg of vitamin D per day.  If you are older than age 40 but younger than age 50, get at least 1,200 mg of calcium and at least 600 mg of vitamin D per day.  If you are older than age 33, get at least 1,200 mg of calcium and at least 800 mg of vitamin D per day. Smoking and excessive alcohol intake increase the risk of osteoporosis. Eat foods that are rich in calcium and vitamin D, and do weight-bearing exercises several times each week as directed by your health care provider. WHAT SHOULD I KNOW ABOUT HOW MENOPAUSE AFFECTS [place]? Depression may occur at any age, but it is more common as you become older. Common symptoms of depression include:  Low or sad mood.  Changes in sleep patterns.  Changes in appetite or eating patterns.  Feeling an overall lack of motivation or enjoyment of activities that you previously enjoyed.  Frequent crying spells. Talk with your health care provider if you think that you are experiencing depression. WHAT SHOULD I KNOW ABOUT IMMUNIZATIONS? It is important that you get and maintain your immunizations. These include:  Tetanus, diphtheria, and pertussis (Tdap) booster vaccine.  Influenza every year before the flu season begins.  Pneumonia vaccine.  Shingles vaccine. Your health care provider may also recommend other immunizations.   This information is not intended to replace advice given to you by  your health care provider. Make sure you discuss any questions you have with your health care provider.   Document Released: 09/29/2005 Document Revised: 08/28/2014 Document Reviewed: 04/09/2014 Elsevier Interactive Patient Education [employer].

## 2016-06-12 NOTE — Telephone Encounter (Signed)
Pt requested to have pyridium sent in while having blood drawn. Pt said OTC Azo is not enough.

## 2016-06-12 NOTE — Progress Notes (Signed)
Patient ID: [name], female    DOB: April 28, 1941  Age: 75 y.o. MRN: [id number]  The patient is here for annual physical  examination and management of other chronic and acute problems.   She is Overdue for mammogram and DEXA scan Colonoscopy 2016    Has a living will in place.  Husband [name] has POA ,  All 3 children including [name] and son [name] are closely involved in decision making)    The risk factors are reflected in the social history.  The roster of all physicians providing medical care to patient - is listed in the Snapshot section of the chart.  Activities of daily living:  The patient is 100% independent in all ADLs: dressing, toileting, feeding as well as independent mobility  Home safety : The patient has smoke detectors in the home. They wear seatbelts.  There are no firearms at home. There is no violence in the home.   There is no risks for hepatitis, STDs or HIV. There is no   history of blood transfusion. They have no travel history to infectious disease endemic areas of the world.  The patient has seen their dentist in the last six month. They have seen their eye doctor in the last year. They admit to slight hearing difficulty with regard to whispered voices and some television programs.  They have deferred audiologic testing in the last year.  They do not  have excessive sun exposure. Discussed the need for sun protection: hats, long sleeves and use of sunscreen if there is significant sun exposure.   Diet: the importance of a healthy diet is discussed. They do have a healthy diet.  The benefits of regular aerobic exercise were discussed. She walks 4 times per week ,  20 minutes.   Depression screen: there are no signs or vegative symptoms of depression- irritability, change in appetite, anhedonia, sadness/tearfullness.  Cognitive assessment: the patient manages all their financial and personal affairs and is actively engaged. They could relate  day,date,year and events; recalled 2/3 objects at 3 minutes; performed clock-face test normally.  The following portions of the patient's history were reviewed and updated as appropriate: allergies, current medications, past family history, past medical history,  past surgical history, past social history  and problem list.  Visual acuity was not assessed per patient preference since she has regular follow up with her ophthalmologist. Hearing and body mass index were assessed and reviewed.   During the course of the visit the patient was educated and counseled about appropriate screening and preventive services including : fall prevention , diabetes screening, nutrition counseling, colorectal cancer screening, and recommended immunizations.    CC: The primary encounter diagnosis was Essential hypertension. Diagnoses of Breast cancer screening, CKD (chronic kidney disease) stage 3, GFR 30-59 ml/min, Mixed hyperlipidemia, Adult hypothyroidism, Impaired fasting glucose, Vitamin D deficiency, Postmenopausal estrogen deficiency, Obesity (BMI 30-39.9), Preoperative clearance, Routine general medical examination at a health care facility, Status post lumbar spinal fusion, Acute cystitis without hematuria, Insomnia secondary to chronic pain, Anemia in other chronic diseases classified elsewhere, Aortic atherosclerosis ([place]), Atherosclerosis of native coronary artery of native heart without angina pectoris, and Major depressive disorder with single episode, in remission [facility]) were also pertinent to this visit.    1) She has been having dysuria for several days despite being on oral Ciprofloxacin for otitis.Her urinalysis is abnormal and the urine Culture finalized today and noted E Coli  resistant to F/Q and Sulfa, S to  cephalospoins, tobramycin  and Macrobid .  She has a PCN allergy.   2) She is having  ear surgery In November at [facility]) for chronic mastoiditis with subsequent development of a bleeding  cyst in left ear canal.    3) Back pain: She continues to require chronic use of oxycodone for management of low back pain .  She averages 2 tablets daily but reports that a 3rd tablet is needed 3 or 3 times er week.  She uses one valium tablet daily for concurrent management of muscle spasms.  Concurrent use of Lorrin Mais has been addressed and she is aware that she will no longer received refills on  Or Ambien CR for insomnia.   She has had Chronic back pain despite 5 prior surgeries, and no longer sees Pain management.   Refill history confirmed via [place] Controlled Substance databas, accessed by me today.Marland Kitchen  History Jillene has a past medical history of Asthma (06/2010); Blood transfusion (2009); Brain aneurysm; CHF (congestive heart failure) ([place]) (05/2010); Chronic sinusitis; Complication of anesthesia (05/30/2010); Complication of anesthesia; Complication of anesthesia; Degenerative disc disease; Discoid lupus erythematosus eyelid; Dysrhythmia; Headache(784.0); Hypercholesterolemia; Hypertension; Hypothyroidism; Pneumonia (05/30/2010); Renal insufficiency; Sleep apnea; and Systemic lupus erythematosus ([place]) (05/15/2012).   She has a past surgical history that includes Cosmetic surgery; Tonsillectomy (1963); Back surgery (1968,1988,2002,2009); Abdominal hysterectomy (1988); Brain surgery (2005); Excision Morton's neuroma (1990); Nasal sinus surgery [id number]); lipomas (1975); Tympanic membrane repair (2009-last one); Total knee arthroplasty (07/10/2011); Joint replacement (2013); Eye surgery; Foot arthrodesis (Right, 05/22/2013); right heart catheterization (N/A, 11/28/2013); Colonoscopy (2008); Colonoscopy with propofol (N/A, 01/13/2015); Cardiac catheterization (N/A, 01/25/2015); and Colonoscopy with propofol (N/A, 03/24/2015).   Her family history includes Alcohol abuse in her other; Cancer in her brother and sister; Heart disease in her mother; Stroke in her mother.She reports that she has never smoked. She has  never used smokeless tobacco. She reports that she drinks alcohol. She reports that she does not use drugs.  Outpatient Medications Prior to Visit  Medication Sig Dispense Refill  . amLODipine (NORVASC) 10 MG tablet TAKE 1 TABLET (10 MG TOTAL) BY MOUTH DAILY. 90 tablet 1  . Ascorbic Acid (VITAMIN C) 1000 MG tablet Take 500 mg by mouth daily.     . benzonatate (TESSALON) 200 MG capsule TAKE 1 CAPSULE (200 MG TOTAL) BY MOUTH 3 (THREE) TIMES DAILY AS NEEDED FOR COUGH. 60 capsule 3  . CLARINEX 5 MG tablet TAKE 1 TABLET (5 MG TOTAL) BY MOUTH EVERY MORNING. 90 tablet 3  . Coenzyme Q10 100 MG TABS Take 1 tablet by mouth daily.    Marland Kitchen COZAAR 50 MG tablet TAKE 1 TABLET BY MOUTH TWICE A DAY 180 tablet 4  . cyanocobalamin (,VITAMIN B-12,) 1000 MCG/ML injection FOR USE WITH MONTHLY B12 INJECTIONS 10 mL 1  . doxepin (SINEQUAN) 25 MG capsule TAKE 2 CAPSULES (50 MG TOTAL) BY MOUTH AT BEDTIME. 180 capsule 1  . escitalopram (LEXAPRO) 10 MG tablet Take 1 tablet (10 mg total) by mouth daily. 30 tablet 10  . escitalopram (LEXAPRO) 10 MG tablet TAKE 1 TABLET (10 MG TOTAL) BY MOUTH DAILY. 90 tablet 2  . fluticasone (FLONASE) 50 MCG/ACT nasal spray     . LIDODERM 5 % PLACE 3 PATCHES ONTO SKIN DAILY AND DISCARD WITH 12 HOURS OR AS DIRECTED 90 patch 5  . LOVAZA 1 G capsule TAKE 2 CAPSULES (2 G TOTAL) BY MOUTH 2 (TWO) TIMES DAILY. 360 capsule 3  . montelukast (SINGULAIR) 10 MG tablet Take 1  tablet (10 mg total) by mouth at bedtime. 90 tablet 1  . NEEDLE, DISP, 23 G (BD DISP NEEDLE) 23G X 1" MISC Uses to inject b12 into muscle once monthly 12 each 1  . nitroGLYCERIN (NITROSTAT) 0.4 MG SL tablet Place 1 tablet (0.4 mg total) under the tongue every 5 (five) minutes as needed for chest pain. 50 tablet 3  . OVER THE COUNTER MEDICATION Take 1 capsule by mouth daily. Florify    . polyethylene glycol powder (GLYCOLAX/MIRALAX) powder 255 grams one bottle for colonoscopy prep 255 g 0  . Prenatal Vit-Fe Fumarate-FA (PRENATAL 1 PLUS  1 PO) Take 1 tablet by mouth daily.    Marland Kitchen PREVIDENT 5000 DRY MOUTH 1.1 % GEL dental gel     . pyridOXINE (VITAMIN B-6) 100 MG tablet Take 100 mg by mouth daily.    . rosuvastatin (CRESTOR) 20 MG tablet TAKE 1 TABLET (20 MG TOTAL) BY MOUTH DAILY. 90 tablet 1  . sodium chloride (OCEAN) 0.65 % nasal spray Place 2 sprays into the nose as needed. Allergies      . SYNTHROID 137 MCG tablet TAKE 1 TABLET (137 MCG TOTAL) BY MOUTH DAILY BEFORE BREAKFAST. 90 tablet 1  . SYRINGE-NEEDLE, DISP, 3 ML 25G X 1" 3 ML MISC Please dispense so patient can administer B12 injections monthly 50 each 1  . tobramycin (NEBCIN) 80 MG/2ML SOLN injection     . VALIUM 5 MG tablet TAKE 1 TABLET BY MOUTH TWICE A DAY 60 tablet 1  . Vitamin D, Ergocalciferol, (DRISDOL) 50000 units CAPS capsule TAKE 1 CAPSULE (50,000 UNITS TOTAL) BY MOUTH EVERY 7 (SEVEN) DAYS. 12 capsule 0  . AMBIEN 5 MG tablet TAKE 1 TABLET BY MOUTH AT BEDTIME AS NEEDED FOR SLEEP 30 tablet 2  . isosorbide mononitrate (IMDUR) 60 MG 24 hr tablet TAKE 1 TABLET (60 MG TOTAL) BY MOUTH DAILY. 90 tablet 4  . oxyCODONE-acetaminophen (PERCOCET) 10-325 MG tablet Take 1 tablet by mouth 2 (two) times daily as needed for pain. 60 tablet 0  . spironolactone (ALDACTONE) 25 MG tablet TAKE 1 TABLET EVERY DAY (Patient not taking: Reported on 06/12/2016) 30 tablet 10   Facility-Administered Medications Prior to Visit  Medication Dose Route Frequency Provider Last Rate Last Dose  . chlorhexidine (HIBICLENS) 4 % liquid 4 application  60 mL Topical Once Gaynelle Arabian, MD        Review of Systems   Patient denies headache, fevers, malaise, unintentional weight loss, skin rash, eye pain, sinus congestion and sinus pain, sore throat, dysphagia,  hemoptysis , cough, dyspnea, wheezing, chest pain, palpitations, orthopnea, edema, abdominal pain, nausea, melena, diarrhea, constipation, flank pain, dysuria, hematuria, urinary  Frequency, nocturia, numbness, tingling, seizures,  Focal  weakness, Loss of consciousness,  Tremor, insomnia, depression, anxiety, and suicidal ideation.      Objective:  BP 124/64   Pulse 66   Temp 98.2 F (36.8 C) (Oral)   Resp 12   Ht 5\' 3"  (1.6 m)   Wt 213 lb 12 oz (97 kg)   SpO2 91%   BMI 37.86 kg/m   Physical Exam   General appearance: alert, cooperative and appears stated age Head: Normocephalic, without obvious abnormality, atraumatic Eyes: conjunctivae/corneas clear. PERRL, EOM's intact. Fundi benign. Ears:  Large cyst, left ear canal,  Nose: Nares normal. Septum midline. Mucosa normal. No drainage or sinus tenderness. Throat: lips, mucosa, and tongue normal; teeth and gums normal Neck: no adenopathy, no carotid bruit, no JVD, supple, symmetrical, trachea midline and thyroid  not enlarged, symmetric, no tenderness/mass/nodules Lungs: clear to auscultation bilaterally Breasts: normal appearance, no masses or tenderness Heart: regular rate and rhythm, S1, S2 normal, no murmur, click, rub or gallop Abdomen: soft, non-tender; bowel sounds normal; no masses,  no organomegaly Extremities: extremities normal, atraumatic, no cyanosis or edema Pulses: 2+ and symmetric Skin: Skin color, texture, turgor normal. No rashes or lesions Neurologic: Alert and oriented X 3, normal strength and tone. Normal symmetric reflexes. Normal coordination and gait.     Assessment & Plan:   Problem List Items Addressed This Visit    Hypertension - Primary    She has had several recurrent low readings and increased fluid retention and is requesting suspension of Imdur.  Trial of amlodipine only. Given her history of PAD(SMA stenosis)  and asymptomatic CAD,  May do  Better on Imdur rather than amlodipine.       Relevant Orders   Microalbumin / creatinine urine ratio   Anemia    Multifactorial, resolved with B12 and iron suplementation.  Up to date on colonoscopy (2016)   Lab Results  Component Value Date   WBC 8.7 04/08/2016   HGB 13.8  04/08/2016   HCT 39.9 04/08/2016   MCV 95.7 04/08/2016   PLT 156 04/08/2016         Obesity (BMI 30-39.9)    I have congratulated her in reduction of   BMI and encouraged  Continued weight loss with goal of 10% of body weigh over the next 6 months using a low glycemic index diet and regular exercise a minimum of 5 days per week.        Insomnia secondary to chronic pain    Lorrin Mais has been discontinued, due to chronic daily use of opioids and benzodiazepines.  Continue sinequan      Relevant Medications   oxyCODONE-acetaminophen (PERCOCET) 10-325 MG tablet   oxyCODONE-acetaminophen (PERCOCET) 10-325 MG tablet   Routine general medical examination at a health care facility    Annual comprehensive preventive exam was done as well as an evaluation and management of chronic conditions .  During the course of the visit the patient was educated and counseled about appropriate screening and preventive services including :  diabetes screening, lipid analysis with projected  10 year  risk for CAD , nutrition counseling, breast, cervical and colorectal cancer screening, and recommended immunizations.  Printed recommendations for health maintenance screenings was given      Preoperative clearance    She has CAD which has been asymptomatic, noted on prior  Chest CT and is taking crestor, and baby aspirin.  Right heart cath was normal in 2015 ,  As were PFTs.  She has OSA treated with CPAP.  She is considered low risk for events.        Coronary atherosclerosis    Again, asymptomatic, noted on CT.  Continue crestor,  Add asa.  Right heart catheterization in 2015 was normal.       Aortic atherosclerosis ([place])    With 85% stenosis of SMA by June 2016 CT angiogram.  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  Less than 15% stenosis post procedure (Dew). Lipids are well controlled on current statin therapy. Continue  Crestor and  baby aspirin daily if she can tolerate it.    Lab Results   Component Value Date   CHOL 151 10/05/2015   HDL 52.80 10/05/2015   LDLCALC 71 10/05/2015   LDLDIRECT 64.7 01/01/2012   TRIG 137.0 10/05/2015   CHOLHDL 3 10/05/2015  Lab Results  Component Value Date   ALT 22 10/05/2015   AST 18 10/05/2015   ALKPHOS 76 10/05/2015   BILITOT 0.8 10/05/2015         Status post lumbar spinal fusion    She has chronic pain despite prior corrective decompression surgeries,  Percocet has been refilled for #75/month x 3 months. Refill history has been confirmed via [place] Controlled Substance database, accessed by me today..      Major depressive disorder with single episode, in remission ([place])    Continue daily use of lexapro per patient preference.       UTI (urinary tract infection)    Resistant to flouroquinolones and Septra,  Has PCN allergy,  Fosfomycin and pyridium prescribed per patient request.       Adult hypothyroidism    Last TSH was normal . Repeat TSH is pending   Lab Results  Component Value Date   TSH 0.78 11/19/2015         Relevant Orders   TSH   Breast cancer screening   Relevant Orders   MM SCREENING BREAST TOMO BILATERAL   Hyperlipidemia   CKD (chronic kidney disease) stage 3, GFR 30-59 ml/min    Other Visit Diagnoses    Impaired fasting glucose       Relevant Orders   Comprehensive metabolic panel   Hemoglobin A1c   Vitamin D deficiency       Relevant Orders   VITAMIN D 25 Hydroxy (Vit-D Deficiency, Fractures)   Postmenopausal estrogen deficiency       Relevant Orders   DG Bone Density      I have discontinued Ms. Castorena's isosorbide mononitrate, spironolactone, AMBIEN, and oxyCODONE-acetaminophen. I have also changed her oxyCODONE-acetaminophen and oxyCODONE-acetaminophen. Additionally, I am having her start on fosfomycin. Lastly, I am having her maintain her sodium chloride, NEEDLE (DISP) 23 G, vitamin C, OVER THE COUNTER MEDICATION, Prenatal Vit-Fe Fumarate-FA (PRENATAL 1 PLUS 1 PO), pyridOXINE, Coenzyme  Q10, polyethylene glycol powder, COZAAR, LOVAZA, cyanocobalamin, fluticasone, PREVIDENT 5000 DRY MOUTH, tobramycin, rosuvastatin, montelukast, SYRINGE-NEEDLE (DISP) 3 ML, SYNTHROID, nitroGLYCERIN, benzonatate, LIDODERM, amLODipine, escitalopram, CLARINEX, escitalopram, Vitamin D (Ergocalciferol), VALIUM, doxepin, and ciprofloxacin.  Meds ordered this encounter  Medications  . ciprofloxacin (CIPRO) 750 MG tablet    Sig: Take 750 mg by mouth 2 (two) times daily.  . fosfomycin (MONUROL) 3 g PACK    Sig: Take 3 g by mouth once.    Dispense:  3 g    Refill:  0  . oxyCODONE-acetaminophen (PERCOCET) 10-325 MG tablet    Sig: Take 1 tablet by mouth every 8 (eight) hours as needed for pain. May refill on or after March 25 2016    Dispense:  75 tablet    Refill:  0  . DISCONTD: oxyCODONE-acetaminophen (PERCOCET) 10-325 MG tablet    Sig: Take 1 tablet by mouth every 8 (eight) hours as needed for pain. May refill on or after Jul 16 2016    Dispense:  60 tablet    Refill:  0  . oxyCODONE-acetaminophen (PERCOCET) 10-325 MG tablet    Sig: Take 1 tablet by mouth every 8 (eight) hours as needed for pain. May refill on or after Aug 15 2016    Dispense:  60 tablet    Refill:  0    Medications Discontinued During This Encounter  Medication Reason  . spironolactone (ALDACTONE) 25 MG tablet Change in therapy  . oxyCODONE-acetaminophen (PERCOCET) 10-325 MG tablet Reorder  . oxyCODONE-acetaminophen (PERCOCET) 10-325 MG  tablet Reorder  . AMBIEN 5 MG tablet Discontinued by provider  . isosorbide mononitrate (IMDUR) 60 MG 24 hr tablet     A total of 40 minutes was spent with patient more than half of which was spent in counseling patient on the above mentioned issues , reviewing and explaining recent labs and imaging studies done, and coordination of care.   Follow-up: Return in about 3 months (around 09/12/2016).   Crecencio Mc, MD

## 2016-06-13 ENCOUNTER — Encounter: Payer: Self-pay | Admitting: Internal Medicine

## 2016-06-13 ENCOUNTER — Other Ambulatory Visit: Payer: Self-pay | Admitting: Internal Medicine

## 2016-06-13 DIAGNOSIS — R809 Proteinuria, unspecified: Secondary | ICD-10-CM | POA: Insufficient documentation

## 2016-06-13 LAB — COMPREHENSIVE METABOLIC PANEL
ALT: 17 U/L (ref 0–35)
AST: 20 U/L (ref 0–37)
Albumin: 4.3 g/dL (ref 3.5–5.2)
Alkaline Phosphatase: 69 U/L (ref 39–117)
BUN: 18 mg/dL (ref 6–23)
CHLORIDE: 105 meq/L (ref 96–112)
CO2: 25 meq/L (ref 19–32)
CREATININE: 1.13 mg/dL (ref 0.40–1.20)
Calcium: 10.6 mg/dL — ABNORMAL HIGH (ref 8.4–10.5)
GFR: 50.12 mL/min — ABNORMAL LOW (ref >60.00)
GLUCOSE: 81 mg/dL (ref 70–99)
POTASSIUM: 3.8 meq/L (ref 3.5–5.1)
SODIUM: 139 meq/L (ref 135–145)
Total Bilirubin: 0.5 mg/dL (ref 0.2–1.2)
Total Protein: 7 g/dL (ref 6.0–8.3)

## 2016-06-13 LAB — TSH: TSH: 0.36 u[IU]/mL (ref 0.35–4.50)

## 2016-06-13 LAB — VITAMIN D 25 HYDROXY (VIT D DEFICIENCY, FRACTURES): VITD: 38.24 ng/mL (ref 30.00–100.00)

## 2016-06-13 LAB — MICROALBUMIN / CREATININE URINE RATIO
CREATININE, U: 37.4 mg/dL
MICROALB/CREAT RATIO: 44.4 mg/g — AB (ref 0.0–30.0)
Microalb, Ur: 16.6 mg/dL — ABNORMAL HIGH (ref 0.0–1.9)

## 2016-06-13 LAB — HEMOGLOBIN A1C: Hgb A1c MFr Bld: 5.3 % (ref 4.6–6.5)

## 2016-06-13 NOTE — Assessment & Plan Note (Signed)
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  Less than 15% stenosis post procedure (Dew). Lipids are well controlled on current statin therapy. Continue  Crestor and  baby aspirin daily if she can tolerate it.    Lab Results  Component Value Date   CHOL 151 10/05/2015   HDL 52.80 10/05/2015   LDLCALC 71 10/05/2015   LDLDIRECT 64.7 01/01/2012   TRIG 137.0 10/05/2015   CHOLHDL 3 10/05/2015   Lab Results  Component Value Date   ALT 22 10/05/2015   AST 18 10/05/2015   ALKPHOS 76 10/05/2015   BILITOT 0.8 10/05/2015

## 2016-06-13 NOTE — Assessment & Plan Note (Signed)
She has had several recurrent low readings and increased fluid retention and is requesting suspension of Imdur.  Trial of amlodipine only. Given her history of PAD(SMA stenosis)  and asymptomatic CAD,  May do  Better on Imdur rather than amlodipine.

## 2016-06-13 NOTE — Assessment & Plan Note (Signed)
I have congratulated her in reduction of   BMI and encouraged  Continued weight loss with goal of 10% of body weigh over the next 6 months using a low glycemic index diet and regular exercise a minimum of 5 days per week.    

## 2016-06-13 NOTE — Assessment & Plan Note (Signed)
Again, asymptomatic, noted on CT.  Continue crestor,  Add asa.  Right heart catheterization in 2015 was normal.

## 2016-06-13 NOTE — Assessment & Plan Note (Signed)
Resistant to flouroquinolones and Septra,  Has PCN allergy,  Fosfomycin and pyridium prescribed per patient request.

## 2016-06-13 NOTE — Assessment & Plan Note (Signed)
Annual comprehensive preventive exam was done as well as an evaluation and management of chronic conditions .  During the course of the visit the patient was educated and counseled about appropriate screening and preventive services including :  diabetes screening, lipid analysis with projected  10 year  risk for CAD , nutrition counseling, breast, cervical and colorectal cancer screening, and recommended immunizations.  Printed recommendations for health maintenance screenings was given 

## 2016-06-13 NOTE — Assessment & Plan Note (Signed)
Last TSH was normal . Repeat TSH is pending   Lab Results  Component Value Date   TSH 0.78 11/19/2015

## 2016-06-13 NOTE — Assessment & Plan Note (Signed)
Continue daily use of lexapro per patient preference.  

## 2016-06-13 NOTE — Assessment & Plan Note (Addendum)
She has CAD which has been asymptomatic, noted on prior  Chest CT and is taking crestor, and baby aspirin.  Right heart cath was normal in 2015 ,  As were PFTs.  She has OSA treated with CPAP.  She is considered low risk for events.

## 2016-06-13 NOTE — Assessment & Plan Note (Signed)
Multifactorial, resolved with B12 and iron suplementation.  Up to date on colonoscopy (2016)   Lab Results  Component Value Date   WBC 8.7 04/08/2016   HGB 13.8 04/08/2016   HCT 39.9 04/08/2016   MCV 95.7 04/08/2016   PLT 156 04/08/2016

## 2016-06-13 NOTE — Assessment & Plan Note (Signed)
She has chronic pain despite prior corrective decompression surgeries,  Percocet has been refilled for #75/month x 3 months. Refill history has been confirmed via [place] Controlled Substance database, accessed by me today.Marland Kitchen

## 2016-06-13 NOTE — Assessment & Plan Note (Signed)
ambien has been discontinued, due to chronic daily use of opioids and benzodiazepines.  Continue sinequan 

## 2016-06-15 ENCOUNTER — Encounter: Payer: Self-pay | Admitting: Internal Medicine

## 2016-06-20 ENCOUNTER — Encounter (INDEPENDENT_AMBULATORY_CARE_PROVIDER_SITE_OTHER): Payer: Self-pay | Admitting: Vascular Surgery

## 2016-06-20 ENCOUNTER — Ambulatory Visit (INDEPENDENT_AMBULATORY_CARE_PROVIDER_SITE_OTHER): Payer: Federal, State, Local not specified - PPO | Admitting: Vascular Surgery

## 2016-06-20 VITALS — BP 177/81 | HR 68 | Resp 16 | Ht 62.5 in | Wt 213.0 lb

## 2016-06-20 DIAGNOSIS — I10 Essential (primary) hypertension: Secondary | ICD-10-CM | POA: Diagnosis not present

## 2016-06-20 DIAGNOSIS — E782 Mixed hyperlipidemia: Secondary | ICD-10-CM | POA: Diagnosis not present

## 2016-06-20 DIAGNOSIS — I771 Stricture of artery: Secondary | ICD-10-CM

## 2016-06-20 DIAGNOSIS — K551 Chronic vascular disorders of intestine: Secondary | ICD-10-CM

## 2016-06-20 NOTE — Assessment & Plan Note (Signed)
lipid control important in reducing the progression of atherosclerotic disease. Continue statin therapy  

## 2016-06-20 NOTE — Assessment & Plan Note (Signed)
Her most recent duplex showed only minimally elevated velocities within her SMA stent. This is stable from her last several studies. She does not have any symptoms of chronic visceral ischemia. Plan to recheck this on an annual basis. Continue current medical regimen.

## 2016-06-20 NOTE — Progress Notes (Signed)
MRN : [id number]  [name] is a 75 y.o. (May 30, 1941) female who presents with chief complaint of  Chief Complaint  Patient presents with  . Re-evaluation    Follow up Ultrasound  .  History of Present Illness: Patient returns today in follow up of Her SMA stent. She had 1 episode of abdominal pain last month that was transient and went away within a few days. This was right lower quadrant pain. She does not have unintentional weight loss. She does not have food fear. Her mesenteric duplex from last month demonstrated elevated velocities in the IMA and celiac artery with a patent stent with only mildly elevated velocities. This had not progressed from her previous studies.  Current Outpatient Prescriptions  Medication Sig Dispense Refill  . amLODipine (NORVASC) 10 MG tablet TAKE 1 TABLET (10 MG TOTAL) BY MOUTH DAILY. 90 tablet 1  . Ascorbic Acid (VITAMIN C) 1000 MG tablet Take 500 mg by mouth daily.     . benzonatate (TESSALON) 200 MG capsule TAKE 1 CAPSULE (200 MG TOTAL) BY MOUTH 3 (THREE) TIMES DAILY AS NEEDED FOR COUGH. 60 capsule 3  . ciprofloxacin (CIPRO) 750 MG tablet Take 750 mg by mouth 2 (two) times daily.    Marland Kitchen CLARINEX 5 MG tablet TAKE 1 TABLET (5 MG TOTAL) BY MOUTH EVERY MORNING. 90 tablet 3  . Coenzyme Q10 100 MG TABS Take 1 tablet by mouth daily.    Marland Kitchen COZAAR 50 MG tablet TAKE 1 TABLET BY MOUTH TWICE A DAY 180 tablet 4  . cyanocobalamin (,VITAMIN B-12,) 1000 MCG/ML injection FOR USE WITH MONTHLY B12 INJECTIONS 10 mL 1  . doxepin (SINEQUAN) 25 MG capsule TAKE 2 CAPSULES (50 MG TOTAL) BY MOUTH AT BEDTIME. 180 capsule 1  . escitalopram (LEXAPRO) 10 MG tablet Take 1 tablet (10 mg total) by mouth daily. 30 tablet 10  . escitalopram (LEXAPRO) 10 MG tablet TAKE 1 TABLET (10 MG TOTAL) BY MOUTH DAILY. 90 tablet 2  . fluticasone (FLONASE) 50 MCG/ACT nasal spray     . levothyroxine (SYNTHROID, LEVOTHROID) 137 MCG tablet Take 137 mcg by mouth daily before breakfast.    . LIDODERM 5 %  PLACE 3 PATCHES ONTO SKIN DAILY AND DISCARD WITH 12 HOURS OR AS DIRECTED 90 patch 5  . LOVAZA 1 G capsule TAKE 2 CAPSULES (2 G TOTAL) BY MOUTH 2 (TWO) TIMES DAILY. 360 capsule 3  . montelukast (SINGULAIR) 10 MG tablet Take 1 tablet (10 mg total) by mouth at bedtime. 90 tablet 1  . NEEDLE, DISP, 23 G (BD DISP NEEDLE) 23G X 1" MISC Uses to inject b12 into muscle once monthly 12 each 1  . nitroGLYCERIN (NITROSTAT) 0.4 MG SL tablet Place 1 tablet (0.4 mg total) under the tongue every 5 (five) minutes as needed for chest pain. 50 tablet 3  . OVER THE COUNTER MEDICATION Take 1 capsule by mouth daily. Florify    . oxyCODONE-acetaminophen (PERCOCET) 10-325 MG tablet Take 1 tablet by mouth every 8 (eight) hours as needed for pain. May refill on or after March 25 2016 75 tablet 0  . oxyCODONE-acetaminophen (PERCOCET) 10-325 MG tablet Take 1 tablet by mouth every 8 (eight) hours as needed for pain. May refill on or after Aug 15 2016 60 tablet 0  . Prenatal Vit-Fe Fumarate-FA (PRENATAL 1 PLUS 1 PO) Take 1 tablet by mouth daily.    Marland Kitchen PREVIDENT 5000 DRY MOUTH 1.1 % GEL dental gel     . pyridOXINE (VITAMIN  B-6) 100 MG tablet Take 100 mg by mouth daily.    . rosuvastatin (CRESTOR) 20 MG tablet TAKE 1 TABLET (20 MG TOTAL) BY MOUTH DAILY. 90 tablet 1  . sodium chloride (OCEAN) 0.65 % nasal spray Place 2 sprays into the nose as needed. Allergies      . SYNTHROID 137 MCG tablet TAKE 1 TABLET (137 MCG TOTAL) BY MOUTH DAILY BEFORE BREAKFAST. 90 tablet 1  . SYRINGE-NEEDLE, DISP, 3 ML 25G X 1" 3 ML MISC Please dispense so patient can administer B12 injections monthly 50 each 1  . tobramycin (NEBCIN) 80 MG/2ML SOLN injection     . VALIUM 5 MG tablet TAKE 1 TABLET BY MOUTH TWICE A DAY 60 tablet 1  . Vitamin D, Ergocalciferol, (DRISDOL) 50000 units CAPS capsule TAKE 1 CAPSULE (50,000 UNITS TOTAL) BY MOUTH EVERY 7 (SEVEN) DAYS. 12 capsule 0   No current facility-administered medications for this visit.     Facility-Administered Medications Ordered in Other Visits  Medication Dose Route Frequency Provider Last Rate Last Dose  . chlorhexidine (HIBICLENS) 4 % liquid 4 application  60 mL Topical Once Gaynelle Arabian, MD        Past Medical History:  Diagnosis Date  . Asthma 06/2010   depending on what around  . Blood transfusion 2009   back surgery  . Brain aneurysm    S/P RESECTION  . CHF (congestive heart failure) ([place]) 05/2010   after receiving Propofol  . Chronic sinusitis   . Complication of anesthesia 05/30/2010   from colonoscopy-had Propofol-had a  . Complication of anesthesia    reaction-went into asthma-pneumonia,-  . Complication of anesthesia    then CHF and to ICU  . Degenerative disc disease    HERNITAED DISC BY MRI ON 07/07;S/P LAMINECTOMY,RID REPLACEMENT  . Discoid lupus erythematosus eyelid    NEGATIVE SEROLOGIC MARKERS  . Dysrhythmia    PVC's occ.  Marland Kitchen Headache(784.0)    migraines occ.  Marland Kitchen Hypercholesterolemia   . Hypertension   . Hypothyroidism    takes Synthroid  . Pneumonia 05/30/2010   after receiving Propofol  . Renal insufficiency    Patient states she has been told she has Stage I kidney disease  . Sleep apnea    uses CPAP-8.4-oxygen suppliment  . Systemic lupus erythematosus ([place]) 05/15/2012    Past Surgical History:  Procedure Laterality Date  . ABDOMINAL HYSTERECTOMY  1988   uterus only  . BACK SURGERY  1968,1988,2002,2009   4 laminectomies and fusions  . BRAIN SURGERY  2005   coiling of cranial aneurysum  . COLONOSCOPY  2008  . COLONOSCOPY WITH PROPOFOL N/A 01/13/2015   Procedure: COLONOSCOPY WITH PROPOFOL;  Surgeon: Christene Lye, MD;  Location: [facility] END[facility];  Service: Endoscopy;  Laterality: N/A;  . COLONOSCOPY WITH PROPOFOL N/A 03/24/2015   Procedure: COLONOSCOPY WITH PROPOFOL;  Surgeon: Christene Lye, MD;  Location: [facility] ENDOSCOPY;  Service: Endoscopy;  Laterality: N/A;  . COSMETIC SURGERY    . [place]   left foot  . EYE SURGERY     both cataracts -[place] 7/14  . FOOT ARTHRODESIS Right 05/22/2013   Procedure: RIGHT HALLUX METATARSAL PHALANGEAL JOINT ARTHRODESIS ;  Surgeon: Wylene Simmer, MD;  Location: [facility];  Service: Orthopedics;  Laterality: Right;  . JOINT REPLACEMENT  2013   Right knee,  [place]  . lipomas  1975   3 from right breast  . NASAL SINUS SURGERY  1977-2008  . PERIPHERAL VASCULAR  CATHETERIZATION N/A 01/25/2015   Procedure: Visceral Angiography;  Surgeon: Algernon Huxley, MD;  Location: [facility];  Service: Cardiovascular;  Laterality: N/A;  . RIGHT HEART CATHETERIZATION N/A 11/28/2013   Procedure: RIGHT HEART CATH;  Surgeon: Jolaine Artist, MD;  Location: [facility] CATH LAB;  Service: Cardiovascular;  Laterality: N/A;  . TONSILLECTOMY  1963  . TOTAL KNEE ARTHROPLASTY  07/10/2011   Procedure: TOTAL KNEE ARTHROPLASTY;  Surgeon: Gearlean Alf;  Location: [facility];  Service: Orthopedics;  Laterality: Right;  . TYMPANIC MEMBRANE REPAIR  2009-last one   x5 times    Social History Social History  Substance Use Topics  . Smoking status: Never Smoker  . Smokeless tobacco: Never Used     Comment: REMOTELY QUIT  TOBACCO USE  . Alcohol use Yes     Comment: occasional      Family History Family History  Problem Relation Age of Onset  . Heart disease Mother   . Stroke Mother   . Alcohol abuse Other   . Cancer Sister     leukemia  . Cancer Brother     lung Ca, asbestos     Allergies  Allergen Reactions  . Penicillins Anaphylaxis    Septra ANTI-INFECTIVE AGENTS-MISC..WELTS,ITCHING  . Penicillins Hives  . Propofol Other (See Comments)    O2 drops   . Propranolol     chf  . Triamcinolone Acetonide Hives  . Adhesive [Tape] Rash  . Kenalog [Triamcinolone Acetonide] Itching and Rash  . Septra [Bactrim] Swelling and Rash  . Sulfur Rash     REVIEW OF SYSTEMS (Negative unless checked)  Constitutional: [] Weight loss  [] Fever   [] Chills Cardiac: [] Chest pain   [] Chest pressure   [] Palpitations   [] Shortness of breath when laying flat   [] Shortness of breath at rest   [] Shortness of breath with exertion. Vascular:  [] Pain in legs with walking   [] Pain in legs at rest   [] Pain in legs when laying flat   [] Claudication   [] Pain in feet when walking  [] Pain in feet at rest  [] Pain in feet when laying flat   [] History of DVT   [] Phlebitis   [x] Swelling in legs   [x] Varicose veins   [] Non-healing ulcers Pulmonary:   [] Uses home oxygen   [] Productive cough   [] Hemoptysis   [] Wheeze  [] COPD   [] Asthma Neurologic:  [] Dizziness  [] Blackouts   [] Seizures   [] History of stroke   [] History of TIA  [] Aphasia   [] Temporary blindness   [] Dysphagia   [] Weakness or numbness in arms   [] Weakness or numbness in legs Musculoskeletal:  [] Arthritis   [] Joint swelling   [] Joint pain   [] Low back pain Hematologic:  [] Easy bruising  [] Easy bleeding   [] Hypercoagulable state   [] Anemic   Gastrointestinal:  [] Blood in stool   [] Vomiting blood  [] Gastroesophageal reflux/heartburn   [x] Abdominal pain Genitourinary:  [] Chronic kidney disease   [] Difficult urination  [] Frequent urination  [] Burning with urination   [] Hematuria Skin:  [] Rashes   [] Ulcers   [] Wounds Psychological:  [] History of anxiety   []  History of major depression.  Physical Examination  BP (!) 177/81 (BP Location: Right Arm)   Pulse 68   Resp 16   Ht 5' 2.5" (1.588 m)   Wt 213 lb (96.6 kg)   BMI 38.34 kg/m  Gen:  WD/WN, NAD Head: [place]/AT, No temporalis wasting. Ear/Nose/Throat: Hearing grossly intact, nares w/o erythema or drainage, trachea midline Eyes: Conjunctiva clear. Sclera non-icteric Neck:  Supple.  No JVD.  Pulmonary:  Good air movement, no use of accessory muscles.  Cardiac: RRR, normal S1, S2 Vascular:  Vessel Right Left  Radial Palpable Palpable                                   Gastrointestinal: soft, non-tender/non-distended. No guarding/reflex.   Musculoskeletal: M/S 5/5 throughout.  No deformity or atrophy. 1+ bilateral lower extremity edema. Neurologic: Sensation grossly intact in extremities.  Symmetrical.  Speech is fluent.  Psychiatric: Judgment intact, Mood & affect appropriate for pt's clinical situation. Dermatologic: No rashes or ulcers noted.  No cellulitis or open wounds. Lymph : No Cervical, Axillary, or Inguinal lymphadenopathy.      Labs Recent Results (from the past 2160 hour(s))  CBC     Status: None   Collection Time: 04/08/16  2:09 AM  Result Value Ref Range   WBC 8.7 3.6 - 11.0 K/uL   RBC 4.17 3.80 - 5.20 MIL/uL   Hemoglobin 13.8 12.0 - 16.0 g/dL   HCT 39.9 35.0 - 47.0 %   MCV 95.7 80.0 - 100.0 fL   MCH 33.2 26.0 - 34.0 pg   MCHC 34.7 32.0 - 36.0 g/dL   RDW 12.1 11.5 - 14.5 %   Platelets 156 150 - 440 K/uL  Basic metabolic panel     Status: Abnormal   Collection Time: 04/08/16  2:09 AM  Result Value Ref Range   Sodium 139 135 - 145 mmol/L   Potassium 4.0 3.5 - 5.1 mmol/L   Chloride 108 101 - 111 mmol/L   CO2 23 22 - 32 mmol/L   Glucose, Bld 98 65 - 99 mg/dL   BUN 25 (H) 6 - 20 mg/dL   Creatinine, Ser 1.54 (H) 0.44 - 1.00 mg/dL   Calcium 9.8 8.9 - 10.3 mg/dL   GFR calc non Af Amer 32 (L) >60 mL/min   GFR calc Af Amer 37 (L) >60 mL/min    Comment: (NOTE) The eGFR has been calculated using the CKD EPI equation. This calculation has not been validated in all clinical situations. eGFR's persistently <60 mL/min signify possible Chronic Kidney Disease.    Anion gap 8 5 - 15  Urine Culture     Status: None   Collection Time: 06/09/16  4:05 PM  Result Value Ref Range   Culture ESCHERICHIA COLI    Colony Count Greater than 100,000 CFU/mL    Organism ID, Bacteria ESCHERICHIA COLI       Susceptibility   Escherichia coli -  (no method available)    CEFAZOLIN 23 Sensitive     AMPICILLIN 8 Sensitive     AMOX/CLAVULANIC <=2 Sensitive     AMPICILLIN/SULBACTAM 4 Sensitive     PIP/TAZO <=4 Sensitive      IMIPENEM <=0.25 Sensitive     CEFTRIAXONE <=1 Sensitive     CEFTAZIDIME <=1 Sensitive     CEFEPIME <=1 Sensitive     GENTAMICIN <=1 Sensitive     TOBRAMYCIN <=1 Sensitive     CIPROFLOXACIN >=4 Resistant     LEVOFLOXACIN >=8 Resistant     NITROFURANTOIN <=16 Sensitive     TRIMETH/SULFA* >=320 Resistant      * NR=NOT REPORTABLE,SEE COMMENTORAL therapy:A cefazolin MIC of <32 predicts susceptibility to the oral agents cefaclor,cefdinir,cefpodoxime,cefprozil,cefuroxime,cephalexin,and loracarbef when used for therapy of uncomplicated UTIs due to E.coli,K.pneumomiae,and P.mirabilis. PARENTERAL therapy: A cefazolinMIC of >8 indicates resistance to parenteralcefazolin. An alternate  test method must beperformed to confirm susceptibility to parenteralcefazolin.  Urinalysis, Routine w reflex microscopic     Status: Abnormal   Collection Time: 06/09/16  4:09 PM  Result Value Ref Range   Color, Urine YELLOW YELLOW   APPearance CLEAR CLEAR   Specific Gravity, Urine 1.006 1.001 - 1.035   pH 6.0 5.0 - 8.0   Glucose, UA NEGATIVE NEGATIVE   Bilirubin Urine NEGATIVE NEGATIVE   Ketones, ur NEGATIVE NEGATIVE   Hgb urine dipstick NEGATIVE NEGATIVE   Protein, ur 1+ (A) NEGATIVE   Nitrite NEGATIVE NEGATIVE   Leukocytes, UA 1+ (A) NEGATIVE  Urine Microscopic     Status: Abnormal   Collection Time: 06/09/16  4:09 PM  Result Value Ref Range   WBC, UA 20-40 (A) <=5 WBC/HPF   RBC / HPF NONE SEEN <=2 RBC/HPF   Squamous Epithelial / LPF NONE SEEN <=5 HPF   Bacteria, UA MANY (A) NONE SEEN HPF   Crystals NONE SEEN NONE SEEN HPF   Casts NONE SEEN NONE SEEN LPF   Yeast NONE SEEN NONE SEEN HPF  POCT Urinalysis Dipstick     Status: Abnormal   Collection Time: 06/09/16  4:10 PM  Result Value Ref Range   Color, UA yellow    Clarity, UA clear    Glucose, UA negative    Bilirubin, UA negative    Ketones, UA negative    Spec Grav, UA <=1.005    Blood, UA negative    pH, UA 6.0    Protein, UA 100     Urobilinogen, UA 0.2    Nitrite, UA negative    Leukocytes, UA small (1+) (A) Negative  VITAMIN D 25 Hydroxy (Vit-D Deficiency, Fractures)     Status: None   Collection Time: 06/12/16  4:27 PM  Result Value Ref Range   VITD 38.24 30.00 - 100.00 ng/mL  Comprehensive metabolic panel     Status: Abnormal   Collection Time: 06/12/16  4:27 PM  Result Value Ref Range   Sodium 139 135 - 145 mEq/L   Potassium 3.8 3.5 - 5.1 mEq/L   Chloride 105 96 - 112 mEq/L   CO2 25 19 - 32 mEq/L   Glucose, Bld 81 70 - 99 mg/dL   BUN 18 6 - 23 mg/dL   Creatinine, Ser 1.13 0.40 - 1.20 mg/dL   Total Bilirubin 0.5 0.2 - 1.2 mg/dL   Alkaline Phosphatase 69 39 - 117 U/L   AST 20 0 - 37 U/L   ALT 17 0 - 35 U/L   Total Protein 7.0 6.0 - 8.3 g/dL   Albumin 4.3 3.5 - 5.2 g/dL   Calcium 10.6 (H) 8.4 - 10.5 mg/dL   GFR 50.12 (L) >60.00 mL/min  TSH     Status: None   Collection Time: 06/12/16  4:27 PM  Result Value Ref Range   TSH 0.36 0.35 - 4.50 uIU/mL  Hemoglobin A1c     Status: None   Collection Time: 06/12/16  4:27 PM  Result Value Ref Range   Hgb A1c MFr Bld 5.3 4.6 - 6.5 %    Comment: Glycemic Control Guidelines for People with Diabetes:Non Diabetic:  <6%Goal of Therapy: <7%Additional Action Suggested:  >8%   Microalbumin / creatinine urine ratio     Status: Abnormal   Collection Time: 06/12/16  4:27 PM  Result Value Ref Range   Microalb, Ur 16.6 (H) 0.0 - 1.9 mg/dL   Creatinine,U 37.4 mg/dL   Microalb Creat Ratio 44.4 (H)  0.0 - 30.0 mg/g    Radiology No results found.    Assessment/Plan  Hyperlipidemia lipid control important in reducing the progression of atherosclerotic disease. Continue statin therapy   Hypertension blood pressure control important in reducing the progression of atherosclerotic disease. On appropriate oral medications.   SMA stenosis Her most recent duplex showed only minimally elevated velocities within her SMA stent. This is stable from her last several studies.  She does not have any symptoms of chronic visceral ischemia. Plan to recheck this on an annual basis. Continue current medical regimen.    Leotis Pain, MD  06/20/2016 4:19 PM    This note was created with Dragon medical transcription system.  Any errors from dictation are purely unintentional

## 2016-06-20 NOTE — Assessment & Plan Note (Signed)
blood pressure control important in reducing the progression of atherosclerotic disease. On appropriate oral medications.  

## 2016-06-21 ENCOUNTER — Other Ambulatory Visit: Payer: Self-pay | Admitting: Internal Medicine

## 2016-06-21 NOTE — Telephone Encounter (Signed)
Received a refill request on Isosorbide. It was discontinued from med list on 06/13/16.

## 2016-06-27 NOTE — Telephone Encounter (Signed)
Mailed unread message to patient.  

## 2016-06-28 ENCOUNTER — Telehealth: Payer: Self-pay | Admitting: *Deleted

## 2016-06-28 DIAGNOSIS — E7800 Pure hypercholesterolemia, unspecified: Secondary | ICD-10-CM

## 2016-06-28 DIAGNOSIS — R809 Proteinuria, unspecified: Secondary | ICD-10-CM

## 2016-06-28 NOTE — Telephone Encounter (Signed)
Patient has requested a call to discuss changes in blood pressure medication. She received a letter and will need clarification.  Pt contact [phone]

## 2016-06-29 NOTE — Telephone Encounter (Signed)
Spoke with Patient she received her unread my chart lab results,  Your CBC, thyroid , liver and kidney function are normal. Your urine test for Microscopic protein was slightly abnormal/positive, which means that the hypertension is starting to affect your kidney's ability to filter the protein out of the urine . If you are no longer taking Cozaar, This would be a better choice for your hypertension than continuing amlodipine . Please let me know if you need a new prescription for it and I will send a new rx to your pharmacy .    Also, Your calcium level is a little elevated. If you are taking calcium supplements, please suspend them and return in a month for a repeat blood test . I would like to run a couple of additional blood tests to see if it is genuine and due to an overactive parathyroid gland. You will need to return for the blood tests but you do not need to fast. Please call the office to make a lab appointment.    She is currently taking amlodopine, Imdur and cozaar currently, no calcium supplements.  Only drinks one glass of Soy milk a day.  Her BP at home has been 136/68 on average.   Please advise what she should be doing. thanks

## 2016-06-29 NOTE — Telephone Encounter (Signed)
Patient advised of below and verbalized understanding.  She is wondering if she can have cholesterol checked since she has been working on diet , according to chart is hasn't been checked since 09/2015.  Please advise.

## 2016-06-29 NOTE — Telephone Encounter (Signed)
Please call, thanks 

## 2016-06-29 NOTE — Telephone Encounter (Signed)
Continue current meds and continue drinking soy milk. .  return for blood and urine tests.

## 2016-06-29 NOTE — Telephone Encounter (Signed)
ADDED

## 2016-06-30 ENCOUNTER — Other Ambulatory Visit: Payer: Self-pay | Admitting: Internal Medicine

## 2016-06-30 NOTE — Telephone Encounter (Signed)
Patient advised.

## 2016-07-13 ENCOUNTER — Other Ambulatory Visit: Payer: Self-pay | Admitting: Internal Medicine

## 2016-07-21 ENCOUNTER — Other Ambulatory Visit: Payer: Self-pay | Admitting: Internal Medicine

## 2016-07-24 ENCOUNTER — Telehealth: Payer: Self-pay | Admitting: Internal Medicine

## 2016-07-24 MED ORDER — OMEGA-3-ACID ETHYL ESTERS 1 G PO CAPS: ORAL_CAPSULE | ORAL | 3 refills | Status: AC

## 2016-07-24 NOTE — Telephone Encounter (Signed)
Pt called about the quantity of oxyCODONE-acetaminophen (PERCOCET) 10-325 MG tablet should be 75 according to her Rx in November. And the one she got today was 60. Please advise?

## 2016-07-24 NOTE — Telephone Encounter (Signed)
Patient wants to know why she was prescribed 60 today and last time received 75 tablets oxycodone.

## 2016-07-24 NOTE — Telephone Encounter (Signed)
Refill sent.

## 2016-07-24 NOTE — Telephone Encounter (Signed)
Pt called about needing a refill for LOVAZA 1 G capsule.  Pharmacy is [id number] - [place], [address]  Call pt @ [phone]. Thank you!

## 2016-07-25 NOTE — Telephone Encounter (Signed)
Because of her concurrent use of valium twice daily, I am no longer comfortable with her using more than two extrea strength Percocet daily .

## 2016-07-25 NOTE — Telephone Encounter (Signed)
Patient notified and voiced understanding.

## 2016-08-02 ENCOUNTER — Ambulatory Visit: Payer: Federal, State, Local not specified - PPO

## 2016-08-02 ENCOUNTER — Other Ambulatory Visit: Payer: Federal, State, Local not specified - PPO

## 2016-08-03 ENCOUNTER — Other Ambulatory Visit: Payer: Federal, State, Local not specified - PPO

## 2016-08-07 ENCOUNTER — Other Ambulatory Visit (INDEPENDENT_AMBULATORY_CARE_PROVIDER_SITE_OTHER): Payer: Federal, State, Local not specified - PPO

## 2016-08-07 DIAGNOSIS — E7800 Pure hypercholesterolemia, unspecified: Secondary | ICD-10-CM

## 2016-08-07 DIAGNOSIS — R809 Proteinuria, unspecified: Secondary | ICD-10-CM

## 2016-08-07 LAB — BASIC METABOLIC PANEL
BUN: 30 mg/dL — ABNORMAL HIGH (ref 6–23)
CALCIUM: 9.7 mg/dL (ref 8.4–10.5)
CHLORIDE: 109 meq/L (ref 96–112)
CO2: 25 mEq/L (ref 19–32)
CREATININE: 1.16 mg/dL (ref 0.40–1.20)
GFR: 48.6 mL/min — ABNORMAL LOW (ref >60.00)
Glucose, Bld: 102 mg/dL — ABNORMAL HIGH (ref 70–99)
Potassium: 4.1 mEq/L (ref 3.5–5.1)
Sodium: 143 mEq/L (ref 135–145)

## 2016-08-07 LAB — LIPID PANEL
CHOLESTEROL: 136 mg/dL (ref 0–200)
HDL: 48.7 mg/dL (ref >39.00)
LDL Cholesterol: 57 mg/dL (ref 0–99)
NonHDL: 87.74
TRIGLYCERIDES: 152 mg/dL — AB (ref 0.0–149.0)
Total CHOL/HDL Ratio: 3
VLDL: 30.4 mg/dL (ref 0.0–40.0)

## 2016-08-08 LAB — PTH, INTACT AND CALCIUM
CALCIUM: 9.7 mg/dL (ref 8.6–10.4)
PTH: 143 pg/mL — AB (ref 14–64)

## 2016-08-09 LAB — PROTEIN ELECTROPHORESIS, SERUM
ABNORMAL PROTEIN BAND1: 0.1 g/dL
Albumin ELP: 3.6 g/dL — ABNORMAL LOW (ref 3.8–4.8)
Alpha-1-Globulin: 0.4 g/dL — ABNORMAL HIGH (ref 0.2–0.3)
Alpha-2-Globulin: 1 g/dL — ABNORMAL HIGH (ref 0.5–0.9)
BETA 2: 0.3 g/dL (ref 0.2–0.5)
Beta Globulin: 0.3 g/dL — ABNORMAL LOW (ref 0.4–0.6)
GAMMA GLOBULIN: 0.6 g/dL — AB (ref 0.8–1.7)
TOTAL PROTEIN, SERUM ELECTROPHOR: 6.1 g/dL (ref 6.1–8.1)

## 2016-08-09 LAB — IFE AND PE, RANDOM URINE
% BETA, Urine: 11.3 %
ALBUMIN, U: 68.3 %
ALPHA 1 URINE: 4.3 %
ALPHA-2-GLOBULIN, U: 7.3 %
GAMMA GLOBULIN URINE: 8.8 %
Protein, Ur: 49.9 mg/dL

## 2016-08-10 ENCOUNTER — Telehealth: Payer: Self-pay

## 2016-08-10 ENCOUNTER — Other Ambulatory Visit (INDEPENDENT_AMBULATORY_CARE_PROVIDER_SITE_OTHER): Payer: Federal, State, Local not specified - PPO

## 2016-08-10 DIAGNOSIS — R300 Dysuria: Secondary | ICD-10-CM | POA: Diagnosis not present

## 2016-08-10 LAB — URINALYSIS, ROUTINE W REFLEX MICROSCOPIC
Bilirubin Urine: NEGATIVE
HGB URINE DIPSTICK: NEGATIVE
Ketones, ur: NEGATIVE
Nitrite: POSITIVE — AB
Specific Gravity, Urine: 1.015 (ref 1.000–1.030)
Total Protein, Urine: 100 — AB
URINE GLUCOSE: NEGATIVE
Urobilinogen, UA: 0.2 (ref 0.0–1.0)
pH: 6 (ref 5.0–8.0)

## 2016-08-10 LAB — POCT URINALYSIS DIPSTICK
BILIRUBIN UA: NEGATIVE
GLUCOSE UA: NEGATIVE
KETONES UA: NEGATIVE
Nitrite, UA: POSITIVE
Protein, UA: 100
SPEC GRAV UA: 1.02
Urobilinogen, UA: 0.2
pH, UA: 5.5

## 2016-08-10 MED ORDER — PHENAZOPYRIDINE HCL 200 MG PO TABS: 200.0000 mg | ORAL_TABLET | Freq: Three times a day (TID) | ORAL | 0 refills | Status: DC | PRN

## 2016-08-10 NOTE — Telephone Encounter (Signed)
Patient called asking for some pain medication for her burning sensation and is requesting pyridium. Patient came in today and left a urine sample.. Please advise

## 2016-08-10 NOTE — Telephone Encounter (Signed)
Patient notified

## 2016-08-10 NOTE — Telephone Encounter (Signed)
PYRIDIUM SENT

## 2016-08-10 NOTE — Telephone Encounter (Signed)
Pt wants [name] to call her back regarding the urine sample that she left today. She is also requesting a rx for pyridium called in for the pain that she is feeling while urinating. Pt is aware that [name] is gone for the day.

## 2016-08-10 NOTE — Telephone Encounter (Signed)
Left message for patient to c/b

## 2016-08-11 ENCOUNTER — Telehealth: Payer: Self-pay | Admitting: *Deleted

## 2016-08-11 ENCOUNTER — Other Ambulatory Visit: Payer: Self-pay | Admitting: Internal Medicine

## 2016-08-11 ENCOUNTER — Encounter: Payer: Self-pay | Admitting: Internal Medicine

## 2016-08-11 MED ORDER — FOSFOMYCIN TROMETHAMINE 3 G PO PACK: 3.0000 g | PACK | Freq: Once | ORAL | 0 refills | Status: AC

## 2016-08-11 NOTE — Telephone Encounter (Signed)
Since the holiday weekend is coming,  I will call in an antibiotic for her to take. Let her know that the antibitoic may have to be changed once the culture results are final

## 2016-08-11 NOTE — Telephone Encounter (Signed)
Patient stated that she has started to pass a small amount of blood  Pt stated that she has been diagnosed with a UTI 12/21 , and has not passed blood until today . She requested a medication be called into the pharmacy. She stated a message can be left with her husband.  Pt contact [phone]

## 2016-08-11 NOTE — Telephone Encounter (Signed)
Patient is having blood in urine and burning when urinating. Results are in for dip and ua but no culture.

## 2016-08-11 NOTE — Telephone Encounter (Signed)
Patient aware of medication called to pharmacy and that medication may need to be changed once culture is in.

## 2016-08-12 ENCOUNTER — Other Ambulatory Visit: Payer: Self-pay | Admitting: Internal Medicine

## 2016-08-12 LAB — URINE CULTURE

## 2016-08-13 ENCOUNTER — Encounter: Payer: Self-pay | Admitting: Internal Medicine

## 2016-08-20 ENCOUNTER — Other Ambulatory Visit: Payer: Self-pay | Admitting: Internal Medicine

## 2016-08-22 NOTE — Telephone Encounter (Signed)
Denied.  Due to concurrent use of opioids and sinequan.  Per oct 23rd note.

## 2016-08-22 NOTE — Telephone Encounter (Signed)
Last OV 10/17 ok to  Fill Ambien?

## 2016-08-23 NOTE — Telephone Encounter (Signed)
Patient notified and voiced understanding.

## 2016-09-04 ENCOUNTER — Other Ambulatory Visit: Payer: Self-pay | Admitting: Internal Medicine

## 2016-09-05 NOTE — Telephone Encounter (Signed)
Should patient still be on the mega dose of Vitamin D? This was last sent in October for 10 tablets & no refills.

## 2016-09-12 ENCOUNTER — Ambulatory Visit: Payer: Federal, State, Local not specified - PPO | Admitting: Internal Medicine

## 2016-09-12 ENCOUNTER — Other Ambulatory Visit: Payer: Self-pay | Admitting: Internal Medicine

## 2016-09-13 ENCOUNTER — Other Ambulatory Visit: Payer: Self-pay | Admitting: Radiology

## 2016-09-13 ENCOUNTER — Encounter: Payer: Self-pay | Admitting: Internal Medicine

## 2016-09-13 ENCOUNTER — Ambulatory Visit (INDEPENDENT_AMBULATORY_CARE_PROVIDER_SITE_OTHER): Payer: Federal, State, Local not specified - PPO | Admitting: Internal Medicine

## 2016-09-13 ENCOUNTER — Ambulatory Visit: Payer: Medicare Other

## 2016-09-13 ENCOUNTER — Telehealth: Payer: Self-pay | Admitting: Radiology

## 2016-09-13 VITALS — HR 67 | Temp 98.3°F | Resp 16 | Ht 62.5 in | Wt 205.0 lb

## 2016-09-13 DIAGNOSIS — R3915 Urgency of urination: Secondary | ICD-10-CM | POA: Diagnosis not present

## 2016-09-13 DIAGNOSIS — R1031 Right lower quadrant pain: Secondary | ICD-10-CM

## 2016-09-13 DIAGNOSIS — E669 Obesity, unspecified: Secondary | ICD-10-CM

## 2016-09-13 DIAGNOSIS — N3000 Acute cystitis without hematuria: Secondary | ICD-10-CM | POA: Diagnosis not present

## 2016-09-13 DIAGNOSIS — M549 Dorsalgia, unspecified: Secondary | ICD-10-CM | POA: Diagnosis not present

## 2016-09-13 DIAGNOSIS — G8929 Other chronic pain: Secondary | ICD-10-CM

## 2016-09-13 LAB — POCT URINALYSIS DIPSTICK
BILIRUBIN UA: NEGATIVE
Blood, UA: NEGATIVE
GLUCOSE UA: NEGATIVE
Nitrite, UA: NEGATIVE
Protein, UA: >=300
Spec Grav, UA: 1.025
Urobilinogen, UA: 0.2
pH, UA: 6

## 2016-09-13 MED ORDER — OXYCODONE-ACETAMINOPHEN 10-325 MG PO TABS: 1.0000 | ORAL_TABLET | Freq: Three times a day (TID) | ORAL | 0 refills | Status: DC | PRN

## 2016-09-13 MED ORDER — OSELTAMIVIR PHOSPHATE 75 MG PO CAPS: 75.0000 mg | ORAL_CAPSULE | Freq: Every day | ORAL | 0 refills | Status: DC

## 2016-09-13 NOTE — Patient Instructions (Signed)
Your urine dipstick is not overly suggestive of a UTI  I will notify you of the results of the culture in a few days when available  Your right lower quadrant pain is not suggestive of a blockage in your SMA.

## 2016-09-13 NOTE — Telephone Encounter (Signed)
Pt UA resulted with trace ketones and leukocytes. Would you like to culture?

## 2016-09-13 NOTE — Telephone Encounter (Signed)
Yrs, formal ua and culture. Thanks!

## 2016-09-13 NOTE — Progress Notes (Signed)
Subjective:  Patient ID: [name], female    DOB: 08-25-1940  Age: 76 y.o. MRN: [id number]  CC: The primary encounter diagnosis was Urgency of urination. Diagnoses of Chronic back pain greater than 3 months duration, Acute cystitis without hematuria, Abdominal pain, RLQ, and Obesity (BMI 30-39.9) were also pertinent to this visit.  HPI [name] presents for follow up on mutiple issues:  1) chronic low back pain secondary to herniated disk and failed laminectomy surgery with rod placemeent.  Her pain is managed with percocet , and her dose reduced to 2 daily in December due to concurrent use of valium for muscle spasm.  She is tolerating the reduction and ha snot had any epsiodes of oversedation .  She has not had any ER visits  And has not requested any early refills.  Her Refill history was confirmed via [place] Controlled Substance database by me today during her visit and there have been no prescriptions of controlled substances filled from any providers other than me. Marland Kitchen    2) She had an episode  Of RLQ pain relieved with a bowel movement .  She has a history of SMA stenosis  S/p stent .  Her last vasc appt was oct 2017 ; stent was patent and she had no symptoms suggestive of chronc visceral ischemia. Annual follow up was the plan.   3)Obesity: she is intentionally losing weight USING SLIM FAST SHAKES,  GUNDRY PLANT PARADOX DIET. HAS A TREADMILL AND A BOWFLEX  But has not started using them regularly for exercise.    4) she is scheduled to have ENT SURGERY MARCH 21 BY [facility] FOR 20 DAYS FOR SINUSITIS ON JAN 18TH.   SYMPTOMS  IMPROVED AND DOSING SALINE IRRIGATION WITH TOBRAMYCIN    ON [facility].  Marland Kitchen Marland Kitchen     5) HAD A UTI DEC 30TH,  SELF TREATED WITH FOSFOMYCIN.  SYMPTOMS RESOLVED BUT HAS requested confirmation of resolution.    Outpatient Medications Prior to Visit  Medication Sig Dispense Refill  . amLODipine (NORVASC) 10 MG tablet TAKE 1 TABLET (10 MG TOTAL) BY MOUTH  DAILY. 90 tablet 1  . Ascorbic Acid (VITAMIN C) 1000 MG tablet Take 500 mg by mouth daily.     . benzonatate (TESSALON) 200 MG capsule TAKE 1 CAPSULE (200 MG TOTAL) BY MOUTH 3 (THREE) TIMES DAILY AS NEEDED FOR COUGH. 60 capsule 3  . CLARINEX 5 MG tablet TAKE 1 TABLET (5 MG TOTAL) BY MOUTH EVERY MORNING. 90 tablet 3  . Coenzyme Q10 100 MG TABS Take 1 tablet by mouth daily.    Marland Kitchen COZAAR 50 MG tablet TAKE 1 TABLET BY MOUTH TWICE A DAY 180 tablet 4  . CRESTOR 20 MG tablet TAKE 1 TABLET (20 MG TOTAL) BY MOUTH DAILY. 90 tablet 1  . cyanocobalamin (,VITAMIN B-12,) 1000 MCG/ML injection FOR USE WITH MONTHLY B12 INJECTIONS 10 mL 1  . doxepin (SINEQUAN) 25 MG capsule TAKE 2 CAPSULES (50 MG TOTAL) BY MOUTH AT BEDTIME. 180 capsule 1  . escitalopram (LEXAPRO) 10 MG tablet TAKE 1 TABLET (10 MG TOTAL) BY MOUTH DAILY. 90 tablet 2  . fluticasone (FLONASE) 50 MCG/ACT nasal spray     . isosorbide mononitrate (IMDUR) 60 MG 24 hr tablet TAKE 1 TABLET (60 MG TOTAL) BY MOUTH DAILY. 90 tablet 3  . levothyroxine (SYNTHROID, LEVOTHROID) 137 MCG tablet Take 137 mcg by mouth daily before breakfast.    . LIDODERM 5 % PLACE 3 PATCHES ONTO SKIN  DAILY AND DISCARD WITH 12 HOURS OR AS DIRECTED 90 patch 5  . montelukast (SINGULAIR) 10 MG tablet Take 1 tablet (10 mg total) by mouth at bedtime. 90 tablet 1  . NEEDLE, DISP, 23 G (BD DISP NEEDLE) 23G X 1" MISC Uses to inject b12 into muscle once monthly 12 each 1  . nitroGLYCERIN (NITROSTAT) 0.4 MG SL tablet Place 1 tablet (0.4 mg total) under the tongue every 5 (five) minutes as needed for chest pain. 50 tablet 3  . omega-3 acid ethyl esters (LOVAZA) 1 g capsule TAKE 2 CAPSULES (2 G TOTAL) BY MOUTH 2 (TWO) TIMES DAILY. 360 capsule 3  . Prenatal Vit-Fe Fumarate-FA (PRENATAL 1 PLUS 1 PO) Take 1 tablet by mouth daily.    Marland Kitchen PREVIDENT 5000 DRY MOUTH 1.1 % GEL dental gel     . pyridOXINE (VITAMIN B-6) 100 MG tablet Take 100 mg by mouth daily.    . sodium chloride (OCEAN) 0.65 % nasal spray  Place 2 sprays into the nose as needed. Allergies      . SYRINGE-NEEDLE, DISP, 3 ML 25G X 1" 3 ML MISC Please dispense so patient can administer B12 injections monthly 50 each 1  . tobramycin (NEBCIN) 80 MG/2ML SOLN injection     . VALIUM 5 MG tablet TAKE 1 TABLET BY MOUTH TWICE A DAY 60 tablet 1  . Vitamin D, Ergocalciferol, (DRISDOL) 50000 units CAPS capsule TAKE 1 CAPSULE (50,000 UNITS TOTAL) BY MOUTH EVERY 7 (SEVEN) DAYS. 12 capsule 0  . oxyCODONE-acetaminophen (PERCOCET) 10-325 MG tablet Take 1 tablet by mouth every 8 (eight) hours as needed for pain. May refill on or after March 25 2016 75 tablet 0  . oxyCODONE-acetaminophen (PERCOCET) 10-325 MG tablet Take 1 tablet by mouth every 8 (eight) hours as needed for pain. May refill on or after Aug 15 2016 60 tablet 0  . phenazopyridine (PYRIDIUM) 200 MG tablet Take 1 tablet (200 mg total) by mouth 3 (three) times daily as needed for pain. NAME BRAND ONLY (Patient not taking: Reported on 09/13/2016) 10 tablet 0  . ciprofloxacin (CIPRO) 750 MG tablet Take 750 mg by mouth 2 (two) times daily.    Marland Kitchen escitalopram (LEXAPRO) 10 MG tablet Take 1 tablet (10 mg total) by mouth daily. 30 tablet 10  . OVER THE COUNTER MEDICATION Take 1 capsule by mouth daily. Florify    . SINGULAIR 10 MG tablet TAKE 1 TABLET (10 MG TOTAL) BY MOUTH AT BEDTIME. 90 tablet 1  . SYNTHROID 137 MCG tablet TAKE 1 TABLET (137 MCG TOTAL) BY MOUTH DAILY BEFORE BREAKFAST. 90 tablet 1   Facility-Administered Medications Prior to Visit  Medication Dose Route Frequency Provider Last Rate Last Dose  . chlorhexidine (HIBICLENS) 4 % liquid 4 application  60 mL Topical Once Gaynelle Arabian, MD        Review of Systems;  Patient denies headache, fevers, malaise, unintentional weight loss, skin rash, eye pain, sinus congestion and sinus pain, sore throat, dysphagia,  hemoptysis , cough, dyspnea, wheezing, chest pain, palpitations, orthopnea, edema, abdominal pain, nausea, melena, diarrhea,  constipation, flank pain, dysuria, hematuria, urinary  Frequency, nocturia, numbness, tingling, seizures,  Focal weakness, Loss of consciousness,  Tremor, insomnia, depression, anxiety, and suicidal ideation.      Objective:  Pulse 67   Temp 98.3 F (36.8 C) (Oral)   Resp 16   Ht 5' 2.5" (1.588 m)   Wt 205 lb (93 kg)   SpO2 97%   BMI 36.90 kg/m  BP Readings from Last 3 Encounters:  06/20/16 (!) 177/81  06/12/16 124/64  04/08/16 (!) 153/61    Wt Readings from Last 3 Encounters:  09/13/16 205 lb (93 kg)  06/20/16 213 lb (96.6 kg)  06/12/16 213 lb 12 oz (97 kg)    General appearance: alert, cooperative and appears stated age Ears: normal TM's and external ear canals both ears Throat: lips, mucosa, and tongue normal; teeth and gums normal Neck: no adenopathy, no carotid bruit, supple, symmetrical, trachea midline and thyroid not enlarged, symmetric, no tenderness/mass/nodules Back: symmetric, no curvature. ROM normal. No CVA tenderness. Lungs: clear to auscultation bilaterally Heart: regular rate and rhythm, S1, S2 normal, no murmur, click, rub or gallop Abdomen: soft, non-tender; bowel sounds normal; no masses,  no organomegaly Pulses: 2+ and symmetric Skin: Skin color, texture, turgor normal. No rashes or lesions Lymph nodes: Cervical, supraclavicular, and axillary nodes normal.  Lab Results  Component Value Date   HGBA1C 5.3 06/12/2016   HGBA1C 4.5 (L) 04/04/2013   HGBA1C 5.8 04/18/2011    Lab Results  Component Value Date   CREATININE 1.16 08/07/2016   CREATININE 1.13 06/12/2016   CREATININE 1.54 (H) 04/08/2016    Lab Results  Component Value Date   WBC 8.7 04/08/2016   HGB 13.8 04/08/2016   HCT 39.9 04/08/2016   PLT 156 04/08/2016   GLUCOSE 102 (H) 08/07/2016   CHOL 136 08/07/2016   TRIG 152.0 (H) 08/07/2016   HDL 48.70 08/07/2016   LDLDIRECT 64.7 01/01/2012   LDLCALC 57 08/07/2016   ALT 17 06/12/2016   AST 20 06/12/2016   NA 143 08/07/2016   K  4.1 08/07/2016   CL 109 08/07/2016   CREATININE 1.16 08/07/2016   BUN 30 (H) 08/07/2016   CO2 25 08/07/2016   TSH 0.36 06/12/2016   INR 1.03 11/28/2013   HGBA1C 5.3 06/12/2016   MICROALBUR 16.6 (H) 06/12/2016    No results found.  Assessment & Plan:   Problem List Items Addressed This Visit    Abdominal pain, RLQ    Reassured patient that her recent episodes of abdominal pain that resolved with bowel movement  Are not indicative of progression of her SMA stenosis.       Chronic back pain greater than 3 months duration    Discussed continued  weaning her from chronic daily use of percocet given the normal myelogram and CT that were done last October.  She will return in 3 months . Refills given .        Relevant Medications   oxyCODONE-acetaminophen (PERCOCET) 10-325 MG tablet   Obesity (BMI 30-39.9)    I have congratulated her in reduction of   BMI and encouraged  Continued weight loss with goal of 10% of body weigh over the next 6 months using a low glycemic index diet and regular exercise a minimum of 5 days per week.        UTI (urinary tract infection)    Repeat samples are not suggestive of infection       Relevant Medications   oseltamivir (TAMIFLU) 75 MG capsule    Other Visit Diagnoses    Urgency of urination    -  Primary   Relevant Orders   POCT Urinalysis Dipstick (Completed)      I have discontinued Ms. Huhta's OVER THE COUNTER MEDICATION, SYNTHROID, ciprofloxacin, oxyCODONE-acetaminophen, oxyCODONE-acetaminophen, SINGULAIR, and oxyCODONE-acetaminophen. I have also changed her oxyCODONE-acetaminophen. Additionally, I am having her start on oseltamivir. Lastly, I am having her maintain  her sodium chloride, NEEDLE (DISP) 23 G, vitamin C, Prenatal Vit-Fe Fumarate-FA (PRENATAL 1 PLUS 1 PO), pyridOXINE, Coenzyme Q10, cyanocobalamin, fluticasone, PREVIDENT 5000 DRY MOUTH, tobramycin, montelukast, SYRINGE-NEEDLE (DISP) 3 ML, nitroGLYCERIN, LIDODERM, amLODipine,  CLARINEX, escitalopram, Vitamin D (Ergocalciferol), VALIUM, doxepin, levothyroxine, CRESTOR, COZAAR, omega-3 acid ethyl esters, phenazopyridine, isosorbide mononitrate, and benzonatate.  Meds ordered this encounter  Medications  . oseltamivir (TAMIFLU) 75 MG capsule    Sig: Take 1 capsule (75 mg total) by mouth daily.    Dispense:  10 capsule    Refill:  0  . DISCONTD: oxyCODONE-acetaminophen (PERCOCET) 10-325 MG tablet    Sig: Take 1 tablet by mouth every 8 (eight) hours as needed for pain. May refill on or after Sep 15 2016    Dispense:  60 tablet    Refill:  0  . DISCONTD: oxyCODONE-acetaminophen (PERCOCET) 10-325 MG tablet    Sig: Take 1 tablet by mouth every 8 (eight) hours as needed for pain. May refill on or after Oct 15 2016    Dispense:  60 tablet    Refill:  0  . oxyCODONE-acetaminophen (PERCOCET) 10-325 MG tablet    Sig: Take 1 tablet by mouth every 8 (eight) hours as needed for pain. May refill on or after November 13 2016    Dispense:  60 tablet    Refill:  0    Medications Discontinued During This Encounter  Medication Reason  . ciprofloxacin (CIPRO) 750 MG tablet Completed Course  . escitalopram (LEXAPRO) 10 MG tablet Duplicate  . OVER THE COUNTER MEDICATION Patient Discharge  . SINGULAIR 10 MG tablet Duplicate  . SYNTHROID 0000000 MCG tablet Duplicate  . oxyCODONE-acetaminophen (PERCOCET) 10-325 MG tablet   . oxyCODONE-acetaminophen (PERCOCET) 10-325 MG tablet Reorder  . oxyCODONE-acetaminophen (PERCOCET) 10-325 MG tablet Reorder  . oxyCODONE-acetaminophen (PERCOCET) 10-325 MG tablet Reorder    Follow-up: Return in about 3 months (around 12/12/2016), or MID APRIL ,  REFILLS .   Crecencio Mc, MD

## 2016-09-13 NOTE — Telephone Encounter (Signed)
Pt last seen 06/12/16. Medication last refilled 06/02/16. Please advise?

## 2016-09-15 ENCOUNTER — Encounter: Payer: Self-pay | Admitting: Internal Medicine

## 2016-09-15 LAB — CULTURE, URINE COMPREHENSIVE: Organism ID, Bacteria: NO GROWTH

## 2016-09-16 DIAGNOSIS — R1031 Right lower quadrant pain: Secondary | ICD-10-CM | POA: Insufficient documentation

## 2016-09-16 NOTE — Assessment & Plan Note (Signed)
Repeat samples are not suggestive of infection

## 2016-09-16 NOTE — Assessment & Plan Note (Signed)
Reassured patient that her recent episodes of abdominal pain that resolved with bowel movement  Are not indicative of progression of her SMA stenosis.

## 2016-09-16 NOTE — Assessment & Plan Note (Signed)
I have congratulated her in reduction of   BMI and encouraged  Continued weight loss with goal of 10% of body weigh over the next 6 months using a low glycemic index diet and regular exercise a minimum of 5 days per week.    

## 2016-09-16 NOTE — Assessment & Plan Note (Signed)
Discussed continued  weaning her from chronic daily use of percocet given the normal myelogram and CT that were done last October.  She will return in 3 months . Refills given .

## 2016-09-18 ENCOUNTER — Other Ambulatory Visit: Payer: Self-pay | Admitting: Internal Medicine

## 2016-09-18 NOTE — Telephone Encounter (Signed)
No longer needed.  Do not refill. Should be taking 2000 IUs daily

## 2016-09-18 NOTE — Telephone Encounter (Signed)
Pt last refill of Vitamin D 50000 units was on 05/30/16. Pt last Vit D labs were on 06/12/16. Last OV was 09/13/16.

## 2016-09-18 NOTE — Telephone Encounter (Signed)
Pt needs a refill on VALIUM 5 MG tablet  Sent to [place].. Please advise

## 2016-09-19 NOTE — Telephone Encounter (Signed)
Last OV with you was 09/13/16. Next OV 12/12/16. Last filled # 60 06/02/2016.   See pended refill.

## 2016-09-20 ENCOUNTER — Other Ambulatory Visit: Payer: Self-pay | Admitting: Internal Medicine

## 2016-09-20 ENCOUNTER — Ambulatory Visit: Payer: Federal, State, Local not specified - PPO

## 2016-09-20 MED ORDER — DIAZEPAM 5 MG PO TABS: 5.0000 mg | ORAL_TABLET | Freq: Two times a day (BID) | ORAL | 3 refills | Status: DC

## 2016-09-20 MED ORDER — DIAZEPAM 5 MG PO TABS: 5.0000 mg | ORAL_TABLET | Freq: Every day | ORAL | 3 refills | Status: DC | PRN

## 2016-09-20 NOTE — Telephone Encounter (Signed)
REFILLED BUT QTY CHANGED TO #30/MONTH  GIVEN CONCURRENT USE OF NARCOTICS

## 2016-09-21 NOTE — Telephone Encounter (Signed)
Rx was faxed to pharmacy on 09/20/16.

## 2016-09-26 ENCOUNTER — Other Ambulatory Visit: Payer: Self-pay | Admitting: Internal Medicine

## 2016-09-27 ENCOUNTER — Telehealth: Payer: Self-pay | Admitting: Internal Medicine

## 2016-09-27 NOTE — Telephone Encounter (Signed)
Pt called and stated that her spouse was dx'd with the flu again. Would Dr. Derrel Nip wanted her to start another round of Tamiflu. Please advise, thank you!  Call pt @ [phone]

## 2016-09-27 NOTE — Telephone Encounter (Signed)
No she just had it January 24th so she should have just recently finished it .  She should avoid contact with him, etc

## 2016-09-28 NOTE — Telephone Encounter (Signed)
Patient advised of below and verbalized understanding.  

## 2016-09-29 ENCOUNTER — Other Ambulatory Visit: Payer: Self-pay | Admitting: Internal Medicine

## 2016-10-27 ENCOUNTER — Other Ambulatory Visit (INDEPENDENT_AMBULATORY_CARE_PROVIDER_SITE_OTHER): Payer: Federal, State, Local not specified - PPO

## 2016-10-27 ENCOUNTER — Other Ambulatory Visit: Payer: Self-pay | Admitting: Internal Medicine

## 2016-10-27 ENCOUNTER — Telehealth: Payer: Self-pay | Admitting: Internal Medicine

## 2016-10-27 DIAGNOSIS — R300 Dysuria: Secondary | ICD-10-CM

## 2016-10-27 LAB — POCT URINALYSIS DIPSTICK
BILIRUBIN UA: NEGATIVE
Glucose, UA: NEGATIVE
Ketones, UA: NEGATIVE
Nitrite, UA: NEGATIVE
Protein, UA: 300
SPEC GRAV UA: 1.01
UROBILINOGEN UA: 0.2
pH, UA: 5

## 2016-10-27 LAB — URINALYSIS, MICROSCOPIC ONLY

## 2016-10-27 MED ORDER — FOSFOMYCIN TROMETHAMINE 3 G PO PACK: 3.0000 g | PACK | Freq: Once | ORAL | 0 refills | Status: AC

## 2016-10-27 NOTE — Telephone Encounter (Signed)
Faxed script to [facility] in [place] as requested.

## 2016-10-27 NOTE — Telephone Encounter (Signed)
[facility] in [place] is the Pharmacy [phone]

## 2016-10-27 NOTE — Telephone Encounter (Signed)
Since the last one was negative,  I will only treat if the dipstick and micro are suggestive of UTI.  If she comes this morning I will have them both back by the end of the day

## 2016-10-27 NOTE — Telephone Encounter (Signed)
Pt called and stated that she is having issues urinating. She is coming by and dropping off a urine sample. Pt is going out of town for a wedding and would like to know if Dr. Derrel Nip could send something in for her. Please advise, thank you!  Call pt @ [phone] or [phone]

## 2016-10-27 NOTE — Telephone Encounter (Signed)
Patient going to call back with name of pharmacy and fax number.

## 2016-10-27 NOTE — Telephone Encounter (Signed)
Patient states she is having burning and frequent urination  put in dip, UA and culture . Patient asking to be treated due to leaving town for the weekend. Patient culture 09/12/16 showed no growth. Patient called stating she was bring Urine in>

## 2016-10-27 NOTE — Telephone Encounter (Signed)
Patient has been advised and will give specimen this morning.

## 2016-10-27 NOTE — Telephone Encounter (Signed)
rx for one time dose of fosfomycin

## 2016-10-30 LAB — URINE CULTURE

## 2016-11-01 ENCOUNTER — Ambulatory Visit
Admission: RE | Admit: 2016-11-01 | Discharge: 2016-11-01 | Disposition: A | Discharge status: Home / Self Care | Payer: Federal, State, Local not specified - PPO | Source: Ambulatory Visit | Attending: Internal Medicine | Admitting: Internal Medicine

## 2016-11-01 DIAGNOSIS — Z1239 Encounter for other screening for malignant neoplasm of breast: Secondary | ICD-10-CM

## 2016-11-01 DIAGNOSIS — M818 Other osteoporosis without current pathological fracture: Secondary | ICD-10-CM | POA: Insufficient documentation

## 2016-11-01 DIAGNOSIS — Z780 Asymptomatic menopausal state: Secondary | ICD-10-CM | POA: Insufficient documentation

## 2016-11-01 DIAGNOSIS — Z1231 Encounter for screening mammogram for malignant neoplasm of breast: Secondary | ICD-10-CM | POA: Insufficient documentation

## 2016-11-02 NOTE — Telephone Encounter (Signed)
Patient still having burning with urination, and frequency patient was prescribed Fosfomycin on 10/27/16, patient requesting culture results.

## 2016-11-02 NOTE — Telephone Encounter (Signed)
Patient called wanting her results from her urine culture. She stated she is still having symptoms of a UTI.

## 2016-11-05 ENCOUNTER — Encounter: Payer: Self-pay | Admitting: Internal Medicine

## 2016-11-06 ENCOUNTER — Ambulatory Visit: Payer: Federal, State, Local not specified - PPO | Admitting: Family Medicine

## 2016-11-06 ENCOUNTER — Telehealth: Payer: Self-pay | Admitting: Internal Medicine

## 2016-11-06 DIAGNOSIS — N390 Urinary tract infection, site not specified: Secondary | ICD-10-CM

## 2016-11-06 NOTE — Telephone Encounter (Signed)
Spoke with pt and she stated that she has had recurrent UTIs and would like to see a nephrologist for that. The pt stated that she went to [facility] on Saturday and they gave her 1gm of rochepin and prescribed her nitrofuratonin. The pt is wanting to know if that is ok for her to take. She states that believes the medication is working for her.

## 2016-11-06 NOTE — Telephone Encounter (Signed)
Pt called back stating she also needs a referral to see the nephrologist. Thank you!

## 2016-11-06 NOTE — Telephone Encounter (Signed)
Please advise 

## 2016-11-06 NOTE — Telephone Encounter (Signed)
The UTI that I treated her for will NOT respond to macrobid.   What is the nephrology referral for? I need a reason

## 2016-11-06 NOTE — Telephone Encounter (Signed)
fyi

## 2016-11-06 NOTE — Telephone Encounter (Signed)
Pt called and cancelled her appt today with Dr. Romona Curls. Pt went to [facility] over the weekend. Pt states that she had a raging UTI and Nephroritis. They gave her a shot along with microbid for 5 days. If you have any further questions to call her.   Call pt @ [phone]

## 2016-11-06 NOTE — Telephone Encounter (Signed)
Patient of Dr.Tullo 

## 2016-11-06 NOTE — Telephone Encounter (Signed)
A referral to UROLOGY, not NEPHROLOGY,  IS NEEDED for evaluation of recurrent UTIS.  Referral is placed.  I cannot comment on whether the current choice of antibiotics is right since I did not see her or treat her this time for this UTI,  But if she is feeling better it must be the right one .  Please remind her that I treated her as a courtesy since she was going out of town and did not have time to be seen.  She is welcome to make an appointment

## 2016-11-07 NOTE — Telephone Encounter (Signed)
Spoke with pt and she said "thank you for placing the referral, because I am tired of hurting from a UTI all the time". Pt stated she has an appt with you on April 24th and she would just keep that appt unless she needs to be seen sooner.

## 2016-11-07 NOTE — Telephone Encounter (Signed)
Attempted to call the pt. Number is busy. Will try again later.

## 2016-11-20 NOTE — Telephone Encounter (Signed)
Mailed unread message to patient.  

## 2016-11-27 ENCOUNTER — Ambulatory Visit: Payer: Medicare Other | Admitting: Urology

## 2016-11-29 ENCOUNTER — Other Ambulatory Visit: Payer: Self-pay | Admitting: Internal Medicine

## 2016-12-03 NOTE — Progress Notes (Signed)
12/04/2016 2:43 PM   [place] 05/13/1941 [id number]  Referring provider: Crecencio Mc, MD [name], [name] [address]  Chief Complaint  Patient presents with  . New Patient (Initial Visit)    recurrent uti referred by Dr. Derrel Lewis    HPI: Patient is a 76 -year-old Caucasian female who is referred to [place] by, Dr. Derrel Lewis, for recurrent urinary tract infections.  Patient states that she has had seven urinary tract infections over the last year.  Her symptoms with a urinary tract infection consist of dysuria, chills, fever and left flank pain.  She denies gross hematuria, suprapubic pain, back pain and abdominal pain.    She does not have a history of nephrolithiasis or GU trauma.  She did have a cystocele repair several years ago.    Reviewing her records,  she has had 3 positive urine cultures over the last year and four positive urine cultures over the prior year    She is not sexually active.  She is post menopausal.   She denies constipation and/or diarrhea.   She does/does not engage in good perineal hygiene. She does not take tub baths.  She has incontinence.  She is using incontinence pads, she wears one pad during the day and two pads at night.    She is not having pain with bladder filling.    She had a contrast study performed in 2016 which noted a nonobstructing calculus inferior pole LEFT kidney 3 mm diameter.  11 mm diameter nodule inferior pole RIGHT kidney, attenuation similar on post contrast and delayed images, question cyst.  Minimally prominent extrarenal pelvis RIGHT kidney with dilatation of RIGHT ureter though no definite ureteral calcification identified.  I have personally reviewed the films.  She is drinking 64 ounces daily of water daily.  One cup of coffee and one cup of tea a day.   She is taking probiotics, cranberry capsules and Vitamin C.    PMH: Past Medical History:  Diagnosis Date  . Asthma 06/2010   depending on what  around  . Blood transfusion 2009   back surgery  . Brain aneurysm    S/P RESECTION  . CHF (congestive heart failure) ([place]) 05/2010   after receiving Propofol  . Chronic sinusitis   . Complication of anesthesia 05/30/2010   from colonoscopy-had Propofol-had a  . Complication of anesthesia    reaction-went into asthma-pneumonia,-  . Complication of anesthesia    then CHF and to ICU  . Degenerative disc disease    HERNITAED DISC BY MRI ON 07/07;S/P LAMINECTOMY,RID REPLACEMENT  . Discoid lupus erythematosus eyelid    NEGATIVE SEROLOGIC MARKERS  . Dysrhythmia    PVC's occ.  Marland Kitchen Headache(784.0)    migraines occ.  Marland Kitchen Hypercholesterolemia   . Hypertension   . Hypothyroidism    takes Synthroid  . Pneumonia 05/30/2010   after receiving Propofol  . Renal insufficiency    Patient states she has been told she has Stage I kidney disease  . Sleep apnea    uses CPAP-8.4-oxygen suppliment  . Systemic lupus erythematosus ([place]) 05/15/2012    Surgical History: Past Surgical History:  Procedure Laterality Date  . ABDOMINAL HYSTERECTOMY  1988   uterus only  . BACK SURGERY  1968,1988,2002,2009,2010   4 laminectomies and fusions  . BRAIN SURGERY  2005   coiling of cranial aneurysum  . BREAST EXCISIONAL BIOPSY Right 30+ YRS AGO   NEG  . COLONOSCOPY  2008  .  COLONOSCOPY WITH PROPOFOL N/A 01/13/2015   Procedure: COLONOSCOPY WITH PROPOFOL;  Surgeon: Kristi Lye, MD;  Location: [facility] ENDOSCOPY;  Service: Endoscopy;  Laterality: N/A;  . COLONOSCOPY WITH PROPOFOL N/A 03/24/2015   Procedure: COLONOSCOPY WITH PROPOFOL;  Surgeon: Kristi Lye, MD;  Location: [facility] ENDOSCOPY;  Service: Endoscopy;  Laterality: N/A;  . COSMETIC SURGERY    . [place]   left foot  . EYE SURGERY     both cataracts -[place] 7/14  . FOOT ARTHRODESIS Right 05/22/2013   Procedure: RIGHT HALLUX METATARSAL PHALANGEAL JOINT ARTHRODESIS ;  Surgeon: Kristi Simmer, MD;  Location: [facility];  Service: Orthopedics;  Laterality: Right;  . JOINT REPLACEMENT  2013   Right knee,  [name]  . lipomas  1975   3 from right breast  . NASAL SINUS SURGERY  1977-2008   6 surgeries all together  . PERIPHERAL VASCULAR CATHETERIZATION N/A 01/25/2015   Procedure: Visceral Angiography;  Surgeon: Kristi Huxley, MD;  Location: [facility];  Service: Cardiovascular;  Laterality: N/A;  . RIGHT HEART CATHETERIZATION N/A 11/28/2013   Procedure: RIGHT HEART CATH;  Surgeon: Kristi Artist, MD;  Location: [facility] CATH LAB;  Service: Cardiovascular;  Laterality: N/A;  . TONSILLECTOMY  1963  . TOTAL KNEE ARTHROPLASTY  07/10/2011   Procedure: TOTAL KNEE ARTHROPLASTY;  Surgeon: [name];  Location: [facility];  Service: Orthopedics;  Laterality: Right;  . TYMPANIC MEMBRANE REPAIR  2009-last one   x5 times    Home Medications:  Allergies as of 12/04/2016      Reactions   Penicillins Anaphylaxis   Septra ANTI-INFECTIVE AGENTS-MISC..WELTS,ITCHING   Penicillins Hives   Propofol Other (See Comments)   O2 drops    Propranolol    chf   Triamcinolone Acetonide Hives   Adhesive [tape] Rash   Kenalog [triamcinolone Acetonide] Itching, Rash   Septra [bactrim] Swelling, Rash   Sulfur Rash      Medication List       Accurate as of 12/04/16 11:59 PM. Always use your most recent med list.          benzonatate 200 MG capsule Commonly known as:  TESSALON TAKE 1 CAPSULE (200 MG TOTAL) BY MOUTH 3 (THREE) TIMES DAILY AS NEEDED FOR COUGH.   CLARINEX 5 MG tablet Generic drug:  desloratadine TAKE 1 TABLET (5 MG TOTAL) BY MOUTH EVERY MORNING.   Coenzyme Q10 100 MG Tabs Take 1 tablet by mouth daily.   COZAAR 50 MG tablet Generic drug:  losartan TAKE 1 TABLET BY MOUTH TWICE A DAY   CRESTOR 20 MG tablet Generic drug:  rosuvastatin TAKE 1 TABLET (20 MG TOTAL) BY MOUTH DAILY.   cyanocobalamin 1000 MCG/ML injection Commonly known as:  (VITAMIN B-12) FOR USE WITH MONTHLY B12 INJECTIONS     diazepam 5 MG tablet Commonly known as:  VALIUM Take 1 tablet (5 mg total) by mouth daily as needed for anxiety.   doxepin 25 MG capsule Commonly known as:  SINEQUAN TAKE 2 CAPSULES (50 MG TOTAL) BY MOUTH AT BEDTIME.   escitalopram 10 MG tablet Commonly known as:  LEXAPRO TAKE 1 TABLET (10 MG TOTAL) BY MOUTH DAILY.   fluticasone 50 MCG/ACT nasal spray Commonly known as:  FLONASE   isosorbide mononitrate 60 MG 24 hr tablet Commonly known as:  IMDUR TAKE 1 TABLET (60 MG TOTAL) BY MOUTH DAILY.   levothyroxine 137 MCG tablet Commonly known as:  SYNTHROID, LEVOTHROID Take 137 mcg by  mouth daily before breakfast.   SYNTHROID 137 MCG tablet Generic drug:  levothyroxine TAKE 1 TABLET (137 MCG TOTAL) BY MOUTH DAILY BEFORE BREAKFAST.   LIDODERM 5 % Generic drug:  lidocaine PLACE 3 PATCHES ONTO SKIN DAILY AND DISCARD WITH 12 HOURS OR AS DIRECTED   montelukast 10 MG tablet Commonly known as:  SINGULAIR Take 1 tablet (10 mg total) by mouth at bedtime.   MULTI-VITAMINS Tabs Take 1 tablet by mouth daily.   NEEDLE (DISP) 23 G 23G X 1" Misc Commonly known as:  BD DISP NEEDLE Uses to inject b12 into muscle once monthly   nitroGLYCERIN 0.4 MG SL tablet Commonly known as:  NITROSTAT Place 1 tablet (0.4 mg total) under the tongue every 5 (five) minutes as needed for chest pain.   NORVASC 10 MG tablet Generic drug:  amLODipine TAKE 1 TABLET (10 MG TOTAL) BY MOUTH DAILY.   omega-3 acid ethyl esters 1 g capsule Commonly known as:  LOVAZA TAKE 2 CAPSULES (2 G TOTAL) BY MOUTH 2 (TWO) TIMES DAILY.   oseltamivir 75 MG capsule Commonly known as:  TAMIFLU Take 1 capsule (75 mg total) by mouth daily.   oxyCODONE-acetaminophen 10-325 MG tablet Commonly known as:  PERCOCET Take 1 tablet by mouth every 8 (eight) hours as needed for pain. May refill on or after November 13 2016   phenazopyridine 200 MG tablet Commonly known as:  PYRIDIUM Take 1 tablet (200 mg total) by mouth 3 (three)  times daily as needed for pain. NAME BRAND ONLY   PRENATAL 1 PLUS 1 PO Take 1 tablet by mouth daily.   PREVIDENT 5000 DRY MOUTH 1.1 % Gel dental gel Generic drug:  sodium fluoride   pyridOXINE 100 MG tablet Commonly known as:  VITAMIN B-6 Take 100 mg by mouth daily.   sodium chloride 0.65 % nasal spray Commonly known as:  OCEAN Place 2 sprays into the nose as needed. Allergies   SYRINGE-NEEDLE (DISP) 3 ML 25G X 1" 3 ML Misc Please dispense so patient can administer B12 injections monthly   tobramycin 80 MG/2ML Soln injection Commonly known as:  NEBCIN   vitamin C 1000 MG tablet Take 500 mg by mouth daily.   Vitamin D (Ergocalciferol) 50000 units Caps capsule Commonly known as:  DRISDOL TAKE 1 CAPSULE (50,000 UNITS TOTAL) BY MOUTH EVERY 7 (SEVEN) DAYS.   VOLTAREN 1 % Gel Generic drug:  diclofenac sodium APPLY 4 GRAMS TWICE A DAY       Allergies:  Allergies  Allergen Reactions  . Penicillins Anaphylaxis    Septra ANTI-INFECTIVE AGENTS-MISC..WELTS,ITCHING  . Penicillins Hives  . Propofol Other (See Comments)    O2 drops   . Propranolol     chf  . Triamcinolone Acetonide Hives  . Adhesive [Tape] Rash  . Kenalog [Triamcinolone Acetonide] Itching and Rash  . Septra [Bactrim] Swelling and Rash  . Sulfur Rash    Family History: Family History  Problem Relation Age of Onset  . Heart disease Mother   . Stroke Mother   . Alcohol abuse Other   . Cancer Sister     leukemia  . Cancer Brother     lung Ca, asbestos  . Breast cancer Paternal Aunt   . Prostate cancer Neg Hx   . Kidney cancer Neg Hx   . Bladder Cancer Neg Hx     Social History:  reports that she quit smoking about 3 years ago. Her smoking use included Cigarettes. She has a 20.00 pack-year smoking history.  She has never used smokeless tobacco. She reports that she drinks alcohol. She reports that she does not use drugs.  ROS: UROLOGY Frequent Urination?: Yes Hard to postpone urination?:  No Burning/pain with urination?: No Get up at night to urinate?: Yes Leakage of urine?: Yes Urine stream starts and stops?: Yes Trouble starting stream?: No Do you have to strain to urinate?: No Blood in urine?: Yes Urinary tract infection?: Yes Sexually transmitted disease?: No Injury to kidneys or bladder?: Yes Painful intercourse?: No Weak stream?: No Currently pregnant?: No Vaginal bleeding?: No Last menstrual period?: n  Gastrointestinal Nausea?: No Vomiting?: No Indigestion/heartburn?: No Diarrhea?: No Constipation?: Yes  Constitutional Fever: No Night sweats?: No Weight loss?: Yes Fatigue?: No  Skin Skin rash/lesions?: Yes Itching?: No  Eyes Blurred vision?: No Double vision?: No  Ears/Nose/Throat Sore throat?: No Sinus problems?: No  Hematologic/Lymphatic Swollen glands?: No Easy bruising?: No  Cardiovascular Leg swelling?: Yes Chest pain?: No  Respiratory Cough?: Yes Shortness of breath?: No  Endocrine Excessive thirst?: No  Musculoskeletal Back pain?: Yes Joint pain?: Yes  Neurological Headaches?: No Dizziness?: Yes  Psychologic Depression?: No Anxiety?: No  Physical Exam: BP (!) 148/79   Pulse 74   Ht 5' 2.5" (1.588 m)   Wt 203 lb 12.8 oz (92.4 kg)   BMI 36.68 kg/m   Constitutional: Well nourished. Alert and oriented, No acute distress. HEENT: [place] AT, moist mucus membranes. Trachea midline, no masses. Cardiovascular: No clubbing, cyanosis, or edema. Respiratory: Normal respiratory effort, no increased work of breathing. GI: Abdomen is soft, non tender, non distended, no abdominal masses. Liver and spleen not palpable.  No hernias appreciated.  Stool sample for occult testing is not indicated.   GU: No CVA tenderness.  No bladder fullness or masses.  Atrophic external genitalia, normal pubic hair distribution, no lesions.  Normal urethral meatus, no lesions, no prolapse, no discharge.   No urethral masses, tenderness and/or  tenderness. No bladder fullness, tenderness or masses. Pale vagina mucosa, poor estrogen effect, no discharge, no lesions, good pelvic support, no cystocele or rectocele noted.  Cervix and uterus are surgically absent.  No adnexa/parametria masses are palpated.  Anus and perineum are without rashes or lesions.    Skin: No rashes, bruises or suspicious lesions. Lymph: No cervical or inguinal adenopathy. Neurologic: Grossly intact, no focal deficits, moving all 4 extremities. Psychiatric: Normal mood and affect.  Laboratory Data: Lab Results  Component Value Date   WBC 8.7 04/08/2016   HGB 13.8 04/08/2016   HCT 39.9 04/08/2016   MCV 95.7 04/08/2016   PLT 156 04/08/2016    Lab Results  Component Value Date   CREATININE 1.16 08/07/2016     Lab Results  Component Value Date   HGBA1C 5.3 06/12/2016    Lab Results  Component Value Date   TSH 0.36 06/12/2016       Component Value Date/Time   CHOL 136 08/07/2016 1052   HDL 48.70 08/07/2016 1052   CHOLHDL 3 08/07/2016 1052   VLDL 30.4 08/07/2016 1052   LDLCALC 57 08/07/2016 1052    Lab Results  Component Value Date   AST 20 06/12/2016   Lab Results  Component Value Date   ALT 17 06/12/2016     Pertinent Imaging: CLINICAL DATA:  RIGHT lower quadrant pain for 4 days, fever, nausea beginning today, prior lumbar spine surgeries, clinically concern for appendicitis  EXAM: CT ABDOMEN AND PELVIS WITH CONTRAST  TECHNIQUE: Multidetector CT imaging of the abdomen and pelvis was performed  using the standard protocol following bolus administration of intravenous contrast. Sagittal and coronal MPR images reconstructed from axial data set.  CONTRAST:  7m OMNIPAQUE IOHEXOL 300 MG/ML SOLN IV. Dilute oral contrast.  COMPARISON:  None.  FINDINGS: Subsegmental atelectasis BILATERAL lower lobes.  Cyst posterior RIGHT lobe liver 2.2 x 1.4 cm image 16.  Scattered atherosclerotic calcifications aorta and  coronary arteries.  Liver, spleen, pancreas, and adrenal glands normal.  Nonobstructing calculus inferior pole LEFT kidney 3 mm diameter.  11 mm diameter nodule inferior pole RIGHT kidney, attenuation similar on post contrast and delayed images, question cyst.  Minimally prominent extrarenal pelvis RIGHT kidney with dilatation of RIGHT ureter though no definite ureteral calcification identified.  Normal appendix.  Focal wall thickening ascending colon laterally concerning for a colonic neoplasm, approximately 4.1 cm length.  Minimal distal colonic diverticulosis.  Stomach and remaining bowel loops normal appearance.  No mass, adenopathy, free fluid, or free air.  Prior L2-L5 posterior fusion.  Diffuse osseous demineralization.  IMPRESSION: Mild dilatation of RIGHT ureter and minimally prominent RIGHT renal pelvis though no definite RIGHT ureteral calcification is identified; cannot exclude passed RIGHT ureteral stone.  Tiny nonobstructing LEFT renal calculus at inferior pole.  Segmental wall thickening of ascending colon over 4.1 cm length highly concerning for colonic neoplasm; followup colonoscopy recommended to assess.  Findings called to Dr. TDerrel Nipon 01/04/2015 at 2131 hours.   Electronically Signed   By: MLavonia DanaM.D.   On: 01/04/2015 21:33  Assessment & Plan:    1. Recurrent UTI's  - criteria for recurrent UTI has been met with 2 or more infections in 6 months or 3 or greater infections in one year   - Patient is to continue water intake until the urine is pale yellow or clear (10 to 12 cups daily)   - Patient is to continue  probiotics (yogurt, oral pills or vaginal suppositories), take cranberry pills or drink the juice and Vitamin C 1,000 mg daily to acidify the urine should be added to their daily regimen   - Reminded to avoid soaking in tubs and wipe front to back after urinating   - advised them to have CATH UA's for urinalysis and  culture to prevent skin contamination of the specimen  - reviewed symptoms of UTI and advised not to have urine checked or be treated for UTI if not experiencing symptoms  - discussed antibiotic stewardship with the patient    2. Vaginal atrophy  - I explained to the patient that when women go through menopause and her estrogen levels are severely diminished, the normal vaginal flora will change.  This is due to an increase of the vaginal canal's pH. Because of this, the vaginal canal may be colonized by bacteria from the rectum instead of the protective lactobacillus.  This accompanied by the loss of the mucus barrier with vaginal atrophy is a cause of recurrent urinary tract infections.  - In some studies, the use of vaginal estrogen cream has been demonstrated to reduce  recurrent urinary tract infections to one a year.   - Patient was given a sample of vaginal estrogen cream (Premarin) and instructed to apply 0.557m(pea-sized amount)  just inside the vaginal introitus with a finger-tip every night for two weeks and then Monday, Wednesday and Friday nights.  I explained to the patient that vaginally administered estrogen, which causes only a slight increase in the blood estrogen levels, have fewer contraindications and adverse systemic effects that oral HT. She will  follow up in two weeks for an exam.    3. History of nephrolithiasis  - small stone in left kidney seen on 2016 CT scan  - obtain a RUS to rule out stone as a nidus for infection                                Return in about 2 weeks (around 12/18/2016) for exam.  These notes generated with voice recognition software. I apologize for typographical errors.  Zara Council, [facility] [address], [place], [place] [address] [phone]

## 2016-12-04 ENCOUNTER — Other Ambulatory Visit: Payer: Self-pay | Admitting: Internal Medicine

## 2016-12-04 ENCOUNTER — Ambulatory Visit (INDEPENDENT_AMBULATORY_CARE_PROVIDER_SITE_OTHER): Payer: Federal, State, Local not specified - PPO | Admitting: Urology

## 2016-12-04 ENCOUNTER — Encounter: Payer: Self-pay | Admitting: Urology

## 2016-12-04 VITALS — BP 148/79 | HR 74 | Ht 62.5 in | Wt 203.8 lb

## 2016-12-04 DIAGNOSIS — Z87442 Personal history of urinary calculi: Secondary | ICD-10-CM

## 2016-12-04 DIAGNOSIS — N952 Postmenopausal atrophic vaginitis: Secondary | ICD-10-CM | POA: Diagnosis not present

## 2016-12-04 DIAGNOSIS — N390 Urinary tract infection, site not specified: Secondary | ICD-10-CM

## 2016-12-04 LAB — BLADDER SCAN AMB NON-IMAGING: SCAN RESULT: 52

## 2016-12-12 ENCOUNTER — Ambulatory Visit (INDEPENDENT_AMBULATORY_CARE_PROVIDER_SITE_OTHER): Payer: Federal, State, Local not specified - PPO | Admitting: Internal Medicine

## 2016-12-12 ENCOUNTER — Encounter: Payer: Self-pay | Admitting: Internal Medicine

## 2016-12-12 VITALS — BP 130/60 | HR 64 | Temp 98.2°F | Resp 16 | Ht 62.5 in | Wt 203.6 lb

## 2016-12-12 DIAGNOSIS — E210 Primary hyperparathyroidism: Secondary | ICD-10-CM

## 2016-12-12 DIAGNOSIS — M549 Dorsalgia, unspecified: Secondary | ICD-10-CM

## 2016-12-12 DIAGNOSIS — E034 Atrophy of thyroid (acquired): Secondary | ICD-10-CM | POA: Diagnosis not present

## 2016-12-12 DIAGNOSIS — E559 Vitamin D deficiency, unspecified: Secondary | ICD-10-CM | POA: Diagnosis not present

## 2016-12-12 DIAGNOSIS — G4701 Insomnia due to medical condition: Secondary | ICD-10-CM

## 2016-12-12 DIAGNOSIS — F329 Major depressive disorder, single episode, unspecified: Secondary | ICD-10-CM

## 2016-12-12 DIAGNOSIS — N183 Chronic kidney disease, stage 3 unspecified: Secondary | ICD-10-CM

## 2016-12-12 DIAGNOSIS — F325 Major depressive disorder, single episode, in full remission: Secondary | ICD-10-CM

## 2016-12-12 DIAGNOSIS — G8929 Other chronic pain: Secondary | ICD-10-CM | POA: Diagnosis not present

## 2016-12-12 LAB — COMPREHENSIVE METABOLIC PANEL
ALT: 17 U/L (ref 0–35)
AST: 26 U/L (ref 0–37)
Albumin: 4 g/dL (ref 3.5–5.2)
Alkaline Phosphatase: 86 U/L (ref 39–117)
BUN: 20 mg/dL (ref 6–23)
CALCIUM: 9.7 mg/dL (ref 8.4–10.5)
CHLORIDE: 105 meq/L (ref 96–112)
CO2: 22 meq/L (ref 19–32)
Creatinine, Ser: 1.46 mg/dL — ABNORMAL HIGH (ref 0.40–1.20)
GFR: 37.24 mL/min — AB (ref >60.00)
GLUCOSE: 89 mg/dL (ref 70–99)
Potassium: 4.1 mEq/L (ref 3.5–5.1)
Sodium: 137 mEq/L (ref 135–145)
Total Bilirubin: 0.5 mg/dL (ref 0.2–1.2)
Total Protein: 6.2 g/dL (ref 6.0–8.3)

## 2016-12-12 LAB — VITAMIN D 25 HYDROXY (VIT D DEFICIENCY, FRACTURES): VITD: 37.24 ng/mL (ref 30.00–100.00)

## 2016-12-12 MED ORDER — DIAZEPAM 5 MG PO TABS: 5.0000 mg | ORAL_TABLET | Freq: Every day | ORAL | 3 refills | Status: DC | PRN

## 2016-12-12 MED ORDER — ESCITALOPRAM OXALATE 20 MG PO TABS: ORAL_TABLET | ORAL | 5 refills | Status: AC

## 2016-12-12 MED ORDER — OXYCODONE-ACETAMINOPHEN 10-325 MG PO TABS: 1.0000 | ORAL_TABLET | Freq: Three times a day (TID) | ORAL | 0 refills | Status: DC | PRN

## 2016-12-12 NOTE — Progress Notes (Signed)
Subjective:  Patient ID: DELLE ANDRZEJEWSKI, female    DOB: [birth year]  Age: 76 y.o. MRN: [id number]  CC: The primary encounter diagnosis was Vitamin D deficiency. Diagnoses of Primary hyperparathyroidism ([place]), Hypercalcemia, Chronic back pain greater than 3 months duration, CKD (chronic kidney disease) stage 3, GFR 30-59 ml/min, Reactive depression, Hypothyroidism due to acquired atrophy of thyroid, Insomnia secondary to chronic pain, and Major depressive disorder with single episode, in remission [facility]) were also pertinent to this visit.  HPI [name] presents for 3 MONTH FOLLOW UP   NOT SLEEPING WELL DUE TO LIFE STRESSORS:   BROTHER DIED LAST December 15, 2022.  HAS LOST 3 BROTHERS AND ONE SISTER, ONLY ONE LEFT AND HE IS 34  HUSBAND DAVID HAS A PULMONARY NODULE  AND IS UNDERGOING BIOPSY THIS Friday   INSOMNIA regimen :  DOXEPIN AND VALIUM    USING 2 PERCOCET DAILY . HAS BEEN WAKING UP [place] SHE IS HEARING A PHONE RINGING.  DISCUSSED INCREASING LEXAPRO TO 20 MG DAILY    CHRONIC BACK PAIN: she is using 2 percocet daily to manage her chronic back pain   She has not had any ER visits  And has not requested any early refills.  Her Refill history was confirmed via [place] Controlled Substance database by me today during her visit and there have been no prescriptions of controlled substances filled from any providers other than me. .   STILL SEEING ENT FOR CHRONIC EAR INFECTION   NOT CURRENTLY ON ANTIBIOTICS .  MASTOID BONE CT'D YESTERDAY  UROLOGY DOING A CT OF HER KIDNEYS,  HAS A STONE ,  HAD 3 RECENT UTI'S  IN [facility]  HAS A ct tomorrow   Lab Results  Component Value Date   HGBA1C 5.3 06/12/2016     Outpatient Medications Prior to Visit  Medication Sig Dispense Refill  . Ascorbic Acid (VITAMIN C) 1000 MG tablet Take 500 mg by mouth daily.     . benzonatate (TESSALON) 200 MG capsule TAKE 1 CAPSULE (200 MG TOTAL) BY MOUTH 3 (THREE) TIMES DAILY AS NEEDED FOR COUGH. 60 capsule 3  . CLARINEX 5 MG tablet  TAKE 1 TABLET (5 MG TOTAL) BY MOUTH EVERY MORNING. 90 tablet 3  . Coenzyme Q10 100 MG TABS Take 1 tablet by mouth daily.    Marland Kitchen COZAAR 50 MG tablet TAKE 1 TABLET BY MOUTH TWICE A DAY 180 tablet 4  . CRESTOR 20 MG tablet TAKE 1 TABLET (20 MG TOTAL) BY MOUTH DAILY. 90 tablet 1  . cyanocobalamin (,VITAMIN B-12,) 1000 MCG/ML injection FOR USE WITH MONTHLY B12 INJECTIONS 10 mL 1  . diclofenac sodium (VOLTAREN) 1 % GEL APPLY 4 GRAMS TWICE A DAY    . doxepin (SINEQUAN) 25 MG capsule TAKE 2 CAPSULES (50 MG TOTAL) BY MOUTH AT BEDTIME. 180 capsule 1  . fluticasone (FLONASE) 50 MCG/ACT nasal spray     . isosorbide mononitrate (IMDUR) 60 MG 24 hr tablet TAKE 1 TABLET (60 MG TOTAL) BY MOUTH DAILY. 90 tablet 3  . levothyroxine (SYNTHROID, LEVOTHROID) 137 MCG tablet Take 137 mcg by mouth daily before breakfast.    . LIDODERM 5 % PLACE 3 PATCHES ONTO SKIN DAILY AND DISCARD WITH 12 HOURS OR AS DIRECTED 90 patch 5  . montelukast (SINGULAIR) 10 MG tablet Take 1 tablet (10 mg total) by mouth at bedtime. 90 tablet 1  . Multiple Vitamin (MULTI-VITAMINS) TABS Take 1 tablet by mouth daily.      Marland Kitchen NEEDLE, DISP, 23 G (BD  DISP NEEDLE) 23G X 1" MISC Uses to inject b12 into muscle once monthly 12 each 1  . nitroGLYCERIN (NITROSTAT) 0.4 MG SL tablet Place 1 tablet (0.4 mg total) under the tongue every 5 (five) minutes as needed for chest pain. 50 tablet 3  . NORVASC 10 MG tablet TAKE 1 TABLET (10 MG TOTAL) BY MOUTH DAILY. 90 tablet 1  . omega-3 acid ethyl esters (LOVAZA) 1 g capsule TAKE 2 CAPSULES (2 G TOTAL) BY MOUTH 2 (TWO) TIMES DAILY. 360 capsule 3  . Prenatal Vit-Fe Fumarate-FA (PRENATAL 1 PLUS 1 PO) Take 1 tablet by mouth daily.    Marland Kitchen PREVIDENT 5000 DRY MOUTH 1.1 % GEL dental gel     . pyridOXINE (VITAMIN B-6) 100 MG tablet Take 100 mg by mouth daily.    . sodium chloride (OCEAN) 0.65 % nasal spray Place 2 sprays into the nose as needed. Allergies      . Vitamin D, Ergocalciferol, (DRISDOL) 50000 units CAPS capsule TAKE  1 CAPSULE (50,000 UNITS TOTAL) BY MOUTH EVERY 7 (SEVEN) DAYS. 12 capsule 0  . diazepam (VALIUM) 5 MG tablet Take 1 tablet (5 mg total) by mouth daily as needed for anxiety. 30 tablet 3  . escitalopram (LEXAPRO) 10 MG tablet TAKE 1 TABLET (10 MG TOTAL) BY MOUTH DAILY. 90 tablet 2  . oxyCODONE-acetaminophen (PERCOCET) 10-325 MG tablet Take 1 tablet by mouth every 8 (eight) hours as needed for pain. May refill on or after November 13 2016 60 tablet 0  . oseltamivir (TAMIFLU) 75 MG capsule Take 1 capsule (75 mg total) by mouth daily. (Patient not taking: Reported on 12/04/2016) 10 capsule 0  . phenazopyridine (PYRIDIUM) 200 MG tablet Take 1 tablet (200 mg total) by mouth 3 (three) times daily as needed for pain. NAME BRAND ONLY (Patient not taking: Reported on 09/13/2016) 10 tablet 0  . SYNTHROID 137 MCG tablet TAKE 1 TABLET (137 MCG TOTAL) BY MOUTH DAILY BEFORE BREAKFAST. (Patient not taking: Reported on 12/12/2016) 90 tablet 0  . SYRINGE-NEEDLE, DISP, 3 ML 25G X 1" 3 ML MISC Please dispense so patient can administer B12 injections monthly (Patient not taking: Reported on 12/12/2016) 50 each 1  . tobramycin (NEBCIN) 80 MG/2ML SOLN injection      Facility-Administered Medications Prior to Visit  Medication Dose Route Frequency Provider Last Rate Last Dose  . chlorhexidine (HIBICLENS) 4 % liquid 4 application  60 mL Topical Once Gaynelle Arabian, MD        Review of Systems;  Patient denies headache, fevers, malaise, unintentional weight loss, skin rash, eye pain, sinus congestion and sinus pain, sore throat, dysphagia,  hemoptysis , cough, dyspnea, wheezing, chest pain, palpitations, orthopnea, edema, abdominal pain, nausea, melena, diarrhea, constipation, flank pain, dysuria, hematuria, urinary  Frequency, nocturia, numbness, tingling, seizures,  Focal weakness, Loss of consciousness,  Tremor, insomnia, depression, anxiety, and suicidal ideation.      Objective:  BP 130/60   Pulse 64   Temp 98.2 F (36.8  C) (Oral)   Resp 16   Ht 5' 2.5" (1.588 m)   Wt 203 lb 9.6 oz (92.4 kg)   SpO2 94%   BMI 36.65 kg/m   BP Readings from Last 3 Encounters:  12/12/16 130/60  12/04/16 (!) 148/79  06/20/16 (!) 177/81    Wt Readings from Last 3 Encounters:  12/12/16 203 lb 9.6 oz (92.4 kg)  12/04/16 203 lb 12.8 oz (92.4 kg)  09/13/16 205 lb (93 kg)    General appearance: alert, cooperative  and appears stated age Ears: normal TM's and external ear canals both ears Throat: lips, mucosa, and tongue normal; teeth and gums normal Neck: no adenopathy, no carotid bruit, supple, symmetrical, trachea midline and thyroid not enlarged, symmetric, no tenderness/mass/nodules Back: symmetric, no curvature. ROM normal. No CVA tenderness. Lungs: clear to auscultation bilaterally Heart: regular rate and rhythm, S1, S2 normal, no murmur, click, rub or gallop Abdomen: soft, non-tender; bowel sounds normal; no masses,  no organomegaly Pulses: 2+ and symmetric Skin: Skin color, texture, turgor normal. No rashes or lesions Lymph nodes: Cervical, supraclavicular, and axillary nodes normal.  Lab Results  Component Value Date   HGBA1C 5.3 06/12/2016   HGBA1C 4.5 (L) 04/04/2013   HGBA1C 5.8 04/18/2011    Lab Results  Component Value Date   CREATININE 1.46 (H) 12/12/2016   CREATININE 1.16 08/07/2016   CREATININE 1.13 06/12/2016    Lab Results  Component Value Date   WBC 8.7 04/08/2016   HGB 13.8 04/08/2016   HCT 39.9 04/08/2016   PLT 156 04/08/2016   GLUCOSE 89 12/12/2016   CHOL 136 08/07/2016   TRIG 152.0 (H) 08/07/2016   HDL 48.70 08/07/2016   LDLDIRECT 64.7 01/01/2012   LDLCALC 57 08/07/2016   ALT 17 12/12/2016   AST 26 12/12/2016   NA 137 12/12/2016   K 4.1 12/12/2016   CL 105 12/12/2016   CREATININE 1.46 (H) 12/12/2016   BUN 20 12/12/2016   CO2 22 12/12/2016   TSH 0.36 06/12/2016   INR 1.03 11/28/2013   HGBA1C 5.3 06/12/2016   MICROALBUR 16.6 (H) 06/12/2016    Dg Bone Density  Result  Date: 11/01/2016 EXAM: DUAL X-RAY ABSORPTIOMETRY (DXA) FOR BONE MINERAL DENSITY IMPRESSION: Dear Dr. Crecencio Mc, Your patient Selby Slovacek completed a BMD test on 11/01/2016 using the [place] (analysis version: 14.10) manufactured by [employer]. The following summarizes the results of our evaluation. PATIENT BIOGRAPHICAL: Name: Tiarrah, Saville Patient ID: [id number] Birth Date: 07-21-1941 Height: 62.5 in. Gender: Female Exam Date: 11/01/2016 Weight: 205.0 lbs. Indications: Advanced Age, Caucasian, History of Spinal Surgery, Hysterectomy, Postmenopausal Fractures: Foot Treatments: Multi-Vitamin with calcium, Vitamin D ASSESSMENT: The BMD measured at Forearm Radius 33% is 0.517 g/cm2 with a T-score of -4.1. This patient is considered osteoporotic according to [facility]) criteria. Lumbar spine was not used due to surgical hardware. Site Region Measured Measured WHO Young Adult BMD Date       Age      Classification T-score DualFemur Total Left 11/01/[age] Osteopenia -2.3 0.723 g/cm2 Left Forearm Radius 33% 11/01/2016 73.7 Osteoporosis -4.1 0.517 g/cm2 World Health Organization [facility]) criteria for post-menopausal, Caucasian Women: Normal:       T-score at or above -1 SD Osteopenia:   T-score between -1 and -2.5 SD Osteoporosis: T-score at or below -2.5 SD RECOMMENDATIONS: [place] recommends that FDA-approved medical therapies be considered in postmenopausal women and men age 36 or older with a: 1. Hip or vertebral (clinical or morphometric) fracture. 2. T-score of < -2.5 at the spine or hip. 3. Ten-year fracture probability by FRAX of 3% or greater for hip fracture or 20% or greater for major osteoporotic fracture. All treatment decisions require clinical judgment and consideration of individual patient factors, including patient preferences, co-morbidities, previous drug use, risk factors not captured in the FRAX model (e.g. falls, vitamin D deficiency,  increased bone turnover, interval significant decline in bone density) and possible under - or over-estimation of fracture risk by FRAX.  All patients should ensure an adequate intake of dietary calcium (1200 mg/d) and vitamin D (800 IU daily) unless contraindicated. FOLLOW-UP: People with diagnosed cases of osteoporosis or at high risk for fracture should have regular bone mineral density tests. For patients eligible for Medicare, routine testing is allowed once every 2 years. The testing frequency can be increased to one year for patients who have rapidly progressing disease, those who are receiving or discontinuing medical therapy to restore bone mass, or have additional risk factors. I have reviewed this report, and agree with the above findings. [facility]iology Electronically Signed   By: Lowella Grip III M.D.   On: 11/01/2016 14:51   Mm Screening Breast Tomo Bilateral  Result Date: 11/01/2016 CLINICAL DATA:  Screening. EXAM: 2D DIGITAL SCREENING BILATERAL MAMMOGRAM WITH CAD AND ADJUNCT TOMO COMPARISON:  Previous exam(s). ACR Breast Density Category b: There are scattered areas of fibroglandular density. FINDINGS: There are no findings suspicious for malignancy. Images were processed with CAD. IMPRESSION: No mammographic evidence of malignancy. A result letter of this screening mammogram will be mailed directly to the patient. RECOMMENDATION: Screening mammogram in one year. (Code:SM-B-01Y) BI-RADS CATEGORY  1: Negative. Electronically Signed   By: Lillia Mountain M.D.   On: 11/01/2016 15:25    Assessment & Plan:   Problem List Items Addressed This Visit    Chronic back pain greater than 3 months duration    At her next visit we will continue the gradual  weaning her from chronic daily use of percocet given the normal myelogram and CT that were done last October.  She will return in 3 months . Refills given .        Relevant Medications   oxyCODONE-acetaminophen (PERCOCET) 10-325 MG tablet     CKD (chronic kidney disease) stage 3, GFR 30-59 ml/min    Renal function is stable,  She is avoiding NSAIDs and diuretics  Lab Results  Component Value Date   CREATININE 1.46 (H) 12/12/2016   Lab Results  Component Value Date   NA 137 12/12/2016   K 4.1 12/12/2016   CL 105 12/12/2016   CO2 22 12/12/2016  '      Clinical depression    complicated currently by grief.  Continue lexapro ,  Refills on valium given.       Relevant Medications   diazepam (VALIUM) 5 MG tablet   escitalopram (LEXAPRO) 20 MG tablet   Hypercalcemia    Resolved with suspension of calcium supplements.  Has secondary hyperparathyroidism due to CKD. Lab Results  Component Value Date   PTH 143 (H) 08/07/2016   CALCIUM 9.7 12/12/2016   PHOS 3.1 02/24/2015         Relevant Orders   Comprehensive metabolic panel (Completed)   Hypothyroidism    Thyroid function is WNL on current dose.  No current changes needed.   Lab Results  Component Value Date   TSH 0.36 06/12/2016         Insomnia secondary to chronic pain    Lorrin Mais has been discontinued, due to chronic daily use of opioids and benzodiazepines.  Continue sinequan      Relevant Medications   escitalopram (LEXAPRO) 20 MG tablet   oxyCODONE-acetaminophen (PERCOCET) 10-325 MG tablet   Major depressive disorder with single episode, in remission ([place])    Continue daily use of lexapro per patient preference.       Relevant Medications   diazepam (VALIUM) 5 MG tablet   escitalopram (LEXAPRO) 20 MG tablet  Other Visit Diagnoses    Vitamin D deficiency    -  Primary   Relevant Orders   VITAMIN D 25 Hydroxy (Vit-D Deficiency, Fractures) (Completed)   Primary hyperparathyroidism ([place])       Relevant Orders   PTH, Intact and Calcium      I have discontinued Ms. Wyche's tobramycin, SYRINGE-NEEDLE (DISP) 3 ML, phenazopyridine, oseltamivir, oxyCODONE-acetaminophen, SYNTHROID, and oxyCODONE-acetaminophen. I have also changed her  escitalopram and oxyCODONE-acetaminophen. Additionally, I am having her maintain her sodium chloride, NEEDLE (DISP) 23 G, vitamin C, Prenatal Vit-Fe Fumarate-FA (PRENATAL 1 PLUS 1 PO), pyridOXINE, Coenzyme Q10, cyanocobalamin, fluticasone, PREVIDENT 5000 DRY MOUTH, montelukast, nitroGLYCERIN, CLARINEX, levothyroxine, CRESTOR, COZAAR, omega-3 acid ethyl esters, isosorbide mononitrate, benzonatate, Vitamin D (Ergocalciferol), LIDODERM, doxepin, NORVASC, diclofenac sodium, MULTI-VITAMINS, and diazepam.  Meds ordered this encounter  Medications  . diazepam (VALIUM) 5 MG tablet    Sig: Take 1 tablet (5 mg total) by mouth daily as needed for anxiety.    Dispense:  30 tablet    Refill:  3  . escitalopram (LEXAPRO) 20 MG tablet    Sig: TAKE 1 TABLET (10 MG TOTAL) BY MOUTH DAILY.    Dispense:  30 tablet    Refill:  5  . DISCONTD: oxyCODONE-acetaminophen (PERCOCET) 10-325 MG tablet    Sig: Take 1 tablet by mouth every 8 (eight) hours as needed for pain. May refill on or after December 14 2016    Dispense:  60 tablet    Refill:  0  . DISCONTD: oxyCODONE-acetaminophen (PERCOCET) 10-325 MG tablet    Sig: Take 1 tablet by mouth every 8 (eight) hours as needed for pain. May refill on or after Mayl  26 2018    Dispense:  60 tablet    Refill:  0  . oxyCODONE-acetaminophen (PERCOCET) 10-325 MG tablet    Sig: Take 1 tablet by mouth every 8 (eight) hours as needed for pain. May refill on or after Feb 13 2017    Dispense:  60 tablet    Refill:  0    Medications Discontinued During This Encounter  Medication Reason  . oseltamivir (TAMIFLU) 75 MG capsule Therapy completed  . phenazopyridine (PYRIDIUM) 200 MG tablet Patient has not taken in last 30 days  . SYNTHROID 383 MCG tablet Duplicate  . tobramycin (NEBCIN) 80 MG/2ML SOLN injection Patient has not taken in last 30 days  . SYRINGE-NEEDLE, DISP, 3 ML 25G X 1" 3 ML MISC Patient has not taken in last 30 days  . diazepam (VALIUM) 5 MG tablet Reorder  .  escitalopram (LEXAPRO) 10 MG tablet Reorder  . oxyCODONE-acetaminophen (PERCOCET) 10-325 MG tablet Reorder  . oxyCODONE-acetaminophen (PERCOCET) 10-325 MG tablet Reorder  . oxyCODONE-acetaminophen (PERCOCET) 10-325 MG tablet Reorder    Follow-up: No Follow-up on file.   Crecencio Mc, MD

## 2016-12-12 NOTE — Patient Instructions (Signed)
I RECOMMEND INCREASING YOUR LEXAPRO TO 20 MG DAILY FOR THE NEXT  MONTH OR SO TO HELP MANAGE YOUR ANXIETY  I WILL REFILL YOUR VITAMIN D ONCE I SEE YOUR VIT D LEVEL FRO M TODAY'S LABS

## 2016-12-12 NOTE — Progress Notes (Signed)
Pre visit review using our clinic review tool, if applicable. No additional management support is needed unless otherwise documented below in the visit note. 

## 2016-12-13 ENCOUNTER — Ambulatory Visit
Admission: RE | Admit: 2016-12-13 | Discharge: 2016-12-13 | Disposition: A | Discharge status: Home / Self Care | Payer: Federal, State, Local not specified - PPO | Source: Ambulatory Visit | Attending: Urology | Admitting: Urology

## 2016-12-13 DIAGNOSIS — N390 Urinary tract infection, site not specified: Secondary | ICD-10-CM

## 2016-12-13 DIAGNOSIS — N200 Calculus of kidney: Secondary | ICD-10-CM | POA: Insufficient documentation

## 2016-12-14 NOTE — Assessment & Plan Note (Signed)
Renal function is stable,  She is avoiding NSAIDs and diuretics  Lab Results  Component Value Date   CREATININE 1.46 (H) 12/12/2016   Lab Results  Component Value Date   NA 137 12/12/2016   K 4.1 12/12/2016   CL 105 12/12/2016   CO2 22 12/12/2016  '

## 2016-12-14 NOTE — Assessment & Plan Note (Signed)
complicated currently by grief.  Continue lexapro ,  Refills on valium given.

## 2016-12-14 NOTE — Assessment & Plan Note (Addendum)
At her next visit we will continue the gradual  weaning her from chronic daily use of percocet given the normal myelogram and CT that were done last October.  She will return in 3 months . Refills given .

## 2016-12-14 NOTE — Assessment & Plan Note (Signed)
Continue daily use of lexapro per patient preference.

## 2016-12-14 NOTE — Assessment & Plan Note (Signed)
Resolved with suspension of calcium supplements.  Has secondary hyperparathyroidism due to CKD. Lab Results  Component Value Date   PTH 143 (H) 08/07/2016   CALCIUM 9.7 12/12/2016   PHOS 3.1 02/24/2015

## 2016-12-14 NOTE — Assessment & Plan Note (Signed)
Thyroid function is WNL on current dose.  No current changes needed.   Lab Results  Component Value Date   TSH 0.36 06/12/2016

## 2016-12-14 NOTE — Assessment & Plan Note (Signed)
[name] has been discontinued, due to chronic daily use of opioids and benzodiazepines.  Continue sinequan

## 2016-12-18 ENCOUNTER — Telehealth: Payer: Self-pay | Admitting: *Deleted

## 2016-12-18 ENCOUNTER — Ambulatory Visit: Payer: Federal, State, Local not specified - PPO | Admitting: Urology

## 2016-12-18 ENCOUNTER — Telehealth: Payer: Self-pay | Admitting: Radiology

## 2016-12-18 NOTE — Telephone Encounter (Signed)
If patient is seeing Nephrology,  They can run it when they see her.   If she is not,  She should have it done here

## 2016-12-18 NOTE — Telephone Encounter (Signed)
Open in error

## 2016-12-18 NOTE — Telephone Encounter (Signed)
Not seeing where pt is seeing her Nephrologist anytime soon, can you call patient to reschedule lab appt to have PTH redrawn?

## 2016-12-18 NOTE — Telephone Encounter (Signed)
[facility] lab called and stated they are unable to run the PTH, intact calcium. Would you like pt to come back in and be redrawn for test?

## 2016-12-19 ENCOUNTER — Telehealth: Payer: Self-pay

## 2016-12-19 ENCOUNTER — Other Ambulatory Visit: Payer: Self-pay | Admitting: Internal Medicine

## 2016-12-19 ENCOUNTER — Other Ambulatory Visit: Payer: Self-pay | Admitting: Radiology

## 2016-12-19 ENCOUNTER — Encounter: Payer: Self-pay | Admitting: Internal Medicine

## 2016-12-19 ENCOUNTER — Other Ambulatory Visit (INDEPENDENT_AMBULATORY_CARE_PROVIDER_SITE_OTHER): Payer: Federal, State, Local not specified - PPO

## 2016-12-19 DIAGNOSIS — R300 Dysuria: Secondary | ICD-10-CM

## 2016-12-19 DIAGNOSIS — N183 Chronic kidney disease, stage 3 unspecified: Secondary | ICD-10-CM

## 2016-12-19 DIAGNOSIS — E039 Hypothyroidism, unspecified: Secondary | ICD-10-CM

## 2016-12-19 LAB — POCT URINALYSIS DIPSTICK
BILIRUBIN UA: NEGATIVE
Glucose, UA: 100
KETONES UA: 15
Nitrite, UA: POSITIVE
PH UA: 5 (ref 5.0–8.0)
Protein, UA: >=300
Spec Grav, UA: 1.02 (ref 1.010–1.025)
Urobilinogen, UA: 2 E.U./dL — AB

## 2016-12-19 LAB — URINALYSIS, MICROSCOPIC ONLY

## 2016-12-19 NOTE — Telephone Encounter (Signed)
Pt has been scheduled for today at 3pm.

## 2016-12-19 NOTE — Telephone Encounter (Signed)
Spoke with pt today about rescheduling her lab appt to have her PHT redrawn and she stated that she believes she has a UTI. She stated that this morning she started having some issues with incontinence, dysuria and cloudy urine. Pt is coming in today at 3pm to have lab redrawn would it be ok for pt to leave a urine specimen at that time? Please advise.

## 2016-12-19 NOTE — Telephone Encounter (Signed)
Spoke with pt and let her know that she can leave a urine sample when she stops by to get her blood drawn today.

## 2016-12-19 NOTE — Telephone Encounter (Signed)
Urine studies ordered.  In the future ok to order without asking.  I will never say no,.

## 2016-12-20 ENCOUNTER — Ambulatory Visit: Payer: Medicare Other | Admitting: Urology

## 2016-12-20 LAB — URINE CULTURE: ORGANISM ID, BACTERIA: NO GROWTH

## 2016-12-20 LAB — PTH, INTACT AND CALCIUM
CALCIUM: 9.6 mg/dL (ref 8.6–10.4)
PTH: 116 pg/mL — AB (ref 14–64)

## 2016-12-21 ENCOUNTER — Encounter: Payer: Self-pay | Admitting: Internal Medicine

## 2016-12-21 ENCOUNTER — Telehealth: Payer: Self-pay | Admitting: *Deleted

## 2016-12-21 LAB — PTH, INTACT AND CALCIUM

## 2016-12-21 NOTE — Telephone Encounter (Signed)
Patient has requested lab results Pt contact [phone]

## 2016-12-21 NOTE — Telephone Encounter (Signed)
Called pt and gave her the same information that Dr. Derrel Nip sent her through her my chart. The pt stated that her computer is messed up and can no longer receive mychart messages. The pt gave a verbal understanding to everything.

## 2016-12-21 NOTE — Telephone Encounter (Signed)
Please advise 

## 2016-12-22 ENCOUNTER — Telehealth: Payer: Self-pay

## 2016-12-22 NOTE — Telephone Encounter (Signed)
-----   Message from Nori Riis, PA-C sent at 12/21/2016  3:16 PM EDT ----- Please call Mrs. Milsap and ask her if she is having urinary symptoms and if so, she needs to come in for a CATH UA for culture.

## 2016-12-22 NOTE — Telephone Encounter (Signed)
Spoke with pt in reference to UTI like symptoms. Pt stated she currently is not having s/s. Reinforced with pt if s/s return to call back. Pt voiced understanding,

## 2016-12-23 ENCOUNTER — Other Ambulatory Visit: Payer: Self-pay | Admitting: Internal Medicine

## 2016-12-27 ENCOUNTER — Other Ambulatory Visit: Payer: Self-pay | Admitting: Internal Medicine

## 2017-01-04 NOTE — Telephone Encounter (Signed)
Mailed unread message to patient.  

## 2017-01-14 ENCOUNTER — Other Ambulatory Visit: Payer: Self-pay | Admitting: Internal Medicine

## 2017-01-22 ENCOUNTER — Other Ambulatory Visit: Payer: Self-pay | Admitting: Internal Medicine

## 2017-01-30 ENCOUNTER — Ambulatory Visit (INDEPENDENT_AMBULATORY_CARE_PROVIDER_SITE_OTHER): Payer: Federal, State, Local not specified - PPO | Admitting: Family

## 2017-01-30 ENCOUNTER — Encounter: Payer: Self-pay | Admitting: Family

## 2017-01-30 VITALS — BP 140/80 | HR 83 | Temp 97.8°F | Ht 62.25 in

## 2017-01-30 DIAGNOSIS — B379 Candidiasis, unspecified: Secondary | ICD-10-CM | POA: Diagnosis not present

## 2017-01-30 DIAGNOSIS — T3695XA Adverse effect of unspecified systemic antibiotic, initial encounter: Secondary | ICD-10-CM | POA: Diagnosis not present

## 2017-01-30 DIAGNOSIS — W57XXXA Bitten or stung by nonvenomous insect and other nonvenomous arthropods, initial encounter: Secondary | ICD-10-CM

## 2017-01-30 MED ORDER — FLUCONAZOLE 150 MG PO TABS: 150.0000 mg | ORAL_TABLET | Freq: Once | ORAL | 1 refills | Status: AC

## 2017-01-30 MED ORDER — MUPIROCIN 2 % EX OINT: 1.0000 "application " | TOPICAL_OINTMENT | Freq: Two times a day (BID) | CUTANEOUS | 0 refills | Status: AC

## 2017-01-30 NOTE — Patient Instructions (Signed)
Ointment for bug bites  Complete doxcycline  Ensure to take probiotics while on antibiotics and also for 2 weeks after completion. It is important to re-colonize the gut with good bacteria and also to prevent any diarrheal infections associated with antibiotic use.   Diflucan if you need it due to multiple antibiotics  Let me know if not better

## 2017-01-30 NOTE — Progress Notes (Signed)
Pre visit review using our clinic review tool, if applicable. No additional management support is needed unless otherwise documented below in the visit note. 

## 2017-01-30 NOTE — Progress Notes (Signed)
Subjective:    Patient ID: [name], female    DOB: July 06, 1941, 76 y.o.   MRN: [id number]  CC: [name] is a 76 y.o. female who presents today for an acute visit.    HPI: CC: 'bug bites' behind left ankle and left knee, x one week, unchanged.   Itchy, redness. Has tried peroxide and tea tree oil which has not helped.   NO bulls eye rash, joint pain, fever, N, V. Hasn't pulled any ticks off.   No  One else in household has bug bites. [place] and [place].   Recently for uti with IM rocephin, ciprofloxacin ( has 2 days left)   Has just started yesterday on doxycycline 100mg  BID x 14 days  from ent for ear infection.       HISTORY:  Past Medical History:  Diagnosis Date  . Asthma 06/2010   depending on what around  . Blood transfusion 2009   back surgery  . Brain aneurysm    S/P RESECTION  . CHF (congestive heart failure) ([place]) 05/2010   after receiving Propofol  . Chronic sinusitis   . Complication of anesthesia 05/30/2010   from colonoscopy-had Propofol-had a  . Complication of anesthesia    reaction-went into asthma-pneumonia,-  . Complication of anesthesia    then CHF and to ICU  . Degenerative disc disease    HERNITAED DISC BY MRI ON 07/07;S/P LAMINECTOMY,RID REPLACEMENT  . Discoid lupus erythematosus eyelid    NEGATIVE SEROLOGIC MARKERS  . Dysrhythmia    PVC's occ.  Marland Kitchen Headache(784.0)    migraines occ.  Marland Kitchen Hypercholesterolemia   . Hypertension   . Hypothyroidism    takes Synthroid  . Pneumonia 05/30/2010   after receiving Propofol  . Renal insufficiency    Patient states she has been told she has Stage I kidney disease  . Sleep apnea    uses CPAP-8.4-oxygen suppliment  . Systemic lupus erythematosus ([place]) 05/15/2012   Past Surgical History:  Procedure Laterality Date  . ABDOMINAL HYSTERECTOMY  1988   uterus only  . BACK SURGERY  1968,1988,2002,2009,2010   4 laminectomies and fusions  . BRAIN SURGERY  2005   coiling of cranial  aneurysum  . BREAST EXCISIONAL BIOPSY Right 30+ YRS AGO   NEG  . COLONOSCOPY  2008  . COLONOSCOPY WITH PROPOFOL N/A 01/13/2015   Procedure: COLONOSCOPY WITH PROPOFOL;  Surgeon: Christene Lye, MD;  Location: [facility];  Service: Endoscopy;  Laterality: N/A;  . COLONOSCOPY WITH PROPOFOL N/A 03/24/2015   Procedure: COLONOSCOPY WITH PROPOFOL;  Surgeon: Christene Lye, MD;  Location: [facility];  Service: Endoscopy;  Laterality: N/A;  . COSMETIC SURGERY    . [place]   left foot  . EYE SURGERY     both cataracts -[place] 7/14  . FOOT ARTHRODESIS Right 05/22/2013   Procedure: RIGHT HALLUX METATARSAL PHALANGEAL JOINT ARTHRODESIS ;  Surgeon: Wylene Simmer, MD;  Location: [facility];  Service: Orthopedics;  Laterality: Right;  . JOINT REPLACEMENT  2013   Right knee,  [place]  . lipomas  1975   3 from right breast  . NASAL SINUS SURGERY  1977-2008   6 surgeries all together  . PERIPHERAL VASCULAR CATHETERIZATION N/A 01/25/2015   Procedure: Visceral Angiography;  Surgeon: Algernon Huxley, MD;  Location: [facility];  Service: Cardiovascular;  Laterality: N/A;  . RIGHT HEART CATHETERIZATION N/A 11/28/2013   Procedure: RIGHT HEART CATH;  Surgeon: Jolaine Artist, MD;  Location: [facility];  Service: Cardiovascular;  Laterality: N/A;  . TONSILLECTOMY  1963  . TOTAL KNEE ARTHROPLASTY  07/10/2011   Procedure: TOTAL KNEE ARTHROPLASTY;  Surgeon: Gearlean Alf;  Location: [facility];  Service: Orthopedics;  Laterality: Right;  . TYMPANIC MEMBRANE REPAIR  2009-last one   x5 times   Family History  Problem Relation Age of Onset  . Heart disease Mother   . Stroke Mother   . Alcohol abuse Other   . Cancer Sister        leukemia  . Cancer Brother        lung Ca, asbestos  . Breast cancer Paternal Aunt   . Prostate cancer Neg Hx   . Kidney cancer Neg Hx   . Bladder Cancer Neg Hx     Allergies: Penicillins; Penicillins; Propofol; Propranolol;  Triamcinolone acetonide; Adhesive [tape]; Kenalog [triamcinolone acetonide]; Septra [bactrim]; and Sulfur Current Outpatient Prescriptions on File Prior to Visit  Medication Sig Dispense Refill  . Ascorbic Acid (VITAMIN C) 1000 MG tablet Take 500 mg by mouth daily.     . benzonatate (TESSALON) 200 MG capsule TAKE 1 CAPSULE (200 MG TOTAL) BY MOUTH 3 (THREE) TIMES DAILY AS NEEDED FOR COUGH. 60 capsule 3  . CLARINEX 5 MG tablet TAKE 1 TABLET (5 MG TOTAL) BY MOUTH EVERY MORNING. 90 tablet 3  . Coenzyme Q10 100 MG TABS Take 1 tablet by mouth daily.    Marland Kitchen COZAAR 50 MG tablet TAKE 1 TABLET BY MOUTH TWICE A DAY 180 tablet 4  . CRESTOR 20 MG tablet TAKE 1 TABLET (20 MG TOTAL) BY MOUTH DAILY. 90 tablet 1  . cyanocobalamin (,VITAMIN B-12,) 1000 MCG/ML injection FOR USE WITH MONTHLY B12 INJECTIONS 10 mL 0  . diazepam (VALIUM) 5 MG tablet Take 1 tablet (5 mg total) by mouth daily as needed for anxiety. 30 tablet 3  . diclofenac sodium (VOLTAREN) 1 % GEL APPLY 4 GRAMS TWICE A DAY    . doxepin (SINEQUAN) 25 MG capsule TAKE 2 CAPSULES (50 MG TOTAL) BY MOUTH AT BEDTIME. 180 capsule 1  . escitalopram (LEXAPRO) 20 MG tablet TAKE 1 TABLET (10 MG TOTAL) BY MOUTH DAILY. 30 tablet 5  . fluticasone (FLONASE) 50 MCG/ACT nasal spray     . isosorbide mononitrate (IMDUR) 60 MG 24 hr tablet TAKE 1 TABLET (60 MG TOTAL) BY MOUTH DAILY. 90 tablet 3  . levothyroxine (SYNTHROID, LEVOTHROID) 137 MCG tablet Take 137 mcg by mouth daily before breakfast.    . LIDODERM 5 % PLACE 3 PATCHES ONTO SKIN DAILY AND DISCARD WITH 12 HOURS OR AS DIRECTED 90 patch 5  . montelukast (SINGULAIR) 10 MG tablet Take 1 tablet (10 mg total) by mouth at bedtime. 90 tablet 1  . Multiple Vitamin (MULTI-VITAMINS) TABS Take 1 tablet by mouth daily.      Marland Kitchen NEEDLE, DISP, 23 G (BD DISP NEEDLE) 23G X 1" MISC Uses to inject b12 into muscle once monthly 12 each 1  . nitroGLYCERIN (NITROSTAT) 0.4 MG SL tablet Place 1 tablet (0.4 mg total) under the tongue every 5  (five) minutes as needed for chest pain. 50 tablet 3  . NORVASC 10 MG tablet TAKE 1 TABLET (10 MG TOTAL) BY MOUTH DAILY. 90 tablet 1  . omega-3 acid ethyl esters (LOVAZA) 1 g capsule TAKE 2 CAPSULES (2 G TOTAL) BY MOUTH 2 (TWO) TIMES DAILY. 360 capsule 3  . oxyCODONE-acetaminophen (PERCOCET) 10-325 MG tablet Take 1 tablet by  mouth every 8 (eight) hours as needed for pain. May refill on or after Feb 13 2017 60 tablet 0  . Prenatal Vit-Fe Fumarate-FA (PRENATAL 1 PLUS 1 PO) Take 1 tablet by mouth daily.    Marland Kitchen PREVIDENT 5000 DRY MOUTH 1.1 % GEL dental gel     . pyridOXINE (VITAMIN B-6) 100 MG tablet Take 100 mg by mouth daily.    . sodium chloride (OCEAN) 0.65 % nasal spray Place 2 sprays into the nose as needed. Allergies      . Vitamin D, Ergocalciferol, (DRISDOL) 50000 units CAPS capsule TAKE 1 CAPSULE (50,000 UNITS TOTAL) BY MOUTH EVERY 7 (SEVEN) DAYS. 12 capsule 0   Current Facility-Administered Medications on File Prior to Visit  Medication Dose Route Frequency Provider Last Rate Last Dose  . chlorhexidine (HIBICLENS) 4 % liquid 4 application  60 mL Topical Once Gaynelle Arabian, MD        Social History  Substance Use Topics  . Smoking status: Former Smoker    Packs/day: 0.50    Years: 40.00    Types: Cigarettes    Quit date: 07/24/2013  . Smokeless tobacco: Never Used     Comment: REMOTELY QUIT  TOBACCO USE  . Alcohol use Yes     Comment: occasional    Review of Systems  Constitutional: Negative for chills and fever.  Respiratory: Negative for cough.   Cardiovascular: Negative for chest pain and palpitations.  Gastrointestinal: Negative for nausea and vomiting.  Musculoskeletal: Negative for arthralgias.  Skin: Positive for wound. Negative for rash.      Objective:    BP 140/80   Pulse 83   Temp 97.8 F (36.6 C) (Oral)   Ht 5' 2.25" (1.581 m)   SpO2 96%    Physical Exam  Constitutional: She appears well-developed and well-nourished.  Eyes: Conjunctivae are normal.    Cardiovascular: Normal rate, regular rhythm, normal heart sounds and normal pulses.   Pulmonary/Chest: Effort normal and breath sounds normal. She has no wheezes. She has no rhonchi. She has no rales.  Neurological: She is alert.  Skin: Skin is warm and dry.     Localized circular erythematous areas approx 2cm as noted on diagram. No purulent discharge, streaking. No bulls eye rash. No foreign body appreciated.   Psychiatric: She has a normal mood and affect. Her speech is normal and behavior is normal. Thought content normal.  Vitals reviewed.      Assessment & Plan:   1. Insect bite, initial encounter Etiology of what appears to be insect bites is unknown at this time. Patient does a lot of work in her yard, suspect that is where these came from. Fortunately patient has  been on ciprofloxacin and is currently being treated with doxycycline for an ear  infection. As I explained to patient, doxycycline is a good antibiotic for skin infections and will cover for MRSA. Advised probiotics. return precautions given. Also prescribed a topical antibiotic patient to use as well. - mupirocin ointment (BACTROBAN) 2 %; Place 1 application into the nose 2 (two) times daily.  Dispense: 22 g; Refill: 0  2. Antibiotic-induced yeast infection Given to patient as prophylaxis due to multi antibiotic use.   - fluconazole (DIFLUCAN) 150 MG tablet; Take 1 tablet (150 mg total) by mouth once. Take one tablet PO once. If continue to have symptoms, may take one tablet PO 3 days later.  Dispense: 2 tablet; Refill: 1    I am having Ms. Bloor maintain her sodium  chloride, NEEDLE (DISP) 23 G, vitamin C, Prenatal Vit-Fe Fumarate-FA (PRENATAL 1 PLUS 1 PO), pyridOXINE, Coenzyme Q10, fluticasone, PREVIDENT 5000 DRY MOUTH, montelukast, nitroGLYCERIN, CLARINEX, levothyroxine, COZAAR, omega-3 acid ethyl esters, isosorbide mononitrate, LIDODERM, doxepin, NORVASC, diclofenac sodium, MULTI-VITAMINS, diazepam, escitalopram,  oxyCODONE-acetaminophen, Vitamin D (Ergocalciferol), cyanocobalamin, CRESTOR, and benzonatate.   No orders of the defined types were placed in this encounter.   Return precautions given.   Risks, benefits, and alternatives of the medications and treatment plan prescribed today were discussed, and patient expressed understanding.   Education regarding symptom management and diagnosis given to patient on AVS.  Continue to follow with Crecencio Mc, MD for routine health maintenance.   Daron Offer and I agreed with plan.   Mable Paris, FNP

## 2017-01-31 ENCOUNTER — Encounter: Payer: Self-pay | Admitting: Family

## 2017-02-04 ENCOUNTER — Other Ambulatory Visit: Payer: Self-pay | Admitting: Internal Medicine

## 2017-02-05 NOTE — Telephone Encounter (Signed)
Last OV was 12/12/2016, gave #30 with 3 refills, please advise, no follow up scheduled, thanks

## 2017-02-06 NOTE — Telephone Encounter (Signed)
Spoke with the pharmacy, they are not sure why they sent the request she has refills remaining, thanks, refused Rx

## 2017-02-06 NOTE — Telephone Encounter (Signed)
SHE HAS REFILLS,  WHY ARE WE GETTING THIS REQUEST?  CHECK WITH PHARMACY

## 2017-02-22 ENCOUNTER — Telehealth: Payer: Self-pay | Admitting: Internal Medicine

## 2017-02-22 NOTE — Telephone Encounter (Signed)
Please advise 

## 2017-02-22 NOTE — Telephone Encounter (Signed)
Scheduled as requested.

## 2017-02-22 NOTE — Telephone Encounter (Signed)
Pt called wanting an appt in July  because of her needing a refill on PERCOCET. Scheduled her for 03/28/17. PT stated  Dr. Derrel Nip or Juliann Pulse needs to find a place and work her in because of who and what she has going on. Please advise

## 2017-02-22 NOTE — Telephone Encounter (Signed)
Try 4 on 8/23 if that is ok I will put in .

## 2017-02-22 NOTE — Telephone Encounter (Signed)
Pt stated that 03/12/2017 at 4pm will work for her.

## 2017-03-02 ENCOUNTER — Other Ambulatory Visit: Payer: Self-pay | Admitting: Internal Medicine

## 2017-03-06 ENCOUNTER — Ambulatory Visit: Payer: Federal, State, Local not specified - PPO | Admitting: Oncology

## 2017-03-12 ENCOUNTER — Encounter: Payer: Self-pay | Admitting: Internal Medicine

## 2017-03-12 ENCOUNTER — Ambulatory Visit (INDEPENDENT_AMBULATORY_CARE_PROVIDER_SITE_OTHER): Payer: Federal, State, Local not specified - PPO | Admitting: Internal Medicine

## 2017-03-12 DIAGNOSIS — F325 Major depressive disorder, single episode, in full remission: Secondary | ICD-10-CM | POA: Diagnosis not present

## 2017-03-12 DIAGNOSIS — G4701 Insomnia due to medical condition: Secondary | ICD-10-CM | POA: Diagnosis not present

## 2017-03-12 DIAGNOSIS — G8929 Other chronic pain: Secondary | ICD-10-CM

## 2017-03-12 DIAGNOSIS — E782 Mixed hyperlipidemia: Secondary | ICD-10-CM

## 2017-03-12 DIAGNOSIS — N183 Chronic kidney disease, stage 3 unspecified: Secondary | ICD-10-CM

## 2017-03-12 DIAGNOSIS — M549 Dorsalgia, unspecified: Secondary | ICD-10-CM

## 2017-03-12 MED ORDER — OXYCODONE-ACETAMINOPHEN 10-325 MG PO TABS: 1.0000 | ORAL_TABLET | Freq: Three times a day (TID) | ORAL | 0 refills | Status: AC | PRN

## 2017-03-12 MED ORDER — OXYCODONE-ACETAMINOPHEN 10-325 MG PO TABS: 1.0000 | ORAL_TABLET | Freq: Three times a day (TID) | ORAL | 0 refills | Status: DC | PRN

## 2017-03-12 NOTE — Progress Notes (Signed)
Subjective:  Patient ID: [name], female    DOB: [birth year]  Age: 76 y.o. MRN: [id number]  CC: Diagnoses of Insomnia secondary to chronic pain, Major depressive disorder with single episode, in remission ([place]), Chronic back pain greater than 3 months duration, CKD (chronic kidney disease) stage 3, GFR 30-59 ml/min, and Mixed hyperlipidemia were pertinent to this visit.  HPI [name] presents for MEDICAITON REFILL AND FOLLOW UP On OTHER ISSUES  Chronic back pain:  LAST SEEN April 24  REFILLS ON OXYCODONE FOR April MAY June GIVEN She has not had any ER visits  And has not requested any early refills.  Her Refill history was confirmed via [place] Controlled Substance database by me today during her visit and there have been no prescriptions of controlled substances filled from any providers other than me. . However she had several days of increased pain because of the long days at [place] with husband   GAD:  Her Husband is undergoing treatment for small cell lung CA and adenoca of brain at [facility] . Chemo and XRT after craniotomy.   Doing better than expected,  Has gained 10 lbs.  Patient has increased the lexapro to 20 mg after last visit .  Children very supportive, helping out with household chores,  Meals and law maintenance .  Both children live in [place]    Patient handling the stress well,  Praying a lot, trying to stay positive.  Noticed increased spontaneous bruising without bleeding  . Sees hematology  July 26 saw nephrology and labs were done   Lots of insect bites.    Outpatient Medications Prior to Visit  Medication Sig Dispense Refill  . Ascorbic Acid (VITAMIN C) 1000 MG tablet Take 500 mg by mouth daily.     . benzonatate (TESSALON) 200 MG capsule TAKE 1 CAPSULE (200 MG TOTAL) BY MOUTH 3 (THREE) TIMES DAILY AS NEEDED FOR COUGH. 60 capsule 3  . CLARINEX 5 MG tablet TAKE 1 TABLET (5 MG TOTAL) BY MOUTH EVERY MORNING. 90 tablet 3  . Coenzyme Q10 100 MG TABS Take 1 tablet by mouth daily.      Marland Kitchen COZAAR 50 MG tablet TAKE 1 TABLET BY MOUTH TWICE A DAY 180 tablet 4  . CRESTOR 20 MG tablet TAKE 1 TABLET (20 MG TOTAL) BY MOUTH DAILY. 90 tablet 1  . cyanocobalamin (,VITAMIN B-12,) 1000 MCG/ML injection FOR USE WITH MONTHLY B12 INJECTIONS 10 mL 0  . diazepam (VALIUM) 5 MG tablet Take 1 tablet (5 mg total) by mouth daily as needed for anxiety. 30 tablet 3  . diclofenac sodium (VOLTAREN) 1 % GEL APPLY 4 GRAMS TWICE A DAY    . doxepin (SINEQUAN) 25 MG capsule TAKE 2 CAPSULES (50 MG TOTAL) BY MOUTH AT BEDTIME. 180 capsule 1  . escitalopram (LEXAPRO) 20 MG tablet TAKE 1 TABLET (10 MG TOTAL) BY MOUTH DAILY. 30 tablet 5  . fluticasone (FLONASE) 50 MCG/ACT nasal spray     . isosorbide mononitrate (IMDUR) 60 MG 24 hr tablet TAKE 1 TABLET (60 MG TOTAL) BY MOUTH DAILY. 90 tablet 3  . levothyroxine (SYNTHROID, LEVOTHROID) 137 MCG tablet Take 137 mcg by mouth daily before breakfast.    . LIDODERM 5 % PLACE 3 PATCHES ONTO SKIN DAILY AND DISCARD WITH 12 HOURS OR AS DIRECTED 90 patch 5  . montelukast (SINGULAIR) 10 MG tablet Take 1 tablet (10 mg total) by mouth at bedtime. 90 tablet 1  . Multiple Vitamin (MULTI-VITAMINS) TABS Take 1 tablet  by mouth daily.      . mupirocin ointment (BACTROBAN) 2 % Place 1 application into the nose 2 (two) times daily. 22 g 0  . NEEDLE, DISP, 23 G (BD DISP NEEDLE) 23G X 1" MISC Uses to inject b12 into muscle once monthly 12 each 1  . nitroGLYCERIN (NITROSTAT) 0.4 MG SL tablet Place 1 tablet (0.4 mg total) under the tongue every 5 (five) minutes as needed for chest pain. 50 tablet 3  . NORVASC 10 MG tablet TAKE 1 TABLET (10 MG TOTAL) BY MOUTH DAILY. 90 tablet 1  . omega-3 acid ethyl esters (LOVAZA) 1 g capsule TAKE 2 CAPSULES (2 G TOTAL) BY MOUTH 2 (TWO) TIMES DAILY. 360 capsule 3  . Prenatal Vit-Fe Fumarate-FA (PRENATAL 1 PLUS 1 PO) Take 1 tablet by mouth daily.    Marland Kitchen PREVIDENT 5000 DRY MOUTH 1.1 % GEL dental gel     . pyridOXINE (VITAMIN B-6) 100 MG tablet Take 100 mg by  mouth daily.    . sodium chloride (OCEAN) 0.65 % nasal spray Place 2 sprays into the nose as needed. Allergies      . Vitamin D, Ergocalciferol, (DRISDOL) 50000 units CAPS capsule TAKE 1 CAPSULE (50,000 UNITS TOTAL) BY MOUTH EVERY 7 (SEVEN) DAYS. 12 capsule 0  . oxyCODONE-acetaminophen (PERCOCET) 10-325 MG tablet Take 1 tablet by mouth every 8 (eight) hours as needed for pain. May refill on or after Feb 13 2017 60 tablet 0   No facility-administered medications prior to visit.     Review of Systems;  Patient denies headache, fevers, malaise, unintentional weight loss, skin rash, eye pain, sinus congestion and sinus pain, sore throat, dysphagia,  hemoptysis , cough, dyspnea, wheezing, chest pain, palpitations, orthopnea, edema, abdominal pain, nausea, melena, diarrhea, constipation, flank pain, dysuria, hematuria, urinary  Frequency, nocturia, numbness, tingling, seizures,  Focal weakness, Loss of consciousness,  Tremor, insomnia, depression, anxiety, and suicidal ideation.      Objective:  BP 136/72 (BP Location: Left Arm, Patient Position: Sitting, Cuff Size: Large)   Pulse 74   Temp 98.6 F (37 C) (Oral)   Resp 16   Ht 5' 2.25" (1.581 m)   Wt 206 lb (93.4 kg)   SpO2 97%   BMI 37.38 kg/m   BP Readings from Last 3 Encounters:  03/12/17 136/72  01/30/17 140/80  12/12/16 130/60    Wt Readings from Last 3 Encounters:  03/12/17 206 lb (93.4 kg)  12/12/16 203 lb 9.6 oz (92.4 kg)  12/04/16 203 lb 12.8 oz (92.4 kg)    General appearance: alert, cooperative and appears stated age Ears: normal TM's and external ear canals both ears Throat: lips, mucosa, and tongue normal; teeth and gums normal Neck: no adenopathy, no carotid bruit, supple, symmetrical, trachea midline and thyroid not enlarged, symmetric, no tenderness/mass/nodules Back: symmetric, no curvature. ROM normal. No CVA tenderness. Lungs: clear to auscultation bilaterally Heart: regular rate and rhythm, S1, S2 normal,  no murmur, click, rub or gallop Abdomen: soft, non-tender; bowel sounds normal; no masses,  no organomegaly Pulses: 2+ and symmetric Skin: Skin color, texture, turgor normal. No rashes or lesions Lymph nodes: Cervical, supraclavicular, and axillary nodes normal.  Lab Results  Component Value Date   HGBA1C 5.3 06/12/2016   HGBA1C 4.5 (L) 04/04/2013   HGBA1C 5.8 04/18/2011    Lab Results  Component Value Date   CREATININE 1.46 (H) 12/12/2016   CREATININE 1.16 08/07/2016   CREATININE 1.13 06/12/2016    Lab Results  Component Value  Date   WBC 8.7 04/08/2016   HGB 13.8 04/08/2016   HCT 39.9 04/08/2016   PLT 156 04/08/2016   GLUCOSE 89 12/12/2016   CHOL 136 08/07/2016   TRIG 152.0 (H) 08/07/2016   HDL 48.70 08/07/2016   LDLDIRECT 64.7 01/01/2012   LDLCALC 57 08/07/2016   ALT 17 12/12/2016   AST 26 12/12/2016   NA 137 12/12/2016   K 4.1 12/12/2016   CL 105 12/12/2016   CREATININE 1.46 (H) 12/12/2016   BUN 20 12/12/2016   CO2 22 12/12/2016   TSH 0.36 06/12/2016   INR 1.03 11/28/2013   HGBA1C 5.3 06/12/2016   MICROALBUR 16.6 (H) 06/12/2016    US Renal  Result Date: 12/13/2016 CLINICAL DATA:  Recurrent urinary tract infections EXAM: RENAL / URINARY TRACT ULTRASOUND COMPLETE COMPARISON:  CT abdomen pelvis Jan 04, 2015 FINDINGS: Right Kidney: Length: 11.9 cm. Echogenicity and renal cortical thickness are within normal limits. No mass, perinephric fluid, or hydronephrosis visualized. No sonographically demonstrable calculus or ureterectasis. Left Kidney: Length: 11.8 cm. Echogenicity and renal cortical thickness are within normal limits. No mass, perinephric fluid, or hydronephrosis visualized. There is a nonobstructing 4 mm calculus lower pole left kidney. No ureterectasis. Bladder: Normal appearance for degree of distention. IMPRESSION: Nonobstructing 4 mm calculus lower pole left kidney. Study otherwise unremarkable. Electronically Signed   By: Lowella Grip III M.D.   On:  12/13/2016 14:07    Assessment & Plan:   Problem List Items Addressed This Visit    Chronic back pain greater than 3 months duration    She has been unable  continue the gradual  weaning from chronic daily use of percocet diagnosis of cancer. due to her current high anxiety state caused by her husband's given the normal myelogram and CT that were done last October.  She will  return in 3 months . Refills given .        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  Less than 15% stenosis post procedure ([place]). Lipids are well controlled on current statin therapy. Continue  Crestor and  baby aspirin daily if she can tolerate it.    Lab Results  Component Value Date   CHOL 136 08/07/2016   HDL 48.70 08/07/2016   LDLCALC 57 08/07/2016   LDLDIRECT 64.7 01/01/2012   TRIG 152.0 (H) 08/07/2016   CHOLHDL 3 08/07/2016   Lab Results  Component Value Date   ALT 17 12/12/2016   AST 26 12/12/2016   ALKPHOS 86 12/12/2016   BILITOT 0.5 12/12/2016         Insomnia secondary to chronic pain    Managed with valium and  Doxepin  The risks and benefits of benzodiazepine use were reviewed with patient today including excessive sedation leading to respiratory depression,  impaired thinking/driving, and addiction.  Patient was advised to avoid concurrent use with alcohol, to use medication only as needed and not to share with others  .  Relevant Medications   oxyCODONE-acetaminophen (PERCOCET) 10-325 MG tablet   Major  depressive disorder with single episode, in remission ([facility])    improvedwith lexapro 20 mg daily          I have discontinued Ms. Ruggieri's oxyCODONE-acetaminophen and oxyCODONE-acetaminophen. I have also changed her oxyCODONE-acetaminophen. Additionally, I am having her maintain her sodium chloride, NEEDLE (DISP) 23 G, vitamin C, Prenatal Vit-Fe Fumarate-FA (PRENATAL 1 PLUS 1 PO), pyridOXINE, Coenzyme Q10, fluticasone, PREVIDENT 5000 DRY MOUTH, montelukast, nitroGLYCERIN, CLARINEX, levothyroxine, COZAAR, omega-3 acid ethyl esters, isosorbide mononitrate, LIDODERM, doxepin, NORVASC, diclofenac sodium, MULTI-VITAMINS, diazepam, escitalopram, Vitamin D (Ergocalciferol), cyanocobalamin, CRESTOR, benzonatate, mupirocin ointment, doxycycline, SE-TAN PLUS, fluconazole, and tobramycin-dexamethasone.  Meds ordered this encounter  Medications  . doxycycline (MONODOX) 100 MG capsule    Sig: Take 100 mg by mouth.  . FeFum-FePo-FA-B Cmp-C-Zn-Mn-Cu (SE-TAN PLUS) 162-115.2-1 MG CAPS    Sig: Take 1 capsule by mouth daily.    Refill:  10  . fluconazole (DIFLUCAN) 100 MG tablet    Sig: TAKE 1 TABLET (100 MG TOTAL) BY MOUTH DAILY. FOR 7 DAYS    Refill:  3  . tobramycin-dexamethasone (TOBRADEX) ophthalmic solution    Sig: ADMINISTER 5 DROPS INTO THE LEFT EYE EVERY FOUR (4) HOURS WHILE AWAKE. FOR 10 DAYS    Refill:  3  . DISCONTD: oxyCODONE-acetaminophen (PERCOCET) 10-325 MG tablet    Sig: Take 1 tablet by mouth every 8 (eight) hours as needed for pain. May refill on or after March 12 2017    Dispense:  60 tablet    Refill:  0  . DISCONTD: oxyCODONE-acetaminophen (PERCOCET) 10-325 MG tablet    Sig: Take 1 tablet by mouth every 8 (eight) hours as needed for pain. May refill on or after April 12 2017    Dispense:  60 tablet    Refill:  0  . oxyCODONE-acetaminophen (PERCOCET) 10-325 MG tablet    Sig: Take 1 tablet by mouth every 8 (eight) hours as needed for pain. May refill on or after May 13 2017     Dispense:  60 tablet    Refill:  0   A total of 25 minutes of face to face time was spent with patient more than half of which was spent in counselling about the above mentioned conditions  and coordination of care    Medications Discontinued During This Encounter  Medication Reason  . oxyCODONE-acetaminophen (PERCOCET) 10-325 MG tablet Reorder  . oxyCODONE-acetaminophen (PERCOCET) 10-325 MG tablet Reorder  . oxyCODONE-acetaminophen (PERCOCET) 10-325 MG tablet Reorder    Follow-up: No Follow-up on file.   Crecencio Mc, MD

## 2017-03-12 NOTE — Assessment & Plan Note (Signed)
improvedwith lexapro 20 mg daily

## 2017-03-12 NOTE — Assessment & Plan Note (Signed)
Managed with valium and  Doxepin  The risks and benefits of benzodiazepine use were reviewed with patient today including excessive sedation leading to respiratory depression,  impaired thinking/driving, and addiction.  Patient was advised to avoid concurrent use with alcohol, to use medication only as needed and not to share with others  .

## 2017-03-14 NOTE — Assessment & Plan Note (Signed)
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  Less than 15% stenosis post procedure ([place]). Lipids are well controlled on current statin therapy. Continue  Crestor and  baby aspirin daily if she can tolerate it.    Lab Results  Component Value Date   CHOL 136 08/07/2016   HDL 48.70 08/07/2016   LDLCALC 57 08/07/2016   LDLDIRECT 64.7 01/01/2012   TRIG 152.0 (H) 08/07/2016   CHOLHDL 3 08/07/2016   Lab Results  Component Value Date   ALT 17 12/12/2016   AST 26 12/12/2016   ALKPHOS 86 12/12/2016   BILITOT 0.5 12/12/2016

## 2017-03-14 NOTE — Assessment & Plan Note (Signed)
Renal function is stable,  She is avoiding NSAIDs and diuretics  Lab Results  Component Value Date   CREATININE 1.46 (H) 12/12/2016   Lab Results  Component Value Date   NA 137 12/12/2016   K 4.1 12/12/2016   CL 105 12/12/2016   CO2 22 12/12/2016  '

## 2017-03-14 NOTE — Assessment & Plan Note (Signed)
She has been unable  continue the gradual  weaning from chronic daily use of percocet diagnosis of cancer. due to her current high anxiety state caused by her husband's given the normal myelogram and CT that were done last October.  She will  return in 3 months . Refills given .

## 2017-03-15 ENCOUNTER — Inpatient Hospital Stay: Payer: Federal, State, Local not specified - PPO

## 2017-03-15 ENCOUNTER — Inpatient Hospital Stay: Payer: Federal, State, Local not specified - PPO | Attending: Oncology | Admitting: Oncology

## 2017-03-15 ENCOUNTER — Encounter: Payer: Self-pay | Admitting: Oncology

## 2017-03-15 VITALS — BP 146/77 | HR 65 | Temp 97.4°F | Wt 206.1 lb

## 2017-03-15 DIAGNOSIS — E7800 Pure hypercholesterolemia, unspecified: Secondary | ICD-10-CM | POA: Diagnosis not present

## 2017-03-15 DIAGNOSIS — F17210 Nicotine dependence, cigarettes, uncomplicated: Secondary | ICD-10-CM | POA: Diagnosis not present

## 2017-03-15 DIAGNOSIS — G4733 Obstructive sleep apnea (adult) (pediatric): Secondary | ICD-10-CM | POA: Insufficient documentation

## 2017-03-15 DIAGNOSIS — R778 Other specified abnormalities of plasma proteins: Secondary | ICD-10-CM

## 2017-03-15 DIAGNOSIS — R779 Abnormality of plasma protein, unspecified: Secondary | ICD-10-CM

## 2017-03-15 DIAGNOSIS — Z6830 Body mass index (BMI) 30.0-30.9, adult: Secondary | ICD-10-CM | POA: Insufficient documentation

## 2017-03-15 DIAGNOSIS — I10 Essential (primary) hypertension: Secondary | ICD-10-CM | POA: Insufficient documentation

## 2017-03-15 DIAGNOSIS — E669 Obesity, unspecified: Secondary | ICD-10-CM | POA: Diagnosis not present

## 2017-03-15 DIAGNOSIS — D649 Anemia, unspecified: Secondary | ICD-10-CM | POA: Diagnosis not present

## 2017-03-15 DIAGNOSIS — I700 Atherosclerosis of aorta: Secondary | ICD-10-CM | POA: Diagnosis not present

## 2017-03-15 DIAGNOSIS — G4701 Insomnia due to medical condition: Secondary | ICD-10-CM | POA: Diagnosis not present

## 2017-03-15 DIAGNOSIS — Z79899 Other long term (current) drug therapy: Secondary | ICD-10-CM | POA: Insufficient documentation

## 2017-03-15 DIAGNOSIS — E039 Hypothyroidism, unspecified: Secondary | ICD-10-CM | POA: Diagnosis not present

## 2017-03-15 DIAGNOSIS — J45909 Unspecified asthma, uncomplicated: Secondary | ICD-10-CM | POA: Diagnosis not present

## 2017-03-15 DIAGNOSIS — I509 Heart failure, unspecified: Secondary | ICD-10-CM | POA: Insufficient documentation

## 2017-03-15 DIAGNOSIS — I2510 Atherosclerotic heart disease of native coronary artery without angina pectoris: Secondary | ICD-10-CM | POA: Insufficient documentation

## 2017-03-15 LAB — CBC WITH DIFFERENTIAL/PLATELET
BASOS ABS: 0.1 10*3/uL (ref 0–0.1)
Basophils Relative: 1 %
EOS ABS: 0.2 10*3/uL (ref 0–0.7)
Eosinophils Relative: 2 %
HCT: 39.2 % (ref 35.0–47.0)
HEMOGLOBIN: 13.4 g/dL (ref 12.0–16.0)
Lymphocytes Relative: 43 %
Lymphs Abs: 3.2 10*3/uL (ref 1.0–3.6)
MCH: 32.3 pg (ref 26.0–34.0)
MCHC: 34.1 g/dL (ref 32.0–36.0)
MCV: 94.9 fL (ref 80.0–100.0)
MONOS PCT: 10 %
Monocytes Absolute: 0.7 10*3/uL (ref 0.2–0.9)
NEUTROS PCT: 44 %
Neutro Abs: 3.3 10*3/uL (ref 1.4–6.5)
Platelets: 153 10*3/uL (ref 150–440)
RBC: 4.13 MIL/uL (ref 3.80–5.20)
RDW: 12.1 % (ref 11.5–14.5)
WBC: 7.4 10*3/uL (ref 3.6–11.0)

## 2017-03-15 LAB — IRON AND TIBC
Iron: 91 ug/dL (ref 28–170)
SATURATION RATIOS: 38 % — AB (ref 10.4–31.8)
TIBC: 239 ug/dL — AB (ref 250–450)
UIBC: 148 ug/dL

## 2017-03-15 LAB — FERRITIN: Ferritin: 270 ng/mL (ref 11–307)

## 2017-03-15 LAB — COMPREHENSIVE METABOLIC PANEL
ALK PHOS: 119 U/L (ref 38–126)
ALT: 26 U/L (ref 14–54)
ANION GAP: 7 (ref 5–15)
AST: 26 U/L (ref 15–41)
Albumin: 3.4 g/dL — ABNORMAL LOW (ref 3.5–5.0)
BUN: 36 mg/dL — ABNORMAL HIGH (ref 6–20)
CO2: 24 mmol/L (ref 22–32)
Calcium: 9.7 mg/dL (ref 8.9–10.3)
Chloride: 103 mmol/L (ref 101–111)
Creatinine, Ser: 1.66 mg/dL — ABNORMAL HIGH (ref 0.44–1.00)
GFR calc Af Amer: 34 mL/min — ABNORMAL LOW (ref >60)
GFR calc non Af Amer: 29 mL/min — ABNORMAL LOW (ref >60)
GLUCOSE: 85 mg/dL (ref 65–99)
POTASSIUM: 3.8 mmol/L (ref 3.5–5.1)
Sodium: 134 mmol/L — ABNORMAL LOW (ref 135–145)
Total Bilirubin: 0.8 mg/dL (ref 0.3–1.2)
Total Protein: 6.3 g/dL — ABNORMAL LOW (ref 6.5–8.1)

## 2017-03-15 LAB — FOLATE: FOLATE: 94.4 ng/mL (ref >5.9)

## 2017-03-15 LAB — VITAMIN B12: VITAMIN B 12: 871 pg/mL (ref 180–914)

## 2017-03-15 NOTE — Progress Notes (Signed)
Hematology/Oncology Consult note [facility] Telephone:([phone] Fax:([phone]) [phone]  Patient Care Team: Crecencio Mc, MD as PCP - General (Internal Medicine) Gavin Pound, MD as Consulting Physician (Rheumatology) Christene Lye, MD (General Surgery) Crecencio Mc, MD (Internal Medicine)   Name of the patient: [name]  [id number]  [birth year]    Reason for referral- abnormal spep   Referring physician- Dr. Holley Raring  Date of visit: 03/15/17   History of presenting illness- patient is a 76 year old female with a past medical history significant for hypothyroidism, hypertension, history of possible SND, obstructive sleep apnea multiple back surgeries and knee replacement who sees Dr. Holley Raring for stage III chronic kidney disease. Her creatinine fluctuating between 1.1-1.4. She recently had workup chronic kidney disease. Recent CBC from 02/04/2017 showed white count of 6.4, H&H of 12.7/38.1 and a platelet count of 170. SPEP showed an abnormal M spike of 0.1 g. HIV and hepatitis BE testing was negative. LFTs were within normal limits. BUN was slightly elevated at 28 and creatinine of 1.27. Random urine protein electrophoresis did not reveal any M spike  Patient lives with her husband who was been treated for lung cancer. Patient admits to being under a lot of stress.  ECOG PS- 1  Pain scale- 6- chronic back pain   Review of systems- Review of Systems  Constitutional: Positive for malaise/fatigue. Negative for chills, fever and weight loss.  HENT: Negative for congestion, ear discharge and nosebleeds.   Eyes: Negative for blurred vision.  Respiratory: Negative for cough, hemoptysis, sputum production, shortness of breath and wheezing.   Cardiovascular: Negative for chest pain, palpitations, orthopnea and claudication.  Gastrointestinal: Negative for abdominal pain, blood in stool, constipation, diarrhea, heartburn, melena, nausea and vomiting.    Genitourinary: Negative for dysuria, flank pain, frequency, hematuria and urgency.  Musculoskeletal: Positive for back pain and joint pain. Negative for myalgias.  Skin: Negative for rash.  Neurological: Negative for dizziness, tingling, focal weakness, seizures, weakness and headaches.  Endo/Heme/Allergies: Does not bruise/bleed easily.  Psychiatric/Behavioral: Negative for depression and suicidal ideas. The patient does not have insomnia.     Allergies  Allergen Reactions  . Penicillins Anaphylaxis    Septra ANTI-INFECTIVE AGENTS-MISC..WELTS,ITCHING  . Penicillins Hives  . Propofol Other (See Comments)    O2 drops   . Propranolol     chf  . Triamcinolone Acetonide Hives  . Adhesive [Tape] Rash  . Kenalog [Triamcinolone Acetonide] Itching and Rash  . Septra [Bactrim] Swelling and Rash  . Sulfur Rash    Patient Active Problem List   Diagnosis Date Noted  . Abdominal pain, RLQ 09/16/2016  . Hypercalcemia 06/13/2016  . Proteinuria 06/13/2016  . Chest pain 03/31/2016  . Chronic narcotic dependence ([place]) 01/11/2016  . Pain in joint, ankle and foot 09/07/2015  . Candidiasis of skin 09/06/2015  . Status post lumbar spinal fusion 06/01/2015  . Major depressive disorder with single episode, in remission ([place]) 06/01/2015  . Chronic rhinitis 05/24/2015  . CAP (community acquired pneumonia) 02/28/2015  . 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 CKD (chronic kidney disease) stage 3, GFR 30-59 ml/min 01/07/2015  . Coronary atherosclerosis 01/05/2015  . Aortic atherosclerosis ([place]) 01/05/2015  . Screening for osteoporosis 08/22/2014  . Stress incontinence 04/25/2014  . 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 Murmur 12/31/2013  . Bradycardia 12/31/2013  . Nocturnal hypoxia 10/09/2013  . Shortness of breath 09/23/2013  . History of bacterial pneumonia 09/01/2013  . History  of tobacco abuse 09/01/2013  . Hyperlipidemia 03/28/2013  .  Preoperative clearance 03/28/2013  . Encounter for other preprocedural examination 03/28/2013  . Routine general medical examination at a health care facility 03/27/2013  . OSA on CPAP 03/27/2013  . Insomnia secondary to chronic pain 03/13/2013  . Allergic rhinitis 03/13/2013  . 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 Congestive heart failure ([place]) 05/15/2012  . Clinical depression 05/15/2012  . Hearing loss of left ear 05/15/2012  . Adult hypothyroidism 05/15/2012  . Systemic lupus erythematosus ([place]) 05/15/2012  . Obesity (BMI 30-39.9) 03/10/2012  . Monoclonal gammopathy of undetermined significance 03/10/2012  . Anemia 01/01/2012  . Hypothyroidism 01/01/2012  . Osteoarthritis of right knee 07/12/2011  . Brain aneurysm 04/18/2011  . Chronic back pain greater than 3 months duration 04/18/2011  . Breast cancer screening 04/18/2011  . Encounter for other screening for malignant neoplasm of breast 04/18/2011  . Hypertension 04/07/2011     Past Medical History:  Diagnosis Date  . Asthma 06/2010   depending on what around  . Blood transfusion 2009   back surgery  . Brain aneurysm    S/P RESECTION  . CHF (congestive heart failure) ([place]) 05/2010   after receiving Propofol  . Chronic sinusitis   . Complication of anesthesia 05/30/2010   from colonoscopy-had Propofol-had a  . Complication of anesthesia    reaction-went into asthma-pneumonia,-  . Complication of anesthesia    then CHF and to ICU  . Degenerative disc disease    HERNITAED DISC BY MRI ON 07/07;S/P LAMINECTOMY,RID REPLACEMENT  . Discoid lupus erythematosus eyelid    NEGATIVE SEROLOGIC MARKERS  . Dysrhythmia    PVC's occ.  Marland Kitchen Headache(784.0)    migraines occ.  Marland Kitchen Hypercholesterolemia   . Hypertension   . Hypothyroidism    takes Synthroid  . Pneumonia 05/30/2010   after receiving Propofol  . Renal insufficiency    Patient states she has been told she has Stage I kidney disease  .  Sleep apnea    uses CPAP-8.4-oxygen suppliment  . Systemic lupus erythematosus ([place]) 05/15/2012     Past Surgical History:  Procedure Laterality Date  . ABDOMINAL HYSTERECTOMY  1988   uterus only  . BACK SURGERY  1968,1988,2002,2009,2010   4 laminectomies and fusions  . BRAIN SURGERY  2005   coiling of cranial aneurysum  . BREAST EXCISIONAL BIOPSY Right 30+ YRS AGO   NEG  . COLONOSCOPY  2008  . COLONOSCOPY WITH PROPOFOL N/A 01/13/2015   Procedure: COLONOSCOPY WITH PROPOFOL;  Surgeon: Christene Lye, MD;  Location: [facility];  Service: Endoscopy;  Laterality: N/A;  . COLONOSCOPY WITH PROPOFOL N/A 03/24/2015   Procedure: COLONOSCOPY WITH PROPOFOL;  Surgeon: Christene Lye, MD;  Location: [facility];  Service: Endoscopy;  Laterality: N/A;  . COSMETIC SURGERY    . [place]   left foot  . EYE SURGERY     both cataracts -[place] 7/14  . FOOT ARTHRODESIS Right 05/22/2013   Procedure: RIGHT HALLUX METATARSAL PHALANGEAL JOINT ARTHRODESIS ;  Surgeon: Wylene Simmer, MD;  Location: [facility];  Service: Orthopedics;  Laterality: Right;  . JOINT REPLACEMENT  2013   Right knee,  [place]  . lipomas  1975   3 from right breast  . NASAL SINUS SURGERY  1977-2008   6 surgeries all together  . PERIPHERAL VASCULAR CATHETERIZATION N/A 01/25/2015   Procedure: Visceral Angiography;  Surgeon: Algernon Huxley, MD;  Location: [facility];  Service: Cardiovascular;  Laterality: N/A;  . RIGHT HEART CATHETERIZATION N/A 11/28/2013   Procedure: RIGHT HEART CATH;  Surgeon: Jolaine Artist, MD;  Location: [facility] CATH LAB;  Service: Cardiovascular;  Laterality: N/A;  . TONSILLECTOMY  1963  . TOTAL KNEE ARTHROPLASTY  07/10/2011   Procedure: TOTAL KNEE ARTHROPLASTY;  Surgeon: Gearlean Alf;  Location: [facility];  Service: Orthopedics;  Laterality: Right;  . TYMPANIC MEMBRANE REPAIR  2009-last one   x5 times    Social History   Social History  . Marital  status: Married    Spouse name: N/A  . Number of children: N/A  . Years of education: N/A   Occupational History  . Not on file.   Social History Main Topics  . Smoking status: Current Some Day Smoker    Packs/day: 0.50    Years: 40.00    Types: Cigarettes    Last attempt to quit: 07/24/2013  . Smokeless tobacco: Never Used     Comment: Has restarted use since husband was diagnosed with Cancer  . Alcohol use Yes     Comment: occasional  . Drug use: No  . Sexual activity: Not Currently   Other Topics Concern  . Not on file   Social History Narrative   Light exercise   LIVES WITH SPOUSE           Family History  Problem Relation Age of Onset  . Heart disease Mother   . Stroke Mother   . Alcohol abuse Other   . Cancer Sister        leukemia  . Cancer Brother        lung Ca, asbestos  . Breast cancer Paternal Aunt   . Prostate cancer Neg Hx   . Kidney cancer Neg Hx   . Bladder Cancer Neg Hx      Current Outpatient Prescriptions:  .  Ascorbic Acid (VITAMIN C) 1000 MG tablet, Take 500 mg by mouth daily. , Disp: , Rfl:  .  benzonatate (TESSALON) 200 MG capsule, TAKE 1 CAPSULE (200 MG TOTAL) BY MOUTH 3 (THREE) TIMES DAILY AS NEEDED FOR COUGH., Disp: 60 capsule, Rfl: 3 .  CLARINEX 5 MG tablet, TAKE 1 TABLET (5 MG TOTAL) BY MOUTH EVERY MORNING., Disp: 90 tablet, Rfl: 3 .  Coenzyme Q10 100 MG TABS, Take 1 tablet by mouth daily., Disp: , Rfl:  .  COZAAR 50 MG tablet, TAKE 1 TABLET BY MOUTH TWICE A DAY, Disp: 180 tablet, Rfl: 4 .  CRESTOR 20 MG tablet, TAKE 1 TABLET (20 MG TOTAL) BY MOUTH DAILY., Disp: 90 tablet, Rfl: 1 .  cyanocobalamin (,VITAMIN B-12,) 1000 MCG/ML injection, FOR USE WITH MONTHLY B12 INJECTIONS, Disp: 10 mL, Rfl: 0 .  diazepam (VALIUM) 5 MG tablet, Take 1 tablet (5 mg total) by mouth daily as needed for anxiety., Disp: 30 tablet, Rfl: 3 .  diclofenac sodium (VOLTAREN) 1 % GEL, APPLY 4 GRAMS TWICE A DAY, Disp: , Rfl:  .  doxepin (SINEQUAN) 25 MG capsule,  TAKE 2 CAPSULES (50 MG TOTAL) BY MOUTH AT BEDTIME., Disp: 180 capsule, Rfl: 1 .  doxycycline (MONODOX) 100 MG capsule, Take 100 mg by mouth., Disp: , Rfl:  .  escitalopram (LEXAPRO) 20 MG tablet, TAKE 1 TABLET (10 MG TOTAL) BY MOUTH DAILY., Disp: 30 tablet, Rfl: 5 .  FeFum-FePo-FA-B Cmp-C-Zn-Mn-Cu (SE-TAN PLUS) 162-115.2-1 MG CAPS, Take 1 capsule by mouth daily., Disp: , Rfl: 10 .  fluticasone (FLONASE) 50 MCG/ACT nasal spray, , Disp: , Rfl:  .  isosorbide mononitrate (IMDUR) 60 MG 24 hr tablet, TAKE 1 TABLET (60 MG TOTAL) BY MOUTH DAILY., Disp: 90 tablet, Rfl: 3 .  levothyroxine (SYNTHROID, LEVOTHROID) 137 MCG tablet, Take 137 mcg by mouth daily before breakfast., Disp: , Rfl:  .  LIDODERM 5 %, PLACE 3 PATCHES ONTO SKIN DAILY AND DISCARD WITH 12 HOURS OR AS DIRECTED, Disp: 90 patch, Rfl: 5 .  montelukast (SINGULAIR) 10 MG tablet, Take 1 tablet (10 mg total) by mouth at bedtime., Disp: 90 tablet, Rfl: 1 .  Multiple Vitamin (MULTI-VITAMINS) TABS, Take 1 tablet by mouth daily.  , Disp: , Rfl:  .  mupirocin ointment (BACTROBAN) 2 %, Place 1 application into the nose 2 (two) times daily., Disp: 22 g, Rfl: 0 .  NEEDLE, DISP, 23 G (BD DISP NEEDLE) 23G X 1" MISC, Uses to inject b12 into muscle once monthly, Disp: 12 each, Rfl: 1 .  nitroGLYCERIN (NITROSTAT) 0.4 MG SL tablet, Place 1 tablet (0.4 mg total) under the tongue every 5 (five) minutes as needed for chest pain., Disp: 50 tablet, Rfl: 3 .  NORVASC 10 MG tablet, TAKE 1 TABLET (10 MG TOTAL) BY MOUTH DAILY., Disp: 90 tablet, Rfl: 1 .  omega-3 acid ethyl esters (LOVAZA) 1 g capsule, TAKE 2 CAPSULES (2 G TOTAL) BY MOUTH 2 (TWO) TIMES DAILY., Disp: 360 capsule, Rfl: 3 .  oxyCODONE-acetaminophen (PERCOCET) 10-325 MG tablet, Take 1 tablet by mouth every 8 (eight) hours as needed for pain. May refill on or after May 13 2017, Disp: 60 tablet, Rfl: 0 .  Prenatal Vit-Fe Fumarate-FA (PRENATAL 1 PLUS 1 PO), Take 1 tablet by mouth daily., Disp: , Rfl:  .   PREVIDENT 5000 DRY MOUTH 1.1 % GEL dental gel, , Disp: , Rfl:  .  pyridOXINE (VITAMIN B-6) 100 MG tablet, Take 100 mg by mouth daily., Disp: , Rfl:  .  sodium chloride (OCEAN) 0.65 % nasal spray, Place 2 sprays into the nose as needed. Allergies  , Disp: , Rfl:  .  tobramycin-dexamethasone (TOBRADEX) ophthalmic solution, ADMINISTER 5 DROPS INTO THE LEFT EYE EVERY FOUR (4) HOURS WHILE AWAKE. FOR 10 DAYS, Disp: , Rfl: 3 .  Vitamin D, Ergocalciferol, (DRISDOL) 50000 units CAPS capsule, TAKE 1 CAPSULE (50,000 UNITS TOTAL) BY MOUTH EVERY 7 (SEVEN) DAYS., Disp: 12 capsule, Rfl: 0   Physical exam:  Vitals:   03/15/17 1038  BP: (!) 146/77  Pulse: 65  Temp: (!) 97.4 F (36.3 C)  TempSrc: Tympanic  Weight: 206 lb 1.6 oz (93.5 kg)   Physical Exam  Constitutional: She is oriented to person, place, and time and well-developed, well-nourished, and in no distress.  HENT:  Head: Normocephalic and atraumatic.  Eyes: Pupils are equal, round, and reactive to light. EOM are normal.  Neck: Normal range of motion.  Cardiovascular: Normal rate, regular rhythm and normal heart sounds.   Pulmonary/Chest: Effort normal and breath sounds normal.  Abdominal: Soft. Bowel sounds are normal.  Scattered bruising over abdomen  Neurological: She is alert and oriented to person, place, and time.  Skin: Skin is warm and dry.  Bright red erythematous macular pinpoint rash over extremities       CMP Latest Ref Rng & Units 12/19/2016  Glucose 70 - 99 mg/dL -  BUN 6 - 23 mg/dL -  Creatinine 0.40 - 1.20 mg/dL -  Sodium 135 - 145 mEq/L -  Potassium 3.5 - 5.1 mEq/L -  Chloride 96 - 112 mEq/L -  CO2 19 - 32 mEq/L -  Calcium 8.6 - 10.4 mg/dL 9.6  Total Protein 6.0 - 8.3 g/dL -  Total Bilirubin 0.2 - 1.2 mg/dL -  Alkaline Phos 39 - 117 U/L -  AST 0 - 37 U/L -  ALT 0 - 35 U/L -   CBC Latest Ref Rng & Units 04/08/2016  WBC 3.6 - 11.0 K/uL 8.7  Hemoglobin 12.0 - 16.0 g/dL 13.8  Hematocrit 35.0 - 47.0 % 39.9    Platelets 150 - 440 K/uL 156    Assessment and plan- Patient is a 76 y.o. female referred for abnormal spep  Patient has a very small amount of M protein of 0.1 gm likely MGUS. This does not explain her anemia. I will do a full myeloma work up including cbc with diff, cmp, myeloma panel, iron studies, b12, folate, tsh, free light chains, 24 hour urine protein electrophoresis and urine IFE and urine free light chains. I will see her back in 3-4 weeks time and discuss her results of bloodwork   Thank you for this kind referral and the opportunity to participate in the care of this patient   Visit Diagnosis 1. Abnormal SPEP     Dr. Randa Evens, MD, MPH [facility] Pager- [phone] 03/15/2017

## 2017-03-15 NOTE — Addendum Note (Signed)
Addended by: Luella Cook on: 03/15/2017 02:31 PM   Modules accepted: Orders

## 2017-03-16 ENCOUNTER — Other Ambulatory Visit: Payer: Self-pay | Admitting: Internal Medicine

## 2017-03-16 LAB — KAPPA/LAMBDA LIGHT CHAINS
KAPPA, LAMDA LIGHT CHAIN RATIO: 1.03 (ref 0.26–1.65)
Kappa free light chain: 27.6 mg/L — ABNORMAL HIGH (ref 3.3–19.4)
LAMDA FREE LIGHT CHAINS: 26.7 mg/L — AB (ref 5.7–26.3)

## 2017-03-19 DIAGNOSIS — R779 Abnormality of plasma protein, unspecified: Secondary | ICD-10-CM | POA: Diagnosis not present

## 2017-03-19 LAB — MULTIPLE MYELOMA PANEL, SERUM
ALBUMIN SERPL ELPH-MCNC: 3.3 g/dL (ref 2.9–4.4)
ALBUMIN/GLOB SERPL: 1.3 (ref 0.7–1.7)
ALPHA 1: 0.3 g/dL (ref 0.0–0.4)
Alpha2 Glob SerPl Elph-Mcnc: 0.9 g/dL (ref 0.4–1.0)
B-GLOBULIN SERPL ELPH-MCNC: 0.8 g/dL (ref 0.7–1.3)
GAMMA GLOB SERPL ELPH-MCNC: 0.7 g/dL (ref 0.4–1.8)
GLOBULIN, TOTAL: 2.7 g/dL (ref 2.2–3.9)
IGA: 86 mg/dL (ref 64–422)
IgG (Immunoglobin G), Serum: 454 mg/dL — ABNORMAL LOW (ref 700–1600)
IgM, Serum: 242 mg/dL — ABNORMAL HIGH (ref 26–217)
M PROTEIN SERPL ELPH-MCNC: 0.1 g/dL — AB
Total Protein ELP: 6 g/dL (ref 6.0–8.5)

## 2017-03-21 ENCOUNTER — Other Ambulatory Visit: Payer: Self-pay | Admitting: *Deleted

## 2017-03-21 DIAGNOSIS — R778 Other specified abnormalities of plasma proteins: Secondary | ICD-10-CM

## 2017-03-22 ENCOUNTER — Other Ambulatory Visit: Payer: Self-pay | Admitting: Internal Medicine

## 2017-03-22 LAB — IFE+PROTEIN ELECTRO, 24-HR UR
% BETA, URINE: 10 %
ALPHA 1 URINE: 6.9 %
ALPHA 2 UR: 7.2 %
Albumin, U: 70.3 %
GAMMA GLOBULIN URINE: 5.6 %
TOTAL VOLUME: 2440
Total Protein, Urine-Ur/day: 2494 mg/24 hr — ABNORMAL HIGH (ref 30–150)
Total Protein, Urine: 102.2 mg/dL

## 2017-03-25 ENCOUNTER — Other Ambulatory Visit: Payer: Self-pay | Admitting: Internal Medicine

## 2017-03-28 ENCOUNTER — Ambulatory Visit: Payer: Federal, State, Local not specified - PPO | Admitting: Internal Medicine

## 2017-03-30 ENCOUNTER — Other Ambulatory Visit: Payer: Self-pay | Admitting: Internal Medicine

## 2017-04-03 ENCOUNTER — Telehealth: Payer: Self-pay | Admitting: *Deleted

## 2017-04-03 ENCOUNTER — Ambulatory Visit (INDEPENDENT_AMBULATORY_CARE_PROVIDER_SITE_OTHER): Payer: Federal, State, Local not specified - PPO | Admitting: *Deleted

## 2017-04-03 DIAGNOSIS — R300 Dysuria: Secondary | ICD-10-CM

## 2017-04-03 LAB — POCT URINALYSIS DIPSTICK
Bilirubin, UA: NEGATIVE
Glucose, UA: 100
Ketones, UA: NEGATIVE
NITRITE UA: POSITIVE
PH UA: 5.5 (ref 5.0–8.0)
SPEC GRAV UA: 1.015 (ref 1.010–1.025)
UROBILINOGEN UA: 0.2 U/dL

## 2017-04-03 NOTE — Progress Notes (Addendum)
Patient having burning with urination, chills , frequency and urgency X 24 hours . Taking OTC AZO for discomfort. . Patient BP at visit 168/82 pulse 83 temp 98.3 02 sat @ 96.   I have reviewed the above information and agree with above.   Deborra Medina, MD

## 2017-04-03 NOTE — Telephone Encounter (Signed)
Pt would like to come in later this  afternoon for a UTi, Pt has symptoms of  frequent urination and  burning during urination  Pt contact [phone]

## 2017-04-03 NOTE — Telephone Encounter (Signed)
Yes,  Tell her to use azo while we wait for results .  Thank you for ordering

## 2017-04-03 NOTE — Telephone Encounter (Signed)
Patient has frequency , urgency with burning with urination x 24 hours, schedule appropriate labs and added to nurse schedule for today ?

## 2017-04-04 LAB — URINALYSIS, MICROSCOPIC ONLY: RBC / HPF: NONE SEEN (ref 0–?)

## 2017-04-04 LAB — URINE CULTURE: Organism ID, Bacteria: NO GROWTH

## 2017-04-05 ENCOUNTER — Encounter: Payer: Self-pay | Admitting: Internal Medicine

## 2017-04-09 NOTE — Telephone Encounter (Signed)
Mailed unread message to the patient.

## 2017-04-12 ENCOUNTER — Other Ambulatory Visit: Payer: Self-pay | Admitting: Internal Medicine

## 2017-04-14 ENCOUNTER — Other Ambulatory Visit: Payer: Self-pay | Admitting: Internal Medicine

## 2017-04-17 ENCOUNTER — Inpatient Hospital Stay: Payer: Federal, State, Local not specified - PPO | Admitting: Oncology

## 2017-04-19 ENCOUNTER — Telehealth: Payer: Self-pay | Admitting: *Deleted

## 2017-04-19 DIAGNOSIS — D472 Monoclonal gammopathy: Secondary | ICD-10-CM

## 2017-04-19 NOTE — Telephone Encounter (Signed)
Got in touch with pt. Her home phone has been busy every time  I call and her cell phone has full memory and can't leave messages.  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  She is busy caring for her husband who has cancer and he just got out of hosp. 2 days ago.  He will be getting chemo. I told her we would like her to come and see [place] in 3 months and I will send message to scheduler to make an appt. For md and labs.  If it gets close to that time and she knows she will not be able to make it due to her husband's care she can call and we can make the schedule work around hers. She is agreeable to the appt.

## 2017-04-19 NOTE — Telephone Encounter (Signed)
-----  Message from Sindy Guadeloupe, MD sent at 04/18/2017  9:45 PM EDT ----- [name] can call her and tell her that so far she has very small amount of abnormal protein in her blood which is not concerning for multiple myeloma. 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 She can see me back in 3 months with bloodwork- cbc, cmp, free light chains, myeloma panel  Please inform Dr. Holley Raring that she had significant proteinuria in our sample in 24 hours but no monoclonal protein. He rproteinuria likely due to non myeloma reasons like diabetes etc which needs to be followed by nephrology.  Thanks, Astrid Divine

## 2017-04-27 ENCOUNTER — Telehealth: Payer: Self-pay | Admitting: Internal Medicine

## 2017-04-27 NOTE — Telephone Encounter (Signed)
Last OV was 03/12/17, last refill was 12/12/16 #30 with 3 refills, please advise. thanks

## 2017-04-27 NOTE — Telephone Encounter (Signed)
refilled 

## 2017-04-30 ENCOUNTER — Other Ambulatory Visit: Payer: Self-pay | Admitting: Internal Medicine

## 2017-04-30 NOTE — Telephone Encounter (Signed)
Pt called requesting this medication. Pt states that she spoke with the pharmacy and they did not have it. Please advise, thank you!

## 2017-05-01 NOTE — Telephone Encounter (Signed)
Spoke with pt and she stated that she is going to pick up rx today.

## 2017-05-11 ENCOUNTER — Other Ambulatory Visit: Payer: Self-pay | Admitting: Internal Medicine

## 2017-05-11 ENCOUNTER — Inpatient Hospital Stay: Payer: Federal, State, Local not specified - PPO

## 2017-05-11 ENCOUNTER — Inpatient Hospital Stay: Payer: Federal, State, Local not specified - PPO | Admitting: Oncology

## 2017-05-16 ENCOUNTER — Telehealth: Payer: Self-pay | Admitting: Oncology

## 2017-05-16 ENCOUNTER — Other Ambulatory Visit: Payer: Self-pay | Admitting: Internal Medicine

## 2017-05-16 NOTE — Telephone Encounter (Signed)
See care note  

## 2017-05-16 NOTE — Telephone Encounter (Signed)
Called patient to schedule and confirm follow up appointments with labs for November.  2nd attempt to reschdeule patient for Labs and follow up with Dr Janese Banks. Patient is very agitated and hostile and is refusing a following up appointment. Patient also stated that she is not ill, neither does she need a follow up visit and NOT to call her again. 05/16/17 MF

## 2017-05-16 NOTE — Telephone Encounter (Signed)
[name] is aware of patient's decision.

## 2017-05-17 NOTE — Telephone Encounter (Signed)
See care note  

## 2017-05-21 NOTE — Telephone Encounter (Signed)
See care note  

## 2017-05-23 ENCOUNTER — Telehealth: Payer: Self-pay | Admitting: Internal Medicine

## 2017-05-23 NOTE — Telephone Encounter (Signed)
Patient notified and voiced understanding,

## 2017-05-23 NOTE — Telephone Encounter (Signed)
[name] is not contagious unless you have no immune system.  Would have to be seen

## 2017-05-23 NOTE — Telephone Encounter (Signed)
Patient toggue is coated white and has patches in mouth she is at hospital with husband,  Patient believes she caught it from kissing her husband whom has current case of thrush. Patient if you would call in something for her.

## 2017-05-23 NOTE — Telephone Encounter (Signed)
Pt is requesting that Juliann Pulse call her. Pt thinks she has thrush, her husband has it. Please call her at [phone].

## 2017-05-28 ENCOUNTER — Telehealth: Payer: Self-pay | Admitting: Internal Medicine

## 2017-05-28 NOTE — Telephone Encounter (Signed)
Pt called and stated that she has another UTI. Can she come in? Please advise, thank you!  Call pt @ [phone]

## 2017-05-28 NOTE — Telephone Encounter (Signed)
Patient called complaining of burning with urination, bladder pain, frequency, etc. Advised patient that we did not have any appointments in our office, offered appointment at [place]. Patient declined and stated she would rather go to urgent care.

## 2017-06-02 ENCOUNTER — Encounter: Payer: Self-pay | Admitting: Emergency Medicine

## 2017-06-02 DIAGNOSIS — M3210 Systemic lupus erythematosus, organ or system involvement unspecified: Secondary | ICD-10-CM | POA: Diagnosis not present

## 2017-06-02 DIAGNOSIS — F17210 Nicotine dependence, cigarettes, uncomplicated: Secondary | ICD-10-CM | POA: Diagnosis not present

## 2017-06-02 DIAGNOSIS — J449 Chronic obstructive pulmonary disease, unspecified: Secondary | ICD-10-CM | POA: Insufficient documentation

## 2017-06-02 DIAGNOSIS — E039 Hypothyroidism, unspecified: Secondary | ICD-10-CM | POA: Insufficient documentation

## 2017-06-02 DIAGNOSIS — Z79899 Other long term (current) drug therapy: Secondary | ICD-10-CM | POA: Diagnosis not present

## 2017-06-02 DIAGNOSIS — I130 Hypertensive heart and chronic kidney disease with heart failure and stage 1 through stage 4 chronic kidney disease, or unspecified chronic kidney disease: Secondary | ICD-10-CM | POA: Diagnosis not present

## 2017-06-02 DIAGNOSIS — N183 Chronic kidney disease, stage 3 (moderate): Secondary | ICD-10-CM | POA: Diagnosis not present

## 2017-06-02 DIAGNOSIS — R05 Cough: Secondary | ICD-10-CM | POA: Diagnosis present

## 2017-06-02 DIAGNOSIS — I5032 Chronic diastolic (congestive) heart failure: Secondary | ICD-10-CM | POA: Insufficient documentation

## 2017-06-02 DIAGNOSIS — J209 Acute bronchitis, unspecified: Secondary | ICD-10-CM | POA: Diagnosis not present

## 2017-06-02 DIAGNOSIS — E7800 Pure hypercholesterolemia, unspecified: Secondary | ICD-10-CM | POA: Insufficient documentation

## 2017-06-02 NOTE — ED Triage Notes (Signed)
Patient with complaint of cough and fever that started tonight. Patient reports fever of 101.5 at home tonight. Patient also complaint of headache and generalized ashiness.

## 2017-06-03 ENCOUNTER — Emergency Department: Payer: Federal, State, Local not specified - PPO

## 2017-06-03 ENCOUNTER — Emergency Department
Admission: EM | Admit: 2017-06-03 | Discharge: 2017-06-03 | Disposition: A | Discharge status: Home / Self Care | Payer: Federal, State, Local not specified - PPO | Attending: Emergency Medicine | Admitting: Emergency Medicine

## 2017-06-03 DIAGNOSIS — R059 Cough, unspecified: Secondary | ICD-10-CM

## 2017-06-03 DIAGNOSIS — R05 Cough: Secondary | ICD-10-CM

## 2017-06-03 DIAGNOSIS — J40 Bronchitis, not specified as acute or chronic: Secondary | ICD-10-CM

## 2017-06-03 DIAGNOSIS — R0602 Shortness of breath: Secondary | ICD-10-CM

## 2017-06-03 LAB — COMPREHENSIVE METABOLIC PANEL
ALBUMIN: 3.2 g/dL — AB (ref 3.5–5.0)
ALK PHOS: 111 U/L (ref 38–126)
ALT: 25 U/L (ref 14–54)
AST: 29 U/L (ref 15–41)
Anion gap: 13 (ref 5–15)
BILIRUBIN TOTAL: 0.8 mg/dL (ref 0.3–1.2)
BUN: 29 mg/dL — AB (ref 6–20)
CALCIUM: 9.3 mg/dL (ref 8.9–10.3)
CO2: 22 mmol/L (ref 22–32)
Chloride: 105 mmol/L (ref 101–111)
Creatinine, Ser: 1.85 mg/dL — ABNORMAL HIGH (ref 0.44–1.00)
GFR calc Af Amer: 30 mL/min — ABNORMAL LOW (ref >60)
GFR calc non Af Amer: 26 mL/min — ABNORMAL LOW (ref >60)
GLUCOSE: 121 mg/dL — AB (ref 65–99)
Potassium: 3 mmol/L — ABNORMAL LOW (ref 3.5–5.1)
Sodium: 140 mmol/L (ref 135–145)
TOTAL PROTEIN: 6.8 g/dL (ref 6.5–8.1)

## 2017-06-03 LAB — CBC WITH DIFFERENTIAL/PLATELET
BASOS ABS: 0.1 10*3/uL (ref 0–0.1)
BASOS PCT: 1 %
EOS ABS: 0.2 10*3/uL (ref 0–0.7)
Eosinophils Relative: 2 %
HCT: 38.4 % (ref 35.0–47.0)
Hemoglobin: 13 g/dL (ref 12.0–16.0)
Lymphocytes Relative: 19 %
Lymphs Abs: 2.3 10*3/uL (ref 1.0–3.6)
MCH: 32.8 pg (ref 26.0–34.0)
MCHC: 33.8 g/dL (ref 32.0–36.0)
MCV: 96.9 fL (ref 80.0–100.0)
MONO ABS: 1 10*3/uL — AB (ref 0.2–0.9)
Monocytes Relative: 8 %
Neutro Abs: 8.8 10*3/uL — ABNORMAL HIGH (ref 1.4–6.5)
Neutrophils Relative %: 70 %
PLATELETS: 175 10*3/uL (ref 150–440)
RBC: 3.96 MIL/uL (ref 3.80–5.20)
RDW: 12.8 % (ref 11.5–14.5)
WBC: 12.4 10*3/uL — ABNORMAL HIGH (ref 3.6–11.0)

## 2017-06-03 LAB — URINALYSIS, COMPLETE (UACMP) WITH MICROSCOPIC
Bilirubin Urine: NEGATIVE
GLUCOSE, UA: NEGATIVE mg/dL
HGB URINE DIPSTICK: NEGATIVE
Ketones, ur: NEGATIVE mg/dL
NITRITE: NEGATIVE
Protein, ur: >=300 mg/dL — AB
SPECIFIC GRAVITY, URINE: 1.014 (ref 1.005–1.030)
pH: 6 (ref 5.0–8.0)

## 2017-06-03 LAB — TROPONIN I

## 2017-06-03 MED ORDER — HYDROCOD POLST-CPM POLST ER 10-8 MG/5ML PO SUER: 5.0000 mL | Freq: Two times a day (BID) | ORAL | 0 refills | Status: AC

## 2017-06-03 MED ORDER — METHYLPREDNISOLONE SODIUM SUCC 125 MG IJ SOLR
INTRAMUSCULAR | Status: AC
  Administered 2017-06-03: 125 mg
  Filled 2017-06-03: qty 2

## 2017-06-03 MED ORDER — IPRATROPIUM-ALBUTEROL 0.5-2.5 (3) MG/3ML IN SOLN: 3.0000 mL | Freq: Four times a day (QID) | RESPIRATORY_TRACT | 0 refills | Status: AC | PRN

## 2017-06-03 MED ORDER — AZITHROMYCIN 250 MG PO TABS: ORAL_TABLET | ORAL | 0 refills | Status: AC

## 2017-06-03 MED ORDER — IPRATROPIUM-ALBUTEROL 0.5-2.5 (3) MG/3ML IN SOLN
RESPIRATORY_TRACT | Status: AC
  Administered 2017-06-03: 3 mL
  Filled 2017-06-03: qty 3

## 2017-06-03 MED ORDER — BUTALBITAL-APAP-CAFFEINE 50-325-40 MG PO TABS
2.0000 | ORAL_TABLET | Freq: Once | ORAL | Status: AC
  Administered 2017-06-03: 2 via ORAL

## 2017-06-03 MED ORDER — IPRATROPIUM-ALBUTEROL 0.5-2.5 (3) MG/3ML IN SOLN
3.0000 mL | Freq: Once | RESPIRATORY_TRACT | Status: DC
  Filled 2017-06-03: qty 3

## 2017-06-03 MED ORDER — PREDNISONE 20 MG PO TABS: 60.0000 mg | ORAL_TABLET | Freq: Every day | ORAL | 0 refills | Status: AC

## 2017-06-03 MED ORDER — BUTALBITAL-APAP-CAFFEINE 50-325-40 MG PO TABS
ORAL_TABLET | ORAL | Status: AC
  Filled 2017-06-03: qty 2

## 2017-06-03 MED ORDER — POTASSIUM CHLORIDE CRYS ER 20 MEQ PO TBCR
40.0000 meq | EXTENDED_RELEASE_TABLET | Freq: Once | ORAL | Status: AC
  Administered 2017-06-03: 40 meq via ORAL
  Filled 2017-06-03: qty 2

## 2017-06-03 NOTE — ED Notes (Signed)
[name] called because did not have nebulizers at home and she would like prescription.  Dr Kerman Passey printed a prescription for [name] but [name] unable to return to hospital; verbal permission from Dr Kerman Passey to call prescription in. Called to [facility] in [place] per [name] request, [phone]. [name] aware of this.

## 2017-06-03 NOTE — ED Provider Notes (Addendum)
[facility] Emergency Department Provider Note   ____________________________________________   First MD Initiated Contact with Patient 06/03/17 [phone]     (approximate)  I have reviewed the triage vital signs and the nursing notes.   HISTORY  Chief Complaint Cough and Fever    HPI [name] is a 76 y.o. female Who comes into the hospital today after being sick on and off all week. The patient has a cough and is currently being treated for urinary tract infection. The patient is stressed as her husband has cancer. She is having shortness of breath with her cough. She had a cyst lanced on her left ear earlier this week and now her lymph nodes are swollen and she does have a sore throat. She states that she has postnasal drip and some sinuses. She is currently taking Macrobid for UTI. At 6 PM the patient went to take a shower and she developed some head pain and cough. She thinks she may have pneumonia. She had Legionella pneumonia 4 years ago and was intubated. The patient states that she had an episode of diarrhea last week but her son is also sick. She is having some clear productive sputum. The patient decided to come into the hospital today for evaluation.   Past Medical History:  Diagnosis Date  . Asthma 06/2010   depending on what around  . Blood transfusion 2009   back surgery  . Brain aneurysm    S/P RESECTION  . CHF (congestive heart failure) ([place]) 05/2010   after receiving Propofol  . Chronic sinusitis   . Complication of anesthesia 05/30/2010   from colonoscopy-had Propofol-had a  . Complication of anesthesia    reaction-went into asthma-pneumonia,-  . Complication of anesthesia    then CHF and to ICU  . Degenerative disc disease    HERNITAED DISC BY MRI ON 07/07;S/P LAMINECTOMY,RID REPLACEMENT  . Discoid lupus erythematosus eyelid    NEGATIVE SEROLOGIC MARKERS  . Dysrhythmia    PVC's occ.  Marland Kitchen Headache(784.0)    migraines occ.  Marland Kitchen  Hypercholesterolemia   . Hypertension   . Hypothyroidism    takes Synthroid  . Pneumonia 05/30/2010   after receiving Propofol  . Renal insufficiency    Patient states she has been told she has Stage I kidney disease  . Sleep apnea    uses CPAP-8.4-oxygen suppliment  . Systemic lupus erythematosus ([place]) 05/15/2012    Patient Active Problem List   Diagnosis Date Noted  . Abdominal pain, RLQ 09/16/2016  . Hypercalcemia 06/13/2016  . Proteinuria 06/13/2016  . 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 Epistaxis 04/10/2016  . Chest pain 03/31/2016  . Chronic narcotic dependence ([place]) 01/11/2016  . Pain in joint involving ankle and foot 09/07/2015  . Candidiasis of skin 09/06/2015  . Status post lumbar spinal fusion 06/01/2015  . Major depressive disorder with single episode, in remission ([place]) 06/01/2015  . Chronic rhinitis 05/24/2015  . CAP (community acquired pneumonia) 02/28/2015  . COPD exacerbation ([place]) 02/28/2015  . 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 CKD (chronic kidney disease) stage 3, GFR 30-59 ml/min (HCC) 01/07/2015  . Coronary atherosclerosis 01/05/2015  . Aortic atherosclerosis ([place]) 01/05/2015  . Routine history and physical examination of adult 08/22/2014  . Chronic mastoiditis of both sides 07/23/2014  . Stress incontinence 04/25/2014  . 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 Heart murmur 12/31/2013  . Bradycardia 12/31/2013  . Nocturnal hypoxia 10/09/2013  .  Sinusitis 10/09/2013  . Shortness of breath 09/23/2013  . History of pneumonia 09/01/2013  . History of tobacco abuse 09/01/2013  . Hypercholesterolemia 03/28/2013  . Preoperative clearance 03/28/2013  . Encounter for other preprocedural examination 03/28/2013  . Routine general medical examination at a health care facility 03/27/2013  . Obstructive sleep apnea syndrome 03/27/2013  . Insomnia secondary to chronic pain 03/13/2013  . Allergic  rhinitis, cause unspecified 03/13/2013  . Insomnia due to anxiety and fear 03/13/2013  . Asthma 01/27/2013  . 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 Congestive heart failure with left ventricular diastolic dysfunction ([place]) 05/15/2012  . Depressive disorder, not elsewhere classified 05/15/2012  . Hearing loss in left ear 05/15/2012  . Hypothyroidism, unspecified 05/15/2012  . Lupus erythematosus 05/15/2012  . Chronic low back pain 05/15/2012  . Chronic otitis media 05/15/2012  . Chronic otitis media of left ear 05/15/2012  . Chronic renal insufficiency, stage I 05/15/2012  . 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 Renal insufficiency 05/15/2012  . Adult body mass index greater than 30 03/10/2012  . Monoclonal gammopathy of unknown significance 03/10/2012  . Anemia 01/01/2012  . Hypothyroidism 01/01/2012  . Hx of total knee arthroplasty 10/08/2011  . Osteoarthritis of knee 07/12/2011  . Chronic sinusitis 06/01/2011  . Hearing deficit 06/01/2011  . Nonruptured cerebral aneurysm 04/18/2011  . Chronic back pain 04/18/2011  . Breast cancer screening 04/18/2011  . Encounter for other screening for malignant neoplasm of breast 04/18/2011  . Hypertension 04/07/2011  . Pure hypercholesterolemia 11/07/2007    Past Surgical History:  Procedure Laterality Date  . ABDOMINAL HYSTERECTOMY  1988   uterus only  . BACK SURGERY  1968,1988,2002,2009,2010   4 laminectomies and fusions  . BRAIN SURGERY  2005   coiling of cranial aneurysum  . BREAST EXCISIONAL BIOPSY Right 30+ YRS AGO   NEG  . COLONOSCOPY  2008  . COLONOSCOPY WITH PROPOFOL N/A 01/13/2015   Procedure: COLONOSCOPY WITH PROPOFOL;  Surgeon: Christene Lye, MD;  Location: [facility] ENDOSCOPY;  Service: Endoscopy;  Laterality: N/A;  . COLONOSCOPY WITH PROPOFOL N/A 03/24/2015   Procedure: COLONOSCOPY WITH PROPOFOL;  Surgeon: Christene Lye, MD;  Location: [facility] ENDOSCOPY;  Service: Endoscopy;  Laterality: N/A;  .  COSMETIC SURGERY    . [place]   left foot  . EYE SURGERY     both cataracts -[place] 7/14  . FOOT ARTHRODESIS Right 05/22/2013   Procedure: RIGHT HALLUX METATARSAL PHALANGEAL JOINT ARTHRODESIS ;  Surgeon: Wylene Simmer, MD;  Location: [place];  Service: Orthopedics;  Laterality: Right;  . JOINT REPLACEMENT  2013   Right knee,  [place]  . lipomas  1975   3 from right breast  . NASAL SINUS SURGERY  1977-2008   6 surgeries all together  . PERIPHERAL VASCULAR CATHETERIZATION N/A 01/25/2015   Procedure: Visceral Angiography;  Surgeon: Algernon Huxley, MD;  Location: [place] [facility];  Service: Cardiovascular;  Laterality: N/A;  . RIGHT HEART CATHETERIZATION N/A 11/28/2013   Procedure: RIGHT HEART CATH;  Surgeon: Jolaine Artist, MD;  [facility] C[facility];  Service: Cardiovascular;  Laterality: N/A;  . TONSILLECTOMY  1963  . TOTAL KNEE ARTHROPLASTY  07/10/2011   Procedure: TOTAL KNEE ARTHROPLASTY;  Surgeon: Gearlean Alf;  Location: [facility];  Service: Orthopedics;  Laterality: Right;  . TYMPANIC MEMBRANE REPAIR  2009-last one   x5 times    Prior to Admission medications   Medication Sig Start Date End Date Taking? Authorizing Provider  Ascorbic Acid (VITAMIN C) 1000 MG tablet Take 500 mg by mouth daily.     [provider]  B-D 3CC LUER-LOK SYR 23GX1" 23G X 1" 3 ML MISC PLEASE DISPENSE SO PATIENT CAN ADMINISTER B12 INJECTIONS MONTHLY 04/02/17   Crecencio Mc, MD  benzonatate (TESSALON) 200 MG capsule TAKE 1 CAPSULE (200 MG TOTAL) BY MOUTH 3 (THREE) TIMES DAILY AS NEEDED FOR COUGH. 01/22/17   Crecencio Mc, MD  CLARINEX 5 MG tablet TAKE 1 TABLET (5 MG TOTAL) BY MOUTH EVERY MORNING. 05/19/16   Crecencio Mc, MD  Coenzyme Q10 100 MG TABS Take 1 tablet by mouth daily.    [provider]  COZAAR 50 MG tablet TAKE 1 TABLET BY MOUTH TWICE A DAY 07/21/16   Crecencio Mc, MD  CRESTOR 20 MG tablet TAKE 1 TABLET (20 MG TOTAL) BY MOUTH DAILY.  01/16/17   Crecencio Mc, MD  cyanocobalamin (,VITAMIN B-12,) 1000 MCG/ML injection FOR USE WITH MONTHLY B12 INJECTIONS 12/27/16   Crecencio Mc, MD  diclofenac sodium (VOLTAREN) 1 % GEL APPLY 4 GRAMS TWICE A DAY 09/14/16   [provider]  doxepin (SINEQUAN) 25 MG capsule TAKE 2 CAPSULES (50 MG TOTAL) BY MOUTH AT BEDTIME. 04/16/17   Crecencio Mc, MD  escitalopram (LEXAPRO) 20 MG tablet TAKE 1 TABLET (10 MG TOTAL) BY MOUTH DAILY. 12/12/16   Crecencio Mc, MD  FeFum-FePo-FA-B Cmp-C-Zn-Mn-Cu (SE-TAN PLUS) 660-630.1-6 MG CAPS Take 1 capsule by mouth daily. 03/05/17   [provider]  FeFum-FePo-FA-B Cmp-C-Zn-Mn-Cu (SE-TAN PLUS) 162-115.2-1 MG CAPS TAKE 1 CAPSULE BY MOUTH DAILY. 05/14/17   Crecencio Mc, MD  fluticasone Asencion Islam) 50 MCG/ACT nasal spray  11/13/15   [provider]  isosorbide mononitrate (IMDUR) 60 MG 24 hr tablet TAKE 1 TABLET (60 MG TOTAL) BY MOUTH DAILY. 08/15/16   Crecencio Mc, MD  levothyroxine (SYNTHROID, LEVOTHROID) 137 MCG tablet Take 137 mcg by mouth daily before breakfast.    [provider]  LIDODERM 5 % PLACE 3 PATCHES ONTO SKIN DAILY AND DISCARD WITH 12 HOURS OR AS DIRECTED 05/16/17   Crecencio Mc, MD  montelukast (SINGULAIR) 10 MG tablet Take 1 tablet (10 mg total) by mouth at bedtime. 01/10/16   Crecencio Mc, MD  Multiple Vitamin (MULTI-VITAMINS) TABS Take 1 tablet by mouth daily.      [provider]  mupirocin ointment (BACTROBAN) 2 % Place 1 application into the nose 2 (two) times daily. 01/30/17   Burnard Hawthorne, FNP  NEEDLE, DISP, 23 G (BD DISP NEEDLE) 23G X 1" MISC Uses to inject b12 into muscle once monthly 07/24/13   Crecencio Mc, MD  nitroGLYCERIN (NITROSTAT) 0.4 MG SL tablet Place 1 tablet (0.4 mg total) under the tongue every 5 (five) minutes as needed for chest pain. 03/29/16   Crecencio Mc, MD  NORVASC 10 MG tablet TAKE 1 TABLET (10 MG TOTAL) BY MOUTH DAILY. 12/04/16   Crecencio Mc, MD  omega-3 acid  ethyl esters (LOVAZA) 1 g capsule TAKE 2 CAPSULES (2 G TOTAL) BY MOUTH 2 (TWO) TIMES DAILY. 07/24/16   Crecencio Mc, MD  oxyCODONE-acetaminophen (PERCOCET) 10-325 MG tablet Take 1 tablet by mouth every 8 (eight) hours as needed for pain. May refill on or after May 13 2017 03/12/17   Crecencio Mc, MD  Prenatal Vit-Fe Fumarate-FA (PRENATAL 1 PLUS 1 PO) Take 1 tablet by mouth daily.    [provider]  [place]  5000 DRY MOUTH 1.1 % GEL dental gel  09/28/15   [provider]  pyridOXINE (VITAMIN B-6) 100 MG tablet Take 100 mg by mouth daily.    [provider]  SINGULAIR 10 MG tablet TAKE 1 TABLET (10 MG TOTAL) BY MOUTH AT BEDTIME. 04/12/17   Crecencio Mc, MD  sodium chloride (OCEAN) 0.65 % nasal spray Place 2 sprays into the nose as needed. Allergies      [provider]  SYMBICORT 80-4.5 MCG/ACT inhaler INHALE 2 PUFFS INTO THE LUNGS 2 (TWO) TIMES DAILY. 03/26/17   Crecencio Mc, MD  SYNTHROID 137 MCG tablet TAKE 1 TABLET (137 MCG TOTAL) BY MOUTH DAILY BEFORE BREAKFAST. 03/22/17   Crecencio Mc, MD  tobramycin-dexamethasone [facility]) ophthalmic solution ADMINISTER 5 DROPS INTO THE LEFT EYE EVERY FOUR (4) HOURS WHILE AWAKE. FOR 10 DAYS 03/05/17   [provider]  VALIUM 5 MG tablet TAKE 1 TABLET BY MOUTH DAILY AS NEEDED FOR ANMXIETY 04/30/17   Crecencio Mc, MD  Vitamin D, Ergocalciferol, (DRISDOL) 50000 units CAPS capsule TAKE 1 CAPSULE (50,000 UNITS TOTAL) BY MOUTH EVERY 7 (SEVEN) DAYS. 12/25/16   Crecencio Mc, MD    Allergies Penicillins; Penicillins; Propofol; Propranolol; Triamcinolone acetonide; Adhesive [tape]; Kenalog [triamcinolone acetonide]; Septra [bactrim]; and Sulfur  Family History  Problem Relation Age of Onset  . Heart disease Mother   . Stroke Mother   . Alcohol abuse Other   . Cancer Sister        leukemia  . Cancer Brother        lung Ca, asbestos  . Breast cancer Paternal Aunt   . Prostate cancer Neg Hx   . Kidney  cancer Neg Hx   . Bladder Cancer Neg Hx     Social History Social History  Substance Use Topics  . Smoking status: Former Smoker    Packs/day: 0.50    Years: 40.00    Types: Cigarettes    Quit date: 07/24/2013  . Smokeless tobacco: Never Used     Comment: Has restarted use since husband was diagnosed with Cancer  . Alcohol use Yes     Comment: occasional    Review of Systems  Constitutional: No fever/chills Eyes: No visual changes. ENT: No sore throat. Cardiovascular: Denies chest pain. Respiratory: cough and shortness of breath. Gastrointestinal: No abdominal pain.  No nausea, no vomiting.  No diarrhea.  No constipation. Genitourinary: Negative for dysuria. Musculoskeletal: Negative for back pain. Skin: Negative for rash. Neurological: headache   ____________________________________________   PHYSICAL EXAM:  VITAL SIGNS: ED Triage Vitals  Enc Vitals Group     BP 06/02/17 2343 (!) 194/68     Pulse Rate 06/02/17 2342 86     Resp 06/02/17 2342 18     Temp 06/02/17 2342 98.9 F (37.2 C)     Temp Source 06/02/17 2342 Oral     SpO2 06/02/17 2342 93 %     Weight 06/02/17 2342 197 lb (89.4 kg)     Height 06/02/17 2342 5\' 2"  (1.575 m)     Head Circumference --      Peak Flow --      Pain Score 06/02/17 2342 7     Pain Loc --      Pain Edu? --      Excl. in [place]? --     Constitutional: Alert and oriented. Well appearing and in moderate distress. Eyes: Conjunctivae are normal. PERRL. EOMI. Head: Atraumatic. Nose: No congestion/rhinnorhea. Mouth/Throat: Mucous  membranes are moist.  Oropharynx non-erythematous. Cardiovascular: Normal rate, regular rhythm. Grossly normal heart sounds.  Good peripheral circulation. Respiratory: increased respiratory effort.  No retractions. Expiratory wheezes in all lung fields. Gastrointestinal: Soft and nontender. No distention. positive bowel sounds Musculoskeletal: No lower extremity tenderness nor edema.   Neurologic:  Normal  speech and language.  Skin:  Skin is warm, dry and intact.  Psychiatric: Mood and affect are normal.   ____________________________________________   LABS (all labs ordered are listed, but only abnormal results are displayed)  Labs Reviewed  COMPREHENSIVE METABOLIC PANEL - Abnormal; Notable for the following:       Result Value   Potassium 3.0 (*)    Glucose, Bld 121 (*)    BUN 29 (*)    Creatinine, Ser 1.85 (*)    Albumin 3.2 (*)    GFR calc non Af Amer 26 (*)    GFR calc Af Amer 30 (*)    All other components within normal limits  CBC WITH DIFFERENTIAL/PLATELET - Abnormal; Notable for the following:    WBC 12.4 (*)    Neutro Abs 8.8 (*)    Monocytes Absolute 1.0 (*)    All other components within normal limits  URINALYSIS, COMPLETE (UACMP) WITH MICROSCOPIC - Abnormal; Notable for the following:    Color, Urine YELLOW (*)    APPearance CLEAR (*)    Protein, ur >=300 (*)    Leukocytes, UA TRACE (*)    Bacteria, UA RARE (*)    Squamous Epithelial / LPF 0-5 (*)    All other components within normal limits  TROPONIN I   ____________________________________________  EKG  ED ECG REPORT I, Loney Hering, the attending physician, personally viewed and interpreted this ECG.   Date: 06/02/2017  EKG Time: 2358  Rate: 86  Rhythm: normal sinus rhythm  Axis: normal  Intervals:none  ST&T Change: none  ____________________________________________  RADIOLOGY  Dg Chest 2 View  Result Date: 06/03/2017 CLINICAL DATA:  Cough and fever, onset tonight. EXAM: CHEST  2 VIEW COMPARISON:  03/04/2015 FINDINGS: Mild interstitial coarsening. No airspace consolidation. No effusions. Borderline heart size, unchanged. Hilar and mediastinal contours are unremarkable and unchanged. IMPRESSION: Interstitial coarsening, worsened from 03/04/2015. This could be fibrotic, but cannot exclude a component of reversible interstitial fluid or infiltrate. No alveolar consolidation. No pleural  effusion. Electronically Signed   By: Andreas Newport M.D.   On: 06/03/2017 00:33    ____________________________________________   PROCEDURES  Procedure(s) performed: None  Procedures  Critical Care performed: No  ____________________________________________   INITIAL IMPRESSION / ASSESSMENT AND PLAN / ED COURSE  As part of my medical decision making, I reviewed the following data within the electronic MEDICAL RECORD NUMBER Notes from prior ED visits and [place] Controlled Substance Database   This is a 76 year old female who comes into the hospital today with a cough and shortness of breath. The patient is wheezing.  Differential diagnosis includes pneumonia, bronchitis, COPD exacerbation, upper respiratory infection   When the patient arrives to her room which did give her a DuoNeb treatment. She also received dose of Solu-Medrol. After some time she received a second dose of DuoNeb. I went back into check on the patient and she was speaking much easier and breathing easier. She does still have some mild wheezes in her bases so I will give the patient a third DuoNeb treatment. The patient's vital signs are unremarkable. Once I have given the patient her medication I will discharge her to follow-up  with her primary care physician. The patient has no further complaints or concerns at this time. She will also receive a dose of potassium chloride 40 MG every orally 1. I will treat the patient for bronchitis. Her urine is unremarkable and her white blood cell count is 12.4. The patient's chest x-ray does not show any signs of pneumonia     ____________________________________________   FINAL CLINICAL IMPRESSION(S) / ED DIAGNOSES  Final diagnoses:  Bronchitis  Cough  Shortness of breath      NEW MEDICATIONS STARTED DURING THIS VISIT:  New Prescriptions   No medications on file     Note:  This document was prepared using Dragon voice recognition software and may include  unintentional dictation errors.    Loney Hering, MD 06/03/17 [id number]    Loney Hering, MD 06/03/17 [phone]

## 2017-06-03 NOTE — ED Notes (Signed)
Pt seen and treated for UTI on Monday. Pt here tonight for fever and cough. Pt has a swollen gland on the left side of the neck, headache,  Pt is caring for her sick husband. Pt recently had a cyst in the left ear drained.

## 2017-06-07 ENCOUNTER — Encounter: Payer: Self-pay | Admitting: Internal Medicine

## 2017-06-08 ENCOUNTER — Telehealth: Payer: Self-pay | Admitting: Internal Medicine

## 2017-06-08 NOTE — Telephone Encounter (Signed)
Dr. Derrel Nip is aware. Placed death certificate in blue folder for her to sign per her request.

## 2017-06-08 NOTE — Telephone Encounter (Signed)
[employer] was called to let them know it was ready for pick-up

## 2017-06-08 NOTE — Telephone Encounter (Signed)
[employer] drooped off death certificate for pt to be filled out handed to [place] to give to Dr. Derrel Nip. Please advise April at [phone] when completed

## 2017-06-08 NOTE — Telephone Encounter (Signed)
Death certificate signed and given to [place] up front.

## 2017-06-11 ENCOUNTER — Ambulatory Visit: Payer: Federal, State, Local not specified - PPO | Admitting: Internal Medicine

## 2017-06-12 ENCOUNTER — Ambulatory Visit: Payer: Federal, State, Local not specified - PPO | Admitting: Internal Medicine

## 2017-06-12 NOTE — Telephone Encounter (Signed)
Death certificate was picked up

## 2017-06-19 ENCOUNTER — Other Ambulatory Visit: Payer: Self-pay | Admitting: Internal Medicine

## 2017-06-21 DEATH — deceased

## 2017-06-26 ENCOUNTER — Ambulatory Visit (INDEPENDENT_AMBULATORY_CARE_PROVIDER_SITE_OTHER): Payer: Federal, State, Local not specified - PPO | Admitting: Vascular Surgery

## 2017-06-26 ENCOUNTER — Encounter (INDEPENDENT_AMBULATORY_CARE_PROVIDER_SITE_OTHER): Payer: Federal, State, Local not specified - PPO

## 2017-07-11 ENCOUNTER — Other Ambulatory Visit: Payer: Self-pay

## 2017-07-11 MED ORDER — SINGULAIR 10 MG PO TABS: 10.0000 mg | ORAL_TABLET | Freq: Every day | ORAL | 1 refills | Status: AC

## 2017-07-17 ENCOUNTER — Ambulatory Visit: Payer: Federal, State, Local not specified - PPO | Admitting: Oncology

## 2017-07-17 ENCOUNTER — Other Ambulatory Visit: Payer: Federal, State, Local not specified - PPO

## 2022-08-01 NOTE — Progress Notes (Signed)
This encounter was created in error - please disregard.
